# Patient Record
Sex: Female | Born: 1935 | Race: White | Hispanic: No | Smoking: Never smoker
Health system: Southern US, Community
[De-identification: ages and names within clinical notes are randomized; demographics above are authoritative.]

## PROBLEM LIST (undated history)

## (undated) DIAGNOSIS — I471 Supraventricular tachycardia, unspecified: Secondary | ICD-10-CM

## (undated) DIAGNOSIS — R32 Unspecified urinary incontinence: Secondary | ICD-10-CM

## (undated) DIAGNOSIS — I499 Cardiac arrhythmia, unspecified: Secondary | ICD-10-CM

## (undated) DIAGNOSIS — K224 Dyskinesia of esophagus: Secondary | ICD-10-CM

## (undated) DIAGNOSIS — I341 Nonrheumatic mitral (valve) prolapse: Secondary | ICD-10-CM

## (undated) DIAGNOSIS — H269 Unspecified cataract: Secondary | ICD-10-CM

## (undated) DIAGNOSIS — T8859XA Other complications of anesthesia, initial encounter: Secondary | ICD-10-CM

## (undated) DIAGNOSIS — K222 Esophageal obstruction: Secondary | ICD-10-CM

## (undated) DIAGNOSIS — T4145XA Adverse effect of unspecified anesthetic, initial encounter: Secondary | ICD-10-CM

## (undated) DIAGNOSIS — I493 Ventricular premature depolarization: Secondary | ICD-10-CM

## (undated) DIAGNOSIS — K56609 Unspecified intestinal obstruction, unspecified as to partial versus complete obstruction: Secondary | ICD-10-CM

## (undated) DIAGNOSIS — M810 Age-related osteoporosis without current pathological fracture: Secondary | ICD-10-CM

## (undated) DIAGNOSIS — D351 Benign neoplasm of parathyroid gland: Secondary | ICD-10-CM

## (undated) DIAGNOSIS — I4891 Unspecified atrial fibrillation: Secondary | ICD-10-CM

## (undated) DIAGNOSIS — K229 Disease of esophagus, unspecified: Secondary | ICD-10-CM

## (undated) DIAGNOSIS — D049 Carcinoma in situ of skin, unspecified: Secondary | ICD-10-CM

## (undated) DIAGNOSIS — M1990 Unspecified osteoarthritis, unspecified site: Secondary | ICD-10-CM

## (undated) DIAGNOSIS — E785 Hyperlipidemia, unspecified: Secondary | ICD-10-CM

## (undated) DIAGNOSIS — J189 Pneumonia, unspecified organism: Secondary | ICD-10-CM

## (undated) HISTORY — DX: Benign neoplasm of parathyroid gland: D35.1

## (undated) HISTORY — DX: Unspecified osteoarthritis, unspecified site: M19.90

## (undated) HISTORY — DX: Nonrheumatic mitral (valve) prolapse: I34.1

## (undated) HISTORY — DX: Unspecified atrial fibrillation: I48.91

## (undated) HISTORY — PX: OTHER SURGICAL HISTORY: SHX169

## (undated) HISTORY — DX: Unspecified intestinal obstruction, unspecified as to partial versus complete obstruction: K56.609

## (undated) HISTORY — DX: Hyperlipidemia, unspecified: E78.5

## (undated) HISTORY — DX: Unspecified urinary incontinence: R32

## (undated) HISTORY — DX: Carcinoma in situ of skin, unspecified: D04.9

## (undated) HISTORY — DX: Ventricular premature depolarization: I49.3

## (undated) HISTORY — DX: Supraventricular tachycardia, unspecified: I47.10

## (undated) HISTORY — DX: Unspecified cataract: H26.9

## (undated) HISTORY — DX: Esophageal obstruction: K22.2

## (undated) HISTORY — PX: CATARACT EXTRACTION: SUR2

## (undated) HISTORY — DX: Dyskinesia of esophagus: K22.4

## (undated) HISTORY — DX: Age-related osteoporosis without current pathological fracture: M81.0

## (undated) HISTORY — DX: Supraventricular tachycardia: I47.1

## (undated) HISTORY — DX: Disease of esophagus, unspecified: K22.9

## (undated) HISTORY — PX: EYE SURGERY: SHX253

---
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
## 1977-11-06 HISTORY — PX: BREAST BIOPSY: SHX20

## 1987-11-07 HISTORY — PX: ABDOMINAL HYSTERECTOMY: SHX81

## 1998-04-30 ENCOUNTER — Other Ambulatory Visit: Admission: RE | Admit: 1998-04-30 | Discharge: 1998-04-30 | Payer: Self-pay | Admitting: Obstetrics and Gynecology

## 1998-05-15 ENCOUNTER — Emergency Department (HOSPITAL_COMMUNITY): Admission: EM | Admit: 1998-05-15 | Discharge: 1998-05-15 | Payer: Self-pay | Admitting: Emergency Medicine

## 1998-11-06 DIAGNOSIS — K56609 Unspecified intestinal obstruction, unspecified as to partial versus complete obstruction: Secondary | ICD-10-CM

## 1998-11-06 HISTORY — PX: LAPAROSCOPIC SMALL BOWEL RESECTION: SUR793

## 1998-11-06 HISTORY — DX: Unspecified intestinal obstruction, unspecified as to partial versus complete obstruction: K56.609

## 1999-01-12 ENCOUNTER — Encounter: Admission: RE | Admit: 1999-01-12 | Discharge: 1999-02-24 | Payer: Self-pay | Admitting: Orthopedic Surgery

## 1999-02-05 HISTORY — PX: SHOULDER SURGERY: SHX246

## 1999-02-16 ENCOUNTER — Ambulatory Visit (HOSPITAL_BASED_OUTPATIENT_CLINIC_OR_DEPARTMENT_OTHER): Admission: RE | Admit: 1999-02-16 | Discharge: 1999-02-16 | Payer: Self-pay | Admitting: Orthopedic Surgery

## 1999-02-24 ENCOUNTER — Encounter: Admission: RE | Admit: 1999-02-24 | Discharge: 1999-04-29 | Payer: Self-pay | Admitting: Orthopedic Surgery

## 1999-06-10 ENCOUNTER — Emergency Department (HOSPITAL_COMMUNITY): Admission: EM | Admit: 1999-06-10 | Discharge: 1999-06-10 | Payer: Self-pay | Admitting: Emergency Medicine

## 1999-06-10 ENCOUNTER — Encounter: Payer: Self-pay | Admitting: Orthopedic Surgery

## 1999-08-07 HISTORY — PX: SKIN LESION EXCISION: SHX2412

## 1999-11-19 ENCOUNTER — Encounter: Payer: Self-pay | Admitting: Emergency Medicine

## 1999-11-20 ENCOUNTER — Inpatient Hospital Stay (HOSPITAL_COMMUNITY): Admission: EM | Admit: 1999-11-20 | Discharge: 1999-11-23 | Payer: Self-pay | Admitting: Emergency Medicine

## 2000-01-17 ENCOUNTER — Encounter: Payer: Self-pay | Admitting: Cardiology

## 2000-01-17 ENCOUNTER — Encounter: Admission: RE | Admit: 2000-01-17 | Discharge: 2000-01-17 | Payer: Self-pay | Admitting: Cardiology

## 2000-02-06 ENCOUNTER — Other Ambulatory Visit: Admission: RE | Admit: 2000-02-06 | Discharge: 2000-02-06 | Payer: Self-pay | Admitting: Obstetrics and Gynecology

## 2000-07-19 ENCOUNTER — Encounter: Admission: RE | Admit: 2000-07-19 | Discharge: 2000-07-19 | Payer: Self-pay | Admitting: Cardiology

## 2000-07-19 ENCOUNTER — Encounter: Payer: Self-pay | Admitting: Cardiology

## 2001-07-30 ENCOUNTER — Encounter: Admission: RE | Admit: 2001-07-30 | Discharge: 2001-07-30 | Payer: Self-pay | Admitting: Cardiology

## 2001-07-30 ENCOUNTER — Encounter: Payer: Self-pay | Admitting: Cardiology

## 2001-08-01 ENCOUNTER — Encounter: Payer: Self-pay | Admitting: Cardiology

## 2001-08-01 ENCOUNTER — Encounter: Admission: RE | Admit: 2001-08-01 | Discharge: 2001-08-01 | Payer: Self-pay | Admitting: Cardiology

## 2002-01-27 ENCOUNTER — Encounter: Payer: Self-pay | Admitting: Cardiology

## 2002-01-27 ENCOUNTER — Encounter: Admission: RE | Admit: 2002-01-27 | Discharge: 2002-01-27 | Payer: Self-pay | Admitting: Cardiology

## 2004-11-06 LAB — HM COLONOSCOPY

## 2005-04-13 ENCOUNTER — Encounter: Admission: RE | Admit: 2005-04-13 | Discharge: 2005-04-13 | Payer: Self-pay | Admitting: Gastroenterology

## 2005-06-21 ENCOUNTER — Encounter: Admission: RE | Admit: 2005-06-21 | Discharge: 2005-06-21 | Payer: Self-pay | Admitting: Gastroenterology

## 2005-10-26 ENCOUNTER — Encounter: Admission: RE | Admit: 2005-10-26 | Discharge: 2005-10-26 | Payer: Self-pay | Admitting: Cardiology

## 2007-06-03 ENCOUNTER — Encounter: Admission: RE | Admit: 2007-06-03 | Discharge: 2007-06-03 | Payer: Self-pay | Admitting: Cardiology

## 2007-07-01 ENCOUNTER — Encounter: Admission: RE | Admit: 2007-07-01 | Discharge: 2007-07-01 | Payer: Self-pay | Admitting: Cardiology

## 2007-08-08 ENCOUNTER — Ambulatory Visit (HOSPITAL_COMMUNITY): Admission: RE | Admit: 2007-08-08 | Discharge: 2007-08-09 | Payer: Self-pay | Admitting: *Deleted

## 2007-08-08 ENCOUNTER — Encounter (INDEPENDENT_AMBULATORY_CARE_PROVIDER_SITE_OTHER): Payer: Self-pay | Admitting: *Deleted

## 2008-07-21 ENCOUNTER — Encounter: Admission: RE | Admit: 2008-07-21 | Discharge: 2008-07-21 | Payer: Self-pay | Admitting: Cardiology

## 2009-07-27 ENCOUNTER — Encounter: Admission: RE | Admit: 2009-07-27 | Discharge: 2009-07-27 | Payer: Self-pay | Admitting: Cardiology

## 2010-05-20 ENCOUNTER — Encounter: Admission: RE | Admit: 2010-05-20 | Discharge: 2010-05-20 | Payer: Self-pay | Admitting: Cardiology

## 2010-08-18 ENCOUNTER — Encounter: Admission: RE | Admit: 2010-08-18 | Discharge: 2010-08-18 | Payer: Self-pay | Admitting: Cardiology

## 2010-11-06 LAB — HM MAMMOGRAPHY

## 2011-03-21 NOTE — Op Note (Signed)
Michelle Vazquez, Michelle Vazquez                 ACCOUNT NO.:  [id number]   MEDICAL RECORD NO.:  [id number]          PATIENT TYPE:  AMB   LOCATION:  SDS                          FACILITY:  [facility]   PHYSICIAN:  Alfonse Ras, MD   DATE OF BIRTH:  09-08-1929   DATE OF PROCEDURE:  08/08/2007  DATE OF DISCHARGE:                               OPERATIVE REPORT   PREOPERATIVE DIAGNOSIS:  Left inferior parathyroid adenoma.   POSTOPERATIVE DIAGNOSIS:  Left inferior parathyroid adenoma.   PROCEDURE:  Left neck exploration, excision of thyroid nodule, and left  parathyroidectomy.   ANESTHESIA:  General.   SURGEON:  Alfonse Ras, M.D.   ASSISTANT:  Velora Heckler, M.D.   DESCRIPTION:  The patient was taken to the operating room, placed in  supine position.  After nuclear medicine had been injected, the left  neck was prepped and draped in a normal sterile fashion and the neck was  hyperextended.  Using a left lateral low collar incision extending from  the midline laterally, I dissected down to the platysma muscle.  Flaps  were created both superiorly and inferiorly.  Strap muscles were then  opened at the midline.  The thyroid lobe was mobilized.  Using the  Neoprobe, the only area of high activity that we found was in the lower  inferior aspect of the left pole of the thyroid.  It appeared that there  was a small nodule there and it was excised and sent for frozen section,  which showed that to be thyroid tissue.  On further evaluation in the  cervical esophageal groove, very small parathyroid was identified and  was excised using blunt dissection.  Frozen section of this was  consistent with parathyroid adenoma.  Surgicel was placed in the area of  resection.  Recurrent laryngeal nerve was identified and spared.  The  strap muscles were closed with interrupted 3-0 Vicryl sutures.  The  platysma muscle was also closed with interrupted 3-0 Vicryl sutures.  Skin incisions were closed with staples.   Sterile dressing was applied.  The patient tolerated the procedure well and went to PACU in good  condition.      Alfonse Ras, MD  Electronically Signed     KRE/MEDQ  D:  08/08/2007  T:  08/08/2007  Job:  [id number]   cc:   Cassell Clement, M.D.

## 2011-03-24 NOTE — Op Note (Signed)
[name]  Patient:    Michelle Vazquez                         MRN: [id number] Proc. Date: 11/20/99 Adm. Date:  [id number] Attending:  Stephenie Acres                           Operative Report  PREOPERATIVE DIAGNOSIS:  Complete small-bowel obstruction.  POSTOPERATIVE DIAGNOSIS:  Complete small-bowel obstruction, single-band adhesion.  OPERATION:  Laparotomy with lysis of adhesions.  SURGEON:  Catalina Lunger, M.D.  ANESTHESIA:  General.  DESCRIPTION OF PROCEDURE:  The patient was taken to the operating room and placed in a supine position.  After adequate anesthesia was induced using endotracheal  tube, the abdomen was prepped and draped in normal sterile fashion.  Using a small vertical midline incision, I dissected down to the fascia.  The fascia was opened vertically.  On entering the peritoneum, a moderate amount of ascites was withdrawn.  A very dilated small bowel was identified with a transition point in the right lower quadrant.  There was a single-band adhesion to the retroperitoneum. This was lysed and the transition point brought up through the wound.  This was the single transition point.  The remainder of the small bowel was run.  No evidence of adhesive disease was found.  The fascia was closed with a running #1 Novofil, skin was closed with staples.  The patient tolerated the procedure well and went to ACU in good condition. DD:  11/20/99 TD:  11/20/99 Job: [id number]

## 2011-04-21 ENCOUNTER — Other Ambulatory Visit: Payer: Self-pay | Admitting: *Deleted

## 2011-06-13 ENCOUNTER — Encounter: Payer: Self-pay | Admitting: *Deleted

## 2011-06-21 ENCOUNTER — Encounter: Payer: Self-pay | Admitting: Cardiology

## 2011-06-21 ENCOUNTER — Ambulatory Visit (INDEPENDENT_AMBULATORY_CARE_PROVIDER_SITE_OTHER): Payer: Medicare Other | Admitting: *Deleted

## 2011-06-21 ENCOUNTER — Ambulatory Visit (INDEPENDENT_AMBULATORY_CARE_PROVIDER_SITE_OTHER): Payer: Medicare Other | Admitting: Cardiology

## 2011-06-21 VITALS — BP 130/80 | HR 85 | Wt 105.0 lb

## 2011-06-21 DIAGNOSIS — R1310 Dysphagia, unspecified: Secondary | ICD-10-CM | POA: Insufficient documentation

## 2011-06-21 DIAGNOSIS — Z79899 Other long term (current) drug therapy: Secondary | ICD-10-CM

## 2011-06-21 DIAGNOSIS — D351 Benign neoplasm of parathyroid gland: Secondary | ICD-10-CM

## 2011-06-21 DIAGNOSIS — I4949 Other premature depolarization: Secondary | ICD-10-CM

## 2011-06-21 DIAGNOSIS — I059 Rheumatic mitral valve disease, unspecified: Secondary | ICD-10-CM

## 2011-06-21 DIAGNOSIS — E785 Hyperlipidemia, unspecified: Secondary | ICD-10-CM

## 2011-06-21 DIAGNOSIS — I493 Ventricular premature depolarization: Secondary | ICD-10-CM | POA: Insufficient documentation

## 2011-06-21 DIAGNOSIS — Z86018 Personal history of other benign neoplasm: Secondary | ICD-10-CM | POA: Insufficient documentation

## 2011-06-21 DIAGNOSIS — I341 Nonrheumatic mitral (valve) prolapse: Secondary | ICD-10-CM

## 2011-06-21 DIAGNOSIS — R5383 Other fatigue: Secondary | ICD-10-CM

## 2011-06-21 LAB — CBC WITH DIFFERENTIAL/PLATELET
Eosinophils Absolute: 0.3 10*3/uL (ref 0.0–0.7)
Eosinophils Relative: 5.1 % — ABNORMAL HIGH (ref 0.0–5.0)
HCT: 42.3 % (ref 36.0–46.0)
Lymphocytes Relative: 46 % (ref 12.0–46.0)
MCHC: 33.3 g/dL (ref 30.0–36.0)
MCV: 94.2 fl (ref 78.0–100.0)
Monocytes Relative: 8.7 % (ref 3.0–12.0)
Neutrophils Relative %: 39.2 % — ABNORMAL LOW (ref 43.0–77.0)
Platelets: 262 10*3/uL (ref 150.0–400.0)
RDW: 13.5 % (ref 11.5–14.6)

## 2011-06-21 LAB — LIPID PANEL
HDL: 76.4 mg/dL (ref >39.00)
Total CHOL/HDL Ratio: 3
Triglycerides: 64 mg/dL (ref 0.0–149.0)

## 2011-06-21 LAB — BASIC METABOLIC PANEL
Calcium: 9.2 mg/dL (ref 8.4–10.5)
GFR: 67.54 mL/min (ref >60.00)
Potassium: 4.8 mEq/L (ref 3.5–5.1)

## 2011-06-21 LAB — HEPATIC FUNCTION PANEL
ALT: 14 U/L (ref 0–35)
AST: 21 U/L (ref 0–37)
Alkaline Phosphatase: 49 U/L (ref 39–117)
Bilirubin, Direct: 0.1 mg/dL (ref 0.0–0.3)
Total Bilirubin: 0.9 mg/dL (ref 0.3–1.2)
Total Protein: 7.2 g/dL (ref 6.0–8.3)

## 2011-06-21 NOTE — Patient Instructions (Signed)
We will schedule Ba swallow and Chest xray

## 2011-06-21 NOTE — Assessment & Plan Note (Signed)
The patient has a long history of unifocal PVCs.  Today she is demonstrating PVCs in quadrigeminy.  This is similar to one year ago.  She is aware of the premature beats.  She has not been having a sustained tachycardia.  She's had no dizziness or syncope.  She has not been expressing any chest pain.

## 2011-06-21 NOTE — Assessment & Plan Note (Signed)
The patient has a history of difficulty swallowing.  She has had these symptoms off and on dating back to 2003.  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  There was no evidence of any stricture at that time.

## 2011-06-21 NOTE — Assessment & Plan Note (Signed)
The patient has a history of mitral valve prolapse.  She has not been expressing any symptoms of congestive heart failure.  She does have mild mitral regurgitation.

## 2011-06-21 NOTE — Progress Notes (Signed)
Dominic Pea Date of Birth:  [birth year] [facility] [address]   [address] [place], [place]  [address] [phone]           Fax   [phone]  History of Present Illness: This pleasant 77 year old Caucasian female is seen for a one year followup office visit.  She has a past history of mitral valve prolapse.  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  In 2008 she underwent parathyroidectomy for parathyroid adenoma.  Subsequent serum calcium levels have been normal she has not been expressing any chest pain or angina.  She does not have any history of ischemic heart disease and she had a normal nuclear stress test on 06/19/07 which showed no evidence of ischemia and her ejection fraction was 46%.  Her last echocardiogram was 03/17/09 showed an ejection fraction of 55-60% and showed normal left ventricular systolic function with diastolic dysfunction, mild aortic sclerosis, mitral valve prolapse with mild mitral regurgitation, and normal pulmonary artery pressure.  The patient has a past history of difficulty swallowing.  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  2006 she saw Dr. Randa Evens who repeated the time swallowing and also did a colonoscopy.  The colonoscopy was incomplete because of strictures and diverticulitis.  Recently the patient is started to have problems with difficulty swallowing again.  She requests another barium swallow at [facility].  Current Outpatient Prescriptions  Medication Sig Dispense Refill  . calcium gluconate 500 MG tablet Take 500 mg by mouth daily.        . metoprolol succinate (TOPROL-XL) 25 MG 24 hr tablet Take 25 mg by mouth daily.        . multivitamin (THERAGRAN) per tablet Take 1 tablet by mouth daily.          Allergies  Allergen Reactions  . Inderal (Propranolol Hcl)   . Lopressor (Metoprolol Tartrate)   . Penicillins     There is no problem list on file for this patient.   History  Smoking  status  . Never Smoker   Smokeless tobacco  . Never Used    History  Alcohol Use No    Family History  Problem Relation Age of Onset  . Arthritis Father   . Heart disease Father   . Hypertension Father   . Stroke Father   . Heart attack Father   . Arthritis Mother   . Stroke Mother   . Stroke Brother   . Diabetes Brother   . Cancer Brother   . Hypertension Brother   . Cancer Sister   . Hypertension Sister   . Hypertension Sister     Review of Systems: Constitutional: no fever chills diaphoresis or fatigue or change in weight.  Head and neck: no hearing loss, no epistaxis, no photophobia or visual disturbance. Respiratory: No cough, shortness of breath or wheezing. Cardiovascular: No chest pain peripheral edema, palpitations. Gastrointestinal: No abdominal distention, no abdominal pain, no change in bowel habits hematochezia or melena. Genitourinary: No dysuria, no frequency, no urgency, no nocturia. Musculoskeletal:No arthralgias, no back pain, no gait disturbance or myalgias. Neurological: No dizziness, no headaches, no numbness, no seizures, no syncope, no weakness, no tremors. Hematologic: No lymphadenopathy, no easy bruising. Psychiatric: No confusion, no hallucinations, no sleep disturbance.    Physical Exam: Filed Vitals:   06/21/11 0931  BP: 130/80  Pulse: 85  The general appearance reveals a thin woman in no acute distress.Pupils equal and reactive.  Extraocular Movements are full.  There is no scleral icterus.  The mouth and pharynx are normal.  The neck is supple.  The carotids reveal no bruits.  The jugular venous pressure is normal.  The thyroid is not enlarged.  There is no lymphadenopathy.  The chest is clear to percussion and auscultation. There are no rales or rhonchi. Expansion of the chest is symmetrical.    The heart reveals a soft apical systolic murmur.  The rhythm is irregular because of PVCs.  The breasts reveal no masses. The abdomen is  soft and nontender. Bowel sounds are normal. The liver and spleen are not enlarged. There Are no abdominal masses. There are no bruits.Rectal exam reveals brown stool benzidine negative.The pedal pulses are good.  There is no phlebitis or edema.  There is no cyanosis or clubbing.  Strength is normal and symmetrical in all extremities.  There is no lateralizing weakness.  There are no sensory deficits.  The skin is warm and dry.  There is no rash.    EKG today shows normal sinus rhythm with frequent PVCs in quadrigeminy.  Otherwise the tracing is normal with poor R-wave progression V1 through V3.  No change from prior tracings.     Assessment / Plan:  Her PVCs do not require any specific therapy.  We're checking lab work today to rule out hypokalemia et [employer]  We will arrange for her to have a chest x-ray and a barium swallow it [facility] imaging.  Continue same medications.  Recheck in one year for office visit and fasting lab work including lipid panel hepatic function panel basal metabolic panel and CBC.We're also asked her to find herself a primary care provider But we assured her that we would continue to look after her heart for her

## 2011-06-21 NOTE — Assessment & Plan Note (Signed)
Patient has a history of a parathyroid adenoma which was removed in 2008.  We're checking serum calcium levels on her today.  She has not had any symptoms referable to the parathyroid surgery.

## 2011-06-22 ENCOUNTER — Encounter: Payer: Self-pay | Admitting: Cardiology

## 2011-06-22 ENCOUNTER — Telehealth: Payer: Self-pay | Admitting: *Deleted

## 2011-06-22 NOTE — Telephone Encounter (Signed)
Recheck fasting lipid panel in 3 months

## 2011-06-22 NOTE — Telephone Encounter (Signed)
Patient would like to work on diet re-her sweets before starting any medicines. When to re-check lab?

## 2011-06-22 NOTE — Telephone Encounter (Signed)
Message copied by Eugenia Pancoast on Thu Jun 22, 2011  4:55 PM ------      Message from: Cassell Clement      Created: Wed Jun 21, 2011  8:07 PM       CBC normal.  LDL cholesterol 124 too high.Liver and kidneys are nl.      Stay on prudent diet.  Add generic Lipitor 20 mg daily to bring down dholesterol..Recheck LP and HFP 2 Mo

## 2011-06-23 ENCOUNTER — Telehealth: Payer: Self-pay | Admitting: *Deleted

## 2011-06-23 NOTE — Telephone Encounter (Signed)
Advised spouse to have patient call to resch. Lab work in three months

## 2011-06-26 ENCOUNTER — Ambulatory Visit
Admission: RE | Admit: 2011-06-26 | Discharge: 2011-06-26 | Disposition: A | Discharge status: Home / Self Care | Payer: PRIVATE HEALTH INSURANCE | Source: Ambulatory Visit | Attending: Cardiology | Admitting: Cardiology

## 2011-06-26 ENCOUNTER — Ambulatory Visit
Admission: RE | Admit: 2011-06-26 | Discharge: 2011-06-26 | Disposition: A | Discharge status: Home / Self Care | Payer: Medicare Other | Source: Ambulatory Visit | Attending: Cardiology | Admitting: Cardiology

## 2011-06-26 DIAGNOSIS — R1310 Dysphagia, unspecified: Secondary | ICD-10-CM

## 2011-06-26 DIAGNOSIS — I493 Ventricular premature depolarization: Secondary | ICD-10-CM

## 2011-06-27 ENCOUNTER — Telehealth: Payer: Self-pay | Admitting: *Deleted

## 2011-06-27 DIAGNOSIS — R002 Palpitations: Secondary | ICD-10-CM

## 2011-06-27 MED ORDER — METOPROLOL SUCCINATE ER 25 MG PO TB24: 25.0000 mg | ORAL_TABLET | Freq: Every day | ORAL | Status: DC

## 2011-06-27 NOTE — Telephone Encounter (Signed)
Message copied by Burnell Blanks on Tue Jun 27, 2011  1:55 PM ------      Message from: Cassell Clement      Created: Tue Jun 27, 2011 10:12 AM       Please report.  The chest x-ray is normal.The heart size is upper limits of normal.  The lungs are clear.

## 2011-06-27 NOTE — Progress Notes (Signed)
Advised of results

## 2011-06-27 NOTE — Progress Notes (Signed)
Advised of results and will refer

## 2011-06-29 ENCOUNTER — Telehealth: Payer: Self-pay | Admitting: Cardiology

## 2011-06-29 NOTE — Telephone Encounter (Signed)
Wrote up Rx for shingles vaccine

## 2011-06-29 NOTE — Telephone Encounter (Signed)
Advised of xray and barium swallow.  Order will be faxed to outpatient for therapy to be scheduled

## 2011-06-29 NOTE — Telephone Encounter (Signed)
Pt states she needs a script for a "shingles shot" for [facility].  Please call pt back for further details.  Chart in box

## 2011-07-18 ENCOUNTER — Ambulatory Visit: Payer: Medicare Other | Attending: Cardiology | Admitting: *Deleted

## 2011-07-18 DIAGNOSIS — IMO0001 Reserved for inherently not codable concepts without codable children: Secondary | ICD-10-CM | POA: Insufficient documentation

## 2011-07-18 DIAGNOSIS — R1310 Dysphagia, unspecified: Secondary | ICD-10-CM | POA: Insufficient documentation

## 2011-08-17 LAB — BASIC METABOLIC PANEL
BUN: 12
Calcium: 10.5
Chloride: 105
Creatinine, Ser: 0.88
GFR calc Af Amer: >60
GFR calc non Af Amer: >60
Glucose, Bld: 105 — ABNORMAL HIGH
Potassium: 4.4

## 2011-08-17 LAB — CBC
Hemoglobin: 13
MCHC: 33.8
MCV: 93.1
RBC: 4.14
RDW: 13.1

## 2011-08-17 LAB — CALCIUM: Calcium: 8.5

## 2011-08-30 ENCOUNTER — Other Ambulatory Visit: Payer: Self-pay | Admitting: Cardiology

## 2011-08-30 DIAGNOSIS — Z1231 Encounter for screening mammogram for malignant neoplasm of breast: Secondary | ICD-10-CM

## 2011-09-19 ENCOUNTER — Ambulatory Visit
Admission: RE | Admit: 2011-09-19 | Discharge: 2011-09-19 | Disposition: A | Discharge status: Home / Self Care | Payer: Medicare Other | Source: Ambulatory Visit | Attending: Cardiology | Admitting: Cardiology

## 2011-09-19 DIAGNOSIS — Z1231 Encounter for screening mammogram for malignant neoplasm of breast: Secondary | ICD-10-CM

## 2012-02-14 ENCOUNTER — Emergency Department (HOSPITAL_COMMUNITY)
Admission: EM | Admit: 2012-02-14 | Discharge: 2012-02-14 | Disposition: A | Discharge status: Home / Self Care | Payer: Medicare Other | Attending: Emergency Medicine | Admitting: Emergency Medicine

## 2012-02-14 ENCOUNTER — Encounter (HOSPITAL_COMMUNITY): Payer: Self-pay | Admitting: Emergency Medicine

## 2012-02-14 DIAGNOSIS — Y92009 Unspecified place in unspecified non-institutional (private) residence as the place of occurrence of the external cause: Secondary | ICD-10-CM | POA: Insufficient documentation

## 2012-02-14 DIAGNOSIS — W540XXA Bitten by dog, initial encounter: Secondary | ICD-10-CM

## 2012-02-14 DIAGNOSIS — S51809A Unspecified open wound of unspecified forearm, initial encounter: Secondary | ICD-10-CM | POA: Diagnosis not present

## 2012-02-14 DIAGNOSIS — S61409A Unspecified open wound of unspecified hand, initial encounter: Secondary | ICD-10-CM | POA: Diagnosis not present

## 2012-02-14 MED ORDER — CIPROFLOXACIN HCL 500 MG PO TABS
500.0000 mg | ORAL_TABLET | Freq: Once | ORAL | Status: AC
  Administered 2012-02-14: 500 mg via ORAL
  Filled 2012-02-14: qty 1

## 2012-02-14 MED ORDER — BACITRACIN ZINC 500 UNIT/GM EX OINT
1.0000 "application " | TOPICAL_OINTMENT | Freq: Once | CUTANEOUS | Status: AC
  Administered 2012-02-14: 4 via TOPICAL
  Filled 2012-02-14: qty 0.9

## 2012-02-14 MED ORDER — CIPROFLOXACIN HCL 500 MG PO TABS: 500.0000 mg | ORAL_TABLET | Freq: Two times a day (BID) | ORAL | Status: AC

## 2012-02-14 MED ORDER — CLINDAMYCIN HCL 300 MG PO CAPS: 300.0000 mg | ORAL_CAPSULE | Freq: Four times a day (QID) | ORAL | Status: AC

## 2012-02-14 MED ORDER — CLINDAMYCIN HCL 300 MG PO CAPS
300.0000 mg | ORAL_CAPSULE | Freq: Once | ORAL | Status: AC
  Administered 2012-02-14: 300 mg via ORAL
  Filled 2012-02-14: qty 1

## 2012-02-14 MED ORDER — BACITRACIN ZINC 500 UNIT/GM EX OINT
TOPICAL_OINTMENT | CUTANEOUS | Status: AC
  Administered 2012-02-14: 4 via TOPICAL
  Filled 2012-02-14: qty 0.9

## 2012-02-14 NOTE — ED Notes (Signed)
Pt states her beagle was in the trash and she was trying to get it away and it bit her on the right posterior forearm  Pt states dog is hers and is up to date on all its shots  Pt has an area about the size of a half dollar where skin and some tissue are gone  Dressing applied in triage  Bleeding controlled

## 2012-02-14 NOTE — Discharge Instructions (Signed)
Animal Bite  An animal bite can result in a scratch on the skin, deep open cut, puncture of the skin, crush injury, or tearing away of the skin or a body part. Dogs are responsible for most animal bites. Children are bitten more often than adults. An animal bite can range from very mild to more serious. A small bite from your house pet is no cause for alarm. However, some animal bites can become infected or injure a bone or other tissue. You must seek medical care if:  · The skin is broken and bleeding does not slow down or stop after 15 minutes.  · The puncture is deep and difficult to clean (such as a cat bite).  · Pain, warmth, redness, or pus develops around the wound.  · The bite is from a stray animal or rodent. There may be a risk of rabies infection.  · The bite is from a snake, raccoon, skunk, fox, coyote, or bat. There may be a risk of rabies infection.  · The person bitten has a chronic illness such as diabetes, liver disease, or cancer, or the person takes medicine that lowers the immune system.  · There is concern about the location and severity of the bite.  It is important to clean and protect an animal bite wound right away to prevent infection. Follow these steps:  · Clean the wound with plenty of water and soap.  · Apply an antibiotic cream.  · Apply gentle pressure over the wound with a clean towel or gauze to slow or stop bleeding.  · Elevate the affected area above the heart to help stop any bleeding.  · Seek medical care. Getting medical care within 8 hours of the animal bite leads to the best possible outcome.  DIAGNOSIS   Your caregiver will most likely:  · Take a detailed history of the animal and the bite injury.  · Perform a wound exam.  · Take your medical history.  Blood tests or X-rays may be performed. Sometimes, infected bite wounds are cultured and sent to a lab to identify the infectious bacteria.   TREATMENT   Medical treatment will depend on the location and type of animal bite as  well as the patient's medical history. Treatment may include:  · Wound care, such as cleaning and flushing the wound with saline solution, bandaging, and elevating the affected area.  · Antibiotics.  · Tetanus immunization.  · Rabies immunization.  · Leaving the wound open to heal. This is often done with animal bites, due to the high risk of infection. However, in certain cases, wound closure with stitches, wound adhesive, skin adhesive strips, or staples may be used.   Infected bites that are left untreated may require intravenous (IV) antibiotics and surgical treatment in the hospital.  HOME CARE INSTRUCTIONS  · Follow your caregiver's instructions for wound care.  · Take all medicines as directed.  · If your caregiver prescribes antibiotics, take them as directed. Finish them even if you start to feel better.  · Follow up with your caregiver for further exams or immunizations as directed.  You may need a tetanus shot if:  · You cannot remember when you had your last tetanus shot.  · You have never had a tetanus shot.  · The injury broke your skin.  If you get a tetanus shot, your arm may swell, get red, and feel warm to the touch. This is common and not a problem. If you need a tetanus   shot and you choose not to have one, there is a rare chance of getting tetanus. Sickness from tetanus can be serious.  SEEK MEDICAL CARE IF:  · You notice warmth, redness, soreness, swelling, pus discharge, or a bad smell coming from the wound.  · You have a red line on the skin coming from the wound.  · You have a fever, chills, or a general ill feeling.  · You have nausea or vomiting.  · You have continued or worsening pain.  · You have trouble moving the injured part.  · You have other questions or concerns.  MAKE SURE YOU:  · Understand these instructions.  · Will watch your condition.  · Will get help right away if you are not doing well or get worse.  Document Released: 07/11/2011 Document Revised: 10/12/2011 Document  Reviewed: 07/11/2011  [employer]® Patient Information ©2012 [employer].

## 2012-02-14 NOTE — ED Provider Notes (Signed)
History     CSN: [id number]  Arrival date & time 02/14/12  1478   First MD Initiated Contact with Patient 02/14/12 2139      Chief Complaint  Patient presents with  . Animal Bite    (Consider location/radiation/quality/duration/timing/severity/associated sxs/prior treatment) HPI Patient presents immediately after suffering a dog bite.  She notes that she was trying to take a piece of food from her Beagle and was bitten.  Since that time she's had been persistently about her mid dorsal forearm.  Pain is sharp.  No attempts at relief thus far.  No other injuries. Past Medical History  Diagnosis Date  . Mitral valve prolapse   . Parathyroid adenoma     post parathyroid surgery in October of 2008  . Heart palpitations   . Premature ventricular contractions (PVCs) (VPCs)   . Paroxysmal supraventricular tachycardia   . History of hysterectomy 03/1988    Past Surgical History  Procedure Date  . Shoulder surgery 02/1999    right  . Skin lesion excision 08/1999    left shoulder  . Laparoscopic small bowel resection   . Breast biopsy 1979  . Abdominal hysterectomy   . Parathyroid surgery   . Cataract extraction     Family History  Problem Relation Age of Onset  . Arthritis Father   . Heart disease Father   . Hypertension Father   . Stroke Father   . Heart attack Father   . Arthritis Mother   . Stroke Mother   . Stroke Brother   . Diabetes Brother   . Cancer Brother   . Hypertension Brother   . Cancer Sister   . Hypertension Sister   . Hypertension Sister     History  Substance Use Topics  . Smoking status: Never Smoker   . Smokeless tobacco: Never Used  . Alcohol Use: No    OB History    Grav Para Term Preterm Abortions TAB SAB Ect Mult Living                  Review of Systems  All other systems reviewed and are negative.    Allergies  Inderal; Lopressor; and Penicillins  Home Medications   Current Outpatient Rx  Name Route Sig Dispense Refill    . CALCIUM GLUCONATE 500 MG PO TABS Oral Take 500 mg by mouth daily.      Marland Kitchen METOPROLOL SUCCINATE ER 25 MG PO TB24 Oral Take 1 tablet (25 mg total) by mouth daily. 90 tablet 3  . MULTIVITAMINS PO TABS Oral Take 1 tablet by mouth daily.      Marland Kitchen CIPROFLOXACIN HCL 500 MG PO TABS Oral Take 1 tablet (500 mg total) by mouth 2 (two) times daily. 14 tablet 0  . CLINDAMYCIN HCL 300 MG PO CAPS Oral Take 1 capsule (300 mg total) by mouth 4 (four) times daily. 28 capsule 0    BP 154/68  Pulse 44  Temp(Src) 97.1 F (36.2 C) (Oral)  Resp 18  SpO2 99%  Physical Exam  Nursing note and vitals reviewed. Constitutional: She is oriented to person, place, and time. She appears well-developed and well-nourished.  HENT:  Head: Normocephalic and atraumatic.  Eyes: Conjunctivae and EOM are normal.  Cardiovascular: Normal rate, regular rhythm and intact distal pulses.   Pulmonary/Chest: Effort normal. No stridor.  Musculoskeletal:       Right elbow: Normal.      Right wrist: Normal.       Arms: Neurological: She is  alert and oriented to person, place, and time.  Skin: Skin is warm and dry.  Psychiatric: She has a normal mood and affect.    ED Course  Procedures (including critical care time)  Labs Reviewed - No data to display No results found.   1. Dog bite     11:10 PM The wound was copiously irrigated, and reevaluated.  On repeat evaluation through the full range of motion of the wrist there is no deep tendon visibility, nor any pain noted in the distal extremity.  MDM  This generally well 78-year-old female presents with wound from a dog bite.  On exam she is in no distress, with unremarkable vital signs aside from mild tachycardia.  The wound is full dermal depth, rough, but does not seem to involve any of the tendons or deeper structures.  The wound was copiously irrigated.  I discussed is a return precautions, and the need for wound check in 2 days with the patient.  She was discharged in  stable condition.     Gerhard Munch, MD 02/14/12 2311

## 2012-02-16 ENCOUNTER — Emergency Department (HOSPITAL_COMMUNITY)
Admission: EM | Admit: 2012-02-16 | Discharge: 2012-02-16 | Disposition: A | Discharge status: Home / Self Care | Payer: Medicare Other | Attending: Emergency Medicine | Admitting: Emergency Medicine

## 2012-02-16 ENCOUNTER — Encounter (HOSPITAL_COMMUNITY): Payer: Self-pay | Admitting: Emergency Medicine

## 2012-02-16 DIAGNOSIS — I4949 Other premature depolarization: Secondary | ICD-10-CM | POA: Insufficient documentation

## 2012-02-16 DIAGNOSIS — I059 Rheumatic mitral valve disease, unspecified: Secondary | ICD-10-CM | POA: Insufficient documentation

## 2012-02-16 DIAGNOSIS — Z4801 Encounter for change or removal of surgical wound dressing: Secondary | ICD-10-CM | POA: Diagnosis not present

## 2012-02-16 DIAGNOSIS — W540XXA Bitten by dog, initial encounter: Secondary | ICD-10-CM

## 2012-02-16 DIAGNOSIS — S51809A Unspecified open wound of unspecified forearm, initial encounter: Secondary | ICD-10-CM | POA: Diagnosis not present

## 2012-02-16 DIAGNOSIS — IMO0001 Reserved for inherently not codable concepts without codable children: Secondary | ICD-10-CM

## 2012-02-16 MED ORDER — BACITRACIN 500 UNIT/GM EX OINT
1.0000 "application " | TOPICAL_OINTMENT | Freq: Two times a day (BID) | CUTANEOUS | Status: DC
  Administered 2012-02-16: 1 via TOPICAL
  Filled 2012-02-16 (×2): qty 0.9

## 2012-02-16 NOTE — ED Provider Notes (Signed)
Medical screening examination/treatment/procedure(s) were conducted as a shared visit with non-physician practitioner(s) and myself.  I personally evaluated the patient during the encounter  The wound appears to be healing well. No signs of infection  Lyanne Co, MD 02/16/12 [phone]

## 2012-02-16 NOTE — ED Notes (Signed)
Follow up with r/wrist injury, due to dog bite

## 2012-02-16 NOTE — ED Provider Notes (Signed)
History     CSN: [id number]  Arrival date & time 02/16/12  1008   First MD Initiated Contact with Patient 02/16/12 1035      Chief Complaint  Patient presents with  . Wound Check    dog bite r/wrist    (Consider location/radiation/quality/duration/timing/severity/associated sxs/prior treatment) Patient is a 78 y.o. female presenting with wound check. The history is provided by the patient.  Wound Check  Treated in ED: 2 days ago. Previous treatment in the ED includes wound cleansing or irrigation and oral antibiotics. Treatments since wound repair include oral antibiotics. Fever duration: no fever. Wound drainage status: small amt bloody drainage on dressing when changed. There is no redness present. There is no swelling present. The pain has improved. She has no difficulty moving the affected extremity or digit.  Bite from her own dog to right forearm.   Past Medical History  Diagnosis Date  . Mitral valve prolapse   . Parathyroid adenoma     post parathyroid surgery in October of 2008  . Heart palpitations   . Premature ventricular contractions (PVCs) (VPCs)   . Paroxysmal supraventricular tachycardia   . History of hysterectomy 03/1988    Past Surgical History  Procedure Date  . Shoulder surgery 02/1999    right  . Skin lesion excision 08/1999    left shoulder  . Laparoscopic small bowel resection   . Breast biopsy 1979  . Abdominal hysterectomy   . Parathyroid surgery   . Cataract extraction     Family History  Problem Relation Age of Onset  . Arthritis Father   . Heart disease Father   . Hypertension Father   . Stroke Father   . Heart attack Father   . Arthritis Mother   . Stroke Mother   . Stroke Brother   . Diabetes Brother   . Cancer Brother   . Hypertension Brother   . Cancer Sister   . Hypertension Sister   . Hypertension Sister     History  Substance Use Topics  . Smoking status: Never Smoker   . Smokeless tobacco: Never Used  . Alcohol  Use: No     Review of Systems  Constitutional: Negative for fever and chills.  Skin: Positive for wound. Negative for color change.  Neurological: Negative for weakness and numbness.    Allergies  Inderal; Lopressor; and Penicillins  Home Medications   Current Outpatient Rx  Name Route Sig Dispense Refill  . CALCIUM GLUCONATE 500 MG PO TABS Oral Take 500 mg by mouth daily.      Marland Kitchen CIPROFLOXACIN HCL 500 MG PO TABS Oral Take 1 tablet (500 mg total) by mouth 2 (two) times daily. 14 tablet 0  . CLINDAMYCIN HCL 300 MG PO CAPS Oral Take 1 capsule (300 mg total) by mouth 4 (four) times daily. 28 capsule 0  . METOPROLOL SUCCINATE ER 25 MG PO TB24 Oral Take 1 tablet (25 mg total) by mouth daily. 90 tablet 3  . MULTIVITAMINS PO TABS Oral Take 1 tablet by mouth daily.        BP 132/53  Pulse 81  Temp 98 F (36.7 C)  Resp 18  SpO2 98%  Physical Exam  Nursing note and vitals reviewed. Constitutional: She is oriented to person, place, and time. She appears well-developed and well-nourished. No distress.  HENT:  Head: Normocephalic and atraumatic.  Eyes: Right eye exhibits no discharge. Left eye exhibits no discharge.  Cardiovascular: Normal rate, regular rhythm and intact distal pulses.  Pulmonary/Chest: Effort normal. No respiratory distress.  Musculoskeletal: Normal range of motion. She exhibits no edema and no tenderness.       All digits with brisk capillary refill  Neurological: She is alert and oriented to person, place, and time.       Sensation intact to light touch and symmetric in BUE  Skin: Skin is warm and dry.       ED Course  Procedures (including critical care time)  Labs Reviewed - No data to display No results found.   Dx 1: Wound recheck   MDM  Well-healing 9-day-old wound. Advised continued po abx, daily wound care, recheck if needed. Discussed wound healing expectations with pt, who voices understanding.        Shaaron Adler,  PA-C 02/16/12 1104

## 2012-02-16 NOTE — Discharge Instructions (Signed)
Animal Bite An animal bite can result in a scratch on the skin, deep open cut, puncture of the skin, crush injury, or tearing away of the skin or a body part. Dogs are responsible for most animal bites. Children are bitten more often than adults. An animal bite can range from very mild to more serious. A small bite from your house pet is no cause for alarm. However, some animal bites can become infected or injure a bone or other tissue. You must seek medical care if:  The skin is broken and bleeding does not slow down or stop after 15 minutes.   The puncture is deep and difficult to clean (such as a cat bite).   Pain, warmth, redness, or pus develops around the wound.   The bite is from a stray animal or rodent. There may be a risk of rabies infection.   The bite is from a snake, raccoon, skunk, fox, coyote, or bat. There may be a risk of rabies infection.   The person bitten has a chronic illness such as diabetes, liver disease, or cancer, or the person takes medicine that lowers the immune system.   There is concern about the location and severity of the bite.  It is important to clean and protect an animal bite wound right away to prevent infection. Follow these steps:  Clean the wound with plenty of water and soap.   Apply an antibiotic cream.   Apply gentle pressure over the wound with a clean towel or gauze to slow or stop bleeding.   Elevate the affected area above the heart to help stop any bleeding.   Seek medical care. Getting medical care within 8 hours of the animal bite leads to the best possible outcome.  DIAGNOSIS  Your caregiver will most likely:  Take a detailed history of the animal and the bite injury.   Perform a wound exam.   Take your medical history.  Blood tests or X-rays may be performed. Sometimes, infected bite wounds are cultured and sent to a lab to identify the infectious bacteria.  TREATMENT  Medical treatment will depend on the location and type of  animal bite as well as the patient's medical history. Treatment may include:  Wound care, such as cleaning and flushing the wound with saline solution, bandaging, and elevating the affected area.   Antibiotics.   Tetanus immunization.   Rabies immunization.   Leaving the wound open to heal. This is often done with animal bites, due to the high risk of infection. However, in certain cases, wound closure with stitches, wound adhesive, skin adhesive strips, or staples may be used.  Infected bites that are left untreated may require intravenous (IV) antibiotics and surgical treatment in the hospital. HOME CARE INSTRUCTIONS  Follow your caregiver's instructions for wound care.   Take all medicines as directed.   If your caregiver prescribes antibiotics, take them as directed. Finish them even if you start to feel better.   Follow up with your caregiver for further exams or immunizations as directed.  You may need a tetanus shot if:  You cannot remember when you had your last tetanus shot.   You have never had a tetanus shot.   The injury broke your skin.  If you get a tetanus shot, your arm may swell, get red, and feel warm to the touch. This is common and not a problem. If you need a tetanus shot and you choose not to have one, there is a   rare chance of getting tetanus. Sickness from tetanus can be serious. SEEK MEDICAL CARE IF:  You notice warmth, redness, soreness, swelling, pus discharge, or a bad smell coming from the wound.   You have a red line on the skin coming from the wound.   You have a fever, chills, or a general ill feeling.   You have nausea or vomiting.   You have continued or worsening pain.   You have trouble moving the injured part.   You have other questions or concerns.  MAKE SURE YOU:  Understand these instructions.   Will watch your condition.   Will get help right away if you are not doing well or get worse.  Document Released: 07/11/2011  Document Revised: 10/12/2011 Document Reviewed: 07/11/2011 [facility] Patient Information 2012 [place], [place].        Dressing Change Dressings are placed over wounds to keep them clean, dry, and protected from further injury. This provides an environment that favors wound healing. Good wound care includes resting and elevating the injured part until the pain and swelling are better. Change your wound dressing as recommended by your caregiver. When removing an old dressing, lift it slowly away from the wound. If the dressing sticks to the wound, dampen it with half-strength peroxide or tap water. Clean the wound gently with a moist cloth, remove any loose material, and apply antibiotic ointment if recommended by your caregiver. Usually it is okay for a wound to get wet. Wash it with mildly soapy water. Watch for signs of infection when changing a dressing. SEEK MEDICAL CARE IF:  You develop increased pain, redness, or swelling.   You have pus-like drainage from the wound.   You develop a fever greater than 100.4 F (38 C).  Document Released: 11/30/2004 Document Revised: 10/12/2011 Document Reviewed: 09/04/2011 [facility] Patient Information 2012 [place], [place].

## 2012-06-21 ENCOUNTER — Encounter: Payer: Self-pay | Admitting: Internal Medicine

## 2012-06-21 ENCOUNTER — Ambulatory Visit (INDEPENDENT_AMBULATORY_CARE_PROVIDER_SITE_OTHER): Payer: Medicare Other | Admitting: Internal Medicine

## 2012-06-21 ENCOUNTER — Other Ambulatory Visit (INDEPENDENT_AMBULATORY_CARE_PROVIDER_SITE_OTHER): Payer: Medicare Other

## 2012-06-21 VITALS — BP 122/70 | HR 43 | Temp 98.0°F | Ht 63.0 in | Wt 106.0 lb

## 2012-06-21 DIAGNOSIS — Z0000 Encounter for general adult medical examination without abnormal findings: Secondary | ICD-10-CM | POA: Diagnosis not present

## 2012-06-21 DIAGNOSIS — Z23 Encounter for immunization: Secondary | ICD-10-CM | POA: Diagnosis not present

## 2012-06-21 DIAGNOSIS — R5383 Other fatigue: Secondary | ICD-10-CM

## 2012-06-21 DIAGNOSIS — Z136 Encounter for screening for cardiovascular disorders: Secondary | ICD-10-CM | POA: Diagnosis not present

## 2012-06-21 DIAGNOSIS — R1310 Dysphagia, unspecified: Secondary | ICD-10-CM

## 2012-06-21 DIAGNOSIS — Z13220 Encounter for screening for lipoid disorders: Secondary | ICD-10-CM | POA: Diagnosis not present

## 2012-06-21 LAB — BASIC METABOLIC PANEL
BUN: 16 mg/dL (ref 6–23)
Calcium: 9.2 mg/dL (ref 8.4–10.5)
GFR: 80.12 mL/min (ref >60.00)
Glucose, Bld: 92 mg/dL (ref 70–99)
Potassium: 4.5 mEq/L (ref 3.5–5.1)

## 2012-06-21 LAB — LIPID PANEL
Cholesterol: 212 mg/dL — ABNORMAL HIGH (ref 0–200)
HDL: 68.1 mg/dL (ref >39.00)
VLDL: 18.4 mg/dL (ref 0.0–40.0)

## 2012-06-21 LAB — HEPATIC FUNCTION PANEL
ALT: 14 U/L (ref 0–35)
AST: 22 U/L (ref 0–37)
Bilirubin, Direct: 0.1 mg/dL (ref 0.0–0.3)
Total Bilirubin: 0.8 mg/dL (ref 0.3–1.2)
Total Protein: 7.5 g/dL (ref 6.0–8.3)

## 2012-06-21 LAB — CBC WITH DIFFERENTIAL/PLATELET
Basophils Absolute: 0 10*3/uL (ref 0.0–0.1)
Eosinophils Relative: 1.9 % (ref 0.0–5.0)
HCT: 42.9 % (ref 36.0–46.0)
Lymphocytes Relative: 36.9 % (ref 12.0–46.0)
Monocytes Relative: 8.4 % (ref 3.0–12.0)
Neutrophils Relative %: 52.2 % (ref 43.0–77.0)
Platelets: 265 10*3/uL (ref 150.0–400.0)
RDW: 13.3 % (ref 11.5–14.6)
WBC: 7.3 10*3/uL (ref 4.5–10.5)

## 2012-06-21 NOTE — Progress Notes (Signed)
Subjective:    Patient ID: Michelle Vazquez, female    DOB: 06-13-1929, 78 y.o.   MRN: [id number]  HPI  New pt to me, here to establish care with PCP - prev care through Dr Patty Sermons Also here for medicare wellness  Diet: heart healthy  Physical activity: active Depression/mood screen: negative Hearing: intact to whispered voice Visual acuity: grossly normal, performs annual eye exam  ADLs: capable Fall risk: none Home safety: good Cognitive evaluation: intact to orientation, naming, recall and repetition EOL planning: adv directives, full code/ I agree  I have personally reviewed and have noted 1. The patient's medical and social history 2. Their use of alcohol, tobacco or illicit drugs 3. Their current medications and supplements 4. The patient's functional ability including ADL's, fall risks, home safety risks and hearing or visual impairment. 5. Diet and physical activities 6. Evidence for depression or mood disorders  Also noted concern re: progressive dysphagia - Chronic symptoms - initially eval in 2006 with barium swallow Reports dysphagia for all solids, large tabs, caps and  Now soft food No regurgitation or severe pain Requires water to complete swallowing at all meals  Past Medical History  Diagnosis Date  . Mitral valve prolapse   . Parathyroid adenoma     post parathyroid surgery in October of 2008  . Premature ventricular contractions (PVCs) (VPCs)   . Paroxysmal supraventricular tachycardia   . History of hysterectomy 03/1988  . Arthritis   . Urinary incontinence    Family History  Problem Relation Age of Onset  . Arthritis Father   . Heart disease Father   . Hypertension Father   . Stroke Father   . Heart attack Father   . Arthritis Mother   . Stroke Mother   . Stroke Brother   . Diabetes Brother   . Cancer Brother   . Hypertension Brother   . Cancer Sister     Colon Cancer  . Hyperlipidemia Sister   . Hypertension Sister   . Hypertension  Sister    History  Substance Use Topics  . Smoking status: Never Smoker   . Smokeless tobacco: Never Used  . Alcohol Use: No   Review of Systems Constitutional: Negative for fever or unexpected weight change. mild fatigue x 2 months, intermittent symptoms  Respiratory: Negative for cough and shortness of breath.   Cardiovascular: Negative for chest pain or palpitations.  Gastrointestinal: Negative for abdominal pain, no bowel changes.  Musculoskeletal: Negative for gait problem or joint swelling.  Skin: Negative for rash.  Neurological: Negative for dizziness or headache.  No other specific complaints in a complete review of systems (except as listed in HPI above).     Objective:   Physical Exam BP 122/70  Pulse 43  Temp 98 F (36.7 C) (Oral)  Ht 5\' 3"  (1.6 m)  Wt 106 lb (48.081 kg)  BMI 18.78 kg/m2  SpO2 96% Wt Readings from Last 3 Encounters:  06/21/12 106 lb (48.081 kg)  06/21/11 105 lb (47.628 kg)  05/20/10 107 lb (48.535 kg)   Constitutional: She is thin, fit and appears well-developed and well-nourished. No distress.  HENT: Head: Normocephalic and atraumatic. Ears: B TMs ok, no erythema or effusion; Nose: Nose normal.  Mouth/Throat: Oropharynx is clear and moist. No oropharyngeal exudate.  Eyes: wears glasses. Conjunctivae and EOM are normal. Pupils are equal, round, and reactive to light. No scleral icterus.  Neck: Normal range of motion. Neck supple. No JVD present. No thyromegaly present.  Cardiovascular: Normal  rate, regular rhythm and normal heart sounds.  No murmur heard. No BLE edema. Pulmonary/Chest: Effort normal and breath sounds normal. No respiratory distress. She has no wheezes.  Abdominal: Soft. Bowel sounds are normal. She exhibits no distension. There is no tenderness. no masses Musculoskeletal: Normal range of motion, no joint effusions. No gross deformities Neurological: She is alert and oriented to person, place, and time. No cranial nerve deficit.  Coordination normal.  Skin: Skin is warm and dry. No rash noted. No erythema.  Psychiatric: She has a normal mood and affect. Her behavior is normal. Judgment and thought content normal.   Lab Results  Component Value Date   WBC 6.2 06/21/2011   HGB 14.1 06/21/2011   HCT 42.3 06/21/2011   PLT 262.0 06/21/2011   GLUCOSE 98 06/21/2011   CHOL 217* 06/21/2011   TRIG 64.0 06/21/2011   HDL 76.40 06/21/2011   LDLDIRECT 124.0 06/21/2011   ALT 14 06/21/2011   AST 21 06/21/2011   NA 144 06/21/2011   K 4.8 06/21/2011   CL 106 06/21/2011   CREATININE 0.9 06/21/2011   BUN 18 06/21/2011   CO2 32 06/21/2011        Assessment & Plan:   AWV/v70.0 - Today patient counseled on age appropriate routine health concerns for screening and prevention, each reviewed and up to date or declined. Immunizations reviewed and up to date or declined. Labs ordered and reviewed. Risk factors for depression reviewed and negative. Hearing function and visual acuity are intact. ADLs screened and addressed as needed. Functional ability and level of safety reviewed and appropriate. Education, counseling and referrals performed based on assessed risks today. Patient provided with a copy of personalized plan for preventive services.  Mild fatigue - exam benign - check screening labs   Also see problem list. Medications and labs reviewed today.

## 2012-06-21 NOTE — Assessment & Plan Note (Signed)
Chronic and progressive symptoms  No significant weight loss or regurgitation but significant problem with solids and soft foods Also unable to swallow caps or larger caps - prefers chewable or disolved Reviewed barium swallow 06/2011 with retained pill - ?sinus or diverticular pocket Prior eval with Dr Randa Evens remote but denies EGD - would like different GI opinion - new refer done

## 2012-06-21 NOTE — Patient Instructions (Signed)
It was good to see you today. We have reviewed your prior records including labs and tests today Test(s) ordered today. Return when you are fasting for labs. Your results will be called to you after review (48-72hours after test completion). If any changes need to be made, you will be notified at that time. Health Maintenance reviewed - all recommended immunizations and age-appropriate screenings are up-to-date. Medications reviewed, no changes at this time. we'll make referral to [facility] gastroenterology for evaluation of your swallowing. Our office will contact you regarding appointment(s) once made.

## 2012-06-24 ENCOUNTER — Encounter: Payer: Self-pay | Admitting: Internal Medicine

## 2012-06-24 LAB — LDL CHOLESTEROL, DIRECT: Direct LDL: 127.6 mg/dL

## 2012-06-25 ENCOUNTER — Ambulatory Visit
Admission: RE | Admit: 2012-06-25 | Discharge: 2012-06-25 | Disposition: A | Discharge status: Home / Self Care | Payer: Medicare Other | Source: Ambulatory Visit | Attending: Cardiology | Admitting: Cardiology

## 2012-06-25 ENCOUNTER — Encounter: Payer: Self-pay | Admitting: Cardiology

## 2012-06-25 ENCOUNTER — Ambulatory Visit: Payer: Medicare Other | Admitting: Cardiology

## 2012-06-25 ENCOUNTER — Ambulatory Visit (INDEPENDENT_AMBULATORY_CARE_PROVIDER_SITE_OTHER): Payer: Medicare Other | Admitting: Cardiology

## 2012-06-25 VITALS — BP 126/64 | HR 86 | Resp 18 | Ht 62.0 in | Wt 108.0 lb

## 2012-06-25 DIAGNOSIS — I4949 Other premature depolarization: Secondary | ICD-10-CM | POA: Diagnosis not present

## 2012-06-25 DIAGNOSIS — J438 Other emphysema: Secondary | ICD-10-CM | POA: Diagnosis not present

## 2012-06-25 DIAGNOSIS — I059 Rheumatic mitral valve disease, unspecified: Secondary | ICD-10-CM | POA: Diagnosis not present

## 2012-06-25 DIAGNOSIS — R002 Palpitations: Secondary | ICD-10-CM | POA: Diagnosis not present

## 2012-06-25 DIAGNOSIS — I517 Cardiomegaly: Secondary | ICD-10-CM | POA: Diagnosis not present

## 2012-06-25 DIAGNOSIS — I341 Nonrheumatic mitral (valve) prolapse: Secondary | ICD-10-CM

## 2012-06-25 DIAGNOSIS — R1310 Dysphagia, unspecified: Secondary | ICD-10-CM | POA: Diagnosis not present

## 2012-06-25 DIAGNOSIS — I493 Ventricular premature depolarization: Secondary | ICD-10-CM

## 2012-06-25 MED ORDER — METOPROLOL SUCCINATE ER 25 MG PO TB24: 25.0000 mg | ORAL_TABLET | Freq: Every day | ORAL | Status: DC

## 2012-06-25 NOTE — Assessment & Plan Note (Signed)
The patient has not been experiencing any symptoms of congestive heart failure.  Her chest x-ray in the past has shown heart size at the upper limit of normal and we are getting another chest x-ray today

## 2012-06-25 NOTE — Progress Notes (Signed)
Michelle Vazquez Date of Birth:  [birth year] [facility] [address] [place], [place]  [address] [phone]         Fax   [phone]  History of Present Illness: This pleasant 78 year old Caucasian female is seen for a one year followup office visit. She has a past history of mitral valve prolapse. 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 In 2008 she underwent parathyroidectomy for parathyroid adenoma. Subsequent serum calcium levels have been normal she has not been expressing any chest pain or angina. She does not have any history of ischemic heart disease and she had a normal nuclear stress test on 06/19/07 which showed no evidence of ischemia and her ejection fraction was 46%. Her last echocardiogram was 03/17/09 showed an ejection fraction of 55-60% and showed normal left ventricular systolic function with diastolic dysfunction, mild aortic sclerosis, mitral valve prolapse with mild mitral regurgitation, and normal pulmonary artery pressure. The patient has a past history of difficulty swallowing. 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 2006 she saw Dr. Randa Evens who repeated the time swallowing and also did a colonoscopy. The colonoscopy was incomplete because of strictures and diverticulitis. Recently the patient is started to have problems with difficulty swallowing again.  She recently saw her primary care physician Dr. Miguel Dibble per who has made arrangements for her to see a gastroenterologist about her swallowing.   Current Outpatient Prescriptions  Medication Sig Dispense Refill  . calcium gluconate 500 MG tablet Take 500 mg by mouth daily.        . metoprolol succinate (TOPROL-XL) 25 MG 24 hr tablet Take 1 tablet (25 mg total) by mouth daily.  30 tablet  11  . multivitamin (THERAGRAN) per tablet Take 1 tablet by mouth daily.        Marland Kitchen DISCONTD: metoprolol succinate (TOPROL-XL) 25 MG 24 hr tablet Take 1 tablet (25 mg total) by mouth daily.  90  tablet  3    Allergies  Allergen Reactions  . Inderal (Propranolol Hcl)   . Lopressor (Metoprolol Tartrate)   . Penicillins     Patient Active Problem List  Diagnosis  . Unifocal PVCs  . Mitral valve prolapse  . Swallowing dysfunction  . Parathyroid adenoma    History  Smoking status  . Never Smoker   Smokeless tobacco  . Never Used  Comment: married x 2 - retired Engineer, maintenance ([employer])    History  Alcohol Use No    Family History  Problem Relation Age of Onset  . Arthritis Father   . Heart disease Father   . Hypertension Father   . Stroke Father   . Heart attack Father   . Arthritis Mother   . Stroke Mother   . Stroke Brother   . Diabetes Brother   . Cancer Brother   . Hypertension Brother   . Cancer Sister     Colon Cancer  . Hyperlipidemia Sister   . Hypertension Sister   . Hypertension Sister     Review of Systems: Constitutional: no fever chills diaphoresis or fatigue or change in weight.  Head and neck: no hearing loss, no epistaxis, no photophobia or visual disturbance. Respiratory: No cough, shortness of breath or wheezing. Cardiovascular: No chest pain peripheral edema, palpitations. Gastrointestinal: No abdominal distention, no abdominal pain, no change in bowel habits hematochezia or melena. Genitourinary: No dysuria, no frequency, no urgency, no nocturia. Musculoskeletal:No arthralgias, no back pain, no gait disturbance or myalgias. Neurological: No dizziness, no  headaches, no numbness, no seizures, no syncope, no weakness, no tremors. Hematologic: No lymphadenopathy, no easy bruising. Psychiatric: No confusion, no hallucinations, no sleep disturbance.    Physical Exam: Filed Vitals:   06/25/12 1336  BP: 126/64  Pulse: 86  Resp: 18   the general appearance reveals an alert elderly woman in no distress.The head and neck exam reveals pupils equal and reactive.  Extraocular movements are full.  There is no scleral icterus.  The mouth and  pharynx are normal.  The neck is supple.  The carotids reveal no bruits.  The jugular venous pressure is normal.  The  thyroid is not enlarged.  There is no lymphadenopathy.  The chest is clear to percussion and auscultation.  There are no rales or rhonchi.  Expansion of the chest is symmetrical.  The precordium is quiet.  The first heart sound is normal.  The second heart sound is physiologically split.  There is no  gallop rub or click.  There is a faint systolic murmur at the apex.  There is no abnormal lift or heave.  The abdomen is soft and nontender.  The bowel sounds are normal.  The liver and spleen are not enlarged.  There are no abdominal masses.  There are no abdominal bruits.  Extremities reveal good pedal pulses.  There is no phlebitis or edema.  There is no cyanosis or clubbing.  Strength is normal and symmetrical in all extremities.  There is no lateralizing weakness.  There are no sensory deficits.  The skin is warm and dry.  There is no rash.  EKG today shows sinus rhythm with frequent and consecutive PVCs and her QTC. interval his 484 ms   Assessment / Plan: Continue low-dose beta blocker Toprol XL 25 mg daily.  Recheck in one year for followup office visit and EKG.

## 2012-06-25 NOTE — Assessment & Plan Note (Signed)
Although she is having a difficulty swallowing her weight is actually up 3 pounds since last visit.  She will be seeing gastroenterology for consultation soon.

## 2012-06-25 NOTE — Assessment & Plan Note (Signed)
The patient has had a long history of frequent and consecutive PVCs.  She has not been experiencing any sustained tachycardia dizziness or syncope.  Her exercise tolerance is good.  She walks her dog for exercise and also does yardwork.

## 2012-06-25 NOTE — Patient Instructions (Addendum)
  Will have you go for a chest xray and call you with the results  Your physician wants you to follow-up in: 1 year You will receive a reminder letter in the mail two months in advance. If you don't receive a letter, please call our office to schedule the follow-up appointment.

## 2012-06-27 ENCOUNTER — Telehealth: Payer: Self-pay | Admitting: Cardiology

## 2012-06-27 NOTE — Telephone Encounter (Signed)
Advised patient of xray.  Did advised if she has any problems (fatigue or low heart rate) with new dose to let us know.

## 2012-06-27 NOTE — Telephone Encounter (Signed)
Fu call pt returning your call °

## 2012-06-27 NOTE — Telephone Encounter (Signed)
Message copied by Burnell Blanks on Thu Jun 27, 2012  2:50 PM ------      Message from: Cassell Clement      Created: Tue Jun 25, 2012  9:14 PM       Please report. The heart is mildly enlarged. I want her to increase her Toprol to one and a half tablets each am.

## 2012-07-22 ENCOUNTER — Encounter: Payer: Self-pay | Admitting: Internal Medicine

## 2012-07-23 ENCOUNTER — Ambulatory Visit: Payer: Medicare Other | Admitting: Internal Medicine

## 2012-07-25 ENCOUNTER — Encounter: Payer: Self-pay | Admitting: Internal Medicine

## 2012-07-26 ENCOUNTER — Encounter: Payer: Self-pay | Admitting: Internal Medicine

## 2012-07-26 ENCOUNTER — Ambulatory Visit (INDEPENDENT_AMBULATORY_CARE_PROVIDER_SITE_OTHER): Payer: Medicare Other | Admitting: Internal Medicine

## 2012-07-26 VITALS — BP 94/60 | HR 56 | Ht 61.5 in | Wt 109.0 lb

## 2012-07-26 DIAGNOSIS — R1310 Dysphagia, unspecified: Secondary | ICD-10-CM

## 2012-07-26 NOTE — Progress Notes (Signed)
Patient ID: LAASIA ARCOS, female   DOB: 1929-07-03, 78 y.o.   MRN: [id number]  SUBJECTIVE: HPI Mrs. Hilgeman is an 78 year old female with a past medical history of parathyroid adenoma status post left parathyroidectomy, mitral valve prolapse, PVCs, osteoarthritis, atrial for ablation, hyperlipidemia who seen in consultation at the request of Dr. Felicity Coyer for evaluation of dysphagia.  The patient reports several years of difficulty swallowing, but this has seemed to get worse and worse. She states that she does not eat much meat, but solid foods and pills are primarily what gives her trouble swallowing. She has not had trouble with liquids.  She had a barium swallow roughly one year ago and reports she was told "her swallowing was out of sync", and since then she has tried to take smaller bites, chew well, and take longer to eat.  Despite this, she still had trouble swallowing. She denies heartburn. No nausea and vomiting. Good appetite. Stable weight. Normal bowel habits without blood or melena. No fevers or chills.  No prior endoscopy  Review of Systems  As per history of present illness, otherwise negative   Past Medical History  Diagnosis Date  . Mitral valve prolapse   . Parathyroid adenoma     post parathyroid surgery in October of 2008  . Premature ventricular contractions (PVCs) (VPCs)   . Paroxysmal supraventricular tachycardia   . Arthritis   . Urinary incontinence   . Atrial fibrillation   . HLD (hyperlipidemia)   . Bowel obstruction 2000    Current Outpatient Prescriptions  Medication Sig Dispense Refill  . calcium gluconate 500 MG tablet Take 500 mg by mouth daily.        . metoprolol succinate (TOPROL-XL) 25 MG 24 hr tablet Take 37.5 mg by mouth daily.      . multivitamin (THERAGRAN) per tablet Take 1 tablet by mouth daily.        Marland Kitchen DISCONTD: metoprolol succinate (TOPROL-XL) 25 MG 24 hr tablet Take 1 tablet (25 mg total) by mouth daily.  30 tablet  11    Allergies    Allergen Reactions  . Inderal (Propranolol Hcl)   . Lopressor (Metoprolol Tartrate)   . Penicillins     Family History  Problem Relation Age of Onset  . Arthritis Father   . Heart disease Father   . Hypertension Father   . Stroke Father   . Heart attack Father   . Arthritis Mother   . Stroke Mother   . Stroke Brother   . Diabetes Brother   . Hypertension Brother   . Colon cancer Sister   . Hyperlipidemia Sister   . Hypertension Sister   . Hypertension Sister     History  Substance Use Topics  . Smoking status: Never Smoker   . Smokeless tobacco: Never Used   Comment: married x 2 - retired Engineer, maintenance ([employer])  . Alcohol Use: No    OBJECTIVE: BP 94/60  Pulse 56  Ht 5' 1.5" (1.562 m)  Wt 109 lb (49.442 kg)  BMI 20.26 kg/m2 Constitutional: Well-developed and well-nourished. No distress. HEENT: Normocephalic and atraumatic. Oropharynx is clear and moist. No oropharyngeal exudate. Conjunctivae are normal. Pupils are equal round and reactive to light. No scleral icterus. Neck: Neck supple. Trachea midline. Cardiovascular: Irregularly irregular and intact distal pulses.  Pulmonary/chest: Effort normal and breath sounds normal. No wheezing, rales or rhonchi. Abdominal: Soft, thin, nontender, nondistended. Bowel sounds active throughout. There are no masses palpable. No hepatosplenomegaly. Extremities: no clubbing, cyanosis, or edema,  scattered small ecchymoses bilateral upper Lymphadenopathy: No cervical adenopathy noted. Neurological: Alert and oriented to person place and time. Skin: Skin is warm and dry. No rashes noted. Psychiatric: Normal mood and affect. Behavior is normal.  Labs and Imaging -- CBC    Component Value Date/Time   WBC 7.3 06/21/2012 1101   RBC 4.57 06/21/2012 1101   HGB 13.9 06/21/2012 1101   HCT 42.9 06/21/2012 1101   PLT 265.0 06/21/2012 1101   MCV 93.9 06/21/2012 1101   MCHC 32.4 06/21/2012 1101   RDW 13.3 06/21/2012 1101   LYMPHSABS 2.7 06/21/2012  1101   MONOABS 0.6 06/21/2012 1101   EOSABS 0.1 06/21/2012 1101   BASOSABS 0.0 06/21/2012 1101   ESOPHOGRAM/BARIUM SWALLOW -- Aug 2012   Technique:  Combined double contrast and single contrast examination performed using effervescent crystals, thick barium liquid, and thin barium liquid.   Fluoroscopy time:  4.4 minutes.   Comparison:  Barium swallow of 04/13/2005   Findings:  A double contrast study shows the mucosa of the esophagus to be unremarkable.  A single contrast study shows no aspiration.  The swallowing mechanism is unremarkable.  Slight prominence of the cricopharyngeus muscle is noted. As noted on prior study the primary esophageal peristaltic wave is diminished, with the esophagus being somewhat a peristaltic.  No hiatal hernia is seen.  No reflux could be demonstrated.  A barium pill was given at the end of the study which lodged within the left piriform sinus and did not pass after repeated swallows.  The patient was given additional liquid and the tablet did dissolve.   IMPRESSION:   1.  Poor primary peristaltic wave as noted on the study of 2006 with mild tertiary contractions. 2.  Slightly prominent cricopharyngeus muscle. 3.  Barium pill lodged in the left piriform sinus.    ASSESSMENT AND PLAN: 78 year old female with a past medical history of parathyroid adenoma status post left parathyroidectomy, mitral valve prolapse, PVCs, osteoarthritis, atrial for ablation, hyperlipidemia who seen in consultation at the request of Dr. Felicity Coyer for evaluation of dysphagia  1. Dysphagia -- her barium swallow indicated esophageal dysmotility, and this likely is contributing to her symptoms. Her symptoms have been somewhat progressive, and she desires direct visualization with upper endoscopy. We discussed how this test would identify any mass lesions, inflammation, or stricture/ring. Dilation would only be expected to benefit a structural issue such as a stricture or ring.  She is aware that dilatation does not help and dysmotility, and are very few therapeutic options to treat dysmotility.  She reports she can usually get by with dietary and lifestyle modification, but would rest easier with direct visualization.  She did meet with speech and swallow pathology about a year ago, but did not find this overly helpful. We discussed EGD today and she is agreeable to proceed. Further recommendations can be made thereafter.

## 2012-07-29 ENCOUNTER — Encounter: Payer: Self-pay | Admitting: Internal Medicine

## 2012-07-29 ENCOUNTER — Ambulatory Visit (AMBULATORY_SURGERY_CENTER): Payer: Medicare Other | Admitting: Internal Medicine

## 2012-07-29 VITALS — BP 115/78 | HR 87 | Temp 98.2°F | Resp 18 | Ht 61.0 in | Wt 109.0 lb

## 2012-07-29 DIAGNOSIS — R1310 Dysphagia, unspecified: Secondary | ICD-10-CM | POA: Diagnosis not present

## 2012-07-29 DIAGNOSIS — I499 Cardiac arrhythmia, unspecified: Secondary | ICD-10-CM | POA: Diagnosis not present

## 2012-07-29 MED ORDER — SODIUM CHLORIDE 0.9 % IV SOLN: 500.0000 mL | INTRAVENOUS | Status: DC

## 2012-07-29 NOTE — Op Note (Signed)
[facility] [address] [place], [address]   ENDOSCOPY PROCEDURE REPORT  PATIENT: Michelle, Vazquez  MR#: [id number] BIRTHDATE: [birth year] , 82  yrs. old GENDER: Female ENDOSCOPIST: Beverley Fiedler, MD REFERRED BY:  Rene Paci PROCEDURE DATE:  07/29/2012 PROCEDURE:  EGD, diagnostic ASA CLASS:     Class III INDICATIONS:  dysphagia. MEDICATIONS: MAC sedation, administered by CRNA and Propofol (Diprivan) 160 mg IV TOPICAL ANESTHETIC: Cetacaine Spray  DESCRIPTION OF PROCEDURE: After the risks benefits and alternatives of the procedure were thoroughly explained, informed consent was obtained.  The LB GIF-H180 [id number] endoscope was introduced through the mouth and advanced to the second portion of the duodenum. Without limitations.  The instrument was slowly withdrawn as the mucosa was fully examined.    The upper, middle and distal third of the esophagus were carefully inspected and no abnormalities were noted.  The z-line was well seen at the GEJ.  The endoscope was pushed into the fundus which was normal including a retroflexed view.  The antrum, gastric body, first and second part of the duodenum were unremarkable. Retroflexed views revealed no abnormalities.     The scope was then withdrawn from the patient and the procedure completed.  COMPLICATIONS: There were no complications.  ENDOSCOPIC IMPRESSION: Normal EGD  RECOMMENDATIONS: 1.  Continue current medications 2.  Soft diet, chew food well. 3.  Clinic follow-up as needed  eSigned:  Beverley Fiedler, MD 07/29/2012 4:16 PM   [id number] A Felicity Coyer, MD and The Patient

## 2012-07-29 NOTE — Patient Instructions (Addendum)
Normal Endoscopy  Continue current medications. Soft diet, chew food well. Follow up in office as needed.  YOU HAD AN ENDOSCOPIC PROCEDURE TODAY AT THE [facility]: Refer to the procedure report that was given to you for any specific questions about what was found during the examination.  If the procedure report does not answer your questions, please call your gastroenterologist to clarify.  If you requested that your care partner not be given the details of your procedure findings, then the procedure report has been included in a sealed envelope for you to review at your convenience later.  YOU SHOULD EXPECT: Some feelings of bloating in the abdomen. Passage of more gas than usual.  Walking can help get rid of the air that was put into your GI tract during the procedure and reduce the bloating. If you had a lower endoscopy (such as a colonoscopy or flexible sigmoidoscopy) you may notice spotting of blood in your stool or on the toilet paper. If you underwent a bowel prep for your procedure, then you may not have a normal bowel movement for a few days.  DIET: Your first meal following the procedure should be a light meal and then it is ok to progress to your normal diet.  A half-sandwich or bowl of soup is an example of a good first meal.  Heavy or fried foods are harder to digest and may make you feel nauseous or bloated.  Likewise meals heavy in dairy and vegetables can cause extra gas to form and this can also increase the bloating.  Drink plenty of fluids but you should avoid alcoholic beverages for 24 hours.  ACTIVITY: Your care partner should take you home directly after the procedure.  You should plan to take it easy, moving slowly for the rest of the day.  You can resume normal activity the day after the procedure however you should NOT DRIVE or use heavy machinery for 24 hours (because of the sedation medicines used during the test).    SYMPTOMS TO REPORT IMMEDIATELY: A  gastroenterologist can be reached at any hour.  During normal business hours, 8:30 AM to 5:00 PM Monday through Friday, call [phone].  After hours and on weekends, please call the GI answering service at [phone] who will take a message and have the physician on call contact you.   Following upper endoscopy (EGD)  Vomiting of blood or coffee ground material  New chest pain or pain under the shoulder blades  Painful or persistently difficult swallowing  New shortness of breath  Fever of 100F or higher  Black, tarry-looking stools  FOLLOW UP: If any biopsies were taken you will be contacted by phone or by letter within the next 1-3 weeks.  Call your gastroenterologist if you have not heard about the biopsies in 3 weeks.  Our staff will call the home number listed on your records the next business day following your procedure to check on you and address any questions or concerns that you may have at that time regarding the information given to you following your procedure. This is a courtesy call and so if there is no answer at the home number and we have not heard from you through the emergency physician on call, we will assume that you have returned to your regular daily activities without incident.  SIGNATURES/CONFIDENTIALITY: You and/or your care partner have signed paperwork which will be entered into your electronic medical record.  These signatures attest to the fact that  that the information above on your After Visit Summary has been reviewed and is understood.  Full responsibility of the confidentiality of this discharge information lies with you and/or your care-partner.

## 2012-07-30 ENCOUNTER — Telehealth: Payer: Self-pay

## 2012-07-30 NOTE — Telephone Encounter (Signed)
  Follow up Call-  Call back number 07/29/2012  Post procedure Call Back phone  # [phone] hm  Permission to leave phone message Yes     Patient questions:  Do you have a fever, pain , or abdominal swelling? no Pain Score  0 *  Have you tolerated food without any problems? yes  Have you been able to return to your normal activities? yes  Do you have any questions about your discharge instructions: Diet   no Medications  no Follow up visit  no  Do you have questions or concerns about your Care? no  Actions: * If pain score is 4 or above: No action needed, pain <4.  Per the pt, "I have a scatchy throat".  I advised her sometimes that happen after the EGD.  It should resolve within 1 or 2 days.  If it does not to call us back.  Pt said she would. maw

## 2012-08-01 DIAGNOSIS — H26499 Other secondary cataract, unspecified eye: Secondary | ICD-10-CM | POA: Diagnosis not present

## 2012-08-01 DIAGNOSIS — H02059 Trichiasis without entropian unspecified eye, unspecified eyelid: Secondary | ICD-10-CM | POA: Diagnosis not present

## 2012-08-15 ENCOUNTER — Other Ambulatory Visit: Payer: Self-pay | Admitting: Internal Medicine

## 2012-08-15 DIAGNOSIS — Z1231 Encounter for screening mammogram for malignant neoplasm of breast: Secondary | ICD-10-CM

## 2012-09-05 DIAGNOSIS — Z23 Encounter for immunization: Secondary | ICD-10-CM | POA: Diagnosis not present

## 2012-09-18 ENCOUNTER — Telehealth: Payer: Self-pay | Admitting: Cardiology

## 2012-09-18 NOTE — Telephone Encounter (Signed)
Try going back to prior dose of 25 mg daily.

## 2012-09-18 NOTE — Telephone Encounter (Signed)
Spoke with pt who states she needs prescription for increased dose of Toprol (37.5 mg daily) sent to [facility] on [address].  This was increased in August.  Pt then reports she notices heart rate in 40's at times.  States heart rate is up and down--ranging from 40's to 80's.  She thinks it has been running lower since Toprol increased.  She states she feels tired when heart rate low.  She takes Toprol at night and heart rate is usually higher when she gets up and goes down during day.  She does not check it everyday.  I told pt she should not take Toprol tonight if heart rate is in the 40's and I would forward message to Dr. Patty Sermons for his recommendations for further dosing.  She does not want refill sent in until medications reviewed by Dr. Patty Sermons.

## 2012-09-18 NOTE — Telephone Encounter (Signed)
Plz return call to pt [phone] to patient regarding metoprolol med change

## 2012-09-19 ENCOUNTER — Other Ambulatory Visit: Payer: Self-pay | Admitting: Cardiology

## 2012-09-19 ENCOUNTER — Ambulatory Visit
Admission: RE | Admit: 2012-09-19 | Discharge: 2012-09-19 | Disposition: A | Discharge status: Home / Self Care | Payer: Medicare Other | Source: Ambulatory Visit | Attending: Internal Medicine | Admitting: Internal Medicine

## 2012-09-19 DIAGNOSIS — Z1231 Encounter for screening mammogram for malignant neoplasm of breast: Secondary | ICD-10-CM

## 2012-09-19 MED ORDER — METOPROLOL SUCCINATE ER 25 MG PO TB24: 37.5000 mg | ORAL_TABLET | Freq: Every day | ORAL | Status: DC

## 2012-09-19 NOTE — Telephone Encounter (Signed)
Left message to call back  

## 2012-10-10 NOTE — Telephone Encounter (Signed)
Patient never returned call  

## 2013-06-24 ENCOUNTER — Other Ambulatory Visit (INDEPENDENT_AMBULATORY_CARE_PROVIDER_SITE_OTHER): Payer: Medicare Other

## 2013-06-24 ENCOUNTER — Ambulatory Visit (INDEPENDENT_AMBULATORY_CARE_PROVIDER_SITE_OTHER): Payer: Medicare Other | Admitting: Internal Medicine

## 2013-06-24 ENCOUNTER — Encounter: Payer: Self-pay | Admitting: Internal Medicine

## 2013-06-24 VITALS — BP 140/72 | HR 43 | Temp 98.2°F | Ht 61.5 in | Wt 104.8 lb

## 2013-06-24 DIAGNOSIS — I4891 Unspecified atrial fibrillation: Secondary | ICD-10-CM

## 2013-06-24 DIAGNOSIS — R5383 Other fatigue: Secondary | ICD-10-CM

## 2013-06-24 DIAGNOSIS — R5381 Other malaise: Secondary | ICD-10-CM | POA: Diagnosis not present

## 2013-06-24 DIAGNOSIS — Z0000 Encounter for general adult medical examination without abnormal findings: Secondary | ICD-10-CM

## 2013-06-24 DIAGNOSIS — E785 Hyperlipidemia, unspecified: Secondary | ICD-10-CM | POA: Diagnosis not present

## 2013-06-24 DIAGNOSIS — R6889 Other general symptoms and signs: Secondary | ICD-10-CM

## 2013-06-24 DIAGNOSIS — Z136 Encounter for screening for cardiovascular disorders: Secondary | ICD-10-CM

## 2013-06-24 DIAGNOSIS — M1990 Unspecified osteoarthritis, unspecified site: Secondary | ICD-10-CM | POA: Insufficient documentation

## 2013-06-24 DIAGNOSIS — R0989 Other specified symptoms and signs involving the circulatory and respiratory systems: Secondary | ICD-10-CM

## 2013-06-24 DIAGNOSIS — E7800 Pure hypercholesterolemia, unspecified: Secondary | ICD-10-CM | POA: Insufficient documentation

## 2013-06-24 LAB — CBC WITH DIFFERENTIAL/PLATELET
Basophils Absolute: 0 10*3/uL (ref 0.0–0.1)
HCT: 41.1 % (ref 36.0–46.0)
Lymphs Abs: 2.4 10*3/uL (ref 0.7–4.0)
MCV: 92.3 fl (ref 78.0–100.0)
Monocytes Absolute: 0.5 10*3/uL (ref 0.1–1.0)
Platelets: 256 10*3/uL (ref 150.0–400.0)
RDW: 13.6 % (ref 11.5–14.6)

## 2013-06-24 LAB — BASIC METABOLIC PANEL
BUN: 18 mg/dL (ref 6–23)
Chloride: 105 mEq/L (ref 96–112)
GFR: 67.2 mL/min (ref >60.00)
Glucose, Bld: 93 mg/dL (ref 70–99)
Potassium: 4.6 mEq/L (ref 3.5–5.1)

## 2013-06-24 LAB — HEPATIC FUNCTION PANEL
ALT: 15 U/L (ref 0–35)
Alkaline Phosphatase: 45 U/L (ref 39–117)
Bilirubin, Direct: 0.1 mg/dL (ref 0.0–0.3)
Total Bilirubin: 0.7 mg/dL (ref 0.3–1.2)

## 2013-06-24 LAB — LIPID PANEL: HDL: 66.5 mg/dL (ref >39.00)

## 2013-06-24 LAB — LDL CHOLESTEROL, DIRECT: Direct LDL: 130.1 mg/dL

## 2013-06-24 MED ORDER — DIGOXIN 125 MCG PO TABS: 125.0000 ug | ORAL_TABLET | Freq: Every day | ORAL | Status: DC

## 2013-06-24 NOTE — Progress Notes (Signed)
Subjective:    Patient ID: Michelle Vazquez, female    DOB: May 31, 1929, 79 y.o.   MRN: [id number]  HPI  here for annual medicare wellness  Diet: heart healthy  Physical activity: active Depression/mood screen: negative Hearing: intact to whispered voice Visual acuity: grossly normal, performs annual eye exam  ADLs: capable Fall risk: none Home safety: good Cognitive evaluation: intact to orientation, naming, recall and repetition EOL planning: adv directives, full code/ I agree  I have personally reviewed and have noted 1. The patient's medical and social history 2. Their use of alcohol, tobacco or illicit drugs 3. Their current medications and supplements 4. The patient's functional ability including ADL's, fall risks, home safety risks and hearing or visual impairment. 5. Diet and physical activities 6. Evidence for depression or mood disorders  Also noted concern re: progressive fatigue HR in 40s and very tired  Also continued "choke" with dry or starchy foods - no regurgitation or pain  Past Medical History  Diagnosis Date  . Mitral valve prolapse   . Parathyroid adenoma     post parathyroid surgery in October of 2008  . Premature ventricular contractions (PVCs) (VPCs)   . Paroxysmal supraventricular tachycardia   . Arthritis   . Urinary incontinence   . Atrial fibrillation   . HLD (hyperlipidemia)   . Bowel obstruction 2000  . Squamous cell carcinoma in situ of skin     face   Family History  Problem Relation Age of Onset  . Arthritis Father   . Heart disease Father   . Hypertension Father   . Stroke Father   . Heart attack Father   . Arthritis Mother   . Stroke Mother   . Stroke Brother   . Diabetes Brother   . Hypertension Brother   . Colon cancer Sister   . Hyperlipidemia Sister   . Hypertension Sister   . Hypertension Sister   . Esophageal cancer Neg Hx   . Rectal cancer Neg Hx   . Stomach cancer Neg Hx    History  Substance Use Topics  .  Smoking status: Never Smoker   . 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 Alcohol Use: No   Review of Systems  Constitutional: Negative for fever or unexpected weight change. chronic fatigue  Respiratory: Negative for cough and shortness of breath.   Cardiovascular: Negative for chest pain or palpitations.  Gastrointestinal: Negative for abdominal pain, no bowel changes.  Musculoskeletal: Negative for gait problem or joint swelling.  Skin: Negative for rash.  Neurological: Negative for dizziness or headache.  No other specific complaints in a complete review of systems (except as listed in HPI above).     Objective:   Physical Exam  BP 140/72  Pulse 43  Temp(Src) 98.2 F (36.8 C) (Oral)  Ht 5' 1.5" (1.562 m)  Wt 104 lb 12.8 oz (47.537 kg)  BMI 19.48 kg/m2  SpO2 99% Wt Readings from Last 3 Encounters:  06/24/13 104 lb 12.8 oz (47.537 kg)  07/29/12 109 lb (49.442 kg)  07/26/12 109 lb (49.442 kg)   Constitutional: She is thin, fit and appears well-developed and well-nourished. No distress.  HENT: Head: Normocephalic and atraumatic. Ears: B TMs ok, no erythema or effusion; Nose: Nose normal. Mouth/Throat: Oropharynx is clear and moist. No oropharyngeal exudate.  Eyes: wears glasses. Conjunctivae and EOM are normal. Pupils are equal, round, and reactive to light. No scleral icterus.  Neck: Fullness  left side as compared to right (chronic since adenoma surgery per pt). Normal range of motion. Neck supple. No JVD present. No thyromegaly present.  Cardiovascular: bradycardic, irregular rhythm and normal heart sounds.  2/6 syst murmur heard. No BLE edema. Pulmonary/Chest: Effort normal and breath sounds normal. No respiratory distress. She has no wheezes.  Abdominal: Soft. Bowel sounds are normal. She exhibits no distension. There is no tenderness. no masses Musculoskeletal: senile arthritic changes. Normal range of motion, no joint  effusions. No gross deformities Neurological: She is alert and oriented to person, place, and time. No cranial nerve deficit. Coordination normal.  Skin: Skin is warm and dry. No rash noted. No erythema.  Psychiatric: She has a normal mood and affect. Her behavior is normal. Judgment and thought content normal.   Lab Results  Component Value Date   WBC 7.3 06/21/2012   HGB 13.9 06/21/2012   HCT 42.9 06/21/2012   PLT 265.0 06/21/2012   GLUCOSE 92 06/21/2012   CHOL 212* 06/21/2012   TRIG 92.0 06/21/2012   HDL 68.10 06/21/2012   LDLDIRECT 127.6 06/21/2012   ALT 14 06/21/2012   AST 22 06/21/2012   NA 140 06/21/2012   K 4.5 06/21/2012   CL 102 06/21/2012   CREATININE 0.7 06/21/2012   BUN 16 06/21/2012   CO2 28 06/21/2012   TSH 1.10 06/21/2012   ECG: bigemeny @ 84 bpm     Assessment & Plan:   AWV/v70.0 - Today patient counseled on age appropriate routine health concerns for screening and prevention, each reviewed and up to date or declined. Immunizations reviewed and up to date or declined. Labs ordered and reviewed. Risk factors for depression reviewed and negative. Hearing function and visual acuity are intact. ADLs screened and addressed as needed. Functional ability and level of safety reviewed and appropriate. Education, counseling and referrals performed based on assessed risks today. Patient provided with a copy of personalized plan for preventive services.  Mild fatigue - exam benign - check screening labs   Also see problem list. Medications and labs reviewed today.  "choking" -Sensation of dysphagia - normal EGD on GI evaluation 07/2012 - continued feeling of choking unless oral intake supplemented with warm water - will refer to ENT for "throat" evaluation per suggestion of GI to pt

## 2013-06-24 NOTE — Patient Instructions (Signed)
It was good to see you today. We have reviewed your prior records including labs and tests today Test(s) ordered today. Your results will be called to you and mailed to you after review (48-72hours after test completion). If any changes need to be made, you will be notified at that time. Health Maintenance reviewed - all recommended immunizations and age-appropriate screenings are up-to-date. Medications reviewed - stop metoprolol and start low dose digoxin for your irregular heartbeat - no other changes at this time. Your prescription(s) have been submitted to your pharmacy. Please take as directed and contact our office if you believe you are having problem(s) with the medication(s). we'll make referral to ENT specialist for evaluation of your choking sensation and "swallowing" problem. Our office will contact you regarding appointment(s) once made. Please schedule followup in 1 year for annual wellness exams, call sooner if problems.    Health Maintenance, Females A healthy lifestyle and preventative care can promote health and wellness.  Maintain regular health, dental, and eye exams.  Eat a healthy diet. Foods like vegetables, fruits, whole grains, low-fat dairy products, and lean protein foods contain the nutrients you need without too many calories. Decrease your intake of foods high in solid fats, added sugars, and salt. Get information about a proper diet from your caregiver, if necessary.  Regular physical exercise is one of the most important things you can do for your health. Most adults should get at least 150 minutes of moderate-intensity exercise (any activity that increases your heart rate and causes you to sweat) each week. In addition, most adults need muscle-strengthening exercises on 2 or more days a week.   Maintain a healthy weight. The body mass index (BMI) is a screening tool to identify possible weight problems. It provides an estimate of body fat based on height and  weight. Your caregiver can help determine your BMI, and can help you achieve or maintain a healthy weight. For adults 20 years and older:  A BMI below 18.5 is considered underweight.  A BMI of 18.5 to 24.9 is normal.  A BMI of 25 to 29.9 is considered overweight.  A BMI of 30 and above is considered obese.  Maintain normal blood lipids and cholesterol by exercising and minimizing your intake of saturated fat. Eat a balanced diet with plenty of fruits and vegetables. Blood tests for lipids and cholesterol should begin at age 20 and be repeated every 5 years. If your lipid or cholesterol levels are high, you are over 50, or you are a high risk for heart disease, you may need your cholesterol levels checked more frequently.Ongoing high lipid and cholesterol levels should be treated with medicines if diet and exercise are not effective.  If you smoke, find out from your caregiver how to quit. If you do not use tobacco, do not start.  If you are pregnant, do not drink alcohol. If you are breastfeeding, be very cautious about drinking alcohol. If you are not pregnant and choose to drink alcohol, do not exceed 1 drink per day. One drink is considered to be 12 ounces (355 mL) of beer, 5 ounces (148 mL) of wine, or 1.5 ounces (44 mL) of liquor.  Avoid use of street drugs. Do not share needles with anyone. Ask for help if you need support or instructions about stopping the use of drugs.  High blood pressure causes heart disease and increases the risk of stroke. Blood pressure should be checked at least every 1 to 2 years. Ongoing high  blood pressure should be treated with medicines, if weight loss and exercise are not effective.  If you are 26 to 77 years old, ask your caregiver if you should take aspirin to prevent strokes.  Diabetes screening involves taking a blood sample to check your fasting blood sugar level. This should be done once every 3 years, after age 19, if you are within normal weight and  without risk factors for diabetes. Testing should be considered at a younger age or be carried out more frequently if you are overweight and have at least 1 risk factor for diabetes.  Breast cancer screening is essential preventative care for women. You should practice "breast self-awareness." This means understanding the normal appearance and feel of your breasts and may include breast self-examination. Any changes detected, no matter how small, should be reported to a caregiver. Women in their 18s and 30s should have a clinical breast exam (CBE) by a caregiver as part of a regular health exam every 1 to 3 years. After age 19, women should have a CBE every year. Starting at age 72, women should consider having a mammogram (breast X-ray) every year. Women who have a family history of breast cancer should talk to their caregiver about genetic screening. Women at a high risk of breast cancer should talk to their caregiver about having an MRI and a mammogram every year.  The Pap test is a screening test for cervical cancer. Women should have a Pap test starting at age 79. Between ages 48 and 31, Pap tests should be repeated every 2 years. Beginning at age 71, you should have a Pap test every 3 years as long as the past 3 Pap tests have been normal. If you had a hysterectomy for a problem that was not cancer or a condition that could lead to cancer, then you no longer need Pap tests. If you are between ages 56 and 7, and you have had normal Pap tests going back 10 years, you no longer need Pap tests. If you have had past treatment for cervical cancer or a condition that could lead to cancer, you need Pap tests and screening for cancer for at least 20 years after your treatment. If Pap tests have been discontinued, risk factors (such as a new sexual partner) need to be reassessed to determine if screening should be resumed. Some women have medical problems that increase the chance of getting cervical cancer. In  these cases, your caregiver may recommend more frequent screening and Pap tests.  The human papillomavirus (HPV) test is an additional test that may be used for cervical cancer screening. The HPV test looks for the virus that can cause the cell changes on the cervix. The cells collected during the Pap test can be tested for HPV. The HPV test could be used to screen women aged 42 years and older, and should be used in women of any age who have unclear Pap test results. After the age of 49, women should have HPV testing at the same frequency as a Pap test.  Colorectal cancer can be detected and often prevented. Most routine colorectal cancer screening begins at the age of 54 and continues through age 21. However, your caregiver may recommend screening at an earlier age if you have risk factors for colon cancer. On a yearly basis, your caregiver may provide home test kits to check for hidden blood in the stool. Use of a small camera at the end of a tube, to directly examine  the colon (sigmoidoscopy or colonoscopy), can detect the earliest forms of colorectal cancer. Talk to your caregiver about this at age 59, when routine screening begins. Direct examination of the colon should be repeated every 5 to 10 years through age 9, unless early forms of pre-cancerous polyps or small growths are found.  Hepatitis C blood testing is recommended for all people born from [birth year] through 1965 and any individual with known risks for hepatitis C.  Practice safe sex. Use condoms and avoid high-risk sexual practices to reduce the spread of sexually transmitted infections (STIs). Sexually active women aged 47 and younger should be checked for Chlamydia, which is a common sexually transmitted infection. Older women with new or multiple partners should also be tested for Chlamydia. Testing for other STIs is recommended if you are sexually active and at increased risk.  Osteoporosis is a disease in which the bones lose minerals  and strength with aging. This can result in serious bone fractures. The risk of osteoporosis can be identified using a bone density scan. Women ages 50 and over and women at risk for fractures or osteoporosis should discuss screening with their caregivers. Ask your caregiver whether you should be taking a calcium supplement or vitamin D to reduce the rate of osteoporosis.  Menopause can be associated with physical symptoms and risks. Hormone replacement therapy is available to decrease symptoms and risks. You should talk to your caregiver about whether hormone replacement therapy is right for you.  Use sunscreen with a sun protection factor (SPF) of 30 or greater. Apply sunscreen liberally and repeatedly throughout the day. You should seek shade when your shadow is shorter than you. Protect yourself by wearing long sleeves, pants, a wide-brimmed hat, and sunglasses year round, whenever you are outdoors.  Notify your caregiver of new moles or changes in moles, especially if there is a change in shape or color. Also notify your caregiver if a mole is larger than the size of a pencil eraser.  Stay current with your immunizations. Document Released: 05/08/2011 Document Revised: 01/15/2012 Document Reviewed: 05/08/2011 [facility] Patient Information 2014 [place], [place].

## 2013-06-24 NOTE — Assessment & Plan Note (Signed)
symptomatic bradycardia with bigemeny Stop beta-blocker and start low dose dig follow up with cards in next 30d to review med mgmt options to reduce symptomatic bradycardia (sched 9/15)

## 2013-06-24 NOTE — Assessment & Plan Note (Signed)
Never on statin Check lipids annually 

## 2013-06-25 DIAGNOSIS — R1314 Dysphagia, pharyngoesophageal phase: Secondary | ICD-10-CM | POA: Diagnosis not present

## 2013-07-11 ENCOUNTER — Encounter: Payer: Self-pay | Admitting: Cardiology

## 2013-07-11 ENCOUNTER — Encounter (INDEPENDENT_AMBULATORY_CARE_PROVIDER_SITE_OTHER): Payer: Medicare Other

## 2013-07-11 ENCOUNTER — Encounter: Payer: Self-pay | Admitting: *Deleted

## 2013-07-11 ENCOUNTER — Ambulatory Visit (INDEPENDENT_AMBULATORY_CARE_PROVIDER_SITE_OTHER): Payer: Medicare Other | Admitting: Cardiology

## 2013-07-11 VITALS — BP 128/88 | HR 58 | Ht 61.5 in | Wt 103.0 lb

## 2013-07-11 DIAGNOSIS — R002 Palpitations: Secondary | ICD-10-CM

## 2013-07-11 DIAGNOSIS — I059 Rheumatic mitral valve disease, unspecified: Secondary | ICD-10-CM | POA: Diagnosis not present

## 2013-07-11 DIAGNOSIS — R5381 Other malaise: Secondary | ICD-10-CM

## 2013-07-11 DIAGNOSIS — I341 Nonrheumatic mitral (valve) prolapse: Secondary | ICD-10-CM

## 2013-07-11 DIAGNOSIS — R1310 Dysphagia, unspecified: Secondary | ICD-10-CM

## 2013-07-11 DIAGNOSIS — I4949 Other premature depolarization: Secondary | ICD-10-CM

## 2013-07-11 DIAGNOSIS — I493 Ventricular premature depolarization: Secondary | ICD-10-CM

## 2013-07-11 NOTE — Assessment & Plan Note (Signed)
The patient has a long history of palpitations.  She is bothered by tachycardia and bradycardia.  She has not had syncope.  She does complain of feeling tired weak and fatigue.  She has shortness of breath at times.  Since we last saw her she is off her beta blocker and is on digoxin 0.125 mg daily for symptomatic control of palpitations.  She thinks that she may feel slightly better on the digoxin and she did on a beta blocker.  She is wondering how bad her palpitations are.  We will have her wear a 48 hour Holter monitor to assess them better.  I talked to her today about the fact that sometimes pacemakers are helpful in managing tachybradycardia syndromes.

## 2013-07-11 NOTE — Patient Instructions (Signed)
Your physician recommends that you continue on your current medications as directed. Please refer to the Current Medication list given to you today.  Your physician wants you to follow-up in: 1 YEAR OV/EKG You will receive a reminder letter in the mail two months in advance. If you don't receive a letter, please call our office to schedule the follow-up appointment.   NEED TO HAVE YOU GO SOON FOR A CHEST XRAY TO THE [facility] FROM [facility]   Your physician has recommended that you wear an event monitor. Event monitors are medical devices that record the heart's electrical activity. Doctors most often [place] these monitors to diagnose arrhythmias. Arrhythmias are problems with the speed or rhythm of the heartbeat. The monitor is a small, portable device. You can wear one while you do your normal daily activities. This is usually used to diagnose what is causing palpitations/syncope (passing out). 48 HOUR EVENT

## 2013-07-11 NOTE — Assessment & Plan Note (Signed)
The patient has known mitral valve prolapse.  Her chest x-ray last year showed borderline cardiomegaly.  The lungs were clear.  We will obtain a subsequent chest x-ray today.  She is concerned as to whether her heart has increased further in size since last visit.

## 2013-07-11 NOTE — Assessment & Plan Note (Signed)
The patient is still having swallowing function problems.  She saw her ENT physician Dr. Pollyann Kennedy who told her that her upper esophagus needs to be stretched again.  Dr.  Rhea Belton is her gastroenterologist.

## 2013-07-11 NOTE — Progress Notes (Signed)
Dominic Pea Date of Birth:  1929-06-26 [facility] [address] [place], [place]  [address] [phone]         Fax   [phone]  History of Present Illness: This pleasant 79 year old Caucasian female is seen for a one year followup office visit. She has a past history of mitral valve prolapse. 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 In 2008 she underwent parathyroidectomy for parathyroid adenoma. Subsequent serum calcium levels have been normal she has not been expressing any chest pain or angina. She does not have any history of ischemic heart disease and she had a normal nuclear stress test on 06/19/07 which showed no evidence of ischemia and her ejection fraction was 46%. Her last echocardiogram was 03/17/09 showed an ejection fraction of 55-60% and showed normal left ventricular systolic function with diastolic dysfunction, mild aortic sclerosis, mitral valve prolapse with mild mitral regurgitation, and normal pulmonary artery pressure. The patient has a past history of difficulty swallowing. 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 2006 she saw Dr. Randa Evens who repeated the time swallowing and also did a colonoscopy. The colonoscopy was incomplete because of strictures and diverticulitis. Recently the patient is started to have problems with difficulty swallowing again.   Current Outpatient Prescriptions  Medication Sig Dispense Refill  . calcium gluconate 500 MG tablet Take 500 mg by mouth daily.        . digoxin (LANOXIN) 0.125 MG tablet Take 1 tablet (125 mcg total) by mouth daily.  30 tablet  3  . multivitamin (THERAGRAN) per tablet Take 1 tablet by mouth daily.         No current facility-administered medications for this visit.    Allergies  Allergen Reactions  . Inderal [Propranolol Hcl]     Pt does not remember reaction  . Lopressor [Metoprolol Tartrate]     Per the pt' "I felt like I was going to pass out"  . Penicillins  Diarrhea    Patient Active Problem List   Diagnosis Date Noted  . Arthritis   . Atrial fibrillation   . HLD (hyperlipidemia)   . 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 Mitral valve prolapse 06/21/2011  . Swallowing dysfunction 06/21/2011  . Parathyroid adenoma 06/21/2011    History  Smoking status  . Never Smoker   Smokeless tobacco  . 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 Arthritis Father   . Heart disease Father   . Hypertension Father   . Stroke Father   . Heart attack Father   . Arthritis Mother   . Stroke Mother   . Stroke Brother   . Diabetes Brother   . Hypertension Brother   . Colon cancer Sister   . Hyperlipidemia Sister   . Hypertension Sister   . Hypertension Sister   . Esophageal cancer Neg Hx   . Rectal cancer Neg Hx   . Stomach cancer Neg Hx     Review of Systems: Constitutional: no fever chills diaphoresis or fatigue or change in weight.  Head and neck: no hearing loss, no epistaxis, no photophobia or visual disturbance. Respiratory: No cough, shortness of breath or wheezing. Cardiovascular: No chest pain peripheral edema, palpitations. Gastrointestinal: No abdominal distention, no abdominal pain, no change in bowel habits hematochezia or melena. Genitourinary: No dysuria, no frequency, no urgency, no  nocturia. Musculoskeletal:No arthralgias, no back pain, no gait disturbance or myalgias. Neurological: No dizziness, no headaches, no numbness, no seizures, no syncope, no weakness, no tremors. Hematologic: No lymphadenopathy, no easy bruising. Psychiatric: No confusion, no hallucinations, no sleep disturbance.    Physical Exam: Filed Vitals:   07/11/13 1019  BP: 128/88  Pulse: 58   the general appearance reveals a thin elderly woman in no distress.  Her weight is down 5 pounds since last year.The head and neck exam reveals pupils equal and  reactive.  Extraocular movements are full.  There is no scleral icterus.  The mouth and pharynx are normal.  The neck is supple.  The carotids reveal no bruits.  The jugular venous pressure is normal.  The  thyroid is not enlarged.  There is no lymphadenopathy.  The chest is clear to percussion and auscultation.  There are no rales or rhonchi.  Expansion of the chest is symmetrical.  The precordium is quiet.  The first heart sound is normal.  The second heart sound is physiologically split.  There is no  gallop rub or click.  At the apex there remains a faint apical systolic murmur.  There is no abnormal lift or heave.  The abdomen is soft and nontender.  The bowel sounds are normal.  The liver and spleen are not enlarged.  There are no abdominal masses.  There are no abdominal bruits.  Extremities reveal good pedal pulses.  There is no phlebitis or edema.  There is no cyanosis or clubbing.  Strength is normal and symmetrical in all extremities.  There is no lateralizing weakness.  There are no sensory deficits.  The skin is warm and dry.  There is no rash.     Assessment / Plan: The patient is to continue her current medication.  We are checking a chest x-ray looking for heart size change.  We will also have her return for a 48 hour Holter monitor to characterize her arrhythmia better. Recheck in one year for office visit and EKG

## 2013-07-11 NOTE — Progress Notes (Signed)
Patient ID: Michelle Vazquez, female   DOB: 1928/12/29, 79 y.o.   MRN: [id number] E-Cardio 48 Hour Holter Monitor applied to patient.

## 2013-07-14 ENCOUNTER — Telehealth: Payer: Self-pay | Admitting: *Deleted

## 2013-07-14 ENCOUNTER — Ambulatory Visit (INDEPENDENT_AMBULATORY_CARE_PROVIDER_SITE_OTHER)
Admission: RE | Admit: 2013-07-14 | Discharge: 2013-07-14 | Disposition: A | Discharge status: Home / Self Care | Payer: Medicare Other | Source: Ambulatory Visit | Attending: Cardiology | Admitting: Cardiology

## 2013-07-14 DIAGNOSIS — R5381 Other malaise: Secondary | ICD-10-CM

## 2013-07-14 DIAGNOSIS — R002 Palpitations: Secondary | ICD-10-CM | POA: Diagnosis not present

## 2013-07-14 NOTE — Telephone Encounter (Signed)
Message copied by Burnell Blanks on Mon Jul 14, 2013  4:30 PM ------      Message from: Cassell Clement      Created: Mon Jul 14, 2013  2:02 PM       Chest x-ray is normal.  Heart size normal.  Please report ------

## 2013-07-14 NOTE — Telephone Encounter (Signed)
Advised patient

## 2013-07-16 ENCOUNTER — Telehealth: Payer: Self-pay | Admitting: *Deleted

## 2013-07-16 DIAGNOSIS — I341 Nonrheumatic mitral (valve) prolapse: Secondary | ICD-10-CM

## 2013-07-16 DIAGNOSIS — I472 Ventricular tachycardia: Secondary | ICD-10-CM

## 2013-07-16 NOTE — Telephone Encounter (Signed)
Dr. Patty Sermons reviewed 48 hour monitor and patient has frequent monomorphic vt, he advised patient. Scheduled echo for tomorrow, patient aware

## 2013-07-17 ENCOUNTER — Ambulatory Visit (HOSPITAL_COMMUNITY): Payer: Medicare Other | Attending: Cardiology

## 2013-07-17 DIAGNOSIS — I4891 Unspecified atrial fibrillation: Secondary | ICD-10-CM | POA: Insufficient documentation

## 2013-07-17 DIAGNOSIS — I472 Ventricular tachycardia, unspecified: Secondary | ICD-10-CM | POA: Insufficient documentation

## 2013-07-17 DIAGNOSIS — I059 Rheumatic mitral valve disease, unspecified: Secondary | ICD-10-CM | POA: Diagnosis not present

## 2013-07-17 DIAGNOSIS — R002 Palpitations: Secondary | ICD-10-CM | POA: Diagnosis not present

## 2013-07-17 DIAGNOSIS — R5381 Other malaise: Secondary | ICD-10-CM | POA: Insufficient documentation

## 2013-07-17 DIAGNOSIS — I4729 Other ventricular tachycardia: Secondary | ICD-10-CM | POA: Insufficient documentation

## 2013-07-17 DIAGNOSIS — I341 Nonrheumatic mitral (valve) prolapse: Secondary | ICD-10-CM

## 2013-07-17 NOTE — Progress Notes (Signed)
Echocardiogram performed.  

## 2013-07-18 ENCOUNTER — Telehealth: Payer: Self-pay | Admitting: *Deleted

## 2013-07-18 MED ORDER — BISOPROLOL FUMARATE 5 MG PO TABS: ORAL_TABLET | ORAL | Status: DC

## 2013-07-18 MED ORDER — DIGOXIN 125 MCG PO TABS: 125.0000 ug | ORAL_TABLET | ORAL | Status: DC

## 2013-07-18 NOTE — Telephone Encounter (Signed)
Advised patient of results and medication changes  

## 2013-07-18 NOTE — Telephone Encounter (Signed)
Message copied by Burnell Blanks on Fri Jul 18, 2013  5:17 PM ------      Message from: Cassell Clement      Created: Fri Jul 18, 2013  9:04 AM       Please report.  The echocardiogram shows good left ventricular function and does show mild mitral regurgitation and mild mitral valve prolapse.  In order to control her frequent PVCs I want her to decrease her digoxin to just Monday Wednesday and Friday and I want her to start bisoprolol 5 mg tablet taking one half tablet daily. ------

## 2013-08-04 ENCOUNTER — Encounter: Payer: Self-pay | Admitting: Internal Medicine

## 2013-08-04 ENCOUNTER — Telehealth: Payer: Self-pay | Admitting: *Deleted

## 2013-08-04 ENCOUNTER — Ambulatory Visit (INDEPENDENT_AMBULATORY_CARE_PROVIDER_SITE_OTHER): Payer: Medicare Other | Admitting: Internal Medicine

## 2013-08-04 VITALS — BP 110/62 | HR 42 | Temp 98.0°F | Wt 105.8 lb

## 2013-08-04 DIAGNOSIS — I4949 Other premature depolarization: Secondary | ICD-10-CM

## 2013-08-04 DIAGNOSIS — I493 Ventricular premature depolarization: Secondary | ICD-10-CM

## 2013-08-04 DIAGNOSIS — Z23 Encounter for immunization: Secondary | ICD-10-CM | POA: Diagnosis not present

## 2013-08-04 DIAGNOSIS — R1310 Dysphagia, unspecified: Secondary | ICD-10-CM

## 2013-08-04 NOTE — Assessment & Plan Note (Addendum)
Chronic and progressive symptoms  No significant weight loss or regurgitation but significant problem with solids and soft foods Also unable to swallow caps or larger caps - prefers chewable or disolved Reviewed barium swallow 06/2011 with retained pill - ?sinus or diverticular pocket EGD by Dr Rhea Belton in September 2013 unremarkable.  By report, follow up with ENT Annalee Genta) who recommended GI re-eval due to "stricture at entrance to esophagus).  After long discussion with patient all potential options for further evaluation and treatment, will refer back to Dr Rhea Belton for evaluation of upper esophagus as cause for swallowing dysfunction.

## 2013-08-04 NOTE — Patient Instructions (Signed)
It was good to see you today. We have reviewed your prior records including labs and tests today Medications reviewed and updated, no changes recommended at this time. we'll make referral to cardiology specialist for evaluation of your PVCs. Our office will contact you regarding appointment(s) once made. I will talk with Dr Rhea Belton about what needs to be done for your "swallowing" problem as per Dr Annalee Genta - my office will call regarding followup appointment with Dr. Rhea Belton as needed Please schedule followup in 3 months for review, call sooner if problems.

## 2013-08-04 NOTE — Progress Notes (Signed)
  Subjective:    Patient ID: Michelle Vazquez, female    DOB: 05-31-1929, 79 y.o.   MRN: [id number]  HPI  Patient here today for follow up.  Chronic medical issues also reviewed -  Hyperlipidemia- has never been on statin therapy.  Last lipid panel August of 2014.   Swallowing dysfunction - barium swallow in 2003 which demonstrated presbyesophagus.  Evaluated by Dr Randa Evens in 2006 (no EGD).  Recently seen by Dr Annalee Genta (ENT) who recommended follow up with GI ([facility]).  EGD in September of 2013 by Dr Rhea Belton demonstrated normal EGD.  Palpitations - symptomatic PVC's with symptomatic bigeminy (fatigue).  Bystolic discontinued in August of 2014 due to bradycardia.  Holter monitor worn September of 2014 demonstrated 14% PVC burden with runs of monomorphic VT.  Echocardiogram demonstrated normal EF.  Following with cardiology Patty Sermons).    Past Medical History  Diagnosis Date  . Mitral valve prolapse   . Parathyroid adenoma     post parathyroid surgery in October of 2008  . Premature ventricular contractions (PVCs) (VPCs)   . Paroxysmal supraventricular tachycardia   . Arthritis   . Urinary incontinence   . Atrial fibrillation   . HLD (hyperlipidemia)   . Bowel obstruction 2000  . Squamous cell carcinoma in situ of skin     face     Review of Systems  Constitutional: Positive for fatigue. Negative for fever, chills and activity change.  HENT: Positive for trouble swallowing. Negative for neck pain and neck stiffness.   Respiratory: Negative for cough, chest tightness and shortness of breath.   Cardiovascular: Positive for palpitations. Negative for chest pain.  Gastrointestinal: Negative for nausea, vomiting, diarrhea, constipation and abdominal distention.  Neurological: Negative for dizziness, syncope and light-headedness.       Objective:   Physical Exam  Vitals reviewed. Constitutional: She is oriented to person, place, and time. She appears well-developed and  well-nourished. No distress.  HENT:  Head: Normocephalic and atraumatic.  Neck: Normal range of motion. Neck supple. No thyromegaly present.  Cardiovascular: Normal heart sounds.  A regularly irregular rhythm present. Frequent extrasystoles are present.  Pulmonary/Chest: Effort normal and breath sounds normal. No respiratory distress. She has no wheezes.  Musculoskeletal: Normal range of motion.  Lymphadenopathy:    She has no cervical adenopathy.  Neurological: She is alert and oriented to person, place, and time.  Skin: Skin is warm and dry.  Psychiatric: She has a normal mood and affect. Her behavior is normal. Judgment and thought content normal.     Wt Readings from Last 3 Encounters:  08/04/13 105 lb 12.8 oz (47.991 kg)  07/11/13 103 lb (46.72 kg)  06/24/13 104 lb 12.8 oz (47.537 kg)   BP Readings from Last 3 Encounters:  08/04/13 110/62  07/11/13 128/88  06/24/13 140/72       Assessment & Plan:   See problem list. Medications and labs reviewed today.  Time spent with pt today 25 minutes, greater than 50% time spent counseling patient on PVC symptoms, bradycardia and problems with swallowing; also medication review. Also review of prior records

## 2013-08-04 NOTE — Telephone Encounter (Signed)
Report from Dr Thurmon Fair ofc was in [facility] . He feels the esophagus needs dilating at cricopharyngeus muscle. Dr Rhea Belton, please advise. Thanks.

## 2013-08-04 NOTE — Telephone Encounter (Signed)
lmom for med rec at Dr Thurmon Fair ofc to call me.

## 2013-08-04 NOTE — Assessment & Plan Note (Addendum)
symptomatic bradycardia with bigemeny Bystolic discontinued in August of 2014 for symptomatic bradycardia and Digoxin started.  No change in symptoms Summer 2014 cardiology ordered 48 hour holter which demonstrated 14% PVC's, echocardiogram with normal LV function.   Bystolic resumed by cardiology., but pt still with symptomatic PVC's and functional bradycardia with related fatigue. Will get EP evaluation on role of AAD therapy.

## 2013-08-04 NOTE — Telephone Encounter (Signed)
Message copied by Florene Glen on Mon Aug 04, 2013  2:22 PM ------      Message from: Beverley Fiedler      Created: Mon Aug 04, 2013 12:53 PM       Thanks for the heads up      I will try to contact Annalee Genta to understand what he is seeing      Thanks      Corrie Mckusick      Please get office notes from Dr. Annalee Genta and any procedures he has performed so I can review before her OV      Thanks      JMP            ----- Message -----         From: Newt Lukes, MD         Sent: 08/04/2013  12:40 PM           To: Beverley Fiedler, MD            Hi Vonna Kotyk -             You saw this nice lady 2013 for eval of same problems (sensation of choking with swallow) and EGD appeared ok... However, because of continued problems and symptoms, she was evaluated by ENT ( [facility]) told her he could "see the problem was in her upper esophagus" and she needed to return to your care.             Just giving you a heads up - Maybe discuss directly with Annalee Genta??? Anyway, I have asked that they schedule her an office visit with you to review prior to any procedure            Thank so much and hope you are doing well!      VAL       ------

## 2013-08-05 NOTE — Telephone Encounter (Signed)
Spoke with Michelle Vazquez and offered her an appt with Dr Rhea Belton. She is scheduled for 08/28/13.

## 2013-08-06 DIAGNOSIS — K222 Esophageal obstruction: Secondary | ICD-10-CM

## 2013-08-06 HISTORY — DX: Esophageal obstruction: K22.2

## 2013-08-13 ENCOUNTER — Encounter: Payer: Self-pay | Admitting: Internal Medicine

## 2013-08-13 ENCOUNTER — Ambulatory Visit (INDEPENDENT_AMBULATORY_CARE_PROVIDER_SITE_OTHER): Payer: Medicare Other | Admitting: Internal Medicine

## 2013-08-13 VITALS — BP 103/57 | HR 43 | Wt 105.0 lb

## 2013-08-13 DIAGNOSIS — I4949 Other premature depolarization: Secondary | ICD-10-CM

## 2013-08-13 DIAGNOSIS — I493 Ventricular premature depolarization: Secondary | ICD-10-CM

## 2013-08-13 MED ORDER — FLECAINIDE ACETATE 50 MG PO TABS: 50.0000 mg | ORAL_TABLET | Freq: Two times a day (BID) | ORAL | Status: DC

## 2013-08-13 NOTE — Assessment & Plan Note (Signed)
The patient has PVCs and nonsustained ventricular tachycardia in the setting of preserved left ventricular systolic function. A combination of beta blocker therapy along with digoxin have been ineffective in controlling her symptoms. That she has had 11,000-12,000 PVCs in a 24-hour period puts her in the intermediate risk category for developing PVC induced left ventricular dysfunction. I recommended discontinuing her digoxin and starting flecainide 50 mg twice a day. If necessary, we can increase her dose to 75 mg twice a day. I will have the patient return in approximately 2 weeks for an electrocardiogram. If her EKG looks good and she is improved from a symptomatic perspective, we will have her walk on the treadmill. Otherwise we will consider up titrating her medical therapy.

## 2013-08-13 NOTE — Patient Instructions (Addendum)
Your physician has recommended you make the following change in your medication:   1. Stop Digoxin  2. Start Flecainide 50 mg twice daily  Your physician recommends that you schedule a follow-up appointment in: 2 weeks for a Nurse Visit (EKG)

## 2013-08-13 NOTE — Progress Notes (Signed)
HPI Michelle Vazquez is referred today by Dr. Felicity Coyer for evaluation of very dense ventricular ectopy. The patient is an 79 year old woman whose health has otherwise been good. She has been bothered by palpitations and feelings of fatigue and weakness for the last several months. On reflection, her symptoms may have been present for over a year but have increased recently. Subsequent evaluation demonstrates normal left ventricular systolic function. She wore a 48 hour Holter monitor which demonstrated 11,000-12,000 PVCs in a 24-hour period. She also has nonsustained ventricular tachycardia. She has never had syncope. She does have mild dizziness. During periods where her heart is beating irregularly, she will feel fatigued. She also notes weakness. She was placed on digoxin in conjunction with her beta blocker without improvement. Review of her electrocardiogram suggests that the PVCs originating either from the right ventricular outflow tract, the coronary cusp, or the epicardium. She has a prominent notch in lead V1 and V2 with an underlying left bundle branch block QRS pattern. Allergies  Allergen Reactions  . Inderal [Propranolol Hcl]     Pt does not remember reaction  . Lopressor [Metoprolol Tartrate]     Per the pt' "I felt like I was going to pass out"  . Penicillins Diarrhea     Current Outpatient Prescriptions  Medication Sig Dispense Refill  . bisoprolol (ZEBETA) 5 MG tablet 1/2 tablet daily  15 tablet  5  . calcium gluconate 500 MG tablet Take 500 mg by mouth daily.        . multivitamin (THERAGRAN) per tablet Take 1 tablet by mouth daily.        . flecainide (TAMBOCOR) 50 MG tablet Take 1 tablet (50 mg total) by mouth 2 (two) times daily.  180 tablet  3   No current facility-administered medications for this visit.     Past Medical History  Diagnosis Date  . Mitral valve prolapse   . Parathyroid adenoma     post parathyroid surgery in October of 2008  . Premature  ventricular contractions (PVCs) (VPCs)   . Paroxysmal supraventricular tachycardia   . Arthritis   . Urinary incontinence   . Atrial fibrillation   . HLD (hyperlipidemia)   . Bowel obstruction 2000  . Squamous cell carcinoma in situ of skin     face    ROS:   All systems reviewed and negative except as noted in the HPI.   Past Surgical History  Procedure Laterality Date  . Shoulder surgery  02/1999    right  . Skin lesion excision  08/1999    left side of face  . Laparoscopic small bowel resection  2000  . Breast biopsy  1979    left  . Abdominal hysterectomy  1989  . Parathyroid surgery  2008?    left  . Cataract extraction      bilateral     Family History  Problem Relation Age of Onset  . Arthritis Father   . Heart disease Father   . Hypertension Father   . Stroke Father   . Heart attack Father   . Arthritis Mother   . Stroke Mother   . Stroke Brother   . Diabetes Brother   . Hypertension Brother   . Colon cancer Sister   . Hyperlipidemia Sister   . Hypertension Sister   . Hypertension Sister   . Esophageal cancer Neg Hx   . Rectal cancer Neg Hx   . Stomach cancer Neg Hx  History   Social History  . Marital Status: Married    Spouse Name: N/A    Number of Children: 4  . Years of Education: 12+   Occupational History  . Retired     Regulatory affairs officer    Social History Main Topics  . Smoking status: Never Smoker   . 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 Alcohol Use: No  . Drug Use: No  . Sexual Activity: Not on file   Other Topics Concern  . Not on file   Social History Narrative   Regular exercise-sometimes   Caffeine Use-no     BP 103/57  Pulse 43  Wt 105 lb (47.628 kg)  BMI 19.52 kg/m2  Physical Exam:  Well appearing elderly woman, NAD HEENT: Unremarkable Neck:  6 cm JVD, no thyromegally Back:  No CVA tenderness Lungs:  Clear with no wheezes, rales, or rhonchi. HEART:   IRegular rate rhythm, no murmurs, no rubs, no clicks Abd:  soft, positive bowel sounds, no organomegally, no rebound, no guarding Ext:  2 plus pulses, no edema, no cyanosis, no clubbing Skin:  No rashes no nodules Neuro:  CN II through XII intact, motor grossly intact  EKG - normal sinus rhythm with frequent PVCs.  Assess/Plan:

## 2013-08-27 ENCOUNTER — Encounter: Payer: Self-pay | Admitting: Internal Medicine

## 2013-08-27 ENCOUNTER — Ambulatory Visit (INDEPENDENT_AMBULATORY_CARE_PROVIDER_SITE_OTHER): Payer: Medicare Other | Admitting: *Deleted

## 2013-08-27 ENCOUNTER — Ambulatory Visit: Payer: Medicare Other | Admitting: Internal Medicine

## 2013-08-27 VITALS — BP 112/72 | HR 73 | Wt 105.8 lb

## 2013-08-27 DIAGNOSIS — I493 Ventricular premature depolarization: Secondary | ICD-10-CM

## 2013-08-27 DIAGNOSIS — I4949 Other premature depolarization: Secondary | ICD-10-CM

## 2013-08-27 NOTE — Patient Instructions (Addendum)
Patient states that she feels much better, she can feel the improvement.  Patient states that she takes Bisoprolol only 3 times per week, but it currently lists her as taking it daily. She tells me that Dr. Ladona Ridgel instructed her to take it 3 times per week. He also states in his last office visit that if EKG looks good and patient is improved then he wanted to do treadmill testing.   I will forward to Dr. Ladona Ridgel and Tresa Endo (his nurse) to review medication and treadmill.

## 2013-08-28 ENCOUNTER — Ambulatory Visit (INDEPENDENT_AMBULATORY_CARE_PROVIDER_SITE_OTHER): Payer: Medicare Other | Admitting: Internal Medicine

## 2013-08-28 ENCOUNTER — Encounter: Payer: Self-pay | Admitting: Internal Medicine

## 2013-08-28 VITALS — BP 120/76 | HR 60 | Ht 61.5 in | Wt 104.0 lb

## 2013-08-28 DIAGNOSIS — R1314 Dysphagia, pharyngoesophageal phase: Secondary | ICD-10-CM | POA: Insufficient documentation

## 2013-08-28 DIAGNOSIS — R1310 Dysphagia, unspecified: Secondary | ICD-10-CM | POA: Diagnosis not present

## 2013-08-28 NOTE — Patient Instructions (Signed)
You have been scheduled for an endoscopy with Dialation with propofol. Please follow written instructions given to you at your visit today. If you use inhalers (even only as needed), please bring them with you on the day of your procedure. Your physician has requested that you go to www.[email or web address].com and enter the access code given to you at your visit today. This web site gives a general overview about your procedure. However, you should still follow specific instructions given to you by our office regarding your preparation for the procedure.                                                We are excited to introduce MyChart, a new best-in-class service that provides you online access to important information in your electronic medical record. We want to make it easier for you to view your health information - all in one secure location - when and where you need it. We expect MyChart will enhance the quality of care and service we provide.  When you register for MyChart, you can:    View your test results.    Request appointments and receive appointment reminders via email.    Request medication renewals.    View your medical history, allergies, medications and immunizations.    Communicate with your physician's office through a password-protected site.    Conveniently print information such as your medication lists.  To find out if MyChart is right for you, please talk to a member of our clinical staff today. We will gladly answer your questions about this free health and wellness tool.  If you are age 54 or older and want a member of your family to have access to your record, you must provide written consent by completing a proxy form available at our office. Please speak to our clinical staff about guidelines regarding accounts for patients younger than age 58.  As you activate your MyChart account and need any technical assistance, please call the MyChart technical support line at  [phone]-CHART [phone]) or email your question to [email or web address]. If you email your question(s), please include your name, a return phone number and the best time to reach you.  If you have non-urgent health-related questions, you can send a message to our office through MyChart at [email or web address]. If you have a medical emergency, call 911.  Thank you for using MyChart as your new health and wellness resource!   MyChart licensed from [employer],  4540-9811. Patents Pending.

## 2013-08-28 NOTE — Progress Notes (Signed)
Subjective:    Patient ID: Michelle Vazquez, female    DOB: Nov 25, 1928, 79 y.o.   MRN: [id number]  HPI Michelle Vazquez is an 79 year old female with a past medical history of parathyroid adenoma status post left parathyroidectomy, mitral valve prolapse, PVCs, osteoarthritis, atrial fibrillation, hyperlipidemia who seen in followup for dysphagia. She was initially seen about a year ago to evaluate dysphagia and came for upper endoscopy on 07/29/2012. This was normal. She reports continued issues with swallowing. Specifically, she reports trouble getting food into her esophagus. This is worse with soft foods such as mashed potatoes. She reports foods with "bulk" are easier to swallow for her. She reports symptoms are stable over the last year, though she has seen an ear nose and throat doctor who felt her issues were related to her upper esophageal sphincter. She denies abdominal pain. No nausea or vomiting. No heartburn. No odynophagia. No change in her bowel habits including no blood in her stool or melena.  She is interested in esophageal dilatation  Review of Systems As per history of present illness, otherwise negative  Current Medications, Allergies, Past Medical History, Past Surgical History, Family History and Social History were reviewed in [employer] record.     Objective:   Physical Exam BP 120/76  Pulse 60  Ht 5' 1.5" (1.562 m)  Wt 104 lb (47.174 kg)  BMI 19.33 kg/m2 Constitutional: Well-developed and well-nourished. No distress. HEENT: Normocephalic and atraumatic.  No scleral icterus. Neurological: Alert and oriented to person place and time. Psychiatric: Normal mood and affect. Behavior is normal.  ESOPHOGRAM/BARIUM SWALLOW - Aug 2012   Technique:  Combined double contrast and single contrast examination performed using effervescent crystals, thick barium liquid, and thin barium liquid.   Fluoroscopy time:  4.4 minutes.   Comparison:  Barium swallow  of 04/13/2005   Findings:  A double contrast study shows the mucosa of the esophagus to be unremarkable.  A single contrast study shows no aspiration.  The swallowing mechanism is unremarkable.  Slight prominence of the cricopharyngeus muscle is noted. As noted on prior study the primary esophageal peristaltic wave is diminished, with the esophagus being somewhat a peristaltic.  No hiatal hernia is seen.  No reflux could be demonstrated.  A barium pill was given at the end of the study which lodged within the left piriform sinus and did not pass after repeated swallows.  The patient was given additional liquid and the tablet did dissolve.   IMPRESSION:   1.  Poor primary peristaltic wave as noted on the study of 2006 with mild tertiary contractions. 2.  Slightly prominent cricopharyngeus muscle. 3.  Barium pill lodged in the left piriform sinus.     Assessment & Plan:  79 year old female with a past medical history of parathyroid adenoma status post left parathyroidectomy, mitral valve prolapse, PVCs, osteoarthritis, atrial fibrillation, hyperlipidemia who seen in followup for dysphagia.  1.  Dysphagia -- it is possible that her dysphagia symptoms relate to her upper esophageal sphincter. There swallow did not show evidence of Zenker's diverticulum. It did however show some element of esophageal dysmotility. We discussed esophageal dysmotility and how treatment for this issue is very limited. Esophageal dilation, particularly across the upper esophageal sphincter may benefit her swallowing dysfunction, and we discussed this test including the risks and benefits and she is agreeable to proceed. We also discussed esophageal manometry to evaluate upper esophageal sphincter pressures, with the knowledge that this is diagnostic only. After this discussion she prefers  to proceed with repeat EGD and likely dilation.  We discussed other therapies such as Botox injection to the upper esophageal  sphincter, but this is rarely done and would need to be done in a tertiary Medical Center if necessary. I do agree that a trial of empiric dilation is the first that before more advanced testing or interventions.

## 2013-09-01 ENCOUNTER — Ambulatory Visit (AMBULATORY_SURGERY_CENTER): Payer: Medicare Other | Admitting: Internal Medicine

## 2013-09-01 ENCOUNTER — Encounter: Payer: Self-pay | Admitting: Internal Medicine

## 2013-09-01 VITALS — BP 139/110 | HR 61 | Temp 97.2°F | Resp 16 | Ht 61.5 in | Wt 104.0 lb

## 2013-09-01 DIAGNOSIS — I10 Essential (primary) hypertension: Secondary | ICD-10-CM | POA: Diagnosis not present

## 2013-09-01 DIAGNOSIS — I4891 Unspecified atrial fibrillation: Secondary | ICD-10-CM | POA: Diagnosis not present

## 2013-09-01 DIAGNOSIS — I059 Rheumatic mitral valve disease, unspecified: Secondary | ICD-10-CM | POA: Diagnosis not present

## 2013-09-01 DIAGNOSIS — I4949 Other premature depolarization: Secondary | ICD-10-CM | POA: Diagnosis not present

## 2013-09-01 DIAGNOSIS — R1310 Dysphagia, unspecified: Secondary | ICD-10-CM

## 2013-09-01 MED ORDER — SODIUM CHLORIDE 0.9 % IV SOLN: 500.0000 mL | INTRAVENOUS | Status: DC

## 2013-09-01 NOTE — Op Note (Signed)
[facility] [address] [place], [address]   ENDOSCOPY PROCEDURE REPORT  PATIENT: Michelle Vazquez, Michelle Vazquez  MR#: [id number] BIRTHDATE: [birth year] , 83  yrs. old GENDER: Female ENDOSCOPIST: Beverley Fiedler, MD PROCEDURE DATE:  09/01/2013 PROCEDURE:  Savary dilation of esophagus ASA CLASS:     Class III INDICATIONS:  Dysphagia. MEDICATIONS: MAC sedation, administered by CRNA and propofol (Diprivan) 200mg  IV TOPICAL ANESTHETIC: Cetacaine Spray  DESCRIPTION OF PROCEDURE: After the risks benefits and alternatives of the procedure were thoroughly explained, informed consent was obtained.  The LB [id number] endoscope was introduced through the mouth and advanced to the second portion of the duodenum. Without limitations.  The instrument was slowly withdrawn as the mucosa was fully examined.    ESOPHAGUS: Moderate resistance to passage of the upper endoscope into the esophagus across the upper esophageal sphincter.  The mucosa of the esophagus appeared normal.   A nonobstructing Schatzki ring was found at the gastroesophageal junction, 38 cm from the incisors.   Dilation was performed with savory dilator over a guidewire using a 15 mm savory.  2 passes were made, the second with less resistance.  Post dilation views revealed erythema without obvious mucosal tear.  STOMACH: The mucosa of the stomach appeared normal.  DUODENUM: The duodenal mucosa showed no abnormalities in the bulb and second portion of the duodenum.  Retroflexed views revealed no abnormalities.     The scope was then withdrawn from the patient and the procedure completed.  COMPLICATIONS: There were no complications.  ENDOSCOPIC IMPRESSION: 1.   Moderate resistance at UES, dilation to 15 mm with Savory over guidewire. 2.   The mucosa of the esophagus appeared normal 3.   Nonobstructing Schatzki ring was found at the gastroesophageal junction 3.   The mucosa of the stomach appeared normal 4.    The duodenal mucosa showed no abnormalities in the bulb and second portion of the duodenum  RECOMMENDATIONS: 1.  Soft diet the remainder of today 2.  Office follow-up in 3-4 weeks.  eSigned:  Beverley Fiedler, MD 09/01/2013 2:00 PM   CC:The Patient and Newt Lukes, MD  PATIENT NAME:  Michelle Vazquez, Michelle Vazquez MR#: [id number]

## 2013-09-01 NOTE — Progress Notes (Signed)
Called to room to assist during endoscopic procedure.  Patient ID and intended procedure confirmed with present staff. Received instructions for my participation in the procedure from the performing physician. ewm 

## 2013-09-01 NOTE — Patient Instructions (Signed)
Discharge instructions given with verbal understanding. Handout on a dilatation diet. Resume previous medication. YOU HAD AN ENDOSCOPIC PROCEDURE TODAY AT THE  ENDOSCOPY CENTER: Refer to the procedure report that was given to you for any specific questions about what was found during the examination.  If the procedure report does not answer your questions, please call your gastroenterologist to clarify.  If you requested that your care partner not be given the details of your procedure findings, then the procedure report has been included in a sealed envelope for you to review at your convenience later.  YOU SHOULD EXPECT: Some feelings of bloating in the abdomen. Passage of more gas than usual.  Walking can help get rid of the air that was put into your GI tract during the procedure and reduce the bloating. If you had a lower endoscopy (such as a colonoscopy or flexible sigmoidoscopy) you may notice spotting of blood in your stool or on the toilet paper. If you underwent a bowel prep for your procedure, then you may not have a normal bowel movement for a few days.  DIET: Your first meal following the procedure should be a light meal and then it is ok to progress to your normal diet.  A half-sandwich or bowl of soup is an example of a good first meal.  Heavy or fried foods are harder to digest and may make you feel nauseous or bloated.  Likewise meals heavy in dairy and vegetables can cause extra gas to form and this can also increase the bloating.  Drink plenty of fluids but you should avoid alcoholic beverages for 24 hours.  ACTIVITY: Your care partner should take you home directly after the procedure.  You should plan to take it easy, moving slowly for the rest of the day.  You can resume normal activity the day after the procedure however you should NOT DRIVE or use heavy machinery for 24 hours (because of the sedation medicines used during the test).    SYMPTOMS TO REPORT IMMEDIATELY: A  gastroenterologist can be reached at any hour.  During normal business hours, 8:30 AM to 5:00 PM Monday through Friday, call [phone].  After hours and on weekends, please call the GI answering service at [phone] who will take a message and have the physician on call contact you.   Following upper endoscopy (EGD)  Vomiting of blood or coffee ground material  New chest pain or pain under the shoulder blades  Painful or persistently difficult swallowing  New shortness of breath  Fever of 100F or higher  Black, tarry-looking stools  FOLLOW UP: If any biopsies were taken you will be contacted by phone or by letter within the next 1-3 weeks.  Call your gastroenterologist if you have not heard about the biopsies in 3 weeks.  Our staff will call the home number listed on your records the next business day following your procedure to check on you and address any questions or concerns that you may have at that time regarding the information given to you following your procedure. This is a courtesy call and so if there is no answer at the home number and we have not heard from you through the emergency physician on call, we will assume that you have returned to your regular daily activities without incident.  SIGNATURES/CONFIDENTIALITY: You and/or your care partner have signed paperwork which will be entered into your electronic medical record.  These signatures attest to the fact that that the information above  on your After Visit Summary has been reviewed and is understood.  Full responsibility of the confidentiality of this discharge information lies with you and/or your care-partner.

## 2013-09-01 NOTE — Progress Notes (Signed)
Report to pacu rn, vss, bbs=clear 

## 2013-09-01 NOTE — Progress Notes (Signed)
Patient did not experience any of the following events: a burn prior to discharge; a fall within the facility; wrong site/side/patient/procedure/implant event; or a hospital transfer or hospital admission upon discharge from the facility. (G8907) Patient did not have preoperative order for IV antibiotic SSI prophylaxis. (G8918)  

## 2013-09-02 ENCOUNTER — Telehealth: Payer: Self-pay

## 2013-09-02 NOTE — Telephone Encounter (Signed)
  Follow up Call-  Call back number 09/01/2013 07/29/2012  Post procedure Call Back phone  # [phone] hm [phone] hm  Permission to leave phone message Yes Yes     Patient questions:  Do you have a fever, pain , or abdominal swelling? no Pain Score  0 *  Have you tolerated food without any problems? yes  Have you been able to return to your normal activities? yes  Do you have any questions about your discharge instructions: Diet   no Medications  no Follow up visit  no  Do you have questions or concerns about your Care? no  Actions: * If pain score is 4 or above: No action needed, pain <4.

## 2013-09-04 ENCOUNTER — Telehealth: Payer: Self-pay | Admitting: Internal Medicine

## 2013-09-04 DIAGNOSIS — I493 Ventricular premature depolarization: Secondary | ICD-10-CM

## 2013-09-04 NOTE — Telephone Encounter (Signed)
She feels better  We will go ahead and schedule a GXT  in the next few weeks per Dr Ladona Ridgel

## 2013-09-04 NOTE — Telephone Encounter (Signed)
New Problem     Pt is following up on suggested Stress test.    Pt was told she may need to schedule this and to call if she did not get a call about it.     Thanks!

## 2013-09-05 ENCOUNTER — Encounter: Payer: Self-pay | Admitting: *Deleted

## 2013-09-05 NOTE — Telephone Encounter (Signed)
Error

## 2013-09-23 ENCOUNTER — Encounter: Payer: Self-pay | Admitting: Internal Medicine

## 2013-09-24 ENCOUNTER — Encounter (HOSPITAL_COMMUNITY): Payer: Self-pay | Admitting: Emergency Medicine

## 2013-09-24 ENCOUNTER — Emergency Department (HOSPITAL_COMMUNITY)
Admission: EM | Admit: 2013-09-24 | Discharge: 2013-09-24 | Disposition: A | Discharge status: Home / Self Care | Payer: Medicare Other | Attending: Emergency Medicine | Admitting: Emergency Medicine

## 2013-09-24 ENCOUNTER — Emergency Department (HOSPITAL_COMMUNITY): Payer: Medicare Other

## 2013-09-24 DIAGNOSIS — X500XXA Overexertion from strenuous movement or load, initial encounter: Secondary | ICD-10-CM | POA: Insufficient documentation

## 2013-09-24 DIAGNOSIS — Z8739 Personal history of other diseases of the musculoskeletal system and connective tissue: Secondary | ICD-10-CM | POA: Diagnosis not present

## 2013-09-24 DIAGNOSIS — I4891 Unspecified atrial fibrillation: Secondary | ICD-10-CM | POA: Diagnosis not present

## 2013-09-24 DIAGNOSIS — R112 Nausea with vomiting, unspecified: Secondary | ICD-10-CM | POA: Diagnosis not present

## 2013-09-24 DIAGNOSIS — Z79899 Other long term (current) drug therapy: Secondary | ICD-10-CM | POA: Diagnosis not present

## 2013-09-24 DIAGNOSIS — S0990XA Unspecified injury of head, initial encounter: Secondary | ICD-10-CM | POA: Diagnosis not present

## 2013-09-24 DIAGNOSIS — S8990XA Unspecified injury of unspecified lower leg, initial encounter: Secondary | ICD-10-CM | POA: Insufficient documentation

## 2013-09-24 DIAGNOSIS — Z872 Personal history of diseases of the skin and subcutaneous tissue: Secondary | ICD-10-CM | POA: Diagnosis not present

## 2013-09-24 DIAGNOSIS — Z862 Personal history of diseases of the blood and blood-forming organs and certain disorders involving the immune mechanism: Secondary | ICD-10-CM | POA: Diagnosis not present

## 2013-09-24 DIAGNOSIS — Z880 Allergy status to penicillin: Secondary | ICD-10-CM | POA: Insufficient documentation

## 2013-09-24 DIAGNOSIS — R011 Cardiac murmur, unspecified: Secondary | ICD-10-CM | POA: Insufficient documentation

## 2013-09-24 DIAGNOSIS — S0191XA Laceration without foreign body of unspecified part of head, initial encounter: Secondary | ICD-10-CM

## 2013-09-24 DIAGNOSIS — M25579 Pain in unspecified ankle and joints of unspecified foot: Secondary | ICD-10-CM | POA: Diagnosis not present

## 2013-09-24 DIAGNOSIS — W1809XA Striking against other object with subsequent fall, initial encounter: Secondary | ICD-10-CM | POA: Insufficient documentation

## 2013-09-24 DIAGNOSIS — Z8719 Personal history of other diseases of the digestive system: Secondary | ICD-10-CM | POA: Diagnosis not present

## 2013-09-24 DIAGNOSIS — S0100XA Unspecified open wound of scalp, initial encounter: Secondary | ICD-10-CM | POA: Diagnosis not present

## 2013-09-24 DIAGNOSIS — Y9301 Activity, walking, marching and hiking: Secondary | ICD-10-CM | POA: Insufficient documentation

## 2013-09-24 DIAGNOSIS — Z8669 Personal history of other diseases of the nervous system and sense organs: Secondary | ICD-10-CM | POA: Insufficient documentation

## 2013-09-24 DIAGNOSIS — Z8639 Personal history of other endocrine, nutritional and metabolic disease: Secondary | ICD-10-CM | POA: Insufficient documentation

## 2013-09-24 DIAGNOSIS — Z0389 Encounter for observation for other suspected diseases and conditions ruled out: Secondary | ICD-10-CM | POA: Diagnosis not present

## 2013-09-24 DIAGNOSIS — S060X0A Concussion without loss of consciousness, initial encounter: Secondary | ICD-10-CM | POA: Diagnosis not present

## 2013-09-24 DIAGNOSIS — Y9229 Other specified public building as the place of occurrence of the external cause: Secondary | ICD-10-CM | POA: Insufficient documentation

## 2013-09-24 NOTE — ED Notes (Signed)
Pt states that EMS came out to the scene and she told them she would come herself.

## 2013-09-24 NOTE — ED Provider Notes (Signed)
I saw and evaluated the patient, reviewed the resident's note and I agree with the findings and he closure.  EKG Interpretation    Date/Time:  Wednesday September 24 2013 12:16:32 EST Ventricular Rate:  71 PR Interval:  242 QRS Duration: 99 QT Interval:  463 QTC Calculation: 503 R Axis:   78 Text Interpretation:  Sinus rhythm Prolonged PR interval Anterior infarct, old Prolonged QT interval since last tracing  qt interval and pr interval increased Confirmed by Isahi Godwin  MD-J, Landree Fernholz ([id number]) on 09/24/2013 12:23:42 PM            Patient states she tripped and fell hitting her head. Patient did not have a syncopal episode. An EKG was done in triage although not necessarily clinically indicated.    On exam the patient is in no distress. She does have small laceration on the scalp.  We will staple the wound.  Follow up with ortho regarding her ankle injury, place in a cam walker.  Celene Kras, MD 09/24/13 [phone]

## 2013-09-24 NOTE — ED Notes (Signed)
Pt was at church walking down the stairs talking fast and not holding on to the rail and slipped and fell hitting the left side of her head and twist her left ankle that has pain with weightbearing activity.

## 2013-09-24 NOTE — ED Provider Notes (Addendum)
CSN: [id number]     Arrival date & time 09/24/13  1149 History   First MD Initiated Contact with Patient 09/24/13 1303     Chief Complaint  Patient presents with  . Fall  . Head Laceration  . Ankle Pain    left   (Consider location/radiation/quality/duration/timing/severity/associated sxs/prior Treatment) Patient is a 79 y.o. female presenting with fall, scalp laceration, and ankle pain. The history is provided by the patient.  Fall This is a new problem. The current episode started today. The problem has been unchanged. Pertinent negatives include no abdominal pain, anorexia, arthralgias, change in bowel habit, chest pain, chills, congestion, coughing, diaphoresis, fatigue, fever, headaches, joint swelling, myalgias, nausea, neck pain, numbness, rash, sore throat, swollen glands, urinary symptoms, vertigo, visual change, vomiting or weakness. Nothing aggravates the symptoms. She has tried nothing for the symptoms. The treatment provided no relief.  Head Laceration Pertinent negatives include no abdominal pain, anorexia, arthralgias, change in bowel habit, chest pain, chills, congestion, coughing, diaphoresis, fatigue, fever, headaches, joint swelling, myalgias, nausea, neck pain, numbness, rash, sore throat, swollen glands, urinary symptoms, vertigo, visual change, vomiting or weakness.  Ankle Pain Associated symptoms: no fatigue, no fever and no neck pain     Past Medical History  Diagnosis Date  . Mitral valve prolapse   . Parathyroid adenoma     post parathyroid surgery in October of 2008  . Premature ventricular contractions (PVCs) (VPCs)   . Paroxysmal supraventricular tachycardia   . Arthritis   . Urinary incontinence   . Atrial fibrillation   . HLD (hyperlipidemia)   . Bowel obstruction 2000  . Squamous cell carcinoma in situ of skin     face  . Cancer     facial squamous cell skin ca  . Cataract     bilateral removed  . Heart murmur   . Osteoporosis   . Thyroid  disease     parathyroid adenoma  . Schatzki's ring 2014    Dr. Rhea Belton   Past Surgical History  Procedure Laterality Date  . Shoulder surgery  02/1999    right  . Skin lesion excision  08/1999    left side of face  . Laparoscopic small bowel resection  2000  . Breast biopsy  1979    left  . Abdominal hysterectomy  1989  . Parathyroid surgery  2008?    left  . Cataract extraction      bilateral   Family History  Problem Relation Age of Onset  . Arthritis Father   . Heart disease Father   . Hypertension Father   . Stroke Father   . Heart attack Father   . Arthritis Mother   . Stroke Mother   . Stroke Brother   . Diabetes Brother   . Hypertension Brother   . Colon cancer Sister   . Hyperlipidemia Sister   . Hypertension Sister   . Hypertension Sister   . Esophageal cancer Neg Hx   . Rectal cancer Neg Hx   . Stomach cancer Neg Hx    History  Substance Use Topics  . Smoking status: Never Smoker   . 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 Alcohol Use: No   OB History   Grav Para Term Preterm Abortions TAB SAB Ect Mult Living                 Review of Systems  Constitutional: Negative for fever, chills,  diaphoresis and fatigue.  HENT: Negative for congestion and sore throat.   Respiratory: Negative for cough.   Cardiovascular: Negative for chest pain.  Gastrointestinal: Negative for nausea, vomiting, abdominal pain, anorexia and change in bowel habit.  Musculoskeletal: Negative for arthralgias, joint swelling, myalgias and neck pain.  Skin: Negative for rash.  Neurological: Negative for vertigo, weakness, numbness and headaches.    Allergies  Inderal; Lopressor; and Penicillins  Home Medications   Current Outpatient Rx  Name  Route  Sig  Dispense  Refill  . bisoprolol (ZEBETA) 5 MG tablet   Oral   Take 2.5 mg by mouth 3 (three) times a week. Monday, Wednesday and Friday         . calcium gluconate 500 MG  tablet   Oral   Take 500 mg by mouth daily.           . flecainide (TAMBOCOR) 50 MG tablet   Oral   Take 1 tablet (50 mg total) by mouth 2 (two) times daily.   180 tablet   3   . Multiple Vitamin (MULTIVITAMIN WITH MINERALS) TABS tablet   Oral   Take 1 tablet by mouth daily.          BP 164/81  Pulse 77  Temp(Src) 97.9 F (36.6 C) (Oral)  Resp 19  SpO2 99% Physical Exam  Constitutional: She is oriented to person, place, and time. She appears well-developed and well-nourished. No distress.  HENT:  Head: Normocephalic.  Mouth/Throat: Oropharynx is clear and moist. No oropharyngeal exudate.  Laceration to left cranium  Eyes: Conjunctivae and EOM are normal. Pupils are equal, round, and reactive to light.  Cardiovascular: Normal rate, normal heart sounds and intact distal pulses.  Exam reveals no gallop and no friction rub.   No murmur heard. Pulmonary/Chest: Effort normal and breath sounds normal. No respiratory distress. She has no wheezes. She has no rales.  Abdominal: Soft. Bowel sounds are normal. She exhibits no distension. There is no tenderness.  Musculoskeletal: She exhibits tenderness. She exhibits no edema.  Tender in left ankle  Neurological: She is alert and oriented to person, place, and time. She has normal reflexes. No cranial nerve deficit.  Skin: She is not diaphoretic.  Psychiatric: She has a normal mood and affect. Her behavior is normal.    ED Course  LACERATION REPAIR Date/Time: 09/24/2013 3:37 PM Performed by: Pleas Koch Authorized by: Linwood Dibbles R Consent: Verbal consent obtained. Risks and benefits: risks, benefits and alternatives were discussed Consent given by: patient Patient understanding: patient states understanding of the procedure being performed Patient consent: the patient's understanding of the procedure matches consent given Procedure consent: procedure consent matches procedure scheduled Relevant documents: relevant  documents present and verified Test results: test results available and properly labeled Site marked: the operative site was marked Required items: required blood products, implants, devices, and special equipment available Patient identity confirmed: verbally with patient Time out: Immediately prior to procedure a "time out" was called to verify the correct patient, procedure, equipment, support staff and site/side marked as required. Body area: head/neck Location details: scalp Laceration length: 5 cm Foreign bodies: no foreign bodies Tendon involvement: none Nerve involvement: none Vascular damage: no Anesthesia: local infiltration Local anesthetic: bupivacaine 0.25% without epinephrine Anesthetic total: 5 ml Patient sedated: no Preparation: Patient was prepped and draped in the usual sterile fashion. Irrigation solution: saline Irrigation method: syringe Amount of cleaning: standard Debridement: minimal Degree of undermining: none Skin closure: staples (6 staples) Approximation: close  Approximation difficulty: simple Dressing: 4x4 sterile gauze and gauze roll Patient tolerance: Patient tolerated the procedure well with no immediate complications.   (including critical care time) Labs Review Labs Reviewed - No data to display Imaging Review Dg Ankle Complete Left  09/24/2013   CLINICAL DATA:  Fall.  Pain.  EXAM: LEFT ANKLE COMPLETE - 3+ VIEW  COMPARISON:  None.  FINDINGS: Mild soft tissue swelling is noted along the anterior ankle. Small bony density is noted along the dorsal aspect of the distal talus on lateral view. Tiny avulsion fracture arising from the distal talus could present in this fashion. This may represent an old injury. No other focal abnormalities are identified. Ankle is otherwise intact.  IMPRESSION: Small duct bony density noted along the dorsal aspect of the distal talus on lateral view. This could represent a tiny fracture. No other focal bony abnormalities  identified. Malleoli are intact.   Electronically Signed   By: Maisie Fus  Register   On: 09/24/2013 12:42    EKG Interpretation    Date/Time:  Wednesday September 24 2013 12:16:32 EST Ventricular Rate:  71 PR Interval:  242 QRS Duration: 99 QT Interval:  463 QTC Calculation: 503 R Axis:   78 Text Interpretation:  Sinus rhythm Prolonged PR interval Anterior infarct, old Prolonged QT interval since last tracing  qt interval and pr interval increased Confirmed by KNAPP  MD-J, JON ([id number]) on 09/24/2013 12:23:42 PM            MDM   1. Laceration of head, initial encounter     1. Laceration I applied 6 staples to the laceration after injection of local anesthetic. There was good approximation of the skin. The patient tolerated the procedure without problems. I recommende thepatient f/u with PCP in 1 week for staple removal.  2. Ankle pain Possible tiny avulsion fracture of ankle. Recommended patient to keep elevated, use OTC pain medications as needed and f/u with orthopedic specialist.     Pleas Koch, MD 09/24/13 1451  Pleas Koch, MD 09/24/13 1539

## 2013-09-25 ENCOUNTER — Emergency Department (HOSPITAL_COMMUNITY)
Admission: EM | Admit: 2013-09-25 | Discharge: 2013-09-25 | Disposition: A | Discharge status: Home / Self Care | Payer: Medicare Other | Attending: Emergency Medicine | Admitting: Emergency Medicine

## 2013-09-25 ENCOUNTER — Emergency Department (HOSPITAL_COMMUNITY): Payer: Medicare Other

## 2013-09-25 ENCOUNTER — Encounter (HOSPITAL_COMMUNITY): Payer: Self-pay | Admitting: Emergency Medicine

## 2013-09-25 DIAGNOSIS — R110 Nausea: Secondary | ICD-10-CM | POA: Diagnosis not present

## 2013-09-25 DIAGNOSIS — I059 Rheumatic mitral valve disease, unspecified: Secondary | ICD-10-CM | POA: Diagnosis not present

## 2013-09-25 DIAGNOSIS — Z8669 Personal history of other diseases of the nervous system and sense organs: Secondary | ICD-10-CM | POA: Diagnosis not present

## 2013-09-25 DIAGNOSIS — I4891 Unspecified atrial fibrillation: Secondary | ICD-10-CM | POA: Insufficient documentation

## 2013-09-25 DIAGNOSIS — Q391 Atresia of esophagus with tracheo-esophageal fistula: Secondary | ICD-10-CM | POA: Insufficient documentation

## 2013-09-25 DIAGNOSIS — M810 Age-related osteoporosis without current pathological fracture: Secondary | ICD-10-CM | POA: Insufficient documentation

## 2013-09-25 DIAGNOSIS — Z79899 Other long term (current) drug therapy: Secondary | ICD-10-CM | POA: Diagnosis not present

## 2013-09-25 DIAGNOSIS — Z880 Allergy status to penicillin: Secondary | ICD-10-CM | POA: Diagnosis not present

## 2013-09-25 DIAGNOSIS — Z85828 Personal history of other malignant neoplasm of skin: Secondary | ICD-10-CM | POA: Diagnosis not present

## 2013-09-25 DIAGNOSIS — R011 Cardiac murmur, unspecified: Secondary | ICD-10-CM | POA: Insufficient documentation

## 2013-09-25 DIAGNOSIS — Y929 Unspecified place or not applicable: Secondary | ICD-10-CM | POA: Insufficient documentation

## 2013-09-25 DIAGNOSIS — Z8739 Personal history of other diseases of the musculoskeletal system and connective tissue: Secondary | ICD-10-CM | POA: Insufficient documentation

## 2013-09-25 DIAGNOSIS — S060X9A Concussion with loss of consciousness of unspecified duration, initial encounter: Secondary | ICD-10-CM | POA: Diagnosis not present

## 2013-09-25 DIAGNOSIS — Z862 Personal history of diseases of the blood and blood-forming organs and certain disorders involving the immune mechanism: Secondary | ICD-10-CM | POA: Insufficient documentation

## 2013-09-25 DIAGNOSIS — S060X0A Concussion without loss of consciousness, initial encounter: Secondary | ICD-10-CM

## 2013-09-25 DIAGNOSIS — Z8719 Personal history of other diseases of the digestive system: Secondary | ICD-10-CM | POA: Diagnosis not present

## 2013-09-25 DIAGNOSIS — S0990XD Unspecified injury of head, subsequent encounter: Secondary | ICD-10-CM

## 2013-09-25 DIAGNOSIS — G479 Sleep disorder, unspecified: Secondary | ICD-10-CM | POA: Insufficient documentation

## 2013-09-25 DIAGNOSIS — Y939 Activity, unspecified: Secondary | ICD-10-CM | POA: Insufficient documentation

## 2013-09-25 DIAGNOSIS — W010XXA Fall on same level from slipping, tripping and stumbling without subsequent striking against object, initial encounter: Secondary | ICD-10-CM | POA: Insufficient documentation

## 2013-09-25 DIAGNOSIS — S0990XA Unspecified injury of head, initial encounter: Secondary | ICD-10-CM | POA: Diagnosis not present

## 2013-09-25 DIAGNOSIS — R112 Nausea with vomiting, unspecified: Secondary | ICD-10-CM | POA: Diagnosis not present

## 2013-09-25 DIAGNOSIS — Z8639 Personal history of other endocrine, nutritional and metabolic disease: Secondary | ICD-10-CM | POA: Insufficient documentation

## 2013-09-25 MED ORDER — ONDANSETRON 4 MG PO TBDP
4.0000 mg | ORAL_TABLET | Freq: Once | ORAL | Status: AC
  Administered 2013-09-25: 4 mg via ORAL
  Filled 2013-09-25: qty 1

## 2013-09-25 MED ORDER — ONDANSETRON 4 MG PO TBDP: ORAL_TABLET | ORAL | Status: DC

## 2013-09-25 NOTE — ED Notes (Signed)
Patient was seen yesterday for a head injury with LOC. Patient stated that she could not sleep last night and began having nausea. Patient denies any vision problems, weakness.

## 2013-09-26 ENCOUNTER — Encounter: Payer: Self-pay | Admitting: Internal Medicine

## 2013-09-26 ENCOUNTER — Ambulatory Visit (INDEPENDENT_AMBULATORY_CARE_PROVIDER_SITE_OTHER): Payer: Medicare Other | Admitting: Internal Medicine

## 2013-09-26 VITALS — BP 108/68 | HR 78 | Ht 61.5 in | Wt 106.0 lb

## 2013-09-26 DIAGNOSIS — R1310 Dysphagia, unspecified: Secondary | ICD-10-CM

## 2013-09-26 DIAGNOSIS — K224 Dyskinesia of esophagus: Secondary | ICD-10-CM | POA: Diagnosis not present

## 2013-09-26 DIAGNOSIS — R1314 Dysphagia, pharyngoesophageal phase: Secondary | ICD-10-CM | POA: Diagnosis not present

## 2013-09-26 NOTE — Patient Instructions (Signed)
Dr. Rhea Belton recommends you use peppermint Altoids before meals for Esophageal spasm. Drink Ensure or boost to supplement calories.                                                We are excited to introduce MyChart, a new best-in-class service that provides you online access to important information in your electronic medical record. We want to make it easier for you to view your health information - all in one secure location - when and where you need it. We expect MyChart will enhance the quality of care and service we provide.  When you register for MyChart, you can:    View your test results.    Request appointments and receive appointment reminders via email.    Request medication renewals.    View your medical history, allergies, medications and immunizations.    Communicate with your physician's office through a password-protected site.    Conveniently print information such as your medication lists.  To find out if MyChart is right for you, please talk to a member of our clinical staff today. We will gladly answer your questions about this free health and wellness tool.  If you are age 77 or older and want a member of your family to have access to your record, you must provide written consent by completing a proxy form available at our office. Please speak to our clinical staff about guidelines regarding accounts for patients younger than age 35.  As you activate your MyChart account and need any technical assistance, please call the MyChart technical support line at [phone]) or email your question to [email or web address]. If you email your question(s), please include your name, a return phone number and the best time to reach you.  If you have non-urgent health-related questions, you can send a message to our office through MyChart at [email or web address]. If you have a medical emergency, call 911.  Thank you for using MyChart as your new health and  wellness resource!   MyChart licensed from [employer],  [id number]. Patents Pending. '

## 2013-09-26 NOTE — Progress Notes (Signed)
Subjective:    Patient ID: Michelle Vazquez, female    DOB: [birth year], 79 y.o.   MRN: [id number]  HPI Michelle Vazquez is an 79 year old female with a past medical history of parathyroid adenoma status post left parathyroidectomy, mitral valve prolapse, PVCs, osteoarthritis, atrial fibrillation, hyperlipidemia, and esophageal dysphagia who is seen in follow-up. She came for upper endoscopy with dilation performed on 09/01/2013. Endoscopy showed moderate resistance to passage of the upper endoscope across the UES. The esophageal mucosa was normal. There was a nonobstructing Schatzki's ring at the GE junction, 38 cm from the incisors. Dilation was performed over a guidewire with savory dilator to 15 mm.  The stomach and examined duodenum were normal.  She reports with the dilation mild improvement in her swallowing but only transiently. This benefit seemed to wane quickly. Her swelling symptoms are no worse. She is taking small bites, chewing her food well. At this point soft foods seem to be worse. Occasionally even after she is able to get the food into her esophagus she feels like transited her stomach is slow. No abdominal pain. No nausea or vomiting. No heartburn.  She did fall on Wednesday of this week on the stairs and broke her left ankle and had a laceration to her skull. This required stapling. She went back for a head CT which was reportedly negative. She denies headache today. Denies dizziness. No change in vision  Review of Systems As per history of present illness, otherwise negative  Current Medications, Allergies, Past Medical History, Past Surgical History, Family History and Social History were reviewed in [employer] record.     Objective:   Physical Exam BP 108/68  Pulse 78  Ht 5' 1.5" (1.562 m)  Wt 106 lb (48.081 kg)  BMI 19.71 kg/m2 Constitutional: Elderly appearing female in no acute distress HEENT: Normocephalic and left mid scalp laceration with  approximately 8 Staples in place. It is clean dry and intact Cardiovascular: Normal rate, regular rhythm and intact distal pulses.  Pulmonary/chest: Effort normal and breath sounds normal. No wheezing, rales or rhonchi. Abdominal: Soft, nontender, nondistended. Bowel sounds active throughout.  Extremities: no clubbing, cyanosis, or edema. Boot on left lower extremity Neurological: Alert and oriented to person place and time. Psychiatric: Normal mood and affect. Behavior is normal.     Assessment & Plan:  79 year old female with a past medical history of parathyroid adenoma status post left parathyroidectomy, mitral valve prolapse, PVCs, osteoarthritis, atrial fibrillation, hyperlipidemia, and esophageal dysphagia who is seen in follow-up.  1.  Dysphagia/esophageal dysmotility -- unfortunately she did not benefit for very long from the esophageal dilation. I do think she likely has some hypertonicity to the upper esophageal sphincter, but also likely esophageal dysmotility. Per sagittal dysmotility is felt most likely to be presbyesophagus. We discussed repeat dilation with the goal of dilating to a slightly larger size, but at this point she is not interested in pursuing this. She feels like she is able to be enough to maintain her weight and she is satisfied with this. I would like her to try some peppermint oil to try to help relax her esophageal smooth muscle. We discussed possibly a calcium channel blocker, but I do not want to lower her blood pressure significantly, which could raise the risk of another fall.  She will try peppermint altoid before meals.  I have also advised that she try meal replacement such as boost or Ensure to help supplement her caloric intake. We left it that she  would call me if her dysphagia worsens or should she change her mind about repeat dilation. She is happy with this plan

## 2013-10-01 ENCOUNTER — Ambulatory Visit (INDEPENDENT_AMBULATORY_CARE_PROVIDER_SITE_OTHER): Payer: Medicare Other | Admitting: Internal Medicine

## 2013-10-01 ENCOUNTER — Encounter: Payer: Self-pay | Admitting: Internal Medicine

## 2013-10-01 ENCOUNTER — Ambulatory Visit (INDEPENDENT_AMBULATORY_CARE_PROVIDER_SITE_OTHER)
Admission: RE | Admit: 2013-10-01 | Discharge: 2013-10-01 | Disposition: A | Discharge status: Home / Self Care | Payer: Medicare Other | Source: Ambulatory Visit | Attending: Internal Medicine | Admitting: Internal Medicine

## 2013-10-01 VITALS — BP 110/60 | HR 57 | Temp 98.1°F | Ht 61.5 in | Wt 108.0 lb

## 2013-10-01 DIAGNOSIS — M25472 Effusion, left ankle: Secondary | ICD-10-CM

## 2013-10-01 DIAGNOSIS — M25473 Effusion, unspecified ankle: Secondary | ICD-10-CM | POA: Diagnosis not present

## 2013-10-01 DIAGNOSIS — S0101XS Laceration without foreign body of scalp, sequela: Secondary | ICD-10-CM

## 2013-10-01 DIAGNOSIS — M7989 Other specified soft tissue disorders: Secondary | ICD-10-CM

## 2013-10-01 DIAGNOSIS — M25579 Pain in unspecified ankle and joints of unspecified foot: Secondary | ICD-10-CM | POA: Diagnosis not present

## 2013-10-01 DIAGNOSIS — S8990XA Unspecified injury of unspecified lower leg, initial encounter: Secondary | ICD-10-CM | POA: Diagnosis not present

## 2013-10-01 DIAGNOSIS — S9030XA Contusion of unspecified foot, initial encounter: Secondary | ICD-10-CM | POA: Diagnosis not present

## 2013-10-01 DIAGNOSIS — IMO0002 Reserved for concepts with insufficient information to code with codable children: Secondary | ICD-10-CM

## 2013-10-01 NOTE — Patient Instructions (Addendum)
It was good to see you today.  We removed your staples from the scalp today - Wash gently in shower when washing hair and call if any increase in pain, any tenderness or drainage  Recheck xrays as ordered today. Your results will be released to MyChart (or called to you) after review, usually within 72hours after test completion. If any changes need to be made, you will be notified at that same time.  follow up as scheduled - call sooner if problems  Toe Fracture A toe fracture is a break in the bone of a toe. It may take 6 to 8 weeks for the toe injury to heal. HOME CARE  "Buddy taping" is taping the broken toe to the toe next to it. Leave the toes taped together for at least 1 week or as told by your doctor. Change the tape after bathing. Always use a small piece of gauze or cotton between the toes when taping them together.  Put ice on the injured area.  Put ice in a plastic bag.  Place a towel between your skin and the bag.  Leave the ice on for 15-20 minutes, 03-04 times a day.  After the first 2 days, put heat on the injured area. Use heat for the next 2 to 3 days. Put a heating pad on the foot or soak the foot in warm water as told by your doctor.  Keep the foot raised (elevated) above the level of your heart.  Wear sturdy, supportive shoes. The shoes should not pinch the toes or fit tightly against the toes.  Use a cast shoe (if prescribed) if the foot is very puffy (swollen).  Use crutches if you have pain or it hurts too much to walk.  Only take medicine as told by your doctor.  Follow up with your doctor as told. GET HELP RIGHT AWAY IF:   There is pain or puffiness that is not helped by medicine.  The pain does not get better after 1 week.  The toe is cold when the others are warm.  The toe loses feeling (numb) or turns white.  The toe becomes hot and red (inflamed). MAKE SURE YOU:   Understand these instructions.  Will watch this condition.  Will get  help right away if you are not doing well or get worse. Document Released: 04/10/2008 Document Revised: 01/15/2012 Document Reviewed: 03/18/2010 [facility] Patient Information 2014 [place], [place]. Laceration Care, Adult A laceration is a cut or lesion that goes through all layers of the skin and into the tissue just beneath the skin. TREATMENT  Some lacerations may not require closure. Some lacerations may not be able to be closed due to an increased risk of infection. It is important to see your caregiver as soon as possible after an injury to minimize the risk of infection and maximize the opportunity for successful closure. If closure is appropriate, pain medicines may be given, if needed. The wound will be cleaned to help prevent infection. Your caregiver will use stitches (sutures), staples, wound glue (adhesive), or skin adhesive strips to repair the laceration. These tools bring the skin edges together to allow for faster healing and a better cosmetic outcome. However, all wounds will heal with a scar. Once the wound has healed, scarring can be minimized by covering the wound with sunscreen during the day for 1 full year. HOME CARE INSTRUCTIONS  For sutures or staples:  Keep the wound clean and dry.  If you were given a bandage (  dressing), you should change it at least once a day. Also, change the dressing if it becomes wet or dirty, or as directed by your caregiver.  Wash the wound with soap and water 2 times a day. Rinse the wound off with water to remove all soap. Pat the wound dry with a clean towel.  After cleaning, apply a thin layer of the antibiotic ointment as recommended by your caregiver. This will help prevent infection and keep the dressing from sticking.  You may shower as usual after the first 24 hours. Do not soak the wound in water until the sutures are removed.  Only take over-the-counter or prescription medicines for pain, discomfort, or fever as directed by your  caregiver.  Get your sutures or staples removed as directed by your caregiver. For skin adhesive strips:  Keep the wound clean and dry.  Do not get the skin adhesive strips wet. You may bathe carefully, using caution to keep the wound dry.  If the wound gets wet, pat it dry with a clean towel.  Skin adhesive strips will fall off on their own. You may trim the strips as the wound heals. Do not remove skin adhesive strips that are still stuck to the wound. They will fall off in time. For wound adhesive:  You may briefly wet your wound in the shower or bath. Do not soak or scrub the wound. Do not swim. Avoid periods of heavy perspiration until the skin adhesive has fallen off on its own. After showering or bathing, gently pat the wound dry with a clean towel.  Do not apply liquid medicine, cream medicine, or ointment medicine to your wound while the skin adhesive is in place. This may loosen the film before your wound is healed.  If a dressing is placed over the wound, be careful not to apply tape directly over the skin adhesive. This may cause the adhesive to be pulled off before the wound is healed.  Avoid prolonged exposure to sunlight or tanning lamps while the skin adhesive is in place. Exposure to ultraviolet light in the first year will darken the scar.  The skin adhesive will usually remain in place for 5 to 10 days, then naturally fall off the skin. Do not pick at the adhesive film. You may need a tetanus shot if:  You cannot remember when you had your last tetanus shot.  You have never had a tetanus shot. If you get a tetanus shot, your arm may swell, get red, and feel warm to the touch. This is common and not a problem. If you need a tetanus shot and you choose not to have one, there is a rare chance of getting tetanus. Sickness from tetanus can be serious. SEEK MEDICAL CARE IF:   You have redness, swelling, or increasing pain in the wound.  You see a red line that goes away  from the wound.  You have yellowish-white fluid (pus) coming from the wound.  You have a fever.  You notice a bad smell coming from the wound or dressing.  Your wound breaks open before or after sutures have been removed.  You notice something coming out of the wound such as wood or glass.  Your wound is on your hand or foot and you cannot move a finger or toe. SEEK IMMEDIATE MEDICAL CARE IF:   Your pain is not controlled with prescribed medicine.  You have severe swelling around the wound causing pain and numbness or a change in color  in your arm, hand, leg, or foot.  Your wound splits open and starts bleeding.  You have worsening numbness, weakness, or loss of function of any joint around or beyond the wound.  You develop painful lumps near the wound or on the skin anywhere on your body. MAKE SURE YOU:   Understand these instructions.  Will watch your condition.  Will get help right away if you are not doing well or get worse. Document Released: 10/23/2005 Document Revised: 01/15/2012 Document Reviewed: 04/18/2011 [facility] Patient Information 2014 [place], [place].

## 2013-10-01 NOTE — Progress Notes (Signed)
Pre visit review using our clinic review tool, if applicable. No additional management support is needed unless otherwise documented below in the visit note. 

## 2013-10-01 NOTE — Progress Notes (Signed)
  Subjective:    Patient ID: Michelle Vazquez, female    DOB: November 10, 1928, 79 y.o.   MRN: [id number]  Suture / Staple Removal   Also reviewed chronic medical issues and interval medical events  Past Medical History  Diagnosis Date  . Mitral valve prolapse   . Parathyroid adenoma     post parathyroid surgery in October of 2008  . Premature ventricular contractions (PVCs) (VPCs)   . Paroxysmal supraventricular tachycardia   . Arthritis   . Urinary incontinence   . Atrial fibrillation   . HLD (hyperlipidemia)   . Bowel obstruction 2000  . Squamous cell carcinoma in situ of skin     face  . Cancer     facial squamous cell skin ca  . Cataract     bilateral removed  . Heart murmur   . Osteoporosis   . Thyroid disease     parathyroid adenoma  . Schatzki's ring 2014    Dr. Rhea Belton     Review of Systems  Constitutional: Positive for fatigue. Negative for fever, chills and activity change.  HENT: Positive for trouble swallowing.   Respiratory: Negative for cough, chest tightness and shortness of breath.   Cardiovascular: Negative for chest pain and palpitations.  Gastrointestinal: Negative for nausea, vomiting, diarrhea and constipation.  Musculoskeletal: Negative for neck pain and neck stiffness.  Neurological: Negative for dizziness, syncope and light-headedness.       Objective:   Physical Exam  Constitutional: She is oriented to person, place, and time. She appears well-developed and well-nourished. No distress.  Cardiovascular: Normal rate, regular rhythm and normal heart sounds.   No murmur heard. Pulmonary/Chest: Effort normal and breath sounds normal. No respiratory distress.  Musculoskeletal:  Left ankle with soft tissue swelling laterally but full range of motion. Tender to palpation anteriorly. Also tender to palpation with passive range of motion of second and third toes  Neurological: She is alert and oriented to person, place, and time. She has normal reflexes. No  cranial nerve deficit.  Skin:  1 inch laceration left temporal region of scalp, well approximated without evidence for infection or problem. 6 staples removed today without complication.  Left ankle with soft tissue swelling and bruising laterally, left second and third toes with soft tissue swelling and bruising including plantar surface beneath the metatarsal head     Wt Readings from Last 3 Encounters:  10/01/13 108 lb (48.988 kg)  09/26/13 106 lb (48.081 kg)  09/01/13 104 lb (47.174 kg)   BP Readings from Last 3 Encounters:  10/01/13 110/60  09/26/13 108/68  09/25/13 118/56       Assessment & Plan:   Accidental fall 11/19 - no syncope -  Scalp laceration -left temporal region. 6 Staples removed today without problems   L foot injury: 2nd and 3rd toe with bruising and L lateral ankle with soft tissue swelling and bruise - suspected fractures  Reviewed x-ray left ankle 11/19 during ER visit  Pain controlled with use of CAM walker as ongoing Recheck xray today to monitor fx healing - ortho/sport med follow up if needed

## 2013-10-03 NOTE — ED Provider Notes (Signed)
CSN: [id number]     Arrival date & time 09/25/13  1336 History   First MD Initiated Contact with Patient 09/25/13 1355     Chief Complaint  Patient presents with  . Nausea    Had a head injury yesterday   (Consider location/radiation/quality/duration/timing/severity/associated sxs/prior Treatment) HPI Comments: 79 yo female with MVP, a fib hx presents with with nausea and difficulty sleeping since head injury with brief lox yesterday.  Mechanical fall, tripped, no sxs prior to.  Pt has lac repair in ED yesterday.  No vomiting. No further loc or cp.  Nausea persistent. Nothing improves.   The history is provided by the patient.    Past Medical History  Diagnosis Date  . Mitral valve prolapse   . Parathyroid adenoma     post parathyroid surgery in October of 2008  . Premature ventricular contractions (PVCs) (VPCs)   . Paroxysmal supraventricular tachycardia   . Arthritis   . Urinary incontinence   . Atrial fibrillation   . HLD (hyperlipidemia)   . Bowel obstruction 2000  . Squamous cell carcinoma in situ of skin     face  . Cancer     facial squamous cell skin ca  . Cataract     bilateral removed  . Heart murmur   . Osteoporosis   . Thyroid disease     parathyroid adenoma  . Schatzki's ring 2014    Dr. Rhea Belton   Past Surgical History  Procedure Laterality Date  . Shoulder surgery  02/1999    right  . Skin lesion excision  08/1999    left side of face  . Laparoscopic small bowel resection  2000  . Breast biopsy  1979    left  . Abdominal hysterectomy  1989  . Parathyroid surgery  2008?    left  . Cataract extraction      bilateral   Family History  Problem Relation Age of Onset  . Arthritis Father   . Heart disease Father   . Hypertension Father   . Stroke Father   . Heart attack Father   . Arthritis Mother   . Stroke Mother   . Stroke Brother   . Diabetes Brother   . Hypertension Brother   . Colon cancer Sister   . Hyperlipidemia Sister   .  Hypertension Sister   . Hypertension Sister   . Esophageal cancer Neg Hx   . Rectal cancer Neg Hx   . Stomach cancer Neg Hx    History  Substance Use Topics  . Smoking status: Never Smoker   . 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 Alcohol Use: No   OB History   Grav Para Term Preterm Abortions TAB SAB Ect Mult Living                 Review of Systems  Constitutional: Negative for fever and chills.  HENT: Negative for congestion.   Eyes: Negative for visual disturbance.  Respiratory: Negative for shortness of breath.   Cardiovascular: Negative for chest pain.  Gastrointestinal: Positive for nausea. Negative for vomiting.  Musculoskeletal: Negative for neck pain and neck stiffness.  Skin: Negative for rash.  Neurological: Positive for headaches. Negative for light-headedness.    Allergies  Inderal; Lopressor; and Penicillins  Home Medications   Current Outpatient Rx  Name  Route  Sig  Dispense  Refill  . bisoprolol (ZEBETA) 5 MG tablet   Oral  Take 2.5 mg by mouth 3 (three) times a week. Monday, Wednesday and Friday         . calcium gluconate 500 MG tablet   Oral   Take 500 mg by mouth daily.           . flecainide (TAMBOCOR) 50 MG tablet   Oral   Take 1 tablet (50 mg total) by mouth 2 (two) times daily.   180 tablet   3   . Multiple Vitamin (MULTIVITAMIN WITH MINERALS) TABS tablet   Oral   Take 1 tablet by mouth daily.         . ondansetron (ZOFRAN ODT) 4 MG disintegrating tablet      4mg  ODT q4 hours prn nausea/vomit   8 tablet   0    BP 118/56  Pulse 70  Temp(Src) 98.2 F (36.8 C) (Oral)  Resp 16  SpO2 95% Physical Exam  Nursing note and vitals reviewed. Constitutional: She is oriented to person, place, and time. She appears well-developed and well-nourished.  HENT:  Head: Normocephalic and atraumatic.  Eyes: Conjunctivae are normal.  Neck: Normal range of motion. Neck supple. No  tracheal deviation present.  Cardiovascular: Normal rate and regular rhythm.   Pulmonary/Chest: Effort normal.  Abdominal: Soft. There is no tenderness. There is no guarding.  Musculoskeletal: She exhibits tenderness (mild to scalp lac previously repaired, no surrounding erythema/ drainage or warmth). She exhibits no edema.  Neurological: She is alert and oriented to person, place, and time. GCS eye subscore is 4. GCS verbal subscore is 5. GCS motor subscore is 6.  5+ strength in UE and LE with f/e at major joints. Sensation to palpation intact in UE and LE. CNs 2-12 grossly intact.  EOMFI.  PERRL.   Finger nose and coordination intact bilateral.   Visual fields intact to finger testing.   Skin: Skin is warm.  Psychiatric: She has a normal mood and affect.    ED Course  Procedures (including critical care time) Labs Review Labs Reviewed - No data to display Imaging Review Dg Ankle Complete Left  10/01/2013   CLINICAL DATA:  Pain status post fall 1 week ago.  EXAM: LEFT ANKLE COMPLETE - 3+ VIEW  COMPARISON:  September 24, 2013.  FINDINGS: Again demonstrated is the small bony density adjacent to the distal aspect of the dorsum of the talus. There may be a small amount of overlying soft tissue swelling but this is unchanged from the earlier study. The ankle joint mortise is mildly narrowed. Medially but this is not unexpected given the patient's age. There is no evidence of an acute malleolar fracture. The metadiaphyses of the distal tibia and fibula appear intact. The calcaneus and other tarsal bones appear normal and the metatarsal bases are intact where visualized.  IMPRESSION: 1. The tiny bony density adjacent to the dorsum of the distal aspect of the talus is unchanged and may reflect an acute or old avulsion. 2. Mild narrowing of the ankle joint mortise is consistent with osteoarthritis appropriate for age. 3. There is no evidence of an acute fracture otherwise.   Electronically Signed   By:  David  [place]   On: 10/01/2013 15:49   Dg Foot 2 Views Left  10/01/2013   CLINICAL DATA:  Left foot pain, toe swelling and bruising, fall 09/24/2013  EXAM: LEFT FOOT - 2 VIEW  COMPARISON:  None  FINDINGS: Diffuse osseous demineralization.  Joint spaces preserved.  Thin linear calcific density identified dorsal to the distal talus,  cannot exclude a small capsular avulsion fracture.  No additional fracture, dislocation or bone destruction.  IMPRESSION: Thin linear bone density adjacent to the dorsal margin of the distal talus, cannot exclude small capsular avulsion fracture; recommend correlation for pain/tenderness at this site.   Electronically Signed   By: Ulyses Southward M.D.   On: 10/01/2013 15:50  Dg Ankle Complete Left  10/01/2013   CLINICAL DATA:  Pain status post fall 1 week ago.  EXAM: LEFT ANKLE COMPLETE - 3+ VIEW  COMPARISON:  September 24, 2013.  FINDINGS: Again demonstrated is the small bony density adjacent to the distal aspect of the dorsum of the talus. There may be a small amount of overlying soft tissue swelling but this is unchanged from the earlier study. The ankle joint mortise is mildly narrowed. Medially but this is not unexpected given the patient's age. There is no evidence of an acute malleolar fracture. The metadiaphyses of the distal tibia and fibula appear intact. The calcaneus and other tarsal bones appear normal and the metatarsal bases are intact where visualized.  IMPRESSION: 1. The tiny bony density adjacent to the dorsum of the distal aspect of the talus is unchanged and may reflect an acute or old avulsion. 2. Mild narrowing of the ankle joint mortise is consistent with osteoarthritis appropriate for age. 3. There is no evidence of an acute fracture otherwise.   Electronically Signed   By: David  [place]   On: 10/01/2013 15:49   Dg Ankle Complete Left  09/24/2013   CLINICAL DATA:  Fall.  Pain.  EXAM: LEFT ANKLE COMPLETE - 3+ VIEW  COMPARISON:  None.  FINDINGS: Mild soft tissue  swelling is noted along the anterior ankle. Small bony density is noted along the dorsal aspect of the distal talus on lateral view. Tiny avulsion fracture arising from the distal talus could present in this fashion. This may represent an old injury. No other focal abnormalities are identified. Ankle is otherwise intact.  IMPRESSION: Small duct bony density noted along the dorsal aspect of the distal talus on lateral view. This could represent a tiny fracture. No other focal bony abnormalities identified. Malleoli are intact.   Electronically Signed   By: Maisie Fus  Register   On: 09/24/2013 12:42   Ct Head Wo Contrast  09/25/2013   CLINICAL DATA:  Fall.  Nausea.  EXAM: CT HEAD WITHOUT CONTRAST  TECHNIQUE: Contiguous axial images were obtained from the base of the skull through the vertex without intravenous contrast.  COMPARISON:  None.  FINDINGS: Scalp staples left posterior frontal -parietal region. No skull fracture or intracranial hemorrhage.  No hydrocephalus.  No CT evidence of large acute infarct.  No intracranial mass lesion noted on this unenhanced exam.  Paranasal sinuses which are visualized, mastoid air cells and middle ear cavities are clear.  IMPRESSION: No acute abnormality.  Please see above.   Electronically Signed   By: Bridgett Larsson M.D.   On: 09/25/2013 15:14   Dg Foot 2 Views Left  10/01/2013   CLINICAL DATA:  Left foot pain, toe swelling and bruising, fall 09/24/2013  EXAM: LEFT FOOT - 2 VIEW  COMPARISON:  None  FINDINGS: Diffuse osseous demineralization.  Joint spaces preserved.  Thin linear calcific density identified dorsal to the distal talus, cannot exclude a small capsular avulsion fracture.  No additional fracture, dislocation or bone destruction.  IMPRESSION: Thin linear bone density adjacent to the dorsal margin of the distal talus, cannot exclude small capsular avulsion fracture; recommend correlation for pain/tenderness  at this site.   Electronically Signed   By: Ulyses Southward  M.D.   On: 10/01/2013 15:50   EKG Interpretation   None       MDM   1. Head injury, subsequent encounter   2. Concussion, without loss of consciousness, initial encounter   3. Nausea    With age, brief loc and nausea CT done. CT no acute findings.  Clinically concussion.  Discussed outpt fup.  Normal neuro in ed. Results and differential diagnosis were discussed with the patient. Close follow up outpatient was discussed, patient comfortable with the plan.   Diagnosis: Head injury, concussion     Enid Skeens, MD 10/03/13 1512

## 2013-10-04 DIAGNOSIS — S41109A Unspecified open wound of unspecified upper arm, initial encounter: Secondary | ICD-10-CM | POA: Diagnosis not present

## 2013-10-06 DIAGNOSIS — W540XXA Bitten by dog, initial encounter: Secondary | ICD-10-CM | POA: Diagnosis not present

## 2013-10-06 DIAGNOSIS — T148XXA Other injury of unspecified body region, initial encounter: Secondary | ICD-10-CM | POA: Diagnosis not present

## 2013-10-16 ENCOUNTER — Ambulatory Visit (INDEPENDENT_AMBULATORY_CARE_PROVIDER_SITE_OTHER): Payer: Medicare Other | Admitting: Physician Assistant

## 2013-10-16 DIAGNOSIS — I4949 Other premature depolarization: Secondary | ICD-10-CM | POA: Diagnosis not present

## 2013-10-16 DIAGNOSIS — I493 Ventricular premature depolarization: Secondary | ICD-10-CM

## 2013-10-16 NOTE — Progress Notes (Signed)
Exercise Treadmill Test  Pre-Exercise Testing Evaluation Rhythm: normal sinus  Rate: 70 bpm     Test  Exercise Tolerance Test Ordering MD: Lewayne Bunting, MD  Interpreting MD: Tereso Newcomer, PA-C  Unique Test No: 1  Treadmill:  1  Indication for ETT: PVC  Contraindication to ETT: No   Stress Modality: exercise - treadmill  Cardiac Imaging Performed: non   Protocol: standard Bruce - maximal  Max BP:  175/61  Max MPHR (bpm):  139 85% MPR (bpm):  118  MPHR obtained (bpm):  130 % MPHR obtained:  93  Reached 85% MPHR (min:sec):  2:47 Total Exercise Time (min-sec):  6:00  Workload in METS:  7.0 Borg Scale: 16  Reason ETT Terminated:  patient's desire to stop    ST Segment Analysis At Rest: normal ST segments - no evidence of significant ST depression With Exercise: non-specific ST changes  Other Information Arrhythmia:  No Angina during ETT:  absent (0) Quality of ETT:  diagnostic  ETT Interpretation:  normal - no evidence of ischemia by ST analysis  Comments: Good exercise capacity. No chest pain. Normal BP response to exercise. No ST changes to suggest ischemia.  No exercise induced ventricular arrhythmias. Frequent PACs in recovery.  Recommendations: It is ok to continue Flecainide. F/u with Dr. Lewayne Bunting as planned.  Signed,  Tereso Newcomer, PA-C   10/16/2013 12:12 PM

## 2013-11-05 ENCOUNTER — Other Ambulatory Visit: Payer: Self-pay | Admitting: Cardiology

## 2013-11-11 ENCOUNTER — Telehealth: Payer: Self-pay | Admitting: Internal Medicine

## 2013-11-11 NOTE — Telephone Encounter (Addendum)
Spoke with the patient and let her know she will need to follow up with Dr Jannet Askew in May and she is to take the Bisoprolol as she has been  No metoprolol

## 2013-11-11 NOTE — Telephone Encounter (Signed)
New message  Pt called to discuss the medication metoprolol.  She requests a call back to discuss why she has to take it.. Please call

## 2013-11-13 ENCOUNTER — Ambulatory Visit: Payer: Medicare Other | Admitting: Internal Medicine

## 2014-02-27 ENCOUNTER — Telehealth: Payer: Self-pay | Admitting: Internal Medicine

## 2014-02-27 NOTE — Telephone Encounter (Signed)
New Message:  Pt is wanting to clarify when she needs to f/u w/ Lovena Le next. Dr, Lovena Le did not state in his Oct 2014 OV note... There is no recall... Pt scheduled an appt for 5/1... However she wants to know if she should keep it or wait til later this year. Pt wants to know what Dr. Lovena Le recommends.

## 2014-02-27 NOTE — Telephone Encounter (Signed)
Pt is aware that she is to F/U with Dr. Lovena Le on May 1 st as scheduled. Pt verbalized understanding.

## 2014-03-06 ENCOUNTER — Encounter: Payer: Self-pay | Admitting: Internal Medicine

## 2014-03-06 ENCOUNTER — Ambulatory Visit (INDEPENDENT_AMBULATORY_CARE_PROVIDER_SITE_OTHER): Payer: Medicare Other | Admitting: Internal Medicine

## 2014-03-06 VITALS — BP 137/73 | HR 72 | Ht 61.5 in | Wt 108.0 lb

## 2014-03-06 DIAGNOSIS — I4949 Other premature depolarization: Secondary | ICD-10-CM | POA: Diagnosis not present

## 2014-03-06 DIAGNOSIS — I493 Ventricular premature depolarization: Secondary | ICD-10-CM

## 2014-03-06 NOTE — Patient Instructions (Signed)
Your physician wants you to follow-up in:  12 months.  You will receive a reminder letter in the mail two months in advance. If you don't receive a letter, please call our office to schedule the follow-up appointment.   

## 2014-03-06 NOTE — Progress Notes (Signed)
HPI Michelle Vazquez returns today for followup of very dense ventricular ectopy. The patient is an 80 year old woman whose health has otherwise been good. She has been bothered by palpitations and feelings of fatigue and weakness for the last several months. She had normal left ventricular systolic function. She wore a 48 hour Holter monitor which demonstrated 11,000-12,000 PVCs in a 24-hour period. She was placed on Flecainide and her symptoms have improved markedly. She has had essentially no additional symptomatic PVC's. Allergies  Allergen Reactions  . Inderal [Propranolol Hcl]     Pt does not remember reaction  . Lopressor [Metoprolol Tartrate]     Per the pt' "I felt like I was going to pass out"  . Penicillins Diarrhea     Current Outpatient Prescriptions  Medication Sig Dispense Refill  . bisoprolol (ZEBETA) 5 MG tablet Take 2.5 mg by mouth 3 (three) times a week. Monday, Wednesday and Friday      . calcium gluconate 500 MG tablet Take 500 mg by mouth daily.        . flecainide (TAMBOCOR) 50 MG tablet Take 1 tablet (50 mg total) by mouth 2 (two) times daily.  180 tablet  3  . Multiple Vitamin (MULTIVITAMIN WITH MINERALS) TABS tablet Take 1 tablet by mouth daily.       No current facility-administered medications for this visit.     Past Medical History  Diagnosis Date  . Mitral valve prolapse   . Parathyroid adenoma     post parathyroid surgery in October of 2008  . Premature ventricular contractions (PVCs) (VPCs)   . Paroxysmal supraventricular tachycardia   . Arthritis   . Urinary incontinence   . Atrial fibrillation   . HLD (hyperlipidemia)   . Bowel obstruction 2000  . Squamous cell carcinoma in situ of skin     face  . Cancer     facial squamous cell skin ca  . Cataract     bilateral removed  . Heart murmur   . Osteoporosis   . Thyroid disease     parathyroid adenoma  . Schatzki's ring 2014    Dr. Hilarie Fredrickson    ROS:   All systems reviewed and negative  except as noted in the HPI.   Past Surgical History  Procedure Laterality Date  . Shoulder surgery  02/1999    right  . Skin lesion excision  08/1999    left side of face  . Laparoscopic small bowel resection  2000  . Breast biopsy  1979    left  . Abdominal hysterectomy  1989  . Parathyroid surgery  2008?    left  . Cataract extraction      bilateral     Family History  Problem Relation Age of Onset  . Arthritis Father   . Heart disease Father   . Hypertension Father   . Stroke Father   . Heart attack Father   . Arthritis Mother   . Stroke Mother   . Stroke Brother   . Diabetes Brother   . Hypertension Brother   . Colon cancer Sister   . Hyperlipidemia Sister   . Hypertension Sister   . Hypertension Sister   . Esophageal cancer Neg Hx   . Rectal cancer Neg Hx   . Stomach cancer Neg Hx      History   Social History  . Marital Status: Married    Spouse Name: N/A    Number of Children: 4  .  Years of Education: 12+   Occupational History  . Retired     Research officer, political party    Social History Main Topics  . Smoking status: Never Smoker   . 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 Alcohol Use: No  . Drug Use: No  . Sexual Activity: Not on file   Other Topics Concern  . Not on file   Social History Narrative   Regular exercise-sometimes   Caffeine Use-no     BP 137/73  Pulse 72  Ht 5' 1.5" (1.562 m)  Wt 108 lb (48.988 kg)  BMI 20.08 kg/m2  Physical Exam:  Well appearing elderly woman, NAD HEENT: Unremarkable Neck:  6 cm JVD, no thyromegally Back:  No CVA tenderness Lungs:  Clear with no wheezes, rales, or rhonchi. HEART:  IRegular rate rhythm, no murmurs, no rubs, no clicks Abd:  soft, positive bowel sounds, no organomegally, no rebound, no guarding Ext:  2 plus pulses, no edema, no cyanosis, no clubbing Skin:  No rashes no nodules Neuro:  CN II through XII intact, motor grossly intact  EKG -  normal sinus rhythm with no PVC's.  Assess/Plan:

## 2014-03-06 NOTE — Assessment & Plan Note (Signed)
Her symptoms have resolved on low dose flecainide and she feels better. She is back to gardening. I will see her back in 6-12 months.

## 2014-05-27 ENCOUNTER — Encounter: Payer: Self-pay | Admitting: Cardiology

## 2014-07-06 ENCOUNTER — Other Ambulatory Visit: Payer: Self-pay

## 2014-07-06 DIAGNOSIS — Z1231 Encounter for screening mammogram for malignant neoplasm of breast: Secondary | ICD-10-CM

## 2014-07-15 DIAGNOSIS — H43399 Other vitreous opacities, unspecified eye: Secondary | ICD-10-CM | POA: Diagnosis not present

## 2014-07-15 DIAGNOSIS — H26499 Other secondary cataract, unspecified eye: Secondary | ICD-10-CM | POA: Diagnosis not present

## 2014-07-15 DIAGNOSIS — H18519 Endothelial corneal dystrophy, unspecified eye: Secondary | ICD-10-CM | POA: Diagnosis not present

## 2014-07-15 DIAGNOSIS — H02059 Trichiasis without entropian unspecified eye, unspecified eyelid: Secondary | ICD-10-CM | POA: Diagnosis not present

## 2014-07-16 ENCOUNTER — Ambulatory Visit
Admission: RE | Admit: 2014-07-16 | Discharge: 2014-07-16 | Disposition: A | Discharge status: Home / Self Care | Payer: Medicare Other | Source: Ambulatory Visit

## 2014-07-16 DIAGNOSIS — Z1231 Encounter for screening mammogram for malignant neoplasm of breast: Secondary | ICD-10-CM | POA: Diagnosis not present

## 2014-08-04 ENCOUNTER — Encounter: Payer: Self-pay | Admitting: Internal Medicine

## 2014-08-04 ENCOUNTER — Ambulatory Visit (INDEPENDENT_AMBULATORY_CARE_PROVIDER_SITE_OTHER): Payer: Medicare Other | Admitting: Internal Medicine

## 2014-08-04 ENCOUNTER — Other Ambulatory Visit (INDEPENDENT_AMBULATORY_CARE_PROVIDER_SITE_OTHER): Payer: Medicare Other

## 2014-08-04 VITALS — BP 122/78 | HR 68 | Temp 98.0°F | Ht 61.5 in | Wt 107.8 lb

## 2014-08-04 DIAGNOSIS — Z0000 Encounter for general adult medical examination without abnormal findings: Secondary | ICD-10-CM

## 2014-08-04 DIAGNOSIS — I4949 Other premature depolarization: Secondary | ICD-10-CM

## 2014-08-04 DIAGNOSIS — E785 Hyperlipidemia, unspecified: Secondary | ICD-10-CM

## 2014-08-04 DIAGNOSIS — I493 Ventricular premature depolarization: Secondary | ICD-10-CM

## 2014-08-04 DIAGNOSIS — R1310 Dysphagia, unspecified: Secondary | ICD-10-CM

## 2014-08-04 DIAGNOSIS — Z23 Encounter for immunization: Secondary | ICD-10-CM

## 2014-08-04 LAB — BASIC METABOLIC PANEL
BUN: 18 mg/dL (ref 6–23)
CHLORIDE: 105 meq/L (ref 96–112)
CO2: 31 meq/L (ref 19–32)
Calcium: 9.4 mg/dL (ref 8.4–10.5)
Creatinine, Ser: 0.8 mg/dL (ref 0.4–1.2)
GFR: 68.86 mL/min (ref >60.00)
GLUCOSE: 96 mg/dL (ref 70–99)
POTASSIUM: 5.3 meq/L — AB (ref 3.5–5.1)
Sodium: 140 mEq/L (ref 135–145)

## 2014-08-04 LAB — HEPATIC FUNCTION PANEL
ALBUMIN: 4.1 g/dL (ref 3.5–5.2)
ALT: 11 U/L (ref 0–35)
AST: 22 U/L (ref 0–37)
Alkaline Phosphatase: 49 U/L (ref 39–117)
BILIRUBIN TOTAL: 0.8 mg/dL (ref 0.2–1.2)
Bilirubin, Direct: 0.1 mg/dL (ref 0.0–0.3)
Total Protein: 7.2 g/dL (ref 6.0–8.3)

## 2014-08-04 LAB — CBC WITH DIFFERENTIAL/PLATELET
BASOS ABS: 0 10*3/uL (ref 0.0–0.1)
Basophils Relative: 0.5 % (ref 0.0–3.0)
Eosinophils Absolute: 0.1 10*3/uL (ref 0.0–0.7)
Eosinophils Relative: 1.7 % (ref 0.0–5.0)
HEMATOCRIT: 40.3 % (ref 36.0–46.0)
Hemoglobin: 13.5 g/dL (ref 12.0–15.0)
LYMPHS ABS: 2.3 10*3/uL (ref 0.7–4.0)
Lymphocytes Relative: 41.5 % (ref 12.0–46.0)
MCHC: 33.5 g/dL (ref 30.0–36.0)
MCV: 93.4 fl (ref 78.0–100.0)
MONO ABS: 0.5 10*3/uL (ref 0.1–1.0)
Monocytes Relative: 8.6 % (ref 3.0–12.0)
Neutro Abs: 2.6 10*3/uL (ref 1.4–7.7)
Neutrophils Relative %: 47.7 % (ref 43.0–77.0)
Platelets: 279 10*3/uL (ref 150.0–400.0)
RBC: 4.32 Mil/uL (ref 3.87–5.11)
RDW: 13.4 % (ref 11.5–15.5)
WBC: 5.5 10*3/uL (ref 4.0–10.5)

## 2014-08-04 LAB — LIPID PANEL
CHOLESTEROL: 241 mg/dL — AB (ref 0–200)
HDL: 76.3 mg/dL (ref >39.00)
LDL CALC: 151 mg/dL — AB (ref 0–99)
NonHDL: 164.7
Total CHOL/HDL Ratio: 3
Triglycerides: 71 mg/dL (ref 0.0–149.0)
VLDL: 14.2 mg/dL (ref 0.0–40.0)

## 2014-08-04 LAB — TSH: TSH: 1.24 u[IU]/mL (ref 0.35–4.50)

## 2014-08-04 MED ORDER — BISOPROLOL FUMARATE 5 MG PO TABS: 2.5000 mg | ORAL_TABLET | ORAL | Status: DC

## 2014-08-04 NOTE — Assessment & Plan Note (Signed)
Chronic and progressive symptoms  No significant weight loss or regurgitation but significant problem with solids and soft foods Also unable to swallow caps or larger caps - prefers chewable or disolved Reviewed barium swallow 06/2011 with retained pill - ?sinus or diverticular pocket EGD by Dr Hilarie Fredrickson in September 2013 unremarkable.  By report, follow up with ENT Wilburn Cornelia) who recommended GI re-eval due to "stricture at entrance to esophagus).  s/p re-refer to Dr Hilarie Fredrickson Fall 2014 for evaluation of upper esophagus as cause for swallowing dysfunction - unimproved with dilatation and no other options at this time

## 2014-08-04 NOTE — Assessment & Plan Note (Signed)
Never on statin Check lipids annually

## 2014-08-04 NOTE — Progress Notes (Signed)
Pre visit review using our clinic review tool, if applicable. No additional management support is needed unless otherwise documented below in the visit note. 

## 2014-08-04 NOTE — Progress Notes (Signed)
Subjective:    Patient ID: Michelle Vazquez, female    DOB: April 16, 1929, 80 y.o.   MRN: [id number]  HPI   Here for medicare wellness  Diet: heart healthy  Physical activity: sedentary Depression/mood screen: negative Hearing: intact to whispered voice Visual acuity: grossly normal, performs annual eye exam  ADLs: capable Fall risk: none Home safety: good Cognitive evaluation: intact to orientation, naming, recall and repetition EOL planning: adv directives, full code/ I agree  I have personally reviewed and have noted 1. The patient's medical and social history 2. Their use of alcohol, tobacco or illicit drugs 3. Their current medications and supplements 4. The patient's functional ability including ADL's, fall risks, home safety risks and hearing or visual impairment. 5. Diet and physical activities 6. Evidence for depression or mood disorders  Also reviewed chronic medical issues and interval medical events  Past Medical History  Diagnosis Date  . Mitral valve prolapse   . Parathyroid adenoma     post parathyroid surgery in October of 2008  . Premature ventricular contractions (PVCs) (VPCs)   . Paroxysmal supraventricular tachycardia   . Arthritis   . Urinary incontinence   . Atrial fibrillation   . HLD (hyperlipidemia)   . Bowel obstruction 2000  . Squamous cell carcinoma in situ of skin     face  . Cataract     bilateral removed  . Heart murmur   . Osteoporosis   . Schatzki's ring 2014    Dr. Hilarie Fredrickson   Family History  Problem Relation Age of Onset  . Arthritis Father   . Heart disease Father   . Hypertension Father   . Stroke Father   . Heart attack Father   . Arthritis Mother   . Stroke Mother   . Stroke Brother   . Diabetes Brother   . Hypertension Brother   . Colon cancer Sister   . Hyperlipidemia Sister   . Hypertension Sister   . Hypertension Sister   . Esophageal cancer Neg Hx   . Rectal cancer Neg Hx   . Stomach cancer Neg Hx    History    Substance Use Topics  . Smoking status: Never Smoker   . Smokeless tobacco: Never Used  . Alcohol Use: No    Review of Systems  Constitutional: Negative for fatigue and unexpected weight change.  Respiratory: Negative for cough, shortness of breath and wheezing.   Cardiovascular: Negative for chest pain, palpitations and leg swelling.  Gastrointestinal: Negative for nausea, abdominal pain and diarrhea.  Genitourinary: Positive for frequency (stress incont) and decreased urine volume.  Neurological: Negative for dizziness, weakness, light-headedness and headaches.  Psychiatric/Behavioral: Negative for dysphoric mood. The patient is not nervous/anxious.   All other systems reviewed and are negative.      Objective:   Physical Exam  BP 122/78  Pulse 68  Temp(Src) 98 F (36.7 C) (Oral)  Ht 5' 1.5" (1.562 m)  Wt 107 lb 12 oz (48.875 kg)  BMI 20.03 kg/m2  SpO2 96% Wt Readings from Last 3 Encounters:  08/04/14 107 lb 12 oz (48.875 kg)  03/06/14 108 lb (48.988 kg)  10/01/13 108 lb (48.988 kg)   Constitutional: She is thin, appears well-developed and well-nourished. No distress.  Neck: Normal range of motion. Neck supple. No JVD present. No thyromegaly present.  Cardiovascular: Normal rate, regular rhythm and normal heart sounds.  No murmur heard. No BLE edema. Pulmonary/Chest: Effort normal and breath sounds normal. No respiratory distress. She has no wheezes.  Psychiatric: She has a normal mood and affect. Her behavior is normal. Judgment and thought content normal.   Lab Results  Component Value Date   WBC 5.9 06/24/2013   HGB 13.9 06/24/2013   HCT 41.1 06/24/2013   PLT 256.0 06/24/2013   GLUCOSE 93 06/24/2013   CHOL 215* 06/24/2013   TRIG 88.0 06/24/2013   HDL 66.50 06/24/2013   LDLDIRECT 130.1 06/24/2013   ALT 15 06/24/2013   AST 18 06/24/2013   NA 139 06/24/2013   K 4.6 06/24/2013   CL 105 06/24/2013   CREATININE 0.9 06/24/2013   BUN 18 06/24/2013   CO2 30 06/24/2013   TSH  1.14 06/24/2013    Mm Digital Screening Bilateral  07/16/2014   CLINICAL DATA:  Screening.  EXAM: DIGITAL SCREENING BILATERAL MAMMOGRAM WITH CAD  COMPARISON:  Previous exam(s).  ACR Breast Density Category d: The breast tissue is extremely dense, which lowers the sensitivity of mammography.  FINDINGS: There has been no significant change and there are no findings suspicious for malignancy. Images were processed with CAD.  IMPRESSION: No mammographic evidence of malignancy. A result letter of this screening mammogram will be mailed directly to the patient.  RECOMMENDATION: Screening mammogram in one year. (Code:[id number]-01Y)  BI-RADS CATEGORY  1: Negative.   Electronically Signed   By: Andres Shad   On: 07/16/2014 14:28       Assessment & Plan:   AWV/v70.0 - Today patient counseled on age appropriate routine health concerns for screening and prevention, each reviewed and up to date or declined. Immunizations reviewed and up to date or declined. Labs reviewed. Risk factors for depression reviewed and negative. Hearing function and visual acuity are intact. ADLs screened and addressed as needed. Functional ability and level of safety reviewed and appropriate. Education, counseling and referrals performed based on assessed risks today. Patient provided with a copy of personalized plan for preventive services.  Problem List Items Addressed This Visit   HLD (hyperlipidemia)     Never on statin Check lipids annually    Relevant Medications      bisoprolol (ZEBETA) tablet   Other Relevant Orders      Lipid panel   Swallowing dysfunction     Chronic and progressive symptoms  No significant weight loss or regurgitation but significant problem with solids and soft foods Also unable to swallow caps or larger caps - prefers chewable or disolved Reviewed barium swallow 06/2011 with retained pill - ?sinus or diverticular pocket EGD by Dr Hilarie Fredrickson in September 2013 unremarkable.  By report, follow up with ENT  Wilburn Cornelia) who recommended GI re-eval due to "stricture at entrance to esophagus).  s/p re-refer to Dr Hilarie Fredrickson Fall 2014 for evaluation of upper esophagus as cause for swallowing dysfunction - unimproved with dilatation and no other options at this time    Relevant Orders      Basic metabolic panel      CBC with Differential      Hepatic function panel      TSH   Unifocal PVCs     symptomatic bradycardia with bigemeny Bystolic discontinued in August of 2014 for symptomatic bradycardia and Digoxin started.  No change in symptoms Summer 2014 cardiology ordered 48 hour holter which demonstrated 14% PVC's, echocardiogram with normal LV function.   Bystolic resumed by cardiology and Dig DC'd, but pt still with symptomatic PVC's and functional bradycardia with related fatigue. s/p EP evaluation - changed dig to flecanide and doing well - on zetbeta only 1/2 MWF  The current medical regimen is effective;  continue present plan and medications.     Relevant Medications      bisoprolol (ZEBETA) tablet   Other Relevant Orders      Basic metabolic panel      CBC with Differential      Hepatic function panel      Lipid panel      TSH    Other Visit Diagnoses   Routine general medical examination at a health care facility    -  Primary    Relevant Orders       Basic metabolic panel       CBC with Differential       Hepatic function panel       Lipid panel       TSH    Need for prophylactic vaccination and inoculation against influenza        Relevant Orders       Flu vaccine HIGH DOSE PF (Fluzone Tri High dose) (Completed)

## 2014-08-04 NOTE — Assessment & Plan Note (Signed)
symptomatic bradycardia with bigemeny Bystolic discontinued in August of 2014 for symptomatic bradycardia and Digoxin started.  No change in symptoms Summer 2014 cardiology ordered 48 hour holter which demonstrated 14% PVC's, echocardiogram with normal LV function.   Bystolic resumed by cardiology and Dig DC'd, but pt still with symptomatic PVC's and functional bradycardia with related fatigue. s/p EP evaluation - changed dig to flecanide and doing well - on zetbeta only 1/2 MWF The current medical regimen is effective;  continue present plan and medications.

## 2014-08-04 NOTE — Patient Instructions (Signed)
It was good to see you today.  We have reviewed your prior records including labs and tests today  Your annual flu shot was given and/or updated today.  Health Maintenance reviewed - all recommended immunizations and age-appropriate screenings are up-to-date.  Test(s) ordered today. Your results will be released to [place] (or called to you) after review, usually within 72hours after test completion. If any changes need to be made, you will be notified at that same time.  Medications reviewed and updated, no changes recommended at this time.  Please schedule followup in 12 months for annual exam and labs, call sooner if problems.

## 2014-08-20 ENCOUNTER — Other Ambulatory Visit: Payer: Self-pay | Admitting: Internal Medicine

## 2014-10-27 ENCOUNTER — Other Ambulatory Visit: Payer: Self-pay | Admitting: Dermatology

## 2014-10-27 DIAGNOSIS — D1801 Hemangioma of skin and subcutaneous tissue: Secondary | ICD-10-CM | POA: Diagnosis not present

## 2014-10-27 DIAGNOSIS — D485 Neoplasm of uncertain behavior of skin: Secondary | ICD-10-CM | POA: Diagnosis not present

## 2014-10-27 DIAGNOSIS — L570 Actinic keratosis: Secondary | ICD-10-CM | POA: Diagnosis not present

## 2014-10-27 DIAGNOSIS — L821 Other seborrheic keratosis: Secondary | ICD-10-CM | POA: Diagnosis not present

## 2014-10-27 DIAGNOSIS — L820 Inflamed seborrheic keratosis: Secondary | ICD-10-CM | POA: Diagnosis not present

## 2014-10-27 DIAGNOSIS — D2272 Melanocytic nevi of left lower limb, including hip: Secondary | ICD-10-CM | POA: Diagnosis not present

## 2014-10-27 DIAGNOSIS — L814 Other melanin hyperpigmentation: Secondary | ICD-10-CM | POA: Diagnosis not present

## 2014-10-27 DIAGNOSIS — D225 Melanocytic nevi of trunk: Secondary | ICD-10-CM | POA: Diagnosis not present

## 2014-10-27 DIAGNOSIS — Z85828 Personal history of other malignant neoplasm of skin: Secondary | ICD-10-CM | POA: Diagnosis not present

## 2015-05-24 DIAGNOSIS — Z85828 Personal history of other malignant neoplasm of skin: Secondary | ICD-10-CM | POA: Diagnosis not present

## 2015-05-24 DIAGNOSIS — L239 Allergic contact dermatitis, unspecified cause: Secondary | ICD-10-CM | POA: Diagnosis not present

## 2015-06-11 ENCOUNTER — Other Ambulatory Visit: Payer: Self-pay

## 2015-06-11 DIAGNOSIS — Z1231 Encounter for screening mammogram for malignant neoplasm of breast: Secondary | ICD-10-CM

## 2015-07-16 DIAGNOSIS — H26491 Other secondary cataract, right eye: Secondary | ICD-10-CM | POA: Diagnosis not present

## 2015-07-22 ENCOUNTER — Ambulatory Visit
Admission: RE | Admit: 2015-07-22 | Discharge: 2015-07-22 | Disposition: A | Discharge status: Home / Self Care | Payer: Medicare Other | Source: Ambulatory Visit

## 2015-07-22 DIAGNOSIS — Z1231 Encounter for screening mammogram for malignant neoplasm of breast: Secondary | ICD-10-CM

## 2015-08-12 ENCOUNTER — Encounter: Payer: Medicare Other | Admitting: Internal Medicine

## 2015-08-25 ENCOUNTER — Other Ambulatory Visit (INDEPENDENT_AMBULATORY_CARE_PROVIDER_SITE_OTHER): Payer: Medicare Other

## 2015-08-25 ENCOUNTER — Encounter: Payer: Self-pay | Admitting: Internal Medicine

## 2015-08-25 ENCOUNTER — Ambulatory Visit (INDEPENDENT_AMBULATORY_CARE_PROVIDER_SITE_OTHER)
Admission: RE | Admit: 2015-08-25 | Discharge: 2015-08-25 | Disposition: A | Discharge status: Home / Self Care | Payer: Medicare Other | Source: Ambulatory Visit | Attending: Internal Medicine | Admitting: Internal Medicine

## 2015-08-25 ENCOUNTER — Ambulatory Visit (INDEPENDENT_AMBULATORY_CARE_PROVIDER_SITE_OTHER): Payer: Medicare Other | Admitting: Internal Medicine

## 2015-08-25 VITALS — BP 138/84 | HR 72 | Temp 97.6°F | Ht 61.75 in | Wt 107.5 lb

## 2015-08-25 DIAGNOSIS — Z0000 Encounter for general adult medical examination without abnormal findings: Secondary | ICD-10-CM

## 2015-08-25 DIAGNOSIS — R059 Cough, unspecified: Secondary | ICD-10-CM

## 2015-08-25 DIAGNOSIS — R1310 Dysphagia, unspecified: Secondary | ICD-10-CM

## 2015-08-25 DIAGNOSIS — E785 Hyperlipidemia, unspecified: Secondary | ICD-10-CM

## 2015-08-25 DIAGNOSIS — R05 Cough: Secondary | ICD-10-CM

## 2015-08-25 DIAGNOSIS — Z23 Encounter for immunization: Secondary | ICD-10-CM | POA: Diagnosis not present

## 2015-08-25 DIAGNOSIS — M810 Age-related osteoporosis without current pathological fracture: Secondary | ICD-10-CM

## 2015-08-25 DIAGNOSIS — Z299 Encounter for prophylactic measures, unspecified: Secondary | ICD-10-CM

## 2015-08-25 LAB — LIPID PANEL
CHOLESTEROL: 227 mg/dL — AB (ref 0–200)
HDL: 72.6 mg/dL (ref >39.00)
LDL CALC: 135 mg/dL — AB (ref 0–99)
NonHDL: 154.11
TRIGLYCERIDES: 97 mg/dL (ref 0.0–149.0)
Total CHOL/HDL Ratio: 3
VLDL: 19.4 mg/dL (ref 0.0–40.0)

## 2015-08-25 LAB — CBC WITH DIFFERENTIAL/PLATELET
Basophils Absolute: 0 10*3/uL (ref 0.0–0.1)
Basophils Relative: 0.6 % (ref 0.0–3.0)
EOS PCT: 1.4 % (ref 0.0–5.0)
Eosinophils Absolute: 0.1 10*3/uL (ref 0.0–0.7)
HCT: 39.2 % (ref 36.0–46.0)
Hemoglobin: 13.1 g/dL (ref 12.0–15.0)
LYMPHS ABS: 2.4 10*3/uL (ref 0.7–4.0)
Lymphocytes Relative: 38.1 % (ref 12.0–46.0)
MCHC: 33.4 g/dL (ref 30.0–36.0)
MCV: 94 fl (ref 78.0–100.0)
Monocytes Absolute: 0.5 10*3/uL (ref 0.1–1.0)
Monocytes Relative: 7.9 % (ref 3.0–12.0)
NEUTROS ABS: 3.3 10*3/uL (ref 1.4–7.7)
NEUTROS PCT: 52 % (ref 43.0–77.0)
PLATELETS: 281 10*3/uL (ref 150.0–400.0)
RBC: 4.17 Mil/uL (ref 3.87–5.11)
RDW: 13.5 % (ref 11.5–15.5)
WBC: 6.3 10*3/uL (ref 4.0–10.5)

## 2015-08-25 LAB — BASIC METABOLIC PANEL
BUN: 19 mg/dL (ref 6–23)
CO2: 29 mEq/L (ref 19–32)
Calcium: 9.3 mg/dL (ref 8.4–10.5)
Chloride: 103 mEq/L (ref 96–112)
Creatinine, Ser: 0.94 mg/dL (ref 0.40–1.20)
GFR: 60.32 mL/min (ref >60.00)
GLUCOSE: 97 mg/dL (ref 70–99)
POTASSIUM: 4.1 meq/L (ref 3.5–5.1)
Sodium: 140 mEq/L (ref 135–145)

## 2015-08-25 LAB — TSH: TSH: 1.2 u[IU]/mL (ref 0.35–4.50)

## 2015-08-25 LAB — HEPATIC FUNCTION PANEL
ALT: 9 U/L (ref 0–35)
AST: 18 U/L (ref 0–37)
Albumin: 3.9 g/dL (ref 3.5–5.2)
Alkaline Phosphatase: 42 U/L (ref 39–117)
BILIRUBIN TOTAL: 0.6 mg/dL (ref 0.2–1.2)
Bilirubin, Direct: 0.1 mg/dL (ref 0.0–0.3)
Total Protein: 7 g/dL (ref 6.0–8.3)

## 2015-08-25 NOTE — Patient Instructions (Addendum)
It was good to see you today.  We have reviewed your prior records including labs and tests today  Health Maintenance reviewed - annual flu shot and second pneumonia vaccine (Prevnat 13) updated today - all other recommended immunizations and age-appropriate screenings are up-to-date.  Test(s) ordered today - labs and chest xray. Your results will be released to [place] (or called to you) after review, usually within 72hours after test completion. If any changes need to be made, you will be notified at that same time.  Medications reviewed and updated, no changes recommended at this time. Refill on medication(s) as discussed today.  we'll make referral to gastroenterology for your swallow problems. Our office will contact you regarding appointment(s) once made.  Please schedule followup in 12 months for annual exam, call sooner if problems.   Health Maintenance, Female Adopting a healthy lifestyle and getting preventive care can go a long way to promote health and wellness. Talk with your health care provider about what schedule of regular examinations is right for you. This is a good chance for you to check in with your provider about disease prevention and staying healthy. In between checkups, there are plenty of things you can do on your own. Experts have done a lot of research about which lifestyle changes and preventive measures are most likely to keep you healthy. Ask your health care provider for more information. WEIGHT AND DIET  Eat a healthy diet  Be sure to include plenty of vegetables, fruits, low-fat dairy products, and lean protein.  Do not eat a lot of foods high in solid fats, added sugars, or salt.  Get regular exercise. This is one of the most important things you can do for your health.  Most adults should exercise for at least 150 minutes each week. The exercise should increase your heart rate and make you sweat (moderate-intensity exercise).  Most adults should also  do strengthening exercises at least twice a week. This is in addition to the moderate-intensity exercise.  Maintain a healthy weight  Body mass index (BMI) is a measurement that can be used to identify possible weight problems. It estimates body fat based on height and weight. Your health care provider can help determine your BMI and help you achieve or maintain a healthy weight.  For females 24 years of age and older:   A BMI below 18.5 is considered underweight.  A BMI of 18.5 to 24.9 is normal.  A BMI of 25 to 29.9 is considered overweight.  A BMI of 30 and above is considered obese.  Watch levels of cholesterol and blood lipids  You should start having your blood tested for lipids and cholesterol at 79 years of age, then have this test every 5 years.  You may need to have your cholesterol levels checked more often if:  Your lipid or cholesterol levels are high.  You are older than 79 years of age.  You are at high risk for heart disease.  CANCER SCREENING   Lung Cancer  Lung cancer screening is recommended for adults 46-3 years old who are at high risk for lung cancer because of a history of smoking.  A yearly low-dose CT scan of the lungs is recommended for people who:  Currently smoke.  Have quit within the past 15 years.  Have at least a 30-pack-year history of smoking. A pack year is smoking an average of one pack of cigarettes a day for 1 year.  Yearly screening should continue until it  has been 15 years since you quit.  Yearly screening should stop if you develop a health problem that would prevent you from having lung cancer treatment.  Breast Cancer  Practice breast self-awareness. This means understanding how your breasts normally appear and feel.  It also means doing regular breast self-exams. Let your health care provider know about any changes, no matter how small.  If you are in your 20s or 30s, you should have a clinical breast exam (CBE) by a  health care provider every 1-3 years as part of a regular health exam.  If you are 23 or older, have a CBE every year. Also consider having a breast X-ray (mammogram) every year.  If you have a family history of breast cancer, talk to your health care provider about genetic screening.  If you are at high risk for breast cancer, talk to your health care provider about having an MRI and a mammogram every year.  Breast cancer gene (BRCA) assessment is recommended for women who have family members with BRCA-related cancers. BRCA-related cancers include:  Breast.  Ovarian.  Tubal.  Peritoneal cancers.  Results of the assessment will determine the need for genetic counseling and BRCA1 and BRCA2 testing. Cervical Cancer Your health care provider may recommend that you be screened regularly for cancer of the pelvic organs (ovaries, uterus, and vagina). This screening involves a pelvic examination, including checking for microscopic changes to the surface of your cervix (Pap test). You may be encouraged to have this screening done every 3 years, beginning at age 10.  For women ages 24-65, health care providers may recommend pelvic exams and Pap testing every 3 years, or they may recommend the Pap and pelvic exam, combined with testing for human papilloma virus (HPV), every 5 years. Some types of HPV increase your risk of cervical cancer. Testing for HPV may also be done on women of any age with unclear Pap test results.  Other health care providers may not recommend any screening for nonpregnant women who are considered low risk for pelvic cancer and who do not have symptoms. Ask your health care provider if a screening pelvic exam is right for you.  If you have had past treatment for cervical cancer or a condition that could lead to cancer, you need Pap tests and screening for cancer for at least 20 years after your treatment. If Pap tests have been discontinued, your risk factors (such as having a  new sexual partner) need to be reassessed to determine if screening should resume. Some women have medical problems that increase the chance of getting cervical cancer. In these cases, your health care provider may recommend more frequent screening and Pap tests. Colorectal Cancer  This type of cancer can be detected and often prevented.  Routine colorectal cancer screening usually begins at 79 years of age and continues through 79 years of age.  Your health care provider may recommend screening at an earlier age if you have risk factors for colon cancer.  Your health care provider may also recommend using home test kits to check for hidden blood in the stool.  A small camera at the end of a tube can be used to examine your colon directly (sigmoidoscopy or colonoscopy). This is done to check for the earliest forms of colorectal cancer.  Routine screening usually begins at age 80.  Direct examination of the colon should be repeated every 5-10 years through 79 years of age. However, you may need to be screened more  often if early forms of precancerous polyps or small growths are found. Skin Cancer  Check your skin from head to toe regularly.  Tell your health care provider about any new moles or changes in moles, especially if there is a change in a mole's shape or color.  Also tell your health care provider if you have a mole that is larger than the size of a pencil eraser.  Always use sunscreen. Apply sunscreen liberally and repeatedly throughout the day.  Protect yourself by wearing long sleeves, pants, a wide-brimmed hat, and sunglasses whenever you are outside. HEART DISEASE, DIABETES, AND HIGH BLOOD PRESSURE   High blood pressure causes heart disease and increases the risk of stroke. High blood pressure is more likely to develop in:  People who have blood pressure in the high end of the normal range (130-139/85-89 mm Hg).  People who are overweight or obese.  People who are  African American.  If you are 63-45 years of age, have your blood pressure checked every 3-5 years. If you are 72 years of age or older, have your blood pressure checked every year. You should have your blood pressure measured twice--once when you are at a hospital or clinic, and once when you are not at a hospital or clinic. Record the average of the two measurements. To check your blood pressure when you are not at a hospital or clinic, you can use:  An automated blood pressure machine at a pharmacy.  A home blood pressure monitor.  If you are between 46 years and 98 years old, ask your health care provider if you should take aspirin to prevent strokes.  Have regular diabetes screenings. This involves taking a blood sample to check your fasting blood sugar level.  If you are at a normal weight and have a low risk for diabetes, have this test once every three years after 79 years of age.  If you are overweight and have a high risk for diabetes, consider being tested at a younger age or more often. PREVENTING INFECTION  Hepatitis B  If you have a higher risk for hepatitis B, you should be screened for this virus. You are considered at high risk for hepatitis B if:  You were born in a country where hepatitis B is common. Ask your health care provider which countries are considered high risk.  Your parents were born in a high-risk country, and you have not been immunized against hepatitis B (hepatitis B vaccine).  You have HIV or AIDS.  You use needles to inject street drugs.  You live with someone who has hepatitis B.  You have had sex with someone who has hepatitis B.  You get hemodialysis treatment.  You take certain medicines for conditions, including cancer, organ transplantation, and autoimmune conditions. Hepatitis C  Blood testing is recommended for:  Everyone born from [birth year] through 1963.  Anyone with known risk factors for hepatitis C. Sexually transmitted infections  (STIs)  You should be screened for sexually transmitted infections (STIs) including gonorrhea and chlamydia if:  You are sexually active and are younger than 79 years of age.  You are older than 79 years of age and your health care provider tells you that you are at risk for this type of infection.  Your sexual activity has changed since you were last screened and you are at an increased risk for chlamydia or gonorrhea. Ask your health care provider if you are at risk.  If you do not have  HIV, but are at risk, it may be recommended that you take a prescription medicine daily to prevent HIV infection. This is called pre-exposure prophylaxis (PrEP). You are considered at risk if:  You are sexually active and do not regularly use condoms or know the HIV status of your partner(s).  You take drugs by injection.  You are sexually active with a partner who has HIV. Talk with your health care provider about whether you are at high risk of being infected with HIV. If you choose to begin PrEP, you should first be tested for HIV. You should then be tested every 3 months for as long as you are taking PrEP.  PREGNANCY   If you are premenopausal and you may become pregnant, ask your health care provider about preconception counseling.  If you may become pregnant, take 400 to 800 micrograms (mcg) of folic acid every day.  If you want to prevent pregnancy, talk to your health care provider about birth control (contraception). OSTEOPOROSIS AND MENOPAUSE   Osteoporosis is a disease in which the bones lose minerals and strength with aging. This can result in serious bone fractures. Your risk for osteoporosis can be identified using a bone density scan.  If you are 10 years of age or older, or if you are at risk for osteoporosis and fractures, ask your health care provider if you should be screened.  Ask your health care provider whether you should take a calcium or vitamin D supplement to lower your risk  for osteoporosis.  Menopause may have certain physical symptoms and risks.  Hormone replacement therapy may reduce some of these symptoms and risks. Talk to your health care provider about whether hormone replacement therapy is right for you.  HOME CARE INSTRUCTIONS   Schedule regular health, dental, and eye exams.  Stay current with your immunizations.   Do not use any tobacco products including cigarettes, chewing tobacco, or electronic cigarettes.  If you are pregnant, do not drink alcohol.  If you are breastfeeding, limit how much and how often you drink alcohol.  Limit alcohol intake to no more than 1 drink per day for nonpregnant women. One drink equals 12 ounces of beer, 5 ounces of wine, or 1 ounces of hard liquor.  Do not use street drugs.  Do not share needles.  Ask your health care provider for help if you need support or information about quitting drugs.  Tell your health care provider if you often feel depressed.  Tell your health care provider if you have ever been abused or do not feel safe at home.   This information is not intended to replace advice given to you by your health care provider. Make sure you discuss any questions you have with your health care provider.   Document Released: 05/08/2011 Document Revised: 11/13/2014 Document Reviewed: 09/24/2013 Elsevier Interactive Patient Education [employer].

## 2015-08-25 NOTE — Assessment & Plan Note (Signed)
Chronic and progressive symptoms, now associated with cough (dry) at swallow effort x 2-78mo No significant weight loss or regurgitation but significant problem with solids and soft foods > liquids Also unable to swallow caps or larger caps - prefers chewable or disolved Reviewed barium swallow 06/2011 with retained pill - ?sinus or diverticular pocket EGD by Dr Hilarie Fredrickson x 2 in September 2013 (unremarkable) and then Oct 2014 with dilation of unobst esoph stricture but unimproved with dilatation and no other options at this time By report, prior follow up with ENT Wilburn Cornelia) who recommended GI re-eval in Fall 2014 (see above) Also check CXR (no prior hx smoker)

## 2015-08-25 NOTE — Progress Notes (Signed)
Pre visit review using our clinic review tool, if applicable. No additional management support is needed unless otherwise documented below in the visit note. 

## 2015-08-25 NOTE — Progress Notes (Signed)
Subjective:    Patient ID: Michelle Vazquez, female    DOB: 04/02/1929, 81 y.o.   MRN: [id number]  HPI   Here for medicare wellness  Diet: heart healthy  Physical activity: sedentary Depression/mood screen: negative Hearing: intact to whispered voice Visual acuity: grossly normal, performs annual eye exam  ADLs: capable Fall risk: none Home safety: good Cognitive evaluation: intact to orientation, naming, recall and repetition EOL planning: adv directives, full code/ I agree  I have personally reviewed and have noted 1. The patient's medical and social history 2. Their use of alcohol, tobacco or illicit drugs 3. Their current medications and supplements 4. The patient's functional ability including ADL's, fall risks, home safety risks and hearing or visual impairment. 5. Diet and physical activities 6. Evidence for depression or mood disorders Also reviewed chronic medical conditions, interval events and current concerns   Past Medical History  Diagnosis Date  . Mitral valve prolapse   . Parathyroid adenoma     post parathyroid surgery in October of 2008  . Premature ventricular contractions (PVCs) (VPCs)   . Paroxysmal supraventricular tachycardia ([place])   . Arthritis   . Urinary incontinence   . Atrial fibrillation ([place])   . HLD (hyperlipidemia)   . Bowel obstruction ([place]) 2000  . Squamous cell carcinoma in situ of skin     face  . Cataract     bilateral removed  . Osteoporosis     declines rx and declines DEXA f/u  . Schatzki's ring 08/2013    Dr. Hilarie Fredrickson, s/p dilation 08/2013, ?benefit   Family History  Problem Relation Age of Onset  . Arthritis Father   . Heart disease Father   . Hypertension Father   . Stroke Father   . Heart attack Father   . Arthritis Mother   . Stroke Mother   . Stroke Brother   . Diabetes Brother   . Hypertension Brother   . Colon cancer Sister   . Hyperlipidemia Sister   . Hypertension Sister   . Hypertension Sister   .  Esophageal cancer Neg Hx   . Rectal cancer Neg Hx   . Stomach cancer Neg Hx    Social History  Substance Use Topics  . Smoking status: Never Smoker   . Smokeless tobacco: Never Used  . Alcohol Use: No    Review of Systems  Constitutional: Positive for fatigue (exertional). Negative for unexpected weight change.  Respiratory: Positive for cough (dry with swallow, see dysphagia). Negative for shortness of breath and wheezing.   Cardiovascular: Negative for chest pain, palpitations and leg swelling.  Gastrointestinal: Negative for nausea, abdominal pain and diarrhea.       Chronic dysphagia, ?worsening since last GI eval and esoph dilation in 08/2013  Musculoskeletal: Positive for arthralgias (chronic, esp hands). Negative for joint swelling.  Neurological: Negative for dizziness, weakness, light-headedness and headaches.  Psychiatric/Behavioral: Negative for dysphoric mood. The patient is not nervous/anxious.   All other systems reviewed and are negative.   Patient Care Team: Rowe Clack, MD as PCP - General (Internal Medicine) Darlin Coco, MD (Cardiology) Jerene Bears, MD (Gastroenterology) Evans Lance, MD (Cardiology)     Objective:    Physical Exam  Constitutional: She appears well-developed and well-nourished. No distress.  Cardiovascular: Normal rate, regular rhythm and normal heart sounds.   No murmur heard. Pulmonary/Chest: Effort normal and breath sounds normal. No respiratory distress.  Musculoskeletal: She exhibits no edema.    BP 138/84 mmHg  Pulse  72  Temp(Src) 97.6 F (36.4 C)  Ht 5' 1.75" (1.568 m)  Wt 107 lb 8 oz (48.762 kg)  BMI 19.83 kg/m2  SpO2 98% Wt Readings from Last 3 Encounters:  08/25/15 107 lb 8 oz (48.762 kg)  08/04/14 107 lb 12 oz (48.875 kg)  03/06/14 108 lb (48.988 kg)     Lab Results  Component Value Date   WBC 5.5 08/04/2014   HGB 13.5 08/04/2014   HCT 40.3 08/04/2014   PLT 279.0 08/04/2014   GLUCOSE 96  08/04/2014   CHOL 241* 08/04/2014   TRIG 71.0 08/04/2014   HDL 76.30 08/04/2014   LDLDIRECT 130.1 06/24/2013   LDLCALC 151* 08/04/2014   ALT 11 08/04/2014   AST 22 08/04/2014   NA 140 08/04/2014   K 5.3* 08/04/2014   CL 105 08/04/2014   CREATININE 0.8 08/04/2014   BUN 18 08/04/2014   CO2 31 08/04/2014   TSH 1.24 08/04/2014    Mm Digital Screening Bilateral  07/22/2015  CLINICAL DATA:  Screening. EXAM: DIGITAL SCREENING BILATERAL MAMMOGRAM WITH CAD COMPARISON:  Previous exam(s). ACR Breast Density Category c: The breast tissue is heterogeneously dense, which may obscure small masses. FINDINGS: There are no findings suspicious for malignancy. Images were processed with CAD. IMPRESSION: No mammographic evidence of malignancy. A result letter of this screening mammogram will be mailed directly to the patient. RECOMMENDATION: Screening mammogram in one year. (Code:SM-B-01Y) BI-RADS CATEGORY  1: Negative. Electronically Signed   By: Franki Cabot M.D.   On: 07/22/2015 13:13       Assessment & Plan:   AWV/z00.00 - Today patient counseled on age appropriate routine health concerns for screening and prevention, each reviewed and up to date or declined. Immunizations reviewed and up to date or declined. Labs/ECG reviewed. Risk factors for depression reviewed and negative. Hearing function and visual acuity are intact. ADLs screened and addressed as needed. Functional ability and level of safety reviewed and appropriate. Education, counseling and referrals performed based on assessed risks today. Patient provided with a copy of personalized plan for preventive services.  Problem List Items Addressed This Visit    HLD (hyperlipidemia)    Never on statin Check lipids annually      Osteoporosis   Relevant Orders   Basic metabolic panel   TSH   Swallowing dysfunction    Chronic and progressive symptoms, now associated with cough (dry) at swallow effort x 2-41mo No significant weight loss or  regurgitation but significant problem with solids and soft foods > liquids Also unable to swallow caps or larger caps - prefers chewable or disolved Reviewed barium swallow 06/2011 with retained pill - ?sinus or diverticular pocket EGD by Dr Hilarie Fredrickson x 2 in September 2013 (unremarkable) and then Oct 2014 with dilation of unobst esoph stricture but unimproved with dilatation and no other options at this time By report, prior follow up with ENT Wilburn Cornelia) who recommended GI re-eval in Fall 2014 (see above) Also check CXR (no prior hx smoker)      Relevant Orders   Basic metabolic panel   CBC with Differential/Platelet   Hepatic function panel   TSH   DG Chest 2 View (Completed)   Ambulatory referral to Gastroenterology    Other Visit Diagnoses    Routine general medical examination at a health care facility    -  Primary    Cough        Relevant Orders    Basic metabolic panel    CBC with Differential/Platelet  Hepatic function panel    TSH    DG Chest 2 View (Completed)    Ambulatory referral to Gastroenterology    Hyperlipidemia        Relevant Orders    Lipid panel    Need for prophylactic vaccination and inoculation against influenza        Relevant Orders    Flu Vaccine QUAD 36+ mos IM (Completed)    Need for prophylactic measure        Need for prophylactic vaccination against Streptococcus pneumoniae (pneumococcus)        Relevant Orders    Pneumococcal conjugate vaccine 13-valent (Completed)        Gwendolyn Grant, MD

## 2015-08-25 NOTE — Assessment & Plan Note (Signed)
Never on statin Check lipids annually

## 2015-08-26 ENCOUNTER — Other Ambulatory Visit: Payer: Self-pay | Admitting: Internal Medicine

## 2015-10-01 IMAGING — CR DG FOOT 2V*L*
2 series · 2 of 2 positions shown · non-contrast
Comparison: None

CLINICAL DATA: Left foot pain, toe swelling and bruising, fall
09/24/2013

EXAM:
LEFT FOOT - 2 VIEW

[view not recorded (1 of 2)]
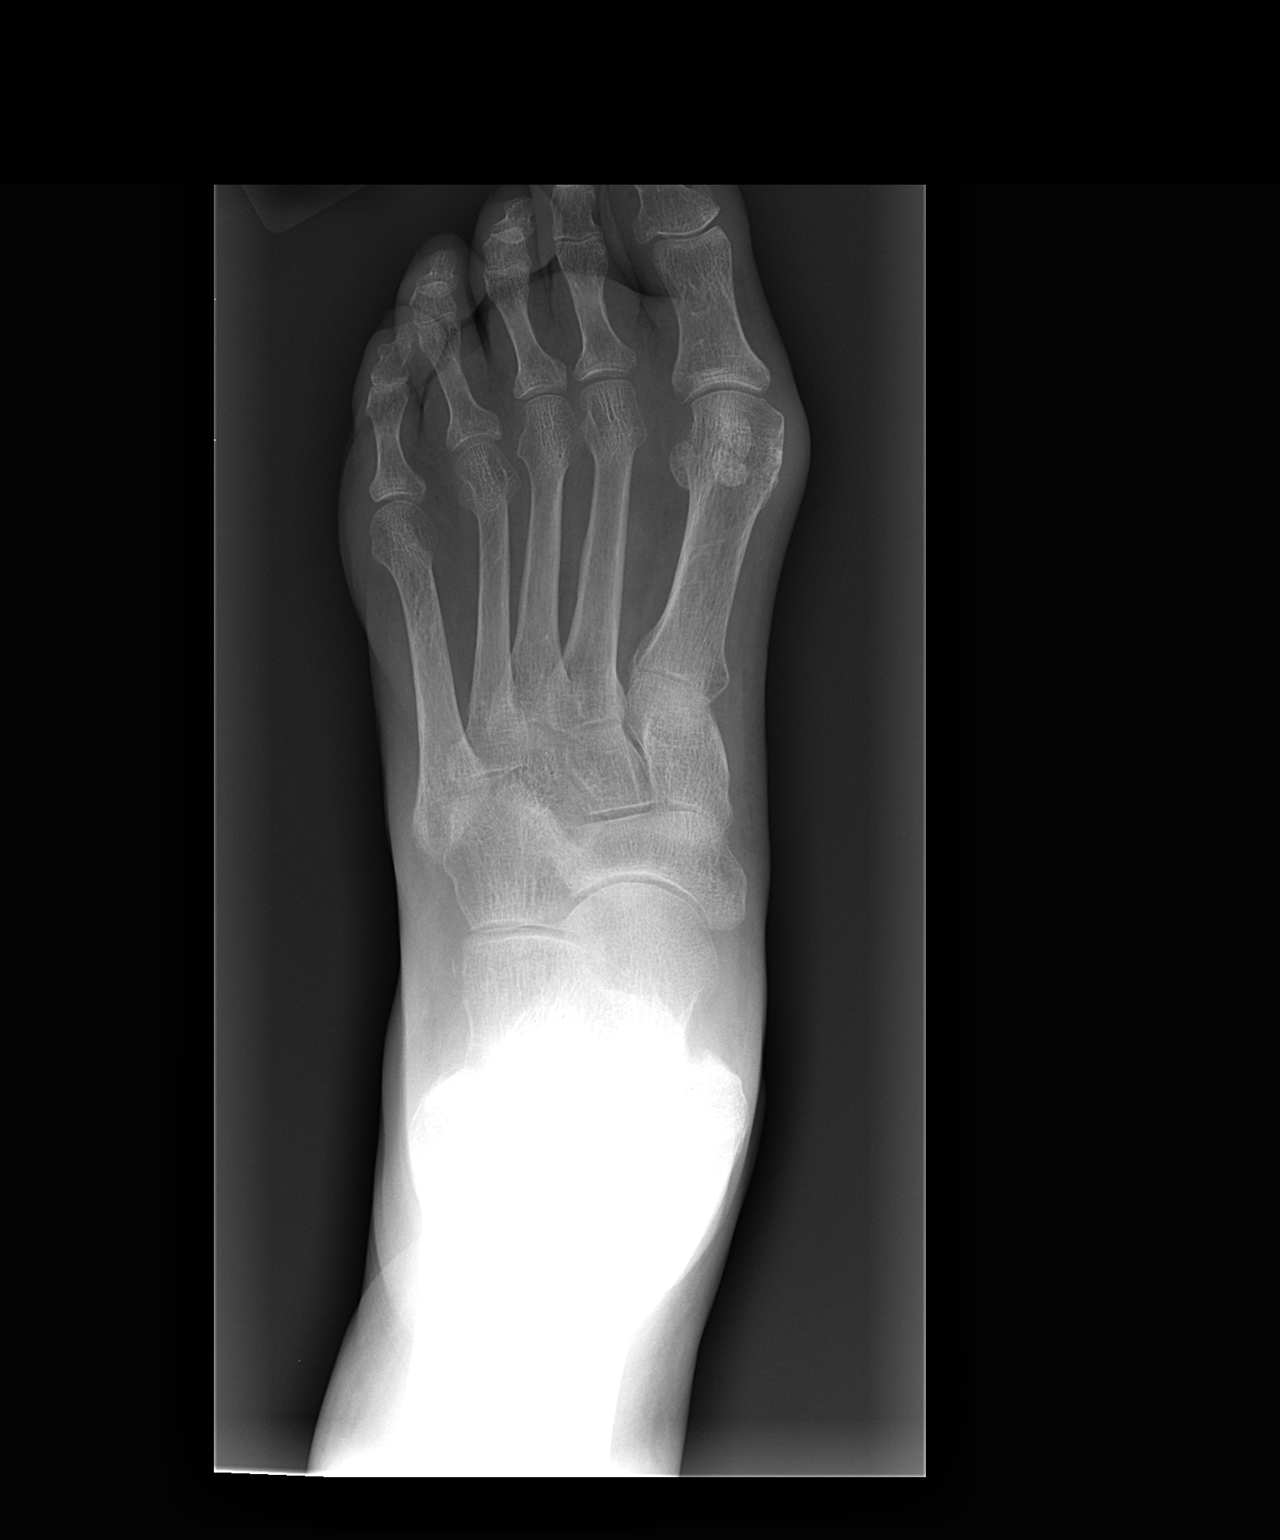

[view not recorded (2 of 2)]
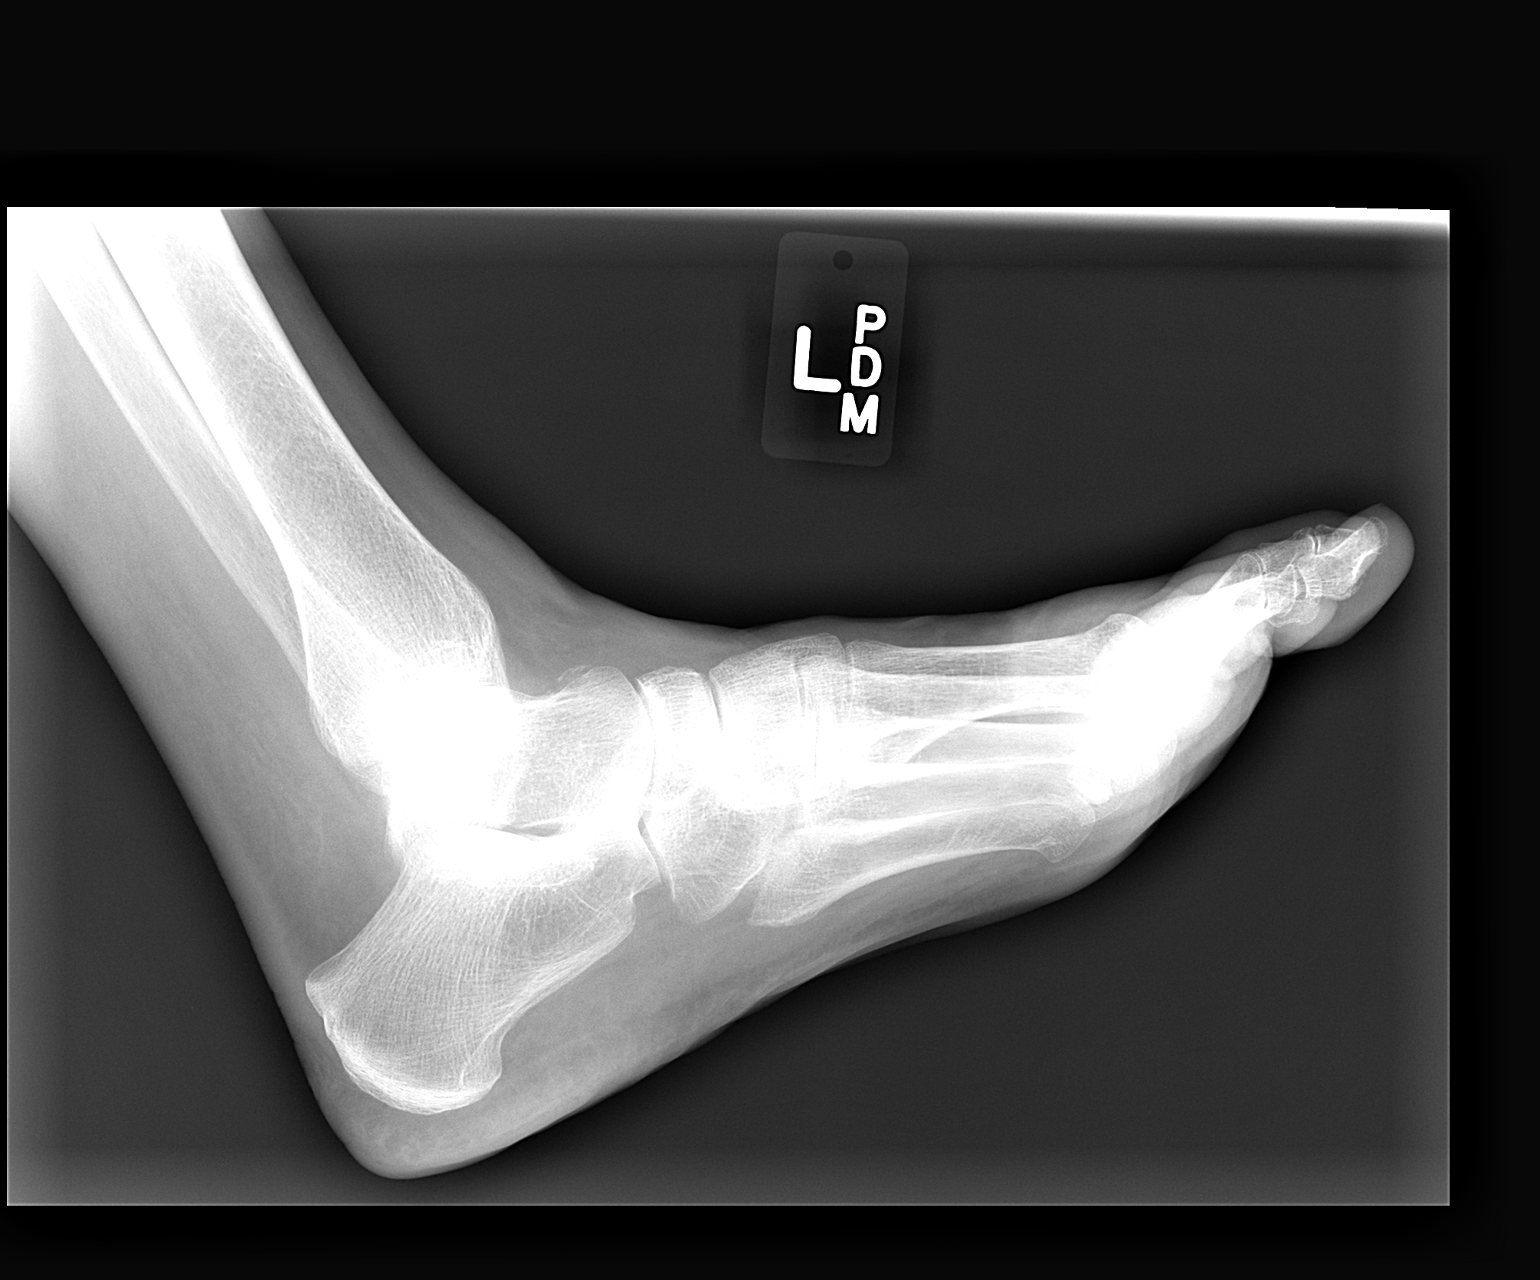

[2 of 2 positions shown; findings below may reference images not displayed]

FINDINGS: Diffuse osseous demineralization.

Joint spaces preserved.

Thin linear calcific density identified dorsal to the distal talus,
cannot exclude a small capsular avulsion fracture.

No additional fracture, dislocation or bone destruction.
IMPRESSION: Thin linear bone density adjacent to the dorsal margin of the distal
talus, cannot exclude small capsular avulsion fracture; recommend
correlation for pain/tenderness at this site.

## 2015-10-05 ENCOUNTER — Ambulatory Visit (INDEPENDENT_AMBULATORY_CARE_PROVIDER_SITE_OTHER)
Admission: RE | Admit: 2015-10-05 | Discharge: 2015-10-05 | Disposition: A | Discharge status: Home / Self Care | Payer: Medicare Other | Source: Ambulatory Visit | Attending: Family | Admitting: Family

## 2015-10-05 ENCOUNTER — Ambulatory Visit (INDEPENDENT_AMBULATORY_CARE_PROVIDER_SITE_OTHER): Payer: Medicare Other | Admitting: Family

## 2015-10-05 ENCOUNTER — Encounter: Payer: Self-pay | Admitting: Family

## 2015-10-05 VITALS — BP 136/80 | HR 91 | Temp 98.2°F | Resp 18 | Ht 61.75 in | Wt 104.0 lb

## 2015-10-05 DIAGNOSIS — R197 Diarrhea, unspecified: Secondary | ICD-10-CM | POA: Diagnosis not present

## 2015-10-05 DIAGNOSIS — K529 Noninfective gastroenteritis and colitis, unspecified: Secondary | ICD-10-CM | POA: Insufficient documentation

## 2015-10-05 MED ORDER — ONDANSETRON HCL 4 MG/2ML IJ SOLN
2.0000 mg | Freq: Once | INTRAMUSCULAR | Status: AC
Start: 2015-10-05 — End: 2015-10-05
  Administered 2015-10-05: 2 mg via INTRAMUSCULAR

## 2015-10-05 NOTE — Patient Instructions (Signed)
Thank you for choosing [facility].  Summary/Instructions:   If your symptoms worsen or fail to improve, please contact our office for further instruction, or in case of emergency go directly to the emergency room at the closest medical facility.    Viral Gastroenteritis Viral gastroenteritis is also known as stomach flu. This condition affects the stomach and intestinal tract. It can cause sudden diarrhea and vomiting. The illness typically lasts 3 to 8 days. Most people develop an immune response that eventually gets rid of the virus. While this natural response develops, the virus can make you quite ill. CAUSES  Many different viruses can cause gastroenteritis, such as rotavirus or noroviruses. You can catch one of these viruses by consuming contaminated food or water. You may also catch a virus by sharing utensils or other personal items with an infected person or by touching a contaminated surface. SYMPTOMS  The most common symptoms are diarrhea and vomiting. These problems can cause a severe loss of body fluids (dehydration) and a body salt (electrolyte) imbalance. Other symptoms may include:  Fever.  Headache.  Fatigue.  Abdominal pain. DIAGNOSIS  Your caregiver can usually diagnose viral gastroenteritis based on your symptoms and a physical exam. A stool sample may also be taken to test for the presence of viruses or other infections. TREATMENT  This illness typically goes away on its own. Treatments are aimed at rehydration. The most serious cases of viral gastroenteritis involve vomiting so severely that you are not able to keep fluids down. In these cases, fluids must be given through an intravenous line (IV). HOME CARE INSTRUCTIONS   Drink enough fluids to keep your urine clear or pale yellow. Drink small amounts of fluids frequently and increase the amounts as tolerated.  Ask your caregiver for specific rehydration instructions.  Avoid:  Foods high in  sugar.  Alcohol.  Carbonated drinks.  Tobacco.  Juice.  Caffeine drinks.  Extremely hot or cold fluids.  Fatty, greasy foods.  Too much intake of anything at one time.  Dairy products until 24 to 48 hours after diarrhea stops.  You may consume probiotics. Probiotics are active cultures of beneficial bacteria. They may lessen the amount and number of diarrheal stools in adults. Probiotics can be found in yogurt with active cultures and in supplements.  Wash your hands well to avoid spreading the virus.  Only take over-the-counter or prescription medicines for pain, discomfort, or fever as directed by your caregiver. Do not give aspirin to children. Antidiarrheal medicines are not recommended.  Ask your caregiver if you should continue to take your regular prescribed and over-the-counter medicines.  Keep all follow-up appointments as directed by your caregiver. SEEK IMMEDIATE MEDICAL CARE IF:   You are unable to keep fluids down.  You do not urinate at least once every 6 to 8 hours.  You develop shortness of breath.  You notice blood in your stool or vomit. This may look like coffee grounds.  You have abdominal pain that increases or is concentrated in one small area (localized).  You have persistent vomiting or diarrhea.  You have a fever.  The patient is a child younger than 3 months, and he or she has a fever.  The patient is a child older than 3 months, and he or she has a fever and persistent symptoms.  The patient is a child older than 3 months, and he or she has a fever and symptoms suddenly get worse.  The patient is a baby, and he  or she has no tears when crying. MAKE SURE YOU:   Understand these instructions.  Will watch your condition.  Will get help right away if you are not doing well or get worse.   This information is not intended to replace advice given to you by your health care provider. Make sure you discuss any questions you have with your  health care provider.   Document Released: 10/23/2005 Document Revised: 01/15/2012 Document Reviewed: 08/09/2011 Elsevier Interactive Patient Education [employer].

## 2015-10-05 NOTE — Progress Notes (Signed)
Pre visit review using our clinic review tool, if applicable. No additional management support is needed unless otherwise documented below in the visit note. 

## 2015-10-05 NOTE — Assessment & Plan Note (Signed)
Symptoms and exam consistent with a potential viral gastroenteritis, however cannot rule out bowel obstruction given history. Obtain x-ray to rule out obstruction. In office injection of ondansetron provided. Recommend clear liquids and progress diet as tolerated. Follow up if symptoms worsen or fail to improve.

## 2015-10-05 NOTE — Progress Notes (Signed)
Subjective:    Patient ID: Michelle Vazquez, female    DOB: 1929/02/02, 81 y.o.   MRN: [id number]  Chief Complaint  Patient presents with  . Diarrhea    has had diarrhea, vomiting, since this morning can not keep any food or liquids down    HPI:  Michelle Vazquez is a 81 y.o. female who  has a past medical history of Mitral valve prolapse; Parathyroid adenoma; Premature ventricular contractions (PVCs) (VPCs); Paroxysmal supraventricular tachycardia ([place]); Arthritis; Urinary incontinence; Atrial fibrillation ([facility]); HLD (hyperlipidemia); Bowel obstruction ([place]) (2000); Squamous cell carcinoma in situ of skin; Cataract; Osteoporosis; and Schatzki's ring (08/2013). and presents today for an acute office visit.   Associated symptoms of diarrhea and vomiting have been going on since about 5 am this morning. She tired some grape juice and crackers which she was not able to hold it down. Describes a nauseated feeling. She has not had a bowel movement in several hours. Denies fevers or chills. Denies any changes in food or anything that may have triggered it.   Allergies  Allergen Reactions  . Inderal [Propranolol Hcl]     Pt does not remember reaction  . Lopressor [Metoprolol Tartrate]     Per the pt' "I felt like I was going to pass out"  . Penicillins Diarrhea     Current Outpatient Prescriptions on File Prior to Visit  Medication Sig Dispense Refill  . bisoprolol (ZEBETA) 5 MG tablet Take 0.5 tablets (2.5 mg total) by mouth 3 (three) times a week. Monday, Wednesday and Friday 30 tablet 5  . calcium gluconate 500 MG tablet Take 500 mg by mouth daily.      . flecainide (TAMBOCOR) 50 MG tablet TAKE ONE TABLET BY MOUTH TWICE DAILY. 180 tablet 3  . Multiple Vitamin (MULTIVITAMIN WITH MINERALS) TABS tablet Take 1 tablet by mouth daily.     No current facility-administered medications on file prior to visit.    Review of Systems  Constitutional: Negative for fever and chills.    Gastrointestinal: Positive for nausea, vomiting and diarrhea. Negative for abdominal pain, constipation, blood in stool, abdominal distention and rectal pain.      Objective:    BP 136/80 mmHg  Pulse 91  Temp(Src) 98.2 F (36.8 C) (Oral)  Resp 18  Ht 5' 1.75" (1.568 m)  Wt 104 lb (47.174 kg)  BMI 19.19 kg/m2  SpO2 97% Nursing note and vital signs reviewed.  Physical Exam  Constitutional: She is oriented to person, place, and time. She appears well-developed and well-nourished. No distress.  Cardiovascular: Normal rate, regular rhythm, normal heart sounds and intact distal pulses.   Pulmonary/Chest: Effort normal and breath sounds normal.  Abdominal: Soft. She exhibits no distension and no mass. There is no tenderness. There is no rebound and no guarding.  Neurological: She is alert and oriented to person, place, and time.  Skin: Skin is warm and dry.  Psychiatric: She has a normal mood and affect. Her behavior is normal. Judgment and thought content normal.       Assessment & Plan:   Problem List Items Addressed This Visit      Digestive   Gastroenteritis - Primary    Symptoms and exam consistent with a potential viral gastroenteritis, however cannot rule out bowel obstruction given history. Obtain x-ray to rule out obstruction. In office injection of ondansetron provided. Recommend clear liquids and progress diet as tolerated. Follow up if symptoms worsen or fail to improve.  Relevant Medications   ondansetron (ZOFRAN) injection 2 mg (Completed) (Start on 10/05/2015  5:15 PM)   Other Relevant Orders   DG Abd 2 Views

## 2015-10-06 ENCOUNTER — Encounter: Payer: Self-pay | Admitting: Family

## 2015-10-07 ENCOUNTER — Ambulatory Visit (INDEPENDENT_AMBULATORY_CARE_PROVIDER_SITE_OTHER): Payer: Medicare Other | Admitting: Internal Medicine

## 2015-10-07 ENCOUNTER — Encounter: Payer: Self-pay | Admitting: Internal Medicine

## 2015-10-07 VITALS — BP 120/78 | HR 75 | Ht 62.0 in | Wt 105.0 lb

## 2015-10-07 DIAGNOSIS — I493 Ventricular premature depolarization: Secondary | ICD-10-CM | POA: Diagnosis not present

## 2015-10-07 MED ORDER — BISOPROLOL FUMARATE 5 MG PO TABS: ORAL_TABLET | ORAL | Status: DC

## 2015-10-07 NOTE — Assessment & Plan Note (Signed)
Her PVC's appear to have increased. It is unclear as to what the cause is. Perhaps related to her GI illness. I have asked her to increase her bystolic to 2.5 mg daily and continue flecainide 50 bid. Will ask her to recheck her 24 hour holter in 2 weeks. If she is having more ectopy then I will ask her to increase her flecainide.

## 2015-10-07 NOTE — Patient Instructions (Signed)
Medication Instructions: 1) Increase bisoprolol to 5 mg 1/2 tablet DAILY  Labwork: - none  Procedures/Testing: - Your physician has recommended that you wear a 24 hour holter monitor- in 2 weeks (around 10/21/15 or so). Holter monitors are medical devices that record the heart's electrical activity. Doctors most often use these monitors to diagnose arrhythmias. Arrhythmias are problems with the speed or rhythm of the heartbeat. The monitor is a small, portable device. You can wear one while you do your normal daily activities. This is usually used to diagnose what is causing palpitations/syncope (passing out).   Follow-Up: - Your physician wants you to follow-up in: 1 year with Dr. Lovena Le. You will receive a reminder letter in the mail two months in advance. If you don't receive a letter, please call our office to schedule the follow-up appointment.  Any Additional Special Instructions Will Be Listed Below (If Applicable).

## 2015-10-07 NOTE — Progress Notes (Signed)
HPI Michelle Vazquez returns today for followup of very dense ventricular ectopy. The patient is an 81 year old woman whose health has otherwise been good. She has been bothered by palpitations and feelings of fatigue and weakness for the last several months. She had normal left ventricular systolic function. She wore a 48 hour Holter monitor which demonstrated 11,000-12,000 PVCs in a 24-hour period. She was placed on Flecainide and her symptoms have improved. I have not seen her in a year and she now c/o of a GI bug and has had diarrhea and vomiting. She does not feel her palpitations as much as she once did. Allergies  Allergen Reactions  . Inderal [Propranolol Hcl]     Pt does not remember reaction  . Lopressor [Metoprolol Tartrate]     Per the pt' "I felt like I was going to pass out"  . Penicillins Diarrhea     Current Outpatient Prescriptions  Medication Sig Dispense Refill  . bisoprolol (ZEBETA) 5 MG tablet Take 0.5 tablets (2.5 mg total) by mouth 3 (three) times a week. Monday, Wednesday and Friday 30 tablet 5  . calcium gluconate 500 MG tablet Take 500 mg by mouth daily.      . flecainide (TAMBOCOR) 50 MG tablet TAKE ONE TABLET BY MOUTH TWICE DAILY. 180 tablet 3  . Multiple Vitamin (MULTIVITAMIN WITH MINERALS) TABS tablet Take 1 tablet by mouth daily.     No current facility-administered medications for this visit.     Past Medical History  Diagnosis Date  . Mitral valve prolapse   . Parathyroid adenoma     post parathyroid surgery in October of 2008  . Premature ventricular contractions (PVCs) (VPCs)   . Paroxysmal supraventricular tachycardia ([place])   . Arthritis   . Urinary incontinence   . Atrial fibrillation ([place])   . HLD (hyperlipidemia)   . Bowel obstruction ([place]) 2000  . Squamous cell carcinoma in situ of skin     face  . Cataract     bilateral removed  . Osteoporosis     declines rx and declines DEXA f/u  . Schatzki's ring 08/2013    Dr. Hilarie Fredrickson, s/p  dilation 08/2013, ?benefit    ROS:   All systems reviewed and negative except as noted in the HPI.   Past Surgical History  Procedure Laterality Date  . Shoulder surgery  02/1999    right  . Skin lesion excision  08/1999    left side of face  . Laparoscopic small bowel resection  2000  . Breast biopsy  1979    left  . Abdominal hysterectomy  1989  . Parathyroid surgery  2008?    left  . Cataract extraction      bilateral     Family History  Problem Relation Age of Onset  . Arthritis Father   . Heart disease Father   . Hypertension Father   . Stroke Father   . Heart attack Father   . Arthritis Mother   . Stroke Mother   . Stroke Brother   . Diabetes Brother   . Hypertension Brother   . Colon cancer Sister   . Hyperlipidemia Sister   . Hypertension Sister   . Hypertension Sister   . Esophageal cancer Neg Hx   . Rectal cancer Neg Hx   . Stomach cancer Neg Hx      Social History   Social History  . Marital Status: Married    Spouse Name: N/A  . Number  of Children: 4  . Years of Education: 12+   Occupational History  . Retired     Research officer, political party    Social History Main Topics  . Smoking status: Never Smoker   . Smokeless tobacco: Never Used  . Alcohol Use: No  . Drug Use: No  . Sexual Activity: Not on file   Other Topics Concern  . 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 HEART:  IRegular rate rhythm, no murmurs, no rubs, no clicks Abd:  soft, positive bowel sounds, no organomegally, no rebound, no guarding Ext:  2 plus pulses, no edema, no  cyanosis, no clubbing Skin:  No rashes no nodules Neuro:  CN II through XII intact, motor grossly intact  EKG - normal sinus rhythm with frequent PVC's.  Assess/Plan:

## 2015-10-18 ENCOUNTER — Encounter: Payer: Self-pay | Admitting: Internal Medicine

## 2015-10-18 ENCOUNTER — Other Ambulatory Visit: Payer: Self-pay | Admitting: *Deleted

## 2015-10-18 ENCOUNTER — Ambulatory Visit (INDEPENDENT_AMBULATORY_CARE_PROVIDER_SITE_OTHER): Payer: Medicare Other | Admitting: Internal Medicine

## 2015-10-18 VITALS — BP 122/80 | HR 76 | Ht 61.75 in | Wt 105.8 lb

## 2015-10-18 DIAGNOSIS — K224 Dyskinesia of esophagus: Secondary | ICD-10-CM

## 2015-10-18 DIAGNOSIS — R1310 Dysphagia, unspecified: Secondary | ICD-10-CM

## 2015-10-18 NOTE — Patient Instructions (Signed)
You have been scheduled for an esophageal manometry at [facility] on Wednesday, 11/17/15 at 1:00 pm. Please arrive 30 minutes prior to your procedure for registration. You will need to go to outpatient registration (1st floor of the hospital) first. Make certain to bring your insurance cards as well as a complete list of medications.  Please remember the following:  1) Nothing to eat after 12:00 midnight on the night before your test. You may have clear liquid until 7 am the day of your test.  2) Hold all diabetic medications/insulin the morning of the test. You may eat and take your medications after the test.  It will take at least 2 weeks to receive the results of this test from your physician. ------------------------------------------ ABOUT ESOPHAGEAL MANOMETRY Esophageal manometry (muh-NOM-uh-tree) is a test that gauges how well your esophagus works. Your esophagus is the long, muscular tube that connects your throat to your stomach. Esophageal manometry measures the rhythmic muscle contractions (peristalsis) that occur in your esophagus when you swallow. Esophageal manometry also measures the coordination and force exerted by the muscles of your esophagus.  During esophageal manometry, a thin, flexible tube (catheter) that contains sensors is passed through your nose, down your esophagus and into your stomach. Esophageal manometry can be helpful in diagnosing some mostly uncommon disorders that affect your esophagus.  Why it's done Esophageal manometry is used to evaluate the movement (motility) of food through the esophagus and into the stomach. The test measures how well the circular bands of muscle (sphincters) at the top and bottom of your esophagus open and close, as well as the pressure, strength and pattern of the wave of esophageal muscle contractions that moves food along.  What you can expect Esophageal manometry is an outpatient procedure done without sedation. Most people  tolerate it well. You may be asked to change into a hospital gown before the test starts.  During esophageal manometry  While you are sitting up, a member of your health care team sprays your throat with a numbing medication or puts numbing gel in your nose or both.  A catheter is guided through your nose into your esophagus. The catheter may be sheathed in a water-filled sleeve. It doesn't interfere with your breathing. However, your eyes may water, and you may gag. You may have a slight nosebleed from irritation.  After the catheter is in place, you may be asked to lie on your back on an exam table, or you may be asked to remain seated.  You then swallow small sips of water. As you do, a computer connected to the catheter records the pressure, strength and pattern of your esophageal muscle contractions.  During the test, you'll be asked to breathe slowly and smoothly, remain as still as possible, and swallow only when you're asked to do so.  A member of your health care team may move the catheter down into your stomach while the catheter continues its measurements.  The catheter then is slowly withdrawn. The test usually lasts 20 to 30 minutes.  After esophageal manometry  When your esophageal manometry is complete, you may return to your normal activities  This test typically takes 30-45 minutes to complete. ________________________________________________________________________________  Dennis Bast may take Zyrtec 10 mg daily as needed.

## 2015-10-18 NOTE — Progress Notes (Signed)
 Subjective:    Patient ID: Michelle Vazquez, female    DOB: 07/17/1929, 81 y.o.   MRN: [id number]  HPI Michelle Vazquez is an 81 yo female with PMH of esophageal dysmotility, Schatzki's ring, parathyroid adenoma status post parathyroidectomy, mitral valve prolapse, PVCs, H0 fibrillation, hyperlipidemia who is seen for follow-up. She was last seen in the office in November 2014. She is referred back by primary care to discuss issues with swallowing and "choking". She reports now she is doing great. She reports in the spring and fall she has issues with cough and clear sinus drainage. With this when she lies down she wakes up coughing and feeling like she is choking. She feels this may be allergy related but primary care reportedly disagreed. Swallowing continues to be an issue particularly for thick consistencies such as pudding or mashed potatoes. She reports foods like salads go down okay. Foods or bulky can give her trouble particularly getting the food into her esophagus. She feels that her swallowing is "out of sync". She denies nausea or vomiting. No odynophagia. No abdominal pain. No weight loss. In fact her weight is 1 pound different from 2 years ago. She had a "stomach flu" 2 weeks ago with nausea and diarrhea but all of this has resolved. She reports bowel movements are regular without blood or melena. She has recently been seen by Dr. Taylor and plans to have a Holter monitor placed soon she's had recent issues with PVCs. She does take flecainide and bisoprolol.  She had an upper endoscopy on 09/01/2013 showed moderate resistance to passage of the upper endoscope across the UES. The mucosa of esophagus was normal. There was a nonobstructing Schatzki's ring at the GE junction. Dilation was performed over a guidewire was every dilator to 15 mm.  At that time there was mild improvement in swallowing but only transient.   Review of Systems As per history of present illness, otherwise  negative  Current Medications, Allergies, Past Medical History, Past Surgical History, Family History and Social History were reviewed in [facility] electronic medical record.     Objective:   Physical Exam BP 122/80 mmHg  Pulse 76  Ht 5' 1.75" (1.568 m)  Wt 105 lb 12.8 oz (47.991 kg)  BMI 19.52 kg/m2 Constitutional: Well-developed and well-nourished. No distress. HEENT: Normocephalic and atraumatic. Conjunctivae are normal.  No scleral icterus. Neck: Neck supple. Trachea midline. Cardiovascular: Normal rate, regular rhythm and intact distal pulses.  Pulmonary/chest: Effort normal and breath sounds normal. No wheezing, rales or rhonchi. Abdominal: Soft, nontender, nondistended. Bowel sounds active throughout.  Extremities: no clubbing, cyanosis, or edema Neurological: Alert and oriented to person place and time. Psychiatric: Normal mood and affect. Behavior is normal.  CBC    Component Value Date/Time   WBC 6.3 08/25/2015 0905   RBC 4.17 08/25/2015 0905   HGB 13.1 08/25/2015 0905   HCT 39.2 08/25/2015 0905   PLT 281.0 08/25/2015 0905   MCV 94.0 08/25/2015 0905   MCHC 33.4 08/25/2015 0905   RDW 13.5 08/25/2015 0905   LYMPHSABS 2.4 08/25/2015 0905   MONOABS 0.5 08/25/2015 0905   EOSABS 0.1 08/25/2015 0905   BASOSABS 0.0 08/25/2015 0905    CMP     Component Value Date/Time   NA 140 08/25/2015 0905   K 4.1 08/25/2015 0905   CL 103 08/25/2015 0905   CO2 29 08/25/2015 0905   GLUCOSE 97 08/25/2015 0905   BUN 19 08/25/2015 0905   CREATININE 0.94 08/25/2015 0905     CALCIUM 9.3 08/25/2015 0905   PROT 7.0 08/25/2015 0905   ALBUMIN 3.9 08/25/2015 0905   AST 18 08/25/2015 0905   ALT 9 08/25/2015 0905   ALKPHOS 42 08/25/2015 0905   BILITOT 0.6 08/25/2015 0905   GFRNONAA >60 08/06/2007 1533   GFRAA  08/06/2007 1533    >60        The eGFR has been calculated using the MDRD equation. This calculation has not been validated in all clinical    Clinical Data:  Difficulty swallowing   ESOPHOGRAM/BARIUM SWALLOW   Technique:  Combined double contrast and single contrast examination performed using effervescent crystals, thick barium liquid, and thin barium liquid.   Fluoroscopy time:  4.4 minutes.   Comparison:  Barium swallow of 04/13/2005   Findings:  A double contrast study shows the mucosa of the esophagus to be unremarkable.  A single contrast study shows no aspiration.  The swallowing mechanism is unremarkable.  Slight prominence of the cricopharyngeus muscle is noted. As noted on prior study the primary esophageal peristaltic wave is diminished, with the esophagus being somewhat a peristaltic.  No hiatal hernia is seen.  No reflux could be demonstrated.  A barium pill was given at the end of the study which lodged within the left piriform sinus and did not pass after repeated swallows.  The patient was given additional liquid and the tablet did dissolve.   IMPRESSION:   1.  Poor primary peristaltic wave as noted on the study of 2006 with mild tertiary contractions. 2.  Slightly prominent cricopharyngeus muscle. 3.  Barium pill lodged in the left piriform sinus.      Assessment & Plan:  81 yo female with PMH of esophageal dysmotility, Schatzki's ring, parathyroid adenoma status post parathyroidectomy, mitral valve prolapse, PVCs, H0 fibrillation, hyperlipidemia who is seen for follow-up.  1. Esophageal dysphagia -- given her prior barium swallow and description of symptoms cricopharyngeal dysfunction may best explain her issues with swallowing. Dilation previously only helped minimally and for a short period. I have recommended esophageal manometry to better understand her esophageal motility and evaluate upper and lower esophageal sphincter pressures. Rule out cricopharyngeal achalasia. Zyrtec 10 mg daily was recommended for those periods when she is having postnasal drip. If she does have hypertensive UES, postnasal drip particular  at night could lead to pooling of secretions and the feeling of choking which would induce coughing. We discussed the risks and benefits as well as alternatives to manometry and she is agreeable to proceed. Follow-up based on these results   25 minutes spent with the patient today. Greater than 50% was spent in counseling and coordination of care with the patient  

## 2015-10-20 DIAGNOSIS — L570 Actinic keratosis: Secondary | ICD-10-CM | POA: Diagnosis not present

## 2015-10-20 DIAGNOSIS — Z85828 Personal history of other malignant neoplasm of skin: Secondary | ICD-10-CM | POA: Diagnosis not present

## 2015-10-20 DIAGNOSIS — D225 Melanocytic nevi of trunk: Secondary | ICD-10-CM | POA: Diagnosis not present

## 2015-10-20 DIAGNOSIS — L821 Other seborrheic keratosis: Secondary | ICD-10-CM | POA: Diagnosis not present

## 2015-10-20 DIAGNOSIS — L820 Inflamed seborrheic keratosis: Secondary | ICD-10-CM | POA: Diagnosis not present

## 2015-10-21 ENCOUNTER — Encounter: Payer: Self-pay | Admitting: *Deleted

## 2015-10-21 ENCOUNTER — Ambulatory Visit (INDEPENDENT_AMBULATORY_CARE_PROVIDER_SITE_OTHER): Payer: Medicare Other

## 2015-10-21 DIAGNOSIS — I493 Ventricular premature depolarization: Secondary | ICD-10-CM | POA: Diagnosis not present

## 2015-10-21 NOTE — Progress Notes (Signed)
Patient ID: Michelle Vazquez, female   DOB: 1929/06/09, 81 y.o.   MRN: [id number] Patient did not show up for 10/21/2015 10:00 am appointment to have a 24 hour holter monitor applied.

## 2015-10-21 NOTE — Progress Notes (Signed)
Patient ID: Michelle Vazquez, female   DOB: October 11, 1929, 81 y.o.   MRN: [id number] Patient showed up for 10:00 AM holter monitor appointment at 11:00.   Holter monitor applied.

## 2015-10-22 ENCOUNTER — Ambulatory Visit: Payer: Medicare Other | Admitting: Internal Medicine

## 2015-11-03 ENCOUNTER — Telehealth: Payer: Self-pay | Admitting: Internal Medicine

## 2015-11-03 NOTE — Telephone Encounter (Signed)
°  New Prob   Pt is calling following up on monitor results. Please call.

## 2015-11-04 NOTE — Telephone Encounter (Signed)
Spoke with patient and let her know that her monitor showed she had 2,100 PVC's in a 24 hour period.  I let her know I would discuss with Dr Lovena Le next Thurs upon his return and let her know if he recommends any changes in medications

## 2015-11-11 NOTE — Telephone Encounter (Signed)
Discussed with Dr Lovena Le and and he will not make any changes in her medications at present.  i have tried to call patient today but her number has been busy all day

## 2015-11-12 NOTE — Telephone Encounter (Signed)
Spoke with patient and she is aware no change for now

## 2015-11-17 ENCOUNTER — Ambulatory Visit (HOSPITAL_COMMUNITY)
Admission: RE | Admit: 2015-11-17 | Discharge: 2015-11-17 | Disposition: A | Discharge status: Home / Self Care | Payer: Medicare Other | Source: Ambulatory Visit | Attending: Internal Medicine | Admitting: Internal Medicine

## 2015-11-17 ENCOUNTER — Encounter (HOSPITAL_COMMUNITY)
Admission: RE | Disposition: A | Discharge status: Home / Self Care | Payer: Self-pay | Source: Ambulatory Visit | Attending: Internal Medicine

## 2015-11-17 DIAGNOSIS — R1310 Dysphagia, unspecified: Secondary | ICD-10-CM | POA: Diagnosis not present

## 2015-11-17 HISTORY — PX: ESOPHAGEAL MANOMETRY: SHX5429

## 2015-11-17 SURGERY — MANOMETRY, ESOPHAGUS

## 2015-11-17 MED ORDER — LIDOCAINE VISCOUS 2 % MT SOLN
OROMUCOSAL | Status: AC
Start: 2015-11-17 — End: 2015-11-17
  Filled 2015-11-17: qty 15

## 2015-11-17 SURGICAL SUPPLY — 2 items
FACESHIELD LNG OPTICON STERILE (SAFETY) IMPLANT
GLOVE BIO SURGEON STRL SZ8 (GLOVE) ×4 IMPLANT

## 2015-11-18 ENCOUNTER — Encounter (HOSPITAL_COMMUNITY): Payer: Self-pay | Admitting: Internal Medicine

## 2015-11-25 ENCOUNTER — Telehealth: Payer: Self-pay | Admitting: *Deleted

## 2015-11-25 NOTE — Telephone Encounter (Signed)
-----   Message from Jerene Bears, MD sent at 11/25/2015 11:20 AM EST ----- Regarding: Mano results Please communicate results to the patient:  Manometry results -- study performed for dysphagia Patient has hypercontractile esophagus -- basically esophageal spasm which causes trouble with food transfer to the stomach Distal esophageal sphincter muscle also fails to relax normally All of these worsened dysphagia This has been chronic for her Medications may help Please schedule an office visit next available

## 2015-11-25 NOTE — Telephone Encounter (Signed)
I have spoken to patient and have advised of results as per Dr Hilarie Fredrickson. Patient verbalizes understanding and will come to see Dr Hilarie Fredrickson in follow up on 12/09/15 @ 4:00 pm.

## 2015-11-29 ENCOUNTER — Encounter: Payer: Self-pay | Admitting: *Deleted

## 2015-12-09 ENCOUNTER — Encounter: Payer: Self-pay | Admitting: Internal Medicine

## 2015-12-09 ENCOUNTER — Ambulatory Visit (INDEPENDENT_AMBULATORY_CARE_PROVIDER_SITE_OTHER): Payer: Medicare Other | Admitting: Internal Medicine

## 2015-12-09 VITALS — BP 114/58 | HR 68 | Ht 61.22 in | Wt 107.4 lb

## 2015-12-09 DIAGNOSIS — K229 Disease of esophagus, unspecified: Secondary | ICD-10-CM

## 2015-12-09 DIAGNOSIS — K224 Dyskinesia of esophagus: Secondary | ICD-10-CM

## 2015-12-09 NOTE — Patient Instructions (Signed)
We will contact Dr Darnell Level. Lovena Le about your medication changes.   You can use peppermint Altoids over the counter to help with the esophageal spasms. Use 2 by mouth as needed.  We have given you a copy of your manometry for your records.

## 2015-12-09 NOTE — Progress Notes (Signed)
   Subjective:    Patient ID: Michelle Vazquez, female    DOB: Nov 09, 1928, 82 y.o.   MRN: [id number]  HPI Michelle Vazquez is an 82 year old female with history of esophageal dysmotility, mitral valve prolapse, PVCs, history of parathyroid adenoma status post parathyroidectomy, hyperlipidemia who is here for follow-up. I saw her last in the office in December 2016 to again discuss her esophageal dysphagia, dysmotility and choking sensation. Her issues with swallowing seem to be worse at night but have been stable over time. She did not benefit from previous attempted esophageal dilatation. She's had no weight loss. She continues to state the symptoms are "livable" but she is interested in medication if possible to improve and he swallowing.  The decision was made to pursue esophageal manometry which was performed on 11/17/2015. 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 Residual LES pressures were also elevated slightly at 26.1 mmHg but there was no evidence of achalasia.  She continues on flecainide and bisoprolol for her history of PVCs under the direction of Dr. Lovena Le. Since increasing bisoprolol to daily from 3 days a week she has noticed some dizziness with change in position particularly if rising from lying down quickly. No falls.  Review of Systems As per history of present illness, otherwise negative  Current Medications, Allergies, Past Medical History, Past Surgical History, Family History and Social History were reviewed in [employer] record.     Objective:   Physical Exam BP 114/58 mmHg  Pulse 68  Ht 5' 1.22" (1.555 m)  Wt 107 lb 6 oz (48.705 kg)  BMI 20.14 kg/m2 Constitutional: Well-developed and well-nourished. No distress. HEENT: Normocephalic and atraumatic. No scleral icterus. Neck: Neck supple. Trachea midline. Cardiovascular: Normal rate, regular rhythm and intact distal pulses.    Pulmonary/chest: Effort normal and breath sounds normal.  Abdominal: Soft, nontender, nondistended. Bowel sounds active throughout.  Extremities: no clubbing, cyanosis, or edema Neurological: Alert and oriented to person place and time. Skin: Skin is warm and dry.  Psychiatric: Normal mood and affect. Behavior is normal.     Assessment & Plan:  82 year old female with history of esophageal dysmotility, mitral valve prolapse, PVCs, history of parathyroid adenoma status post parathyroidectomy, hyperlipidemia who is here for follow-up.  1. Hypercontractile esophagus/jackhammer esophagus -- high-resolution esophageal manometry was very informative as to her esophageal dysmotility disorder. We have discussed jackhammer esophagus today and how this results in issues with esophageal dysmotility. Fortunately she does not have much atypical chest pain with her esophageal hypercontractility. Given this issue it is not surprising that she did not respond to esophageal dilatation. --Patient's with hypercontractility of the esophagus can benefit greatly from calcium channel blockers. I recommended a trial of diltiazem 30-60 mg 3 times daily. Prior to starting this I will communicate with Dr. Lovena Le her cardiologist. It may be best to stop bisoprolol in favor of diltiazem but would like his opinion first. --I also recommended peppermint altoids which contain pure peppermint oil. Peppermint oil has been proven to call smooth muscle relaxation in the esophagus and can help spastic dysmotility. --She was given a copy of her esophageal manometry report today to discuss with her 3 daughters who are involved in her health care  25 minutes spent with the patient today. Greater than 50% was spent in counseling and coordination of care with the patient Follow-up in approximately 3 months.

## 2015-12-13 ENCOUNTER — Telehealth: Payer: Self-pay | Admitting: *Deleted

## 2015-12-13 MED ORDER — DILTIAZEM HCL 30 MG PO TABS: 30.0000 mg | ORAL_TABLET | Freq: Three times a day (TID) | ORAL | Status: DC

## 2015-12-13 NOTE — Telephone Encounter (Signed)
I have spoken to Ms. Alcock and have given Dr Vena Rua and Dr Tanna Furry recommendations. She verbalizes understanding to discontinue bisoprolol and instead begin diltiazem 30 mg three times daily. She also verbalizes understanding to contact us in 2-4 weeks to update [place] on her condition. Rx sent to pharmacy for diltiazem.

## 2015-12-13 NOTE — Telephone Encounter (Signed)
-----   Message from Jerene Bears, MD sent at 12/13/2015 12:04 PM EST ----- Please notify Mrs. Montesinos of my contact and discussion with her cardiologist, Dr. Lovena Le. She will stop bisoprolol and start diltiazem 30 mg three times per day. Reassure her that Dr. Lovena Le was involved and approves of the switch and the dosing. Copy my communication with Dr. Lovena Le to the start for documentation of our discussion. This dose may need to be up titrated depending on response. Ask her to call with update after 2-4 weeks. JMP  ----- Message -----    From: Evans Lance, MD    Sent: 12/13/2015   8:43 AM      To: Jerene Bears, MD  Ulice Dash, that substitution would be just fine. I concur with your dosing. Might consider starting on low side and then uptitrating as BP and HR tolerate. GT ----- Message -----    From: Jerene Bears, MD    Sent: 12/09/2015   5:12 PM      To: Evans Lance, MD  Leroy Kennedy see Mrs. Seedorf for esophageal dysmotility. Recent manometry revealed esophageal hypercontractility which I think is causing her dysphagia issues. I would like to try her on diltiazem but would like to involve you in this decision. She is on flecainide and bisoprolol currently. Would you be okay if we substitute diltiazem  for bisoprolol? I was thinking 30-60 mg 3 times daily. Thanks so much for your input   [facility]

## 2015-12-20 ENCOUNTER — Telehealth: Payer: Self-pay | Admitting: Internal Medicine

## 2015-12-20 NOTE — Telephone Encounter (Signed)
This could be an element of rebound tachycardia and hypertension given the discontinuation of beta blocker I'm going to involve Dr. Lovena Le for his opinion Michelle Vazquez, we started diltiazem for esophageal spasm bisoprolol. Should I have her taper bisoprolol or increased diltiazem? Thanks for your help

## 2015-12-20 NOTE — Telephone Encounter (Signed)
Pt states the diltiazem has been causing her BP and heart rate to go up. States when she came in the other day her BP was 114/58 p-68. This morning her BP was 152/89 and P-114, this afternoon BP 152/85 and P-94. Pt is concerned and wants to know if she can just go back to the med she was on prior to the diltiazem. Please advise.

## 2015-12-28 NOTE — Telephone Encounter (Signed)
I have spoken to patient to give both Dr Hilarie Fredrickson and Dr Tanna Furry recommendation to increase diltiazem to 60 mg three times daily as she could be having some rebound symptoms from stopping the bisoprolol. Although I reiterated multiple times that both Dr Lovena Le and Dr Hilarie Fredrickson were agreeable with plan and explained what rebound symptoms were, patient was adamant that she did not think she should take the diltiazem because the lower dose of diltiazem caused her blood pressure and heart rate to increase. She states that after talking to her pharmacist, she decieded to go back on her bisoprolol. Therefore, she has stopped the diltiazem and has restarted her bisoprolol. Dr Hilarie Fredrickson, it may be beneficial for you to contact patient if you feel strongly that she needs this medication as I do not feel my conversation with her was favorable.

## 2015-12-28 NOTE — Telephone Encounter (Signed)
Left message with female for patient to call back. 

## 2015-12-28 NOTE — Telephone Encounter (Signed)
Michelle Vazquez Dr. Tanna Furry recommendations. He recommended increasing diltiazem dose and he would expect BP to improve after 2-3 days of this therapy Would rec Diltiazem 60 mg TID She how she is feeling

## 2015-12-30 NOTE — Telephone Encounter (Signed)
I have attempted to contact patient but I get no dial tone at all when calling (after multiple tries). I will attempt to call her back at a later time.

## 2015-12-30 NOTE — Telephone Encounter (Signed)
Ok, she had previously said she was fine "living with" her symptoms of hypertensive esophageal contractions Please schedule OV for [place] to discuss further and be sure she remains okay with overall plans

## 2015-12-31 NOTE — Telephone Encounter (Signed)
I have spoken to patient and she has scheduled an appointment with Dr Hilarie Fredrickson for 01/24/16 @ 4:00 pm.

## 2016-01-06 DIAGNOSIS — L814 Other melanin hyperpigmentation: Secondary | ICD-10-CM | POA: Diagnosis not present

## 2016-01-06 DIAGNOSIS — D225 Melanocytic nevi of trunk: Secondary | ICD-10-CM | POA: Diagnosis not present

## 2016-01-06 DIAGNOSIS — D2271 Melanocytic nevi of right lower limb, including hip: Secondary | ICD-10-CM | POA: Diagnosis not present

## 2016-01-06 DIAGNOSIS — D2272 Melanocytic nevi of left lower limb, including hip: Secondary | ICD-10-CM | POA: Diagnosis not present

## 2016-01-06 DIAGNOSIS — Z85828 Personal history of other malignant neoplasm of skin: Secondary | ICD-10-CM | POA: Diagnosis not present

## 2016-01-06 DIAGNOSIS — L821 Other seborrheic keratosis: Secondary | ICD-10-CM | POA: Diagnosis not present

## 2016-01-06 DIAGNOSIS — D1801 Hemangioma of skin and subcutaneous tissue: Secondary | ICD-10-CM | POA: Diagnosis not present

## 2016-01-06 DIAGNOSIS — D2262 Melanocytic nevi of left upper limb, including shoulder: Secondary | ICD-10-CM | POA: Diagnosis not present

## 2016-01-24 ENCOUNTER — Ambulatory Visit (INDEPENDENT_AMBULATORY_CARE_PROVIDER_SITE_OTHER): Payer: Medicare Other | Admitting: Internal Medicine

## 2016-01-24 ENCOUNTER — Encounter: Payer: Self-pay | Admitting: Internal Medicine

## 2016-01-24 VITALS — BP 120/70 | HR 66 | Ht 61.22 in | Wt 108.0 lb

## 2016-01-24 DIAGNOSIS — K224 Dyskinesia of esophagus: Secondary | ICD-10-CM

## 2016-01-24 DIAGNOSIS — K229 Disease of esophagus, unspecified: Secondary | ICD-10-CM

## 2016-01-24 NOTE — Patient Instructions (Signed)
Follow up with Dr Hilarie Fredrickson as needed.  Please let us know if your esophageal spasm symptoms worsen.

## 2016-01-24 NOTE — Progress Notes (Signed)
   Subjective:    Patient ID: Michelle Vazquez, female    DOB: 02-28-1929, 82 y.o.   MRN: [id number]  HPI Michelle Vazquez is an 82 year old female with history of esophageal dysmotility/jackhammer esophagus, mitral valve prolapse, PVCs, history of parathyroid adenoma status post parathyroidectomy and hyperlipidemia is here for follow-up. She was last seen in the office on 12/09/2015. After her last visit I placed her on diltiazem after discussing this with her cardiologist Dr. Lovena Le. We were going to try diltiazem to try to help her esophageal dysphagia felt secondary to esophageal dysmotility and hypercontractile esophagus. She stopped her bisoprolol with plans to begin diltiazem 30 mg 4 times daily. Several days after stopping bisoprolol she developed increased blood pressure and heart rate. She reports that she felt "giddy" and just "didn't feel right". She became concerned and also noticed that the blood vessels in her lower extremity seemed enlarged. She talked with pharmacy and made the decision to stop the diltiazem and resume bisoprolol at 2.5 mg daily. About 24-36 hours after changing back to bisoprolol she felt normal with normal heart rate and blood pressure.  She continues to have issues with swallowing. This continues to be "livable" and her weight has been stable. She denies chest pain. She reports having to eat very slowly and at times reports food simply just doesn't want to go down. She brought in a copy of her esophageal manometry which we reviewed together.  Review of Systems As per history of present illness, otherwise negative  Current Medications, Allergies, Past Medical History, Past Surgical History, Family History and Social History were reviewed in [employer] record.      Objective:   Physical Exam BP 120/70 mmHg  Pulse 66  Ht 5' 1.22" (1.555 m)  Wt 108 lb (48.988 kg)  BMI 20.26 kg/m2 Constitutional: Well-developed and well-nourished. No  distress. HEENT: Normocephalic and atraumatic.  Conjunctivae are normal.  No scleral icterus. Psychiatric: Normal mood and affect. Behavior is normal.      Assessment & Plan:  82 year old female with history of esophageal dysmotility/jackhammer esophagus, mitral valve prolapse, PVCs, history of parathyroid adenoma status post parathyroidectomy and hyperlipidemia is here for follow-up.  1. Jackhammer esophagus/esophageal hypercontractility with dysphagia symptom -- unfortunately she did not tolerate switching bisoprolol to diltiazem. The symptoms she described is likely related to beta blocker withdrawal and rebound hypertension/tachycardia. My plan had been increased diltiazem to 60 mg after further discussing with Dr. Lovena Le but the patient preferred to go back to bisoprolol. She is not interested in trying diltiazem again because her symptoms are stable. I did discuss possible low-dose isosorbide but she would prefer to "leave things alone for now". She is willing to try peppermint oil in the form of Altoids. She is instructed to use 2 of these chewable or dissolvable tablets before meals. We also briefly discussed the addition of ranitidine twice a day to see if reflux is worsening esophageal spasm symptoms but she prefers to not do this at present. She is not having heartburn. She prefers to follow-up as needed and let me know if symptoms worsen.  15 minutes spent with the patient today. Greater than 50% was spent in counseling and coordination of care with the patient

## 2016-04-05 ENCOUNTER — Telehealth: Payer: Self-pay | Admitting: Internal Medicine

## 2016-04-05 ENCOUNTER — Encounter: Payer: Self-pay | Admitting: Internal Medicine

## 2016-04-05 ENCOUNTER — Ambulatory Visit (INDEPENDENT_AMBULATORY_CARE_PROVIDER_SITE_OTHER): Payer: Medicare Other | Admitting: Internal Medicine

## 2016-04-05 VITALS — BP 122/70 | HR 74 | Temp 98.4°F | Resp 20 | Wt 106.0 lb

## 2016-04-05 DIAGNOSIS — L089 Local infection of the skin and subcutaneous tissue, unspecified: Secondary | ICD-10-CM | POA: Diagnosis not present

## 2016-04-05 DIAGNOSIS — T148 Other injury of unspecified body region: Secondary | ICD-10-CM

## 2016-04-05 DIAGNOSIS — W57XXXA Bitten or stung by nonvenomous insect and other nonvenomous arthropods, initial encounter: Secondary | ICD-10-CM | POA: Diagnosis not present

## 2016-04-05 MED ORDER — DOXYCYCLINE HYCLATE 100 MG PO TABS: 100.0000 mg | ORAL_TABLET | Freq: Two times a day (BID) | ORAL | Status: DC

## 2016-04-05 NOTE — Assessment & Plan Note (Signed)
Mild to mod, for antibx course,  to f/u any worsening symptoms or concerns 

## 2016-04-05 NOTE — Progress Notes (Signed)
Subjective:    Patient ID: Michelle Vazquez, female    DOB: 1929/02/15, 82 y.o.   MRN: [id number]  HPI  Here with 10 days onset red/tender/swelling area to right mid lateral upper leg, without fever, other rash, red streaks, fluctuance or drainage.  + tick found at the onset first day, but site getting worse, not better.  No hx of tick borne dz or tx in the past.  Pt denies chest pain, increased sob or doe, wheezing, orthopnea, PND, increased LE swelling, palpitations, dizziness or syncope.   Past Medical History  Diagnosis Date  . Mitral valve prolapse   . Parathyroid adenoma     post parathyroid surgery in October of 2008  . Premature ventricular contractions (PVCs) (VPCs)   . Paroxysmal supraventricular tachycardia ([place])   . Arthritis   . Urinary incontinence   . Atrial fibrillation ([place])   . HLD (hyperlipidemia)   . Bowel obstruction ([place]) 2000  . Squamous cell carcinoma in situ of skin     face  . Cataract     bilateral removed  . Osteoporosis     declines rx and declines DEXA f/u  . Schatzki's ring 08/2013    Dr. Hilarie Fredrickson, s/p dilation 08/2013, ?benefit  . Hypercontractile esophagus    Past Surgical History  Procedure Laterality Date  . Shoulder surgery  02/1999    right  . Skin lesion excision  08/1999    left side of face  . Laparoscopic small bowel resection  2000  . Breast biopsy  1979    left  . Abdominal hysterectomy  1989  . Parathyroid surgery  2008?    left  . Cataract extraction      bilateral  . Esophageal manometry N/A 11/17/2015    Procedure: ESOPHAGEAL MANOMETRY (EM);  Surgeon: Jerene Bears, MD;  Location: [facility];  Service: Gastroenterology;  Laterality: N/A;    reports that she has never smoked. She has never used smokeless tobacco. She reports that she does not drink alcohol or use illicit drugs. family history includes Arthritis in her father and mother; Colon cancer in her sister; Diabetes in her brother; Heart attack in her father; Heart disease  in her father; Hyperlipidemia in her sister; Hypertension in her brother, father, sister, and sister; Stroke in her brother, father, and mother. There is no history of Esophageal cancer, Rectal cancer, or Stomach cancer. Allergies  Allergen Reactions  . Diltiazem Hcl Other (See Comments)    Rapid heartbeat and elevated blood pressure  . Inderal [Propranolol Hcl]     Pt does not remember reaction  . Lopressor [Metoprolol Tartrate]     Per the pt' "I felt like I was going to pass out"  . Penicillins Diarrhea   Current Outpatient Prescriptions on File Prior to Visit  Medication Sig Dispense Refill  . bisoprolol (ZEBETA) 5 MG tablet Take 1/2 tablet (2.5 mg) by mouth once daily 45 tablet 3  . calcium gluconate 500 MG tablet Take 500 mg by mouth daily.      . flecainide (TAMBOCOR) 50 MG tablet TAKE ONE TABLET BY MOUTH TWICE DAILY. 180 tablet 3  . Multiple Vitamin (MULTIVITAMIN WITH MINERALS) TABS tablet Take 1 tablet by mouth daily.     No current facility-administered medications on file prior to visit.   Review of Systems All otherwise neg per pt     Objective:   Physical Exam BP 122/70 mmHg  Pulse 74  Temp(Src) 98.4 F (36.9 C) (Oral)  Resp  20  Wt 106 lb (48.081 kg)  SpO2 96% VS noted, not ill appaering Constitutional: Pt appears in no apparent distress HENT: Head: NCAT.  Right Ear: External ear normal.  Left Ear: External ear normal.  Eyes: . Pupils are equal, round, and reactive to light. Conjunctivae and EOM are normal Neck: Normal range of motion. Neck supple.  Cardiovascular: Normal rate and regular rhythm.   Pulmonary/Chest: Effort normal and breath sounds without rales or wheezing.  Neurological: Pt is alert. Not confused , motor grossly intact Skin: Skin is warm. + 2 cm area right mid lateral thigh red/tender/swelling, with no LE edema Psychiatric: Pt behavior is normal. No agitation.     Assessment & Plan:

## 2016-04-05 NOTE — Telephone Encounter (Signed)
Patient Name: Michelle Vazquez DOB: [birth year] Initial Comment caller states she has a tick bite that's red Nurse Assessment Nurse: Vallery Sa, RN, Cathy Date/Time (Eastern Time): 04/05/2016 9:15:30 AM Confirm and document reason for call. If symptomatic, describe symptoms. You must click the next button to save text entered. ---Romie Minus states she removed a tick from her thigh last week that became red about a week ago. No fever. Alert and responsive. Has the patient traveled out of the country within the last 30 days? ---No Does the patient have any new or worsening symptoms? ---Yes Will a triage be completed? ---Yes Related visit to physician within the last 2 weeks? ---No Does the PT have any chronic conditions? (i.e. diabetes, asthma, etc.) ---Yes List chronic conditions. ---Irregular heart beats, Swallowing problems Is this a behavioral health or substance abuse call? ---No Guidelines Guideline Title Affirmed Question Affirmed Notes Tick Bite [1] Red or very tender (to touch) area AND [2] started over 24 hours after the bite Final Disposition User See Physician within [place], RN, [place] Comments Scheduled for 6:15pm appointment today with Dr. Cathlean Cower. Referrals REFERRED TO PCP OFFICE Disagree/Comply: Comply

## 2016-04-05 NOTE — Patient Instructions (Signed)
Please take all new medication as prescribed - the antibiotic  Please continue all other medications as before, and refills have been done if requested.  Please have the pharmacy call with any other refills you may need..  Please keep your appointments with your specialists as you may have planned  Please return in 6 months, or sooner if needed

## 2016-04-05 NOTE — Progress Notes (Signed)
Pre visit review using our clinic review tool, if applicable. No additional management support is needed unless otherwise documented below in the visit note. 

## 2016-05-10 DIAGNOSIS — S52502A Unspecified fracture of the lower end of left radius, initial encounter for closed fracture: Secondary | ICD-10-CM | POA: Diagnosis not present

## 2016-05-17 DIAGNOSIS — S52502D Unspecified fracture of the lower end of left radius, subsequent encounter for closed fracture with routine healing: Secondary | ICD-10-CM | POA: Diagnosis not present

## 2016-05-24 DIAGNOSIS — S52502D Unspecified fracture of the lower end of left radius, subsequent encounter for closed fracture with routine healing: Secondary | ICD-10-CM | POA: Diagnosis not present

## 2016-06-21 DIAGNOSIS — S52502D Unspecified fracture of the lower end of left radius, subsequent encounter for closed fracture with routine healing: Secondary | ICD-10-CM | POA: Diagnosis not present

## 2016-07-19 DIAGNOSIS — S52502D Unspecified fracture of the lower end of left radius, subsequent encounter for closed fracture with routine healing: Secondary | ICD-10-CM | POA: Diagnosis not present

## 2016-08-01 DIAGNOSIS — H35372 Puckering of macula, left eye: Secondary | ICD-10-CM | POA: Diagnosis not present

## 2016-08-01 DIAGNOSIS — H26491 Other secondary cataract, right eye: Secondary | ICD-10-CM | POA: Diagnosis not present

## 2016-08-01 DIAGNOSIS — H1851 Endothelial corneal dystrophy: Secondary | ICD-10-CM | POA: Diagnosis not present

## 2016-08-01 DIAGNOSIS — D3131 Benign neoplasm of right choroid: Secondary | ICD-10-CM | POA: Diagnosis not present

## 2016-08-27 NOTE — Progress Notes (Signed)
Subjective:    Patient ID: Michelle Vazquez, female    DOB: 1929-07-27, 82 y.o.   MRN: [id number]  HPI Here for medicare wellness exam and follow up visit.   I have personally reviewed and have noted 1.The patient's medical and social history 2.Their use of alcohol, tobacco or illicit drugs 3.Their current medications and supplements 4.The patient's functional ability including ADL's, fall risks, home safety risks and hearing or visual impairment. 5.Diet and physical activities 6.Evidence for depression or mood disorders 7.Care team reviewed and updated (available in snapshot)  Lives with husband who is 53. Are there smokers in your home (other than you)? No  Risk Factors Exercise:  Does yard work and house work Dietary issues discussed: eats a lot of sweets, eats food amount of fruits and veges, eats little meat.  Does not eat yogurt, eats some eggs and cheese.   Cardiac risk factors: advanced age, hypertension, hyperlipidemia  Depression Screen  Have you felt down, depressed or hopeless? No  Have you felt little interest or pleasure in doing things?  No  Activities of Daily Living In your present state of health, do you have any difficulty performing the following activities?:  Driving? No Managing money?  No Feeding yourself? No Getting from bed to chair? No Climbing a flight of stairs? No Preparing food and eating?: No Bathing or showering? No Getting dressed: No Getting to/using the toilet? No Moving around from place to place: No In the past year have you fallen or had a near fall?: yes, July 2017 -broke wrist   Are you sexually active?  No  Do you have more than one partner?  N/A  Hearing Difficulties: No Do you often ask people to speak up or repeat themselves? No Do you experience ringing or noises in your ears? No Do you have difficulty understanding soft or whispered voices? No Vision:          Any change in vision:  no             Up to date with eye exam:  yes Memory:  Do you feel that you have a problem with memory? No  Do you often misplace items? No  Do you feel safe at home?  Yes  Cognitive Testing  Alert, Orientated? Yes  Normal Appearance? Yes  Recall of three objects?  Yes  Can perform simple calculations? Yes  Displays appropriate judgment? Yes  Can read the correct time from a watch face? Yes   Advanced Directives have been discussed with the patient? Yes   Medications and allergies reviewed with patient and updated if appropriate.  Patient Active Problem List   Diagnosis Date Noted  . Family history of diabetes mellitus (DM) 08/28/2016  . Tick bite, infected 04/05/2016  . Osteoporosis   . Dysphagia 08/28/2013  . Arthritis   . HLD (hyperlipidemia)   . 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 Mitral valve prolapse 06/21/2011  . Parathyroid adenoma 06/21/2011    Current Outpatient Prescriptions on File Prior to Visit  Medication Sig Dispense Refill  . bisoprolol (ZEBETA) 5 MG tablet Take 1/2 tablet (2.5 mg) by mouth once daily 45 tablet 3  . calcium gluconate 500 MG tablet Take 500 mg by mouth daily.      . flecainide (TAMBOCOR) 50 MG tablet TAKE ONE TABLET BY MOUTH TWICE DAILY. 180 tablet 3  . Multiple Vitamin (MULTIVITAMIN WITH MINERALS) TABS tablet Take 1 tablet by mouth daily.     No current facility-administered  medications on file prior to visit.     Past Medical History:  Diagnosis Date  . Arthritis   . Atrial fibrillation ([place])   . Bowel obstruction 2000  . Cataract    bilateral removed  . HLD (hyperlipidemia)   . Hypercontractile esophagus   . Mitral valve prolapse   . Osteoporosis    declines rx and declines DEXA f/u  . Parathyroid adenoma    post parathyroid surgery in October of 2008  . Paroxysmal supraventricular tachycardia ([place])   . Premature ventricular contractions (PVCs) (VPCs)   . Schatzki's ring 08/2013   Dr. Hilarie Fredrickson, s/p  dilation 08/2013, ?benefit  . Squamous cell carcinoma in situ of skin    face  . Urinary incontinence     Past Surgical History:  Procedure Laterality Date  . ABDOMINAL HYSTERECTOMY  1989  . BREAST BIOPSY  1979   left  . CATARACT EXTRACTION     bilateral  . ESOPHAGEAL MANOMETRY N/A 11/17/2015   Procedure: ESOPHAGEAL MANOMETRY (EM);  Surgeon: Jerene Bears, MD;  Location: [facility];  Service: Gastroenterology;  Laterality: N/A;  . LAPAROSCOPIC SMALL BOWEL RESECTION  2000  . parathyroid surgery  2008?   left  . SHOULDER SURGERY  02/1999   right  . SKIN LESION EXCISION  08/1999   left side of face    Social History   Social History  . Marital status: Married    Spouse name: N/A  . Number of children: 4  . Years of education: 12+   Occupational History  . Retired     Research officer, political party    Social History Main Topics  . Smoking status: Never Smoker  . Smokeless tobacco: Never Used  . Alcohol use No  . Drug use: No  . Sexual activity: Not Asked   Other Topics Concern  . None   Social History Narrative   Regular exercise-sometimes   Caffeine Use-no   married x 2, caregiver of 40yo husband (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) - retired Adult nurse    Family History  Problem Relation Age of Onset  . Arthritis Father   . Heart disease Father   . Hypertension Father   . Stroke Father   . Heart attack Father   . Arthritis Mother   . Stroke Mother   . Stroke Brother   . Diabetes Brother   . Hypertension Brother   . Colon cancer Sister     in her 107's  . Hyperlipidemia Sister   . Hypertension Sister   . Hypertension Sister   . Diabetes Sister   . Esophageal cancer Neg Hx   . Rectal cancer Neg Hx   . Stomach cancer Neg Hx     Review of Systems  Constitutional: Negative for appetite change, chills, fever and unexpected weight change.  HENT: Positive for rhinorrhea and trouble swallowing (daily). Negative for hearing loss and tinnitus.   Eyes: Negative for visual  disturbance.  Respiratory: Positive for cough (from allergies). Negative for shortness of breath and wheezing.   Cardiovascular: Negative for chest pain, palpitations and leg swelling.  Gastrointestinal: Positive for constipation (mild, chronic). Negative for abdominal pain, blood in stool, diarrhea and nausea.       No gerd  Endocrine: Positive for cold intolerance.  Genitourinary: Negative for dysuria and hematuria.       Some incontinence - stress and urgency, nocturia x 2  Musculoskeletal: Positive for arthralgias (hands). Negative for back pain.  Neurological: Negative for dizziness, light-headedness and headaches.  Psychiatric/Behavioral: Negative for dysphoric mood. The patient is not nervous/anxious.        Objective:   Vitals:   08/28/16 1029  BP: 136/80  Pulse: 66  Resp: 16  Temp: 97.8 F (36.6 C)   Filed Weights   08/28/16 1029  Weight: 105 lb (47.6 kg)   Body mass index is 19.84 kg/m.   Physical Exam Constitutional: She appears well-developed and well-nourished. No distress.  HENT:  Head: Normocephalic and atraumatic.  Right Ear: External ear normal. Normal ear canal and TM Left Ear: External ear normal.  Normal ear canal and TM Mouth/Throat: Oropharynx is clear and moist.  Eyes: Conjunctivae and EOM are normal.  Neck: Neck supple. No tracheal deviation present. No thyromegaly present.  No carotid bruit  Cardiovascular: Normal rate, regular rhythm and normal heart sounds.   No murmur heard.  No edema. Pulmonary/Chest: Effort normal and breath sounds normal. No respiratory distress. She has no wheezes. She has no rales.  Abdominal: Soft. She exhibits no distension. There is no tenderness.  Lymphadenopathy: She has no cervical adenopathy.  Skin: Skin is warm and dry. She is not diaphoretic.  Psychiatric: She has a normal mood and affect. Her behavior is normal.       Assessment & Plan:   Wellness Exam: Immunizations  - flu vaccine today, other vaccines  up to date Colonoscopy - no longer needed at her age 34  - no longer needed at her age Dexa - Deferred-she does not wish to have any further testing Eye exam  Up to date  Hearing loss  none Memory concerns/difficulties  - none Independent of ADLs -- yes, fully  Stressed the importance of regular exercise   Patient received copy of preventative screening tests/immunizations recommended for the next 5-10 years.   See Problem List for Assessment and Plan of chronic medical problems.

## 2016-08-28 ENCOUNTER — Other Ambulatory Visit (INDEPENDENT_AMBULATORY_CARE_PROVIDER_SITE_OTHER): Payer: Medicare Other

## 2016-08-28 ENCOUNTER — Encounter: Payer: Self-pay | Admitting: Internal Medicine

## 2016-08-28 ENCOUNTER — Ambulatory Visit (INDEPENDENT_AMBULATORY_CARE_PROVIDER_SITE_OTHER): Payer: Medicare Other | Admitting: Internal Medicine

## 2016-08-28 VITALS — BP 136/80 | HR 66 | Temp 97.8°F | Resp 16 | Ht 61.0 in | Wt 105.0 lb

## 2016-08-28 DIAGNOSIS — D351 Benign neoplasm of parathyroid gland: Secondary | ICD-10-CM

## 2016-08-28 DIAGNOSIS — Z833 Family history of diabetes mellitus: Secondary | ICD-10-CM

## 2016-08-28 DIAGNOSIS — M810 Age-related osteoporosis without current pathological fracture: Secondary | ICD-10-CM

## 2016-08-28 DIAGNOSIS — E785 Hyperlipidemia, unspecified: Secondary | ICD-10-CM

## 2016-08-28 DIAGNOSIS — Z0000 Encounter for general adult medical examination without abnormal findings: Secondary | ICD-10-CM

## 2016-08-28 DIAGNOSIS — Z23 Encounter for immunization: Secondary | ICD-10-CM | POA: Diagnosis not present

## 2016-08-28 DIAGNOSIS — R7301 Impaired fasting glucose: Secondary | ICD-10-CM | POA: Insufficient documentation

## 2016-08-28 DIAGNOSIS — I493 Ventricular premature depolarization: Secondary | ICD-10-CM

## 2016-08-28 DIAGNOSIS — R1319 Other dysphagia: Secondary | ICD-10-CM | POA: Diagnosis not present

## 2016-08-28 DIAGNOSIS — R739 Hyperglycemia, unspecified: Secondary | ICD-10-CM | POA: Insufficient documentation

## 2016-08-28 LAB — LIPID PANEL
CHOLESTEROL: 221 mg/dL — AB (ref 0–200)
HDL: 77.2 mg/dL (ref >39.00)
LDL Cholesterol: 127 mg/dL — ABNORMAL HIGH (ref 0–99)
NONHDL: 143.58
Total CHOL/HDL Ratio: 3
Triglycerides: 85 mg/dL (ref 0.0–149.0)
VLDL: 17 mg/dL (ref 0.0–40.0)

## 2016-08-28 LAB — CBC WITH DIFFERENTIAL/PLATELET
BASOS PCT: 0.6 % (ref 0.0–3.0)
Basophils Absolute: 0 10*3/uL (ref 0.0–0.1)
EOS ABS: 0.1 10*3/uL (ref 0.0–0.7)
EOS PCT: 0.8 % (ref 0.0–5.0)
HEMATOCRIT: 40.1 % (ref 36.0–46.0)
HEMOGLOBIN: 13.6 g/dL (ref 12.0–15.0)
LYMPHS PCT: 36.6 % (ref 12.0–46.0)
Lymphs Abs: 2.7 10*3/uL (ref 0.7–4.0)
MCHC: 34.1 g/dL (ref 30.0–36.0)
MCV: 91.6 fl (ref 78.0–100.0)
Monocytes Absolute: 0.6 10*3/uL (ref 0.1–1.0)
Monocytes Relative: 7.7 % (ref 3.0–12.0)
Neutro Abs: 4 10*3/uL (ref 1.4–7.7)
Neutrophils Relative %: 54.3 % (ref 43.0–77.0)
Platelets: 300 10*3/uL (ref 150.0–400.0)
RBC: 4.37 Mil/uL (ref 3.87–5.11)
RDW: 13.6 % (ref 11.5–15.5)
WBC: 7.4 10*3/uL (ref 4.0–10.5)

## 2016-08-28 LAB — COMPREHENSIVE METABOLIC PANEL
ALBUMIN: 4.3 g/dL (ref 3.5–5.2)
ALK PHOS: 55 U/L (ref 39–117)
ALT: 10 U/L (ref 0–35)
AST: 19 U/L (ref 0–37)
BILIRUBIN TOTAL: 0.6 mg/dL (ref 0.2–1.2)
BUN: 17 mg/dL (ref 6–23)
CALCIUM: 9.8 mg/dL (ref 8.4–10.5)
CHLORIDE: 102 meq/L (ref 96–112)
CO2: 31 mEq/L (ref 19–32)
CREATININE: 0.96 mg/dL (ref 0.40–1.20)
GFR: 58.73 mL/min — ABNORMAL LOW (ref >60.00)
Glucose, Bld: 91 mg/dL (ref 70–99)
Potassium: 4.5 mEq/L (ref 3.5–5.1)
SODIUM: 140 meq/L (ref 135–145)
TOTAL PROTEIN: 7.7 g/dL (ref 6.0–8.3)

## 2016-08-28 LAB — HEMOGLOBIN A1C: HEMOGLOBIN A1C: 5.6 % (ref 4.6–6.5)

## 2016-08-28 LAB — TSH: TSH: 1.33 u[IU]/mL (ref 0.35–4.50)

## 2016-08-28 NOTE — Assessment & Plan Note (Signed)
Declines further DEXA scans or medication treatment Continue calcium and vitamin D Continue regular activity/exercise

## 2016-08-28 NOTE — Assessment & Plan Note (Signed)
Sugars high normal-slightly above normal Check A1c given family history of diabetes

## 2016-08-28 NOTE — Assessment & Plan Note (Addendum)
Has never been on a statin Check cmp, lipids, tsh

## 2016-08-28 NOTE — Assessment & Plan Note (Signed)
Having frequent dysphagia Has seen Dr. Hilarie Fredrickson -no additional treatment possible Encouraged her to try ensure or protein drinks If symptoms worsen we'll refer back to GI, but she knows there may not be anything else that can be done

## 2016-08-28 NOTE — Assessment & Plan Note (Signed)
Cmp

## 2016-08-28 NOTE — Assessment & Plan Note (Signed)
Will check a1c, along with glucose

## 2016-08-28 NOTE — Progress Notes (Signed)
Pre visit review using our clinic review tool, if applicable. No additional management support is needed unless otherwise documented below in the visit note. 

## 2016-08-28 NOTE — Patient Instructions (Addendum)
Ms. Debs , Thank you for taking time to come for your Medicare Wellness Visit. I appreciate your ongoing commitment to your health goals. Please review the following plan we discussed and let me know if I can assist you in the future.   These are the goals we discussed: Goals    None      This is a list of the screening recommended for you and due dates:  Health Maintenance  Topic Date Due  . DEXA scan (bone density measurement)  01/16/2002  . Tetanus Vaccine  11/07/2019  . Flu Shot  Completed  . Shingles Vaccine  Completed  . Pneumonia vaccines  Completed     Test(s) ordered today. Your results will be released to [place] (or called to you) after review, usually within 72hours after test completion. If any changes need to be made, you will be notified at that same time.  All other Health Maintenance issues reviewed.   All recommended immunizations and age-appropriate screenings are up-to-date or discussed.  Flu immunization administered today.   Medications reviewed and updated.  No changes recommended at this time.   Please followup in one year for a wellness visit   Fall Prevention in the [place] can cause injuries and can affect people from all age groups. There are many simple things that you can do to make your home safe and to help prevent falls. WHAT CAN I DO ON THE OUTSIDE OF MY HOME?  Regularly repair the edges of walkways and driveways and fix any cracks.  Remove high doorway thresholds.  Trim any shrubbery on the main path into your home.  Use bright outdoor lighting.  Clear walkways of debris and clutter, including tools and rocks.  Regularly check that handrails are securely fastened and in good repair. Both sides of any steps should have handrails.  Install guardrails along the edges of any raised decks or porches.  Have leaves, snow, and ice cleared regularly.  Use sand or salt on walkways during winter months.  In the garage, clean up any  spills right away, including grease or oil spills. WHAT CAN I DO IN THE BATHROOM?  Use night lights.  Install grab bars by the toilet and in the tub and shower. Do not use towel bars as grab bars.  Use non-skid mats or decals on the floor of the tub or shower.  If you need to sit down while you are in the shower, use a plastic, non-slip stool.[place]  Keep the floor dry. Immediately clean up any water that spills on the floor.  Remove soap buildup in the tub or shower on a regular basis.  Attach bath mats securely with double-sided non-slip rug tape.  Remove throw rugs and other tripping hazards from the floor. WHAT CAN I DO IN THE BEDROOM?  Use night lights.  Make sure that a bedside light is easy to reach.  Do not use oversized bedding that drapes onto the floor.  Have a firm chair that has side arms to use for getting dressed.  Remove throw rugs and other tripping hazards from the floor. WHAT CAN I DO IN THE KITCHEN?   Clean up any spills right away.  Avoid walking on wet floors.  Place frequently used items in easy-to-reach places.  If you need to reach for something above you, use a sturdy step stool that has a grab bar.  Keep electrical cables out of the way.  Do not use floor polish or wax that  makes floors slippery. If you have to use wax, make sure that it is non-skid floor wax.  Remove throw rugs and other tripping hazards from the floor. WHAT CAN I DO IN THE STAIRWAYS?  Do not leave any items on the stairs.  Make sure that there are handrails on both sides of the stairs. Fix handrails that are broken or loose. Make sure that handrails are as long as the stairways.  Check any carpeting to make sure that it is firmly attached to the stairs. Fix any carpet that is loose or worn.  Avoid having throw rugs at the top or bottom of stairways, or secure the rugs with carpet tape to prevent them from moving.  Make sure that you have a light switch at the top of the  stairs and the bottom of the stairs. If you do not have them, have them installed. WHAT ARE SOME OTHER FALL PREVENTION TIPS?  Wear closed-toe shoes that fit well and support your feet. Wear shoes that have rubber soles or low heels.  When you use a stepladder, make sure that it is completely opened and that the sides are firmly locked. Have someone hold the ladder while you are using it. Do not climb a closed stepladder.  Add color or contrast paint or tape to grab bars and handrails in your home. Place contrasting color strips on the first and last steps.  Use mobility aids as needed, such as canes, walkers, scooters, and crutches.  Turn on lights if it is dark. Replace any light bulbs that burn out.  Set up furniture so that there are clear paths. Keep the furniture in the same spot.  Fix any uneven floor surfaces.  Choose a carpet design that does not hide the edge of steps of a stairway.  Be aware of any and all pets.  Review your medicines with your healthcare provider. Some medicines can cause dizziness or changes in blood pressure, which increase your risk of falling. Talk with your health care provider about other ways that you can decrease your risk of falls. This may include working with a physical therapist or trainer to improve your strength, balance, and endurance.   This information is not intended to replace advice given to you by your health care provider. Make sure you discuss any questions you have with your health care provider.   Document Released: 10/13/2002 Document Revised: 03/09/2015 Document Reviewed: 11/27/2014 Elsevier Interactive Patient Education [employer].

## 2016-08-29 DIAGNOSIS — H1851 Endothelial corneal dystrophy: Secondary | ICD-10-CM | POA: Diagnosis not present

## 2016-08-29 DIAGNOSIS — D3131 Benign neoplasm of right choroid: Secondary | ICD-10-CM | POA: Diagnosis not present

## 2016-08-30 ENCOUNTER — Encounter: Payer: Self-pay | Admitting: Internal Medicine

## 2016-08-31 ENCOUNTER — Other Ambulatory Visit: Payer: Self-pay | Admitting: Internal Medicine

## 2016-09-01 ENCOUNTER — Other Ambulatory Visit: Payer: Self-pay | Admitting: Internal Medicine

## 2016-09-01 NOTE — Telephone Encounter (Signed)
flecainide (TAMBOCOR) 50 MG tablet  Medication  Date: 08/31/2016 Department: [facility] [place] Office Ordering/Authorizing: Evans Lance, MD  Order Providers   Prescribing Provider Encounter Provider  Evans Lance, MD Evans Lance, MD  Medication Detail    Disp Refills Start End   flecainide (TAMBOCOR) 50 MG tablet 180 tablet 0 08/31/2016    Sig: TAKE ONE TABLET BY MOUTH TWICE DAILY   E-Prescribing Status: Receipt confirmed by pharmacy (08/31/2016 3:27 PM EDT)   Pharmacy   [facility] [place], [place] - [id number]

## 2016-10-11 ENCOUNTER — Ambulatory Visit (INDEPENDENT_AMBULATORY_CARE_PROVIDER_SITE_OTHER): Payer: Medicare Other | Admitting: Internal Medicine

## 2016-10-11 ENCOUNTER — Encounter: Payer: Self-pay | Admitting: Internal Medicine

## 2016-10-11 VITALS — BP 120/69 | HR 70 | Ht 61.0 in | Wt 106.0 lb

## 2016-10-11 DIAGNOSIS — I493 Ventricular premature depolarization: Secondary | ICD-10-CM

## 2016-10-11 DIAGNOSIS — I341 Nonrheumatic mitral (valve) prolapse: Secondary | ICD-10-CM | POA: Diagnosis not present

## 2016-10-11 MED ORDER — BISOPROLOL FUMARATE 5 MG PO TABS: ORAL_TABLET | ORAL | 3 refills | Status: DC

## 2016-10-11 MED ORDER — FLECAINIDE ACETATE 50 MG PO TABS: 50.0000 mg | ORAL_TABLET | Freq: Two times a day (BID) | ORAL | 3 refills | Status: DC

## 2016-10-11 NOTE — Progress Notes (Signed)
HPI Mrs. Zemba returns today for followup of very dense ventricular ectopy. The patient is an 82 year old woman whose health has otherwise been good. She has been bothered by palpitations due to PVC's which we treated with flecainide. She has done well in the past year. She remains active walking regularly. She has no palpitations on low dose flecainide.  Allergies  Allergen Reactions  . Diltiazem Hcl Other (See Comments)    Rapid heartbeat and elevated blood pressure  . Inderal [Propranolol Hcl]     Pt does not remember reaction  . Lopressor [Metoprolol Tartrate]     Per the pt' "I felt like I was going to pass out"  . Penicillins Diarrhea     Current Outpatient Prescriptions  Medication Sig Dispense Refill  . bisoprolol (ZEBETA) 5 MG tablet Take 1/2 tablet (2.5 mg) by mouth once daily 45 tablet 3  . calcium gluconate 500 MG tablet Take 500 mg by mouth daily.      . flecainide (TAMBOCOR) 50 MG tablet TAKE ONE TABLET BY MOUTH TWICE DAILY 180 tablet 0  . Multiple Vitamin (MULTIVITAMIN WITH MINERALS) TABS tablet Take 1 tablet by mouth daily.     No current facility-administered medications for this visit.      Past Medical History:  Diagnosis Date  . Arthritis   . Atrial fibrillation ([place])   . Bowel obstruction 2000  . Cataract    bilateral removed  . HLD (hyperlipidemia)   . Hypercontractile esophagus   . Mitral valve prolapse   . Osteoporosis    declines rx and declines DEXA f/u  . Parathyroid adenoma    post parathyroid surgery in October of 2008  . Paroxysmal supraventricular tachycardia ([place])   . Premature ventricular contractions (PVCs) (VPCs)   . Schatzki's ring 08/2013   Dr. Hilarie Fredrickson, s/p dilation 08/2013, ?benefit  . Squamous cell carcinoma in situ of skin    face  . Urinary incontinence     ROS:   All systems reviewed and negative except as noted in the HPI.   Past Surgical History:  Procedure Laterality Date  . ABDOMINAL HYSTERECTOMY  1989  .  BREAST BIOPSY  1979   left  . CATARACT EXTRACTION     bilateral  . ESOPHAGEAL MANOMETRY N/A 11/17/2015   Procedure: ESOPHAGEAL MANOMETRY (EM);  Surgeon: Jerene Bears, MD;  Location: [facility];  Service: Gastroenterology;  Laterality: N/A;  . LAPAROSCOPIC SMALL BOWEL RESECTION  2000  . parathyroid surgery  2008?   left  . SHOULDER SURGERY  02/1999   right  . SKIN LESION EXCISION  08/1999   left side of face     Family History  Problem Relation Age of Onset  . Arthritis Father   . Heart disease Father   . Hypertension Father   . Stroke Father   . Heart attack Father   . Arthritis Mother   . Stroke Mother   . Stroke Brother   . Diabetes Brother   . Hypertension Brother   . Colon cancer Sister     in her 64's  . Hyperlipidemia Sister   . Hypertension Sister   . Hypertension Sister   . Diabetes Sister   . Esophageal cancer Neg Hx   . Rectal cancer Neg Hx   . Stomach cancer Neg Hx      Social History   Social History  . Marital status: Married    Spouse name: N/A  . Number of children: 4  .  Years of education: 12+   Occupational History  . Retired     Research officer, political party    Social History Main Topics  . Smoking status: Never Smoker  . Smokeless tobacco: Never Used  . Alcohol use No  . Drug use: No  . Sexual activity: Not on file   Other Topics Concern  . Not on file   Social History Narrative   Regular exercise-sometimes   Caffeine Use-no   married x 2, caregiver of 33yo husband (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) - retired Adult nurse     BP 120/69   Pulse 70   Ht 5\' 1"  (1.549 m)   Wt 106 lb (48.1 kg)   BMI 20.03 kg/m   Physical Exam:  Well appearing elderly woman, NAD HEENT: Unremarkable Neck:  6 cm JVD, no thyromegally Back:  No CVA tenderness Lungs:  Clear with no wheezes, rales, or rhonchi. HEART:  IRegular rate rhythm, no murmurs, no rubs, no clicks Abd:  soft, positive bowel sounds, no organomegally, no rebound, no guarding Ext:  2 plus pulses, no  edema, no cyanosis, no clubbing Skin:  No rashes no nodules Neuro:  CN II through XII intact, motor grossly intact  EKG - normal sinus rhythm. QRS - 92.  Assess/Plan: 1. Palpitations due to PVC's - these remain resolved on low dose flecainide. She will continue her current meds. 2. Dyspnea - she notes that walking up a hill during the summer she has sob but this has gotten better. I have asked her to let me know if her symptoms worsen.   Mikle Bosworth.D.

## 2016-10-11 NOTE — Patient Instructions (Signed)

## 2017-01-25 DIAGNOSIS — D2261 Melanocytic nevi of right upper limb, including shoulder: Secondary | ICD-10-CM | POA: Diagnosis not present

## 2017-01-25 DIAGNOSIS — D692 Other nonthrombocytopenic purpura: Secondary | ICD-10-CM | POA: Diagnosis not present

## 2017-01-25 DIAGNOSIS — L821 Other seborrheic keratosis: Secondary | ICD-10-CM | POA: Diagnosis not present

## 2017-01-25 DIAGNOSIS — D2262 Melanocytic nevi of left upper limb, including shoulder: Secondary | ICD-10-CM | POA: Diagnosis not present

## 2017-01-25 DIAGNOSIS — L814 Other melanin hyperpigmentation: Secondary | ICD-10-CM | POA: Diagnosis not present

## 2017-01-25 DIAGNOSIS — L570 Actinic keratosis: Secondary | ICD-10-CM | POA: Diagnosis not present

## 2017-01-25 DIAGNOSIS — D1801 Hemangioma of skin and subcutaneous tissue: Secondary | ICD-10-CM | POA: Diagnosis not present

## 2017-01-25 DIAGNOSIS — D2272 Melanocytic nevi of left lower limb, including hip: Secondary | ICD-10-CM | POA: Diagnosis not present

## 2017-01-25 DIAGNOSIS — Z85828 Personal history of other malignant neoplasm of skin: Secondary | ICD-10-CM | POA: Diagnosis not present

## 2017-01-25 DIAGNOSIS — D225 Melanocytic nevi of trunk: Secondary | ICD-10-CM | POA: Diagnosis not present

## 2017-01-25 DIAGNOSIS — D2271 Melanocytic nevi of right lower limb, including hip: Secondary | ICD-10-CM | POA: Diagnosis not present

## 2017-06-18 ENCOUNTER — Telehealth: Payer: Self-pay | Admitting: Internal Medicine

## 2017-06-18 NOTE — Telephone Encounter (Signed)
Called pt to schedule AWV. Pt stated that she does not see the point in scheduling AWV and declines at this time. SF

## 2017-09-04 ENCOUNTER — Other Ambulatory Visit (INDEPENDENT_AMBULATORY_CARE_PROVIDER_SITE_OTHER): Payer: Medicare Other

## 2017-09-04 ENCOUNTER — Ambulatory Visit (INDEPENDENT_AMBULATORY_CARE_PROVIDER_SITE_OTHER): Payer: Medicare Other | Admitting: Internal Medicine

## 2017-09-04 ENCOUNTER — Ambulatory Visit (INDEPENDENT_AMBULATORY_CARE_PROVIDER_SITE_OTHER)
Admission: RE | Admit: 2017-09-04 | Discharge: 2017-09-04 | Disposition: A | Discharge status: Home / Self Care | Payer: Medicare Other | Source: Ambulatory Visit | Attending: Internal Medicine | Admitting: Internal Medicine

## 2017-09-04 ENCOUNTER — Encounter: Payer: Self-pay | Admitting: Internal Medicine

## 2017-09-04 VITALS — BP 142/90 | HR 76 | Temp 98.0°F | Resp 16 | Wt 107.0 lb

## 2017-09-04 DIAGNOSIS — Z0000 Encounter for general adult medical examination without abnormal findings: Secondary | ICD-10-CM

## 2017-09-04 DIAGNOSIS — E785 Hyperlipidemia, unspecified: Secondary | ICD-10-CM

## 2017-09-04 DIAGNOSIS — Z23 Encounter for immunization: Secondary | ICD-10-CM

## 2017-09-04 DIAGNOSIS — M810 Age-related osteoporosis without current pathological fracture: Secondary | ICD-10-CM

## 2017-09-04 DIAGNOSIS — R059 Cough, unspecified: Secondary | ICD-10-CM

## 2017-09-04 DIAGNOSIS — I493 Ventricular premature depolarization: Secondary | ICD-10-CM | POA: Diagnosis not present

## 2017-09-04 DIAGNOSIS — R1319 Other dysphagia: Secondary | ICD-10-CM

## 2017-09-04 DIAGNOSIS — R05 Cough: Secondary | ICD-10-CM

## 2017-09-04 LAB — CBC WITH DIFFERENTIAL/PLATELET
Basophils Absolute: 0 10*3/uL (ref 0.0–0.1)
Basophils Relative: 0.7 % (ref 0.0–3.0)
EOS PCT: 1.5 % (ref 0.0–5.0)
Eosinophils Absolute: 0.1 10*3/uL (ref 0.0–0.7)
HEMATOCRIT: 40.9 % (ref 36.0–46.0)
HEMOGLOBIN: 13.6 g/dL (ref 12.0–15.0)
LYMPHS ABS: 2.2 10*3/uL (ref 0.7–4.0)
LYMPHS PCT: 37.4 % (ref 12.0–46.0)
MCHC: 33.2 g/dL (ref 30.0–36.0)
MCV: 94.6 fl (ref 78.0–100.0)
MONOS PCT: 8.1 % (ref 3.0–12.0)
Monocytes Absolute: 0.5 10*3/uL (ref 0.1–1.0)
NEUTROS PCT: 52.3 % (ref 43.0–77.0)
Neutro Abs: 3.1 10*3/uL (ref 1.4–7.7)
Platelets: 301 10*3/uL (ref 150.0–400.0)
RBC: 4.32 Mil/uL (ref 3.87–5.11)
RDW: 13.4 % (ref 11.5–15.5)
WBC: 5.9 10*3/uL (ref 4.0–10.5)

## 2017-09-04 LAB — COMPREHENSIVE METABOLIC PANEL
ALBUMIN: 4.2 g/dL (ref 3.5–5.2)
ALK PHOS: 51 U/L (ref 39–117)
ALT: 9 U/L (ref 0–35)
AST: 19 U/L (ref 0–37)
BILIRUBIN TOTAL: 0.5 mg/dL (ref 0.2–1.2)
BUN: 19 mg/dL (ref 6–23)
CALCIUM: 9.5 mg/dL (ref 8.4–10.5)
CO2: 29 mEq/L (ref 19–32)
Chloride: 101 mEq/L (ref 96–112)
Creatinine, Ser: 0.91 mg/dL (ref 0.40–1.20)
GFR: 62.32 mL/min (ref >60.00)
Glucose, Bld: 103 mg/dL — ABNORMAL HIGH (ref 70–99)
POTASSIUM: 4.2 meq/L (ref 3.5–5.1)
Sodium: 140 mEq/L (ref 135–145)
TOTAL PROTEIN: 7.2 g/dL (ref 6.0–8.3)

## 2017-09-04 LAB — LIPID PANEL
CHOLESTEROL: 210 mg/dL — AB (ref 0–200)
HDL: 70.2 mg/dL (ref >39.00)
LDL Cholesterol: 121 mg/dL — ABNORMAL HIGH (ref 0–99)
NonHDL: 139.62
TRIGLYCERIDES: 92 mg/dL (ref 0.0–149.0)
Total CHOL/HDL Ratio: 3
VLDL: 18.4 mg/dL (ref 0.0–40.0)

## 2017-09-04 LAB — TSH: TSH: 1.49 u[IU]/mL (ref 0.35–4.50)

## 2017-09-04 NOTE — Assessment & Plan Note (Signed)
Following with Dr Lovena Le Controlled with current medications-bisoprolol and flecainide

## 2017-09-04 NOTE — Assessment & Plan Note (Signed)
Not on medication Check labs - cmp, lipid, tsh

## 2017-09-04 NOTE — Progress Notes (Signed)
Subjective:    Patient ID: Michelle Vazquez, female    DOB: 1928-11-12, 83 y.o.   MRN: [id number]  HPI Here for medicare wellness exam.   I have personally reviewed and have noted 1.The patient's medical and social history 2.Their use of alcohol, tobacco or illicit drugs 3.Their current medications and supplements 4.The patient's functional ability including ADL's, fall risks, home                 safety risk and hearing or visual impairment. 5.Diet and physical activities 6.Evidence for depression or mood disorders 7.Care team reviewed  -   Cardio  - Dr Lovena Le, GI - Dr Hilarie Fredrickson  Dysphagia due to esophageal dysmotility and hypercontractile esophagus: She thinks her swallowing is a little worse and she coughs more with it.  She denies reflux.  She needs to take very small bites - she can only swallow 1/2 t of liquid at a time.  There are certain foods she needs to avoid.  Her weight has remained stable.  She is concerned about the cough and thinks it is related to her swallowing, but is not completely sure.  She denies any shortness of breath or wheezing.   Are there smokers in your home (other than you)? No  Risk Factors Exercise:  House work, yard work Dietary issues discussed: limited in what she can eat due to esophageal disorder- tries to eat fruits/veges  Vitamin and supplement use:  Taking calcium   Opiod use:   none Side effects from medication:   n/a Does medications benefits outweigh risks/side effects:    n/a  Cardiac risk factors: advanced age, hypertension, hyperlipidemia  Depression Screen  Have you felt down, depressed or hopeless? No  Have you felt little interest or pleasure in doing things?  No  Activities of Daily Living In your present state of health, do you have any difficulty performing the following activities?:  Driving? No Managing money?  No Feeding yourself? No Getting from bed to  chair? No Climbing a flight of stairs? No Preparing food and eating?: No Bathing or showering? No Getting dressed: No Getting to/using the toilet? No Moving around from place to place: No In the past year have you fallen or had a near fall?: No   Are you sexually active?  No    Hearing Difficulties: No Do you often ask people to speak up or repeat themselves? No Do you experience ringing or noises in your ears? No Do you have difficulty understanding soft or whispered voices? No Vision:              Any change in vision:  no             Up to date with eye exam:    Up to date   Memory:  Do you feel that you have a problem with memory? No  Do you often misplace items? No  Do you feel safe at home?  Yes  Cognitive Testing  Alert, Orientated? Yes  Normal Appearance? Yes  Recall of three objects?  Yes  Can perform simple calculations? Yes  Displays appropriate judgment? Yes  Can read the correct time from a watch face? Yes   Advanced Directives have been discussed with the patient? Yes   Medications and allergies reviewed with patient and updated if appropriate.  Patient Active Problem List   Diagnosis Date Noted  . Family history of diabetes mellitus (DM) 08/28/2016  . Osteoporosis   . Dysphagia  08/28/2013  . Arthritis   . HLD (hyperlipidemia)   . 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 Mitral valve prolapse 06/21/2011  . Parathyroid adenoma 06/21/2011    Current Outpatient Prescriptions on File Prior to Visit  Medication Sig Dispense Refill  . bisoprolol (ZEBETA) 5 MG tablet Take 1/2 tablet (2.5 mg) by mouth once daily 45 tablet 3  . calcium gluconate 500 MG tablet Take 500 mg by mouth daily.      . flecainide (TAMBOCOR) 50 MG tablet Take 1 tablet (50 mg total) by mouth 2 (two) times daily. 180 tablet 3  . Multiple Vitamin (MULTIVITAMIN WITH MINERALS) TABS tablet Take 1 tablet by mouth daily.     No current facility-administered medications on file prior to visit.      Past Medical History:  Diagnosis Date  . Arthritis   . Atrial fibrillation ([place])   . Bowel obstruction ([place]) 2000  . Cataract    bilateral removed  . HLD (hyperlipidemia)   . Hypercontractile esophagus   . Mitral valve prolapse   . Osteoporosis    declines rx and declines DEXA f/u  . Parathyroid adenoma    post parathyroid surgery in October of 2008  . Paroxysmal supraventricular tachycardia ([place])   . Premature ventricular contractions (PVCs) (VPCs)   . Schatzki's ring 08/2013   Dr. Hilarie Fredrickson, s/p dilation 08/2013, ?benefit  . Squamous cell carcinoma in situ of skin    face  . Urinary incontinence     Past Surgical History:  Procedure Laterality Date  . ABDOMINAL HYSTERECTOMY  1989  . BREAST BIOPSY  1979   left  . CATARACT EXTRACTION     bilateral  . ESOPHAGEAL MANOMETRY N/A 11/17/2015   Procedure: ESOPHAGEAL MANOMETRY (EM);  Surgeon: Jerene Bears, MD;  Location: [facility];  Service: Gastroenterology;  Laterality: N/A;  . LAPAROSCOPIC SMALL BOWEL RESECTION  2000  . parathyroid surgery  2008?   left  . SHOULDER SURGERY  02/1999   right  . SKIN LESION EXCISION  08/1999   left side of face    Social History   Social History  . Marital status: Married    Spouse name: N/A  . Number of children: 4  . Years of education: 12+   Occupational History  . Retired     Research officer, political party    Social History Main Topics  . Smoking status: Never Smoker  . Smokeless tobacco: Never Used  . Alcohol use No  . Drug use: No  . Sexual activity: Not Asked   Other Topics Concern  . None   Social History Narrative   Regular exercise-sometimes   Caffeine Use-no   married x 2, caregiver of 38yo husband (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) - retired Adult nurse    Family History  Problem Relation Age of Onset  . Arthritis Father   . Heart disease Father   . Hypertension Father   . Stroke Father   . Heart attack Father   . Arthritis Mother   . Stroke Mother   . Stroke Brother   .  Diabetes Brother   . Hypertension Brother   . Colon cancer Sister        in her 59's  . Hyperlipidemia Sister   . Hypertension Sister   . Hypertension Sister   . Diabetes Sister   . Esophageal cancer Neg Hx   . Rectal cancer Neg Hx   . Stomach cancer Neg Hx     Review of Systems  Constitutional: Negative for chills  and fever.  HENT: Positive for trouble swallowing. Negative for hearing loss, postnasal drip and tinnitus.   Eyes: Negative for visual disturbance.  Respiratory: Positive for cough (coughs up saliva) and shortness of breath (with walking up hills only). Negative for wheezing.   Cardiovascular: Negative for chest pain, palpitations and leg swelling.  Gastrointestinal: Negative for abdominal pain and nausea.       No gerd  Neurological: Negative for light-headedness and headaches.  Hematological: Bruises/bleeds easily.       Objective:   Vitals:   09/04/17 0852  BP: (!) 142/90  Pulse: 76  Resp: 16  Temp: 98 F (36.7 C)  SpO2: 98%   Filed Weights   09/04/17 0852  Weight: 107 lb (48.5 kg)   Body mass index is 20.22 kg/m.  Wt Readings from Last 3 Encounters:  09/04/17 107 lb (48.5 kg)  10/11/16 106 lb (48.1 kg)  08/28/16 105 lb (47.6 kg)     Physical Exam Constitutional: Appears well-developed and well-nourished. No distress.  HENT:  Head: Normocephalic and atraumatic.  Neck: Neck supple. No tracheal deviation present. No thyromegaly present.  No cervical lymphadenopathy Cardiovascular: Normal rate, regular rhythm and normal heart sounds.   No murmur heard. No carotid bruit .  No edema Pulmonary/Chest: Effort normal and breath sounds normal. No respiratory distress. No has no wheezes. No rales.  Skin: Skin is warm and dry. Not diaphoretic.  Psychiatric: Normal mood and affect. Behavior is normal.         Assessment & Plan:   Wellness Exam: Immunizations  shingrix discussed, flu vaccine today Colonoscopy - no longer needed Mammogram - no  longer needed Dexa - deferred - does not want Eye exam   Up to date  Hearing loss   none Memory concerns/difficulties   No  Independent of ADLs  Fully independent Stressed the importance of regular exercise   Patient received copy of preventative screening tests/immunizations recommended for the next 5-10 years.   See Problem List for Assessment and Plan of chronic medical problems.    Follow-up annually

## 2017-09-04 NOTE — Assessment & Plan Note (Signed)
Defers DEXA consideration of treatment Continue increased activity Continue calcium Fall prevention discussed

## 2017-09-04 NOTE — Assessment & Plan Note (Addendum)
Last saw Dr Hilarie Fredrickson 01/2016 She thinks her swallowing has worsened Recommended retrying the altoid pepermints  - take just before eating - she did try this in the past, but not just before eating If no improvement will see Dr Hilarie Fredrickson - ? Trial of imdur per his last note

## 2017-09-04 NOTE — Patient Instructions (Addendum)
  Ms. Hegwood , Thank you for taking time to come for your Medicare Wellness Visit. I appreciate your ongoing commitment to your health goals. Please review the following plan we discussed and let me know if I can assist you in the future.   These are the goals we discussed: Goals    None      This is a list of the screening recommended for you and due dates:  Health Maintenance  Topic Date Due  . DEXA scan (bone density measurement)  11/06/2028*  . Tetanus Vaccine  11/07/2019  . Flu Shot  Completed  . Pneumonia vaccines  Completed  *Topic was postponed. The date shown is not the original due date.     Test(s) ordered today. Your results will be released to [facility] (or called to you) after review, usually within 72hours after test completion. If any changes need to be made, you will be notified at that same time.  All other Health Maintenance issues reviewed.   All recommended immunizations and age-appropriate screenings are up-to-date or discussed.  Flu immunization administered today.   Medications reviewed and updated.  No changes recommended at this time.   Please followup in 1 year

## 2017-09-04 NOTE — Assessment & Plan Note (Addendum)
We will check A1c Recommended low sugar/carbohydrate diet

## 2017-09-04 NOTE — Assessment & Plan Note (Signed)
Likely related to esophageal dysmotility Will check cxr

## 2017-09-11 DIAGNOSIS — D3131 Benign neoplasm of right choroid: Secondary | ICD-10-CM | POA: Diagnosis not present

## 2017-09-11 DIAGNOSIS — H26491 Other secondary cataract, right eye: Secondary | ICD-10-CM | POA: Diagnosis not present

## 2017-09-11 DIAGNOSIS — H35372 Puckering of macula, left eye: Secondary | ICD-10-CM | POA: Diagnosis not present

## 2017-09-11 DIAGNOSIS — H1851 Endothelial corneal dystrophy: Secondary | ICD-10-CM | POA: Diagnosis not present

## 2017-10-26 ENCOUNTER — Encounter: Payer: Self-pay | Admitting: Internal Medicine

## 2017-10-26 ENCOUNTER — Ambulatory Visit (INDEPENDENT_AMBULATORY_CARE_PROVIDER_SITE_OTHER): Payer: Medicare Other | Admitting: Internal Medicine

## 2017-10-26 VITALS — BP 122/64 | HR 67 | Ht 61.0 in | Wt 103.0 lb

## 2017-10-26 DIAGNOSIS — I493 Ventricular premature depolarization: Secondary | ICD-10-CM | POA: Diagnosis not present

## 2017-10-26 MED ORDER — BISOPROLOL FUMARATE 5 MG PO TABS: ORAL_TABLET | ORAL | 3 refills | Status: DC

## 2017-10-26 MED ORDER — FLECAINIDE ACETATE 50 MG PO TABS: 50.0000 mg | ORAL_TABLET | Freq: Two times a day (BID) | ORAL | 3 refills | Status: DC

## 2017-10-26 NOTE — Patient Instructions (Signed)

## 2017-10-26 NOTE — Progress Notes (Signed)
HPI Michelle Vazquez returns today for followup of her PVC's and HTN. She is a pleasant 83 yo woman with a h/o palpitations who has been placed on flecainide. Her PVC's have resolved. She has been out of her beta blocker. She has been tired as she is caring for her 78 year old husband. Allergies  Allergen Reactions  . Diltiazem Hcl Other (See Comments)    Rapid heartbeat and elevated blood pressure  . Inderal [Propranolol Hcl]     Pt does not remember reaction  . Lopressor [Metoprolol Tartrate]     Per the pt' "I felt like I was going to pass out"  . Penicillins Diarrhea     Current Outpatient Medications  Medication Sig Dispense Refill  . bisoprolol (ZEBETA) 5 MG tablet Take 1/2 tablet (2.5 mg) by mouth once daily 45 tablet 3  . calcium gluconate 500 MG tablet Take 500 mg by mouth daily.      . flecainide (TAMBOCOR) 50 MG tablet Take 1 tablet (50 mg total) by mouth 2 (two) times daily. 180 tablet 3  . Multiple Vitamin (MULTIVITAMIN WITH MINERALS) TABS tablet Take 1 tablet by mouth daily.     No current facility-administered medications for this visit.      Past Medical History:  Diagnosis Date  . Arthritis   . Atrial fibrillation ([place])   . Bowel obstruction ([place]) 2000  . Cataract    bilateral removed  . HLD (hyperlipidemia)   . Hypercontractile esophagus   . Mitral valve prolapse   . Osteoporosis    declines rx and declines DEXA f/u  . Parathyroid adenoma    post parathyroid surgery in October of 2008  . Paroxysmal supraventricular tachycardia ([place])   . Premature ventricular contractions (PVCs) (VPCs)   . Schatzki's ring 08/2013   Dr. Hilarie Fredrickson, s/p dilation 08/2013, ?benefit  . Squamous cell carcinoma in situ of skin    face  . Urinary incontinence     ROS:   All systems reviewed and negative except as noted in the HPI.   Past Surgical History:  Procedure Laterality Date  . ABDOMINAL HYSTERECTOMY  1989  . BREAST BIOPSY  1979   left  . CATARACT EXTRACTION      bilateral  . ESOPHAGEAL MANOMETRY N/A 11/17/2015   Procedure: ESOPHAGEAL MANOMETRY (EM);  Surgeon: Jerene Bears, MD;  [facility];  Service: Gastroenterology;  Laterality: N/A;  . LAPAROSCOPIC SMALL BOWEL RESECTION  2000  . parathyroid surgery  2008?   left  . SHOULDER SURGERY  02/1999   right  . SKIN LESION EXCISION  08/1999   left side of face     Family History  Problem Relation Age of Onset  . Arthritis Father   . Heart disease Father   . Hypertension Father   . Stroke Father   . Heart attack Father   . Arthritis Mother   . Stroke Mother   . Stroke Brother   . Diabetes Brother   . Hypertension Brother   . Colon cancer Sister        in her 37's  . Hyperlipidemia Sister   . Hypertension Sister   . Hypertension Sister   . Diabetes Sister   . Esophageal cancer Neg Hx   . Rectal cancer Neg Hx   . Stomach cancer Neg Hx      Social History   Socioeconomic History  . Marital status: Married    Spouse name: Not on file  .  Number of children: 4  . Years of education: 12+  . Highest education level: Not on file  Social Needs  . Financial resource strain: Not on file  . Food insecurity - worry: Not on file  . Food insecurity - inability: Not on file  . Transportation needs - medical: Not on file  . Transportation needs - non-medical: Not on file  Occupational History  . Occupation: Retired    Comment: Research officer, political party   Tobacco Use  . Smoking status: Never Smoker  . Smokeless tobacco: Never Used  Substance and Sexual Activity  . Alcohol use: No  . Drug use: No  . Sexual activity: Not on file  Other Topics Concern  . Not on file  Social History Narrative   Regular exercise-sometimes   Caffeine Use-no   married x 2, caregiver of 30yo husband (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) - retired Adult nurse     BP 122/64   Pulse 67   Ht 5\' 1"  (1.549 m)   Wt 103 lb (46.7 kg)   SpO2 98%   BMI 19.46 kg/m   Physical Exam:  Well appearing 83 yo woman,  NAD HEENT: Unremarkable Neck:  6 cm JVD, no thyromegally Lymphatics:  No adenopathy Back:  No CVA tenderness Lungs:  Clear, with no wheezes HEART:  Regular rate rhythm, no murmurs, no rubs, no clicks Abd:  soft, positive bowel sounds, no organomegally, no rebound, no guarding Ext:  2 plus pulses, no edema, no cyanosis, no clubbing Skin:  No rashes no nodules Neuro:  CN II through XII intact, motor grossly intact  EKG - nsr with first degree AV block  Assess/Plan: 1. Palpitation - her symptoms are well controlled. No change in meds.  2. PVC's - this is the cause of her palpitations. They are well controlled on the flecainide. 3. HTN - she was out of her Zebeta but plans to restart today.  Michelle Vazquez.D.

## 2018-01-23 DIAGNOSIS — L821 Other seborrheic keratosis: Secondary | ICD-10-CM | POA: Diagnosis not present

## 2018-01-23 DIAGNOSIS — Z85828 Personal history of other malignant neoplasm of skin: Secondary | ICD-10-CM | POA: Diagnosis not present

## 2018-01-23 DIAGNOSIS — L570 Actinic keratosis: Secondary | ICD-10-CM | POA: Diagnosis not present

## 2018-02-19 ENCOUNTER — Ambulatory Visit (INDEPENDENT_AMBULATORY_CARE_PROVIDER_SITE_OTHER): Payer: Medicare Other | Admitting: Internal Medicine

## 2018-02-19 ENCOUNTER — Encounter: Payer: Self-pay | Admitting: Internal Medicine

## 2018-02-19 VITALS — BP 114/64 | HR 74 | Ht 61.0 in | Wt 108.2 lb

## 2018-02-19 DIAGNOSIS — R1319 Other dysphagia: Secondary | ICD-10-CM

## 2018-02-19 DIAGNOSIS — K229 Disease of esophagus, unspecified: Secondary | ICD-10-CM | POA: Diagnosis not present

## 2018-02-19 DIAGNOSIS — R1310 Dysphagia, unspecified: Secondary | ICD-10-CM

## 2018-02-19 DIAGNOSIS — K224 Dyskinesia of esophagus: Secondary | ICD-10-CM

## 2018-02-19 NOTE — Progress Notes (Signed)
   Subjective:    Patient ID: Michelle Vazquez, female    DOB: 29-Jul-1929, 84 y.o.   MRN: [id number]  HPI Michelle Vazquez is an 84 year old female with a history of esophageal motility/jackhammer esophagus, mitral valve prolapse, PVCs, history of parathyroidectomy for parathyroid adenoma and hyperlipidemia who is here for follow-up.  She was last seen in March 2017.  She reports that she has continued to have issues with dysphagia which she feels has been somewhat progressive.  She states this is worse with solid food but can also be present with liquid.  She has to eat very very slowly but is able to complete her meals.  She has not lost weight.  She is not having heartburn or reflux symptoms.  She is not having chest pain.  She has no nausea, vomiting or abdominal pain.  No change in bowel habit.  She tried the peppermint out to avoid but was swallowing these because she does not really like the taste of peppermint.  With administering in this manner she did not find any improvement in her swallowing dysfunction.  In the past she had rapid heartbeat and elevated blood pressure with diltiazem and also feels that she had an adverse reaction to propranolol though this is less clear.  She had prior EGD with Savary dilation with no improvement in her swallowing dysfunction.  At the time of her last visit we discussed adding a long-acting nitrate but she declined.  Review of Systems As per HPI, otherwise negative  Current Medications, Allergies, Past Medical History, Past Surgical History, Family History and Social History were reviewed in [employer] record.     Objective:   Physical Exam BP 114/64   Pulse 74   Ht 5\' 1"  (1.549 m)   Wt 108 lb 3.2 oz (49.1 kg)   BMI 20.44 kg/m  Constitutional: Well-developed and well-nourished. No distress. HEENT: Normocephalic and atraumatic.   No scleral icterus. Neck: Neck supple. Trachea midline. Extremities: no clubbing, cyanosis, or  edema Neurological: Alert and oriented to person place and time. Skin: Skin is warm and dry. Psychiatric: Normal mood and affect. Behavior is normal.     Assessment & Plan:  84 year old female with a history of esophageal motility/jackhammer esophagus, mitral valve prolapse, PVCs, history of parathyroidectomy for parathyroid adenoma and hyperlipidemia who is here for follow-up  1. 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  Prior dilation was unhelpful.  She does not have reflux symptoms and therefore PPI will likely not benefit the symptoms.  I recommend that she try the peppermint altoids but use the sublingually, 2 altoids sublingual before each meal.  If this is unhelpful I will try her on nifedipine 10 mg 30 minutes before each meal.  She is asked to notify me in 2-4 weeks as to her response to these 2 therapies.  I will copy Dr. Lovena Le to let him know that I am possibly starting nifedipine.  25 minutes spent with the patient today. Greater than 50% was spent in counseling and coordination of care with the patient

## 2018-02-19 NOTE — Patient Instructions (Addendum)
Please use 2 peppermint altoids under your tongue before each meal. If this is NOT helping, then you may take nifedapen 10 mg, 30 minutes before each meal.   Please call us with an update on how you are doing in about 3 weeks.  If you are age 82 or older, your body mass index should be between 23-30. Your Body mass index is 20.44 kg/m. If this is out of the aforementioned range listed, please consider follow up with your Primary Care Provider.  If you are age 49 or younger, your body mass index should be between 19-25. Your Body mass index is 20.44 kg/m. If this is out of the aformentioned range listed, please consider follow up with your Primary Care Provider.

## 2018-02-20 ENCOUNTER — Telehealth: Payer: Self-pay | Admitting: *Deleted

## 2018-02-20 MED ORDER — NIFEDIPINE 10 MG PO CAPS: ORAL_CAPSULE | ORAL | 2 refills | Status: DC

## 2018-02-20 NOTE — Telephone Encounter (Signed)
-----   Message from Jerene Bears, MD sent at 02/20/2018  9:57 AM EDT ----- I think try nifedipine, but I also cc: her cardiologist Dr. Lovena Le to let him know what I was trying for her jackhammer esophagus and dysphagia. JMP  ----- Message ----- From: Larina Bras, CMA Sent: 02/20/2018   9:31 AM To: Jerene Bears, MD  Dr Hilarie Fredrickson- Meant to ask you yesterday and forgot... When I tried to order Nifedipen for patient, I got contraindication because she is allergic to diltiazem (heart palpiations and elevated blood pressure). It also appears her insurance does not prefer this med either. Any other options for her if the peppermint altoids dont work or do you still want me to send the nifedipen?

## 2018-02-28 NOTE — Telephone Encounter (Signed)
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   "Nifedipine is denied because the information was not sufficient to support approval for medical necessity. The following required information was not provided and/or clarified: 1. If your diagnosis includes Hypertension, stable angina, variant angina AND 2. The specific medical reasons you are unable to use two of the following formulary alternatives:  A. Amlodipine B. Nifedipine ER" _______________________________________ Information below is what was sent to insurance: Please provide the diagnosis for the medication being requested:  Jackhammer esophagus What is the start date?  02/20/18 What medication(s) has the patient tried and failed?  Inderal, diltiazem, lopressor Are there any supporting labs or test results?  yes _______________________________________  Dr Hilarie Fredrickson, please advise.... Do I need to appeal or do you have other options?

## 2018-02-28 NOTE — Telephone Encounter (Signed)
Patient has decided that she would like to give the altoids a bit longer to work. States that she has not been using consistently because she forgets. She will try to do this on a more consistent basis. She is a bit afraid to start nifedipine since diltiazem previously gave her some cardiac issues. I advised that should she decide at a later time to give nifedipine a try, to call us back and we will attempt to get the medication for her. She seems to also be concerned about whether Dr Lovena Le has okayed the medication. I advised that he was sent a copy of our note indicating that we would be trying nifedipine, however, if she decides she would like to start the medication at a later time, we will get personal clearance for her if it will make her feel more confident in taking the medication. She verbalizes understanding.

## 2018-02-28 NOTE — Telephone Encounter (Signed)
The generic is available 10 mg #90 from [facility] by [facility] for 29 dollars per month Would she be willing to pay this once to see if the medication is helpful and tolerated.  If so, then we can switch to the ER version which would be covered?  Please check with her

## 2018-03-14 DIAGNOSIS — D1801 Hemangioma of skin and subcutaneous tissue: Secondary | ICD-10-CM | POA: Diagnosis not present

## 2018-03-14 DIAGNOSIS — L570 Actinic keratosis: Secondary | ICD-10-CM | POA: Diagnosis not present

## 2018-03-14 DIAGNOSIS — L814 Other melanin hyperpigmentation: Secondary | ICD-10-CM | POA: Diagnosis not present

## 2018-03-14 DIAGNOSIS — D692 Other nonthrombocytopenic purpura: Secondary | ICD-10-CM | POA: Diagnosis not present

## 2018-03-14 DIAGNOSIS — D2272 Melanocytic nevi of left lower limb, including hip: Secondary | ICD-10-CM | POA: Diagnosis not present

## 2018-03-14 DIAGNOSIS — L821 Other seborrheic keratosis: Secondary | ICD-10-CM | POA: Diagnosis not present

## 2018-03-14 DIAGNOSIS — Z85828 Personal history of other malignant neoplasm of skin: Secondary | ICD-10-CM | POA: Diagnosis not present

## 2018-09-04 ENCOUNTER — Ambulatory Visit: Payer: Medicare Other | Admitting: Internal Medicine

## 2018-09-05 NOTE — Progress Notes (Signed)
Subjective:    Patient ID: Michelle Vazquez, female    DOB: 1929-08-29, 84 y.o.   MRN: [id number]  HPI Here for medicare wellness exam and follow up of her chronic medical problems.   I have personally reviewed and have noted 1.The patient's medical and social history 2.Their use of alcohol, tobacco or illicit drugs 3.Their current medications and supplements 4.The patient's functional ability including ADL's, fall risks, home                 safety risk and hearing or visual impairment. 5.Diet and physical activities 6.Evidence for depression or mood disorders 7.Care team reviewed  -   Eye, Cardio- Dr Lovena Le, GI - Dr Hilarie Fredrickson  esophageal dysmotility:   She follows with Dr Raquel James.  She drinks boost. She tries to avoid the foods that are difficult to eat. She coughs with eating and has saliva coming up a lot.  She takes small bites.    PVC's: she follows with Dr Lovena Le.  She is taking flecainide and bisoprolol daily.    Are there smokers in your home (other than you)? No  Risk Factors Exercise:  House and yard work Dietary issues discussed:  Eats too many sweets, drinks boost but does not like it, tries to eats enough fruits and veges, does not eat enough protein  Vitamin and supplement use: MVI, calcium  Opiod use:  no   Cardiac risk factors: advanced age  Depression Screen  Have you felt down, depressed or hopeless? No  Have you felt little interest or pleasure in doing things?  No  Activities of Daily Living In your present state of health, do you have any difficulty performing the following activities?:  Driving? No Managing money?  No Feeding yourself? No Getting from bed to chair? No Climbing a flight of stairs? No Preparing food and eating?: No Bathing or showering? No Getting dressed: No Getting to/using the toilet? No Moving around from place to place: No In the past year have you fallen or had a  near fall?: No   Are you sexually active?  No  Do you have more than one partner?  N/A  Hearing Difficulties: No Do you often ask people to speak up or repeat themselves? No Do you experience ringing or noises in your ears? No Do you have difficulty understanding soft or whispered voices? No Vision:              Any change in vision:   no             Up to date with eye exam:   Myesemory:  Do you feel that you have a problem with memory? No  Do you often misplace items? No  Do you feel safe at home?  Yes  Cognitive Testing  Alert, Orientated? Yes  Normal Appearance? Yes  Recall of three objects?  Yes  Can perform simple calculations? Yes  Displays appropriate judgment? Yes  Can read the correct time from a watch face? Yes   Advanced Directives have been discussed with the patient? Yes     Medications and allergies reviewed with patient and updated if appropriate.  Patient Active Problem List   Diagnosis Date Noted  . Cough 09/04/2017  . Family history of diabetes mellitus (DM) 08/28/2016  . Osteoporosis   . Dysphagia 08/28/2013  . Arthritis   . HLD (hyperlipidemia)   . 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 Mitral valve prolapse 06/21/2011  . Parathyroid adenoma 06/21/2011  Current Outpatient Medications on File Prior to Visit  Medication Sig Dispense Refill  . bisoprolol (ZEBETA) 5 MG tablet Take 1/2 tablet (2.5 mg) by mouth once daily 45 tablet 3  . calcium gluconate 500 MG tablet Take 500 mg by mouth daily.      . flecainide (TAMBOCOR) 50 MG tablet Take 1 tablet (50 mg total) by mouth 2 (two) times daily. 180 tablet 3  . Multiple Vitamin (MULTIVITAMIN WITH MINERALS) TABS tablet Take 1 tablet by mouth daily.     No current facility-administered medications on file prior to visit.     Past Medical History:  Diagnosis Date  . Arthritis   . Atrial fibrillation ([place])   . Bowel obstruction ([place]) 2000  . Cataract    bilateral removed  . HLD (hyperlipidemia)   .  Hypercontractile esophagus   . Mitral valve prolapse   . Osteoporosis    declines rx and declines DEXA f/u  . Parathyroid adenoma    post parathyroid surgery in October of 2008  . Paroxysmal supraventricular tachycardia ([place])   . Premature ventricular contractions (PVCs) (VPCs)   . Schatzki's ring 08/2013   Dr. Hilarie Fredrickson, s/p dilation 08/2013, ?benefit  . Squamous cell carcinoma in situ of skin    face  . Urinary incontinence     Past Surgical History:  Procedure Laterality Date  . ABDOMINAL HYSTERECTOMY  1989  . BREAST BIOPSY  1979   left  . CATARACT EXTRACTION     bilateral  . ESOPHAGEAL MANOMETRY N/A 11/17/2015   Procedure: ESOPHAGEAL MANOMETRY (EM);  Surgeon: Jerene Bears, MD;  Location: [facility];  Service: Gastroenterology;  Laterality: N/A;  . LAPAROSCOPIC SMALL BOWEL RESECTION  2000  . parathyroid surgery  2008?   left  . SHOULDER SURGERY  02/1999   right  . SKIN LESION EXCISION  08/1999   left side of face    Social History   Socioeconomic History  . Marital status: Married    Spouse name: Not on file  . Number of children: 4  . Years of education: 12+  . Highest education level: Not on file  Occupational History  . Occupation: Retired    Comment: Research officer, political party   Social Needs  . Financial resource strain: Not on file  . Food insecurity:    Worry: Not on file    Inability: Not on file  . Transportation needs:    Medical: Not on file    Non-medical: Not on file  Tobacco Use  . Smoking status: Never Smoker  . Smokeless tobacco: Never Used  Substance and Sexual Activity  . Alcohol use: No  . Drug use: No  . Sexual activity: Not Currently  Lifestyle  . Physical activity:    Days per week: Not on file    Minutes per session: Not on file  . Stress: Not on file  Relationships  . Social connections:    Talks on phone: Not on file    Gets together: Not on file    Attends religious service: Not on file    Active member of club or organization: Not on  file    Attends meetings of clubs or organizations: Not on file    Relationship status: Not on file  Other Topics Concern  . 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 Arthritis Father   . Heart disease Father   . Hypertension Father   . Stroke Father   . Heart attack Father   . Arthritis Mother   . Stroke Mother   . Stroke Brother   . Diabetes Brother   . Hypertension Brother   . Colon cancer Sister        in her 57's  . Hyperlipidemia Sister   . Hypertension Sister   . Hypertension Sister   . Diabetes Sister   . Esophageal cancer Neg Hx   . Rectal cancer Neg Hx   . Stomach cancer Neg Hx     Review of Systems  Constitutional: Negative for chills, fatigue, fever and unexpected weight change.  HENT: Positive for trouble swallowing. Negative for hearing loss.   Eyes: Negative for visual disturbance.  Respiratory: Positive for cough (from esophageal d/o). Negative for shortness of breath and wheezing.   Cardiovascular: Negative for chest pain (except for spasms in esophagus), palpitations and leg swelling.  Gastrointestinal: Negative for abdominal pain, blood in stool, constipation, diarrhea and nausea.       No reflux unless she bends over just after eating  Genitourinary: Negative for dysuria and hematuria.       Incontinence  Neurological: Negative for headaches.  Psychiatric/Behavioral: Negative for dysphoric mood. The patient is not nervous/anxious.        Objective:   Vitals:   09/06/18 0928  BP: 118/82  Pulse: 89  Resp: 16  Temp: 97.8 F (36.6 C)  SpO2: 98%   BP Readings from Last 3 Encounters:  09/06/18 118/82  02/19/18 114/64  10/26/17 122/64   Wt Readings from Last 3 Encounters:  09/06/18 106 lb 6.4 oz (48.3 kg)  02/19/18 108 lb 3.2 oz (49.1 kg)  10/26/17 103 lb  (46.7 kg)   Body mass index is 20.1 kg/m.   Physical Exam    Constitutional: Appears well-developed and well-nourished. No distress.  HENT:  Head: Normocephalic and atraumatic.  Neck: Neck supple. No tracheal deviation present. No thyromegaly present.  No cervical lymphadenopathy Cardiovascular: Normal rate, regular rhythm and normal heart sounds.   No murmur heard. No carotid bruit .  No edema Pulmonary/Chest: Effort normal and breath sounds normal. No respiratory distress. No has no wheezes. No rales.  Skin: Skin is warm and dry. Not diaphoretic.  Psychiatric: Normal mood and affect. Behavior is normal.      Assessment & Plan:   Wellness Exam: Immunizations   Flu shot today, discussed shingrix, others up to date Colonoscopy   No longer needed Mammogram  No longer needed Dexa   - deferred Gyn     No longer needed Eye exam   Up to date  Hearing loss  none Memory concerns/difficulties   none Independent of ADLs  Fully independent Stressed the importance of regular exercise   Patient received copy of preventative screening tests/immunizations recommended for the next 5-10 years.     See Problem List for Assessment and Plan of chronic medical problems.    FU in one year

## 2018-09-06 ENCOUNTER — Encounter: Payer: Self-pay | Admitting: Internal Medicine

## 2018-09-06 ENCOUNTER — Ambulatory Visit (INDEPENDENT_AMBULATORY_CARE_PROVIDER_SITE_OTHER): Payer: Medicare Other | Admitting: Internal Medicine

## 2018-09-06 ENCOUNTER — Ambulatory Visit (INDEPENDENT_AMBULATORY_CARE_PROVIDER_SITE_OTHER)
Admission: RE | Admit: 2018-09-06 | Discharge: 2018-09-06 | Disposition: A | Discharge status: Home / Self Care | Payer: Medicare Other | Source: Ambulatory Visit | Attending: Internal Medicine | Admitting: Internal Medicine

## 2018-09-06 ENCOUNTER — Other Ambulatory Visit (INDEPENDENT_AMBULATORY_CARE_PROVIDER_SITE_OTHER): Payer: Medicare Other

## 2018-09-06 VITALS — BP 118/82 | HR 89 | Temp 97.8°F | Resp 16 | Ht 61.0 in | Wt 106.4 lb

## 2018-09-06 DIAGNOSIS — I493 Ventricular premature depolarization: Secondary | ICD-10-CM | POA: Diagnosis not present

## 2018-09-06 DIAGNOSIS — E785 Hyperlipidemia, unspecified: Secondary | ICD-10-CM

## 2018-09-06 DIAGNOSIS — R059 Cough, unspecified: Secondary | ICD-10-CM

## 2018-09-06 DIAGNOSIS — R1319 Other dysphagia: Secondary | ICD-10-CM

## 2018-09-06 DIAGNOSIS — R05 Cough: Secondary | ICD-10-CM

## 2018-09-06 DIAGNOSIS — Z23 Encounter for immunization: Secondary | ICD-10-CM | POA: Diagnosis not present

## 2018-09-06 DIAGNOSIS — M810 Age-related osteoporosis without current pathological fracture: Secondary | ICD-10-CM

## 2018-09-06 DIAGNOSIS — Z0000 Encounter for general adult medical examination without abnormal findings: Secondary | ICD-10-CM

## 2018-09-06 LAB — CBC WITH DIFFERENTIAL/PLATELET
BASOS ABS: 0 10*3/uL (ref 0.0–0.1)
BASOS PCT: 0.5 % (ref 0.0–3.0)
Eosinophils Absolute: 0.1 10*3/uL (ref 0.0–0.7)
Eosinophils Relative: 1.5 % (ref 0.0–5.0)
HEMATOCRIT: 40.9 % (ref 36.0–46.0)
Hemoglobin: 13.8 g/dL (ref 12.0–15.0)
LYMPHS ABS: 2.4 10*3/uL (ref 0.7–4.0)
LYMPHS PCT: 39.7 % (ref 12.0–46.0)
MCHC: 33.8 g/dL (ref 30.0–36.0)
MCV: 93.9 fl (ref 78.0–100.0)
MONOS PCT: 9.7 % (ref 3.0–12.0)
Monocytes Absolute: 0.6 10*3/uL (ref 0.1–1.0)
NEUTROS ABS: 3 10*3/uL (ref 1.4–7.7)
NEUTROS PCT: 48.6 % (ref 43.0–77.0)
PLATELETS: 278 10*3/uL (ref 150.0–400.0)
RBC: 4.36 Mil/uL (ref 3.87–5.11)
RDW: 13.8 % (ref 11.5–15.5)
WBC: 6.2 10*3/uL (ref 4.0–10.5)

## 2018-09-06 LAB — COMPREHENSIVE METABOLIC PANEL
ALBUMIN: 4.1 g/dL (ref 3.5–5.2)
ALT: 12 U/L (ref 0–35)
AST: 19 U/L (ref 0–37)
Alkaline Phosphatase: 48 U/L (ref 39–117)
BUN: 23 mg/dL (ref 6–23)
CHLORIDE: 103 meq/L (ref 96–112)
CO2: 31 mEq/L (ref 19–32)
Calcium: 9.6 mg/dL (ref 8.4–10.5)
Creatinine, Ser: 1.06 mg/dL (ref 0.40–1.20)
GFR: 52.13 mL/min — ABNORMAL LOW (ref >60.00)
Glucose, Bld: 98 mg/dL (ref 70–99)
POTASSIUM: 4.5 meq/L (ref 3.5–5.1)
SODIUM: 142 meq/L (ref 135–145)
Total Bilirubin: 0.6 mg/dL (ref 0.2–1.2)
Total Protein: 7.1 g/dL (ref 6.0–8.3)

## 2018-09-06 LAB — LIPID PANEL
CHOL/HDL RATIO: 3
CHOLESTEROL: 202 mg/dL — AB (ref 0–200)
HDL: 72.2 mg/dL (ref >39.00)
LDL CALC: 114 mg/dL — AB (ref 0–99)
NonHDL: 130.06
Triglycerides: 82 mg/dL (ref 0.0–149.0)
VLDL: 16.4 mg/dL (ref 0.0–40.0)

## 2018-09-06 LAB — TSH: TSH: 0.91 u[IU]/mL (ref 0.35–4.50)

## 2018-09-06 NOTE — Patient Instructions (Addendum)
  Michelle Vazquez , Thank you for taking time to come for your Medicare Wellness Visit. I appreciate your ongoing commitment to your health goals. Please review the following plan we discussed and let me know if I can assist you in the future.   These are the goals we discussed: Goals   None     This is a list of the screening recommended for you and due dates:  Health Maintenance  Topic Date Due  . Flu Shot  Given today  . DEXA scan (bone density measurement)  11/06/2028*  . Tetanus Vaccine  11/07/2019  . Pneumonia vaccines  Completed  *Topic was postponed. The date shown is not the original due date.      Tests ordered today. Your results will be released to [place] (or called to you) after review, usually within 72hours after test completion. If any changes need to be made, you will be notified at that same time.  All other Health Maintenance issues reviewed.   All recommended immunizations and age-appropriate screenings are up-to-date or discussed.  Flu immunization administered today.    Medications reviewed and updated.  Changes include :   none   Please followup in one year

## 2018-09-06 NOTE — Assessment & Plan Note (Signed)
Significant symptoms Weight stable - limited in what she can eat and can only eat a small amount at a time Drinking boost Wonders about a second opinion - she will think about it

## 2018-09-06 NOTE — Assessment & Plan Note (Signed)
Taking MVI Does not want to take any medications for OP Deferred dexa

## 2018-09-06 NOTE — Assessment & Plan Note (Signed)
Lipids, cmp Not on medication

## 2018-09-06 NOTE — Assessment & Plan Note (Signed)
Following with Dr Lovena Le Bisoprolol and flecainide Continue above

## 2018-09-06 NOTE — Assessment & Plan Note (Signed)
Cough related to esophageal dysfunction She is concerned about a possible infection CXR

## 2018-09-20 ENCOUNTER — Other Ambulatory Visit: Payer: Self-pay

## 2018-09-24 ENCOUNTER — Telehealth: Payer: Self-pay

## 2018-09-24 NOTE — Telephone Encounter (Signed)
Copied from [place] [id number]. Topic: General - Other >> Sep 24, 2018 10:27 AM Yvette Rack wrote: Reason for CRM: pt calling back pertaining to letter she received in the mail she has a question about her COPD she states that the letter didn't mention anything about that on her xray please give pt a call back at [phone]

## 2018-09-24 NOTE — Telephone Encounter (Signed)
A chest xray is not how we diagnose either of these and is often wrong.  It would require other tests to know if she has either.  If she is concerned and wants further explanation or testing she should come in for an office visit

## 2018-09-24 NOTE — Telephone Encounter (Signed)
Spoke with pt to get more clarification. She states she saw the report online and noticed it said something about COPD and cardiomegaly which she states she has never been told. She wanted to make sure she does not have COPD and does not need to worry about the cardiomegaly. Please advise.

## 2018-09-25 NOTE — Telephone Encounter (Signed)
Pt aware of response. States she is seeing cardiology soon so will talk with them about the issue.

## 2018-10-24 ENCOUNTER — Other Ambulatory Visit: Payer: Self-pay | Admitting: Internal Medicine

## 2018-10-24 DIAGNOSIS — I493 Ventricular premature depolarization: Secondary | ICD-10-CM

## 2018-10-25 ENCOUNTER — Telehealth: Payer: Self-pay | Admitting: *Deleted

## 2018-10-25 NOTE — Telephone Encounter (Signed)
Per device clinic move appt to AM. Lmptcb [phone]

## 2018-10-26 ENCOUNTER — Other Ambulatory Visit: Payer: Self-pay | Admitting: Internal Medicine

## 2018-10-26 DIAGNOSIS — I493 Ventricular premature depolarization: Secondary | ICD-10-CM

## 2018-11-12 ENCOUNTER — Ambulatory Visit (INDEPENDENT_AMBULATORY_CARE_PROVIDER_SITE_OTHER): Payer: Medicare Other | Admitting: Internal Medicine

## 2018-11-12 ENCOUNTER — Encounter: Payer: Self-pay | Admitting: Internal Medicine

## 2018-11-12 DIAGNOSIS — I493 Ventricular premature depolarization: Secondary | ICD-10-CM

## 2018-11-12 MED ORDER — BISOPROLOL FUMARATE 5 MG PO TABS: ORAL_TABLET | ORAL | 0 refills | Status: DC

## 2018-11-12 MED ORDER — FLECAINIDE ACETATE 50 MG PO TABS: 50.0000 mg | ORAL_TABLET | Freq: Two times a day (BID) | ORAL | 3 refills | Status: DC

## 2018-11-12 MED ORDER — BISOPROLOL FUMARATE 5 MG PO TABS: ORAL_TABLET | ORAL | 3 refills | Status: DC

## 2018-11-12 NOTE — Patient Instructions (Signed)

## 2018-11-12 NOTE — Progress Notes (Signed)
HPI Michelle Vazquez returns today for followup of her PVC's and HTN. She is a pleasant 85 yo woman with a h/o palpitations who has been placed on flecainide. Her PVC's have resolved. She has c/o difficulty swallowing in the interim. 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  Allergies  Allergen Reactions  . Diltiazem Hcl Other (See Comments)    Rapid heartbeat and elevated blood pressure  . Inderal [Propranolol Hcl]     Pt does not remember reaction  . Lopressor [Metoprolol Tartrate]     Per the pt' "I felt like I was going to pass out"  . Penicillins Diarrhea     Current Outpatient Medications  Medication Sig Dispense Refill  . bisoprolol (ZEBETA) 5 MG tablet TAKE 1/2 (ONE-HALF) TABLET BY MOUTH ONCE DAILY. Please keep upcoming appt in January with Dr.Nakoa Ganus for future refills. Thank you 45 tablet 0  . calcium gluconate 500 MG tablet Take 500 mg by mouth daily.      . flecainide (TAMBOCOR) 50 MG tablet Take 1 tablet (50 mg total) by mouth 2 (two) times daily. 180 tablet 3  . Multiple Vitamin (MULTIVITAMIN WITH MINERALS) TABS tablet Take 1 tablet by mouth daily.     No current facility-administered medications for this visit.      Past Medical History:  Diagnosis Date  . Arthritis   . Atrial fibrillation ([place])   . Bowel obstruction ([place]) 2000  . Cataract    bilateral removed  . HLD (hyperlipidemia)   . Hypercontractile esophagus   . Mitral valve prolapse   . Osteoporosis    declines rx and declines DEXA f/u  . Parathyroid adenoma    post parathyroid surgery in October of 2008  . Paroxysmal supraventricular tachycardia ([place])   . Premature ventricular contractions (PVCs) (VPCs)   . Schatzki's ring 08/2013   Dr. Hilarie Fredrickson, s/p dilation 08/2013, ?benefit  . Squamous cell carcinoma in situ of skin    face  . Urinary incontinence     ROS:   All systems reviewed and negative except as noted in the HPI.   Past Surgical History:  Procedure Laterality Date  . ABDOMINAL  HYSTERECTOMY  1989  . BREAST BIOPSY  1979   left  . CATARACT EXTRACTION     bilateral  . ESOPHAGEAL MANOMETRY N/A 11/17/2015   Procedure: ESOPHAGEAL MANOMETRY (EM);  Surgeon: Jerene Bears, MD;  Location: [facility];  Service: Gastroenterology;  Laterality: N/A;  . LAPAROSCOPIC SMALL BOWEL RESECTION  2000  . parathyroid surgery  2008?   left  . SHOULDER SURGERY  02/1999   right  . SKIN LESION EXCISION  08/1999   left side of face     Family History  Problem Relation Age of Onset  . Arthritis Father   . Heart disease Father   . Hypertension Father   . Stroke Father   . Heart attack Father   . Arthritis Mother   . Stroke Mother   . Stroke Brother   . Diabetes Brother   . Hypertension Brother   . Colon cancer Sister        in her 93's  . Hyperlipidemia Sister   . Hypertension Sister   . Hypertension Sister   . Diabetes Sister   . Esophageal cancer Neg Hx   . Rectal cancer Neg Hx   . Stomach cancer Neg Hx      Social History   Socioeconomic History  . Marital status: Married    Spouse  name: Not on file  . Number of children: 4  . Years of education: 12+  . Highest education level: Not on file  Occupational History  . Occupation: Retired    Comment: Research officer, political party   Social Needs  . Financial resource strain: Not on file  . Food insecurity:    Worry: Not on file    Inability: Not on file  . Transportation needs:    Medical: Not on file    Non-medical: Not on file  Tobacco Use  . Smoking status: Never Smoker  . Smokeless tobacco: Never Used  Substance and Sexual Activity  . Alcohol use: No  . Drug use: No  . Sexual activity: Not Currently  Lifestyle  . Physical activity:    Days per week: Not on file    Minutes per session: Not on file  . Stress: Not on file  Relationships  . Social connections:    Talks on phone: Not on file    Gets together: Not on file    Attends religious service: Not on file    Active member of club or organization: Not on  file    Attends meetings of clubs or organizations: Not on file    Relationship status: Not on file  . Intimate partner violence:    Fear of current or ex partner: Not on file    Emotionally abused: Not on file    Physically abused: Not on file    Forced sexual activity: Not on file  Other Topics Concern  . 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 PVC's - she is quiet on her flecainide. No change. 2. HTN - her blood pressure is controlled. We will follow.  Mikle Bosworth.D.

## 2018-11-18 DIAGNOSIS — Z961 Presence of intraocular lens: Secondary | ICD-10-CM | POA: Diagnosis not present

## 2018-11-18 DIAGNOSIS — H1851 Endothelial corneal dystrophy: Secondary | ICD-10-CM | POA: Diagnosis not present

## 2018-11-18 DIAGNOSIS — H35372 Puckering of macula, left eye: Secondary | ICD-10-CM | POA: Diagnosis not present

## 2018-11-18 DIAGNOSIS — D3131 Benign neoplasm of right choroid: Secondary | ICD-10-CM | POA: Diagnosis not present

## 2019-04-30 DIAGNOSIS — D2271 Melanocytic nevi of right lower limb, including hip: Secondary | ICD-10-CM | POA: Diagnosis not present

## 2019-04-30 DIAGNOSIS — Z85828 Personal history of other malignant neoplasm of skin: Secondary | ICD-10-CM | POA: Diagnosis not present

## 2019-04-30 DIAGNOSIS — L570 Actinic keratosis: Secondary | ICD-10-CM | POA: Diagnosis not present

## 2019-04-30 DIAGNOSIS — D692 Other nonthrombocytopenic purpura: Secondary | ICD-10-CM | POA: Diagnosis not present

## 2019-04-30 DIAGNOSIS — L821 Other seborrheic keratosis: Secondary | ICD-10-CM | POA: Diagnosis not present

## 2019-04-30 DIAGNOSIS — D1801 Hemangioma of skin and subcutaneous tissue: Secondary | ICD-10-CM | POA: Diagnosis not present

## 2019-07-08 ENCOUNTER — Other Ambulatory Visit: Payer: Self-pay

## 2019-07-08 ENCOUNTER — Ambulatory Visit (INDEPENDENT_AMBULATORY_CARE_PROVIDER_SITE_OTHER): Payer: Medicare Other | Admitting: Internal Medicine

## 2019-07-08 ENCOUNTER — Encounter: Payer: Self-pay | Admitting: Internal Medicine

## 2019-07-08 ENCOUNTER — Ambulatory Visit: Payer: Self-pay

## 2019-07-08 VITALS — BP 126/68 | HR 71 | Temp 98.0°F | Resp 16 | Ht 61.0 in | Wt 107.8 lb

## 2019-07-08 DIAGNOSIS — R2242 Localized swelling, mass and lump, left lower limb: Secondary | ICD-10-CM | POA: Diagnosis not present

## 2019-07-08 DIAGNOSIS — R1319 Other dysphagia: Secondary | ICD-10-CM | POA: Diagnosis not present

## 2019-07-08 NOTE — Patient Instructions (Addendum)
An Ultrasound of your left leg will be ordered - we will call you with the results.    A referral was ordered for GI.

## 2019-07-08 NOTE — Telephone Encounter (Signed)
Scheduled

## 2019-07-08 NOTE — Assessment & Plan Note (Signed)
Swelling of left lower extremity for unknown reason No pain Family history of DVT Possible DVT versus rupture of a Baker's cyst We will order an ultrasound left lower extremity rule out DVT Treatment depending on above

## 2019-07-08 NOTE — Assessment & Plan Note (Signed)
Related to jackhammer esophagus-dysmotility and hypercontractile esophagus Dysphagia has gotten worse Would like a second opinion regarding treatment-we will refer to GI

## 2019-07-08 NOTE — Progress Notes (Signed)
Subjective:    Patient ID: Michelle Vazquez, female    DOB: 11/18/1928, 85 y.o.   MRN: [id number]  HPI The patient is here for an acute visit.   Late yesterday evening she had a funny feeling in her left calf.  It was almost like a pulling sensation.  Later she had left lower leg swelling - it went down a little over night.  She denies any h/o leg or ankle swelling.  She denies left knee or ankle pain.  She denies known arthritis in her knees or ankles.  She denies doing any unusual activities or having any injuries.  She denies any pain in the left lower leg.  She has not had any numbness or tingling.  She has not had a personal history of a blood clot, but 2 sisters have had blood clots.  One sister had a blood clot a couple of months ago just after being diagnosed with cancer.  Her other sister had a blood clot and she is unsure if there was a cause.  She does notice that she had woken up with it.  She is fairly active around the house doing housework and yard work.  She denies any prolong period of being sedentary recently.  She denies long car rides, plane rides.  She denies any fevers, chest pain, palpitation or shortness of breath.     She continues to struggle with dysphasia.  She had thought about seeing a another gastroenterologist for second opinion regarding treatment of her jackhammer esophagus, but the COVID pandemic started and she did not want to see anyone at that time.  Her dysphasia has worsened and she think she needs to see someone at this time.  Medications and allergies reviewed with patient and updated if appropriate.  Patient Active Problem List   Diagnosis Date Noted  . Cough 09/04/2017  . Family history of diabetes mellitus (DM) 08/28/2016  . Osteoporosis   . Dysphagia 08/28/2013  . Arthritis   . HLD (hyperlipidemia)   . 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 Mitral valve prolapse 06/21/2011  . Parathyroid adenoma 06/21/2011    Current Outpatient Medications on  File Prior to Visit  Medication Sig Dispense Refill  . bisoprolol (ZEBETA) 5 MG tablet TAKE 1/2 (ONE-HALF) TABLET BY MOUTH ONCE DAILY. 45 tablet 3  . calcium gluconate 500 MG tablet Take 500 mg by mouth daily.      . flecainide (TAMBOCOR) 50 MG tablet Take 1 tablet (50 mg total) by mouth 2 (two) times daily. 180 tablet 3  . Multiple Vitamin (MULTIVITAMIN WITH MINERALS) TABS tablet Take 1 tablet by mouth daily.     No current facility-administered medications on file prior to visit.     Past Medical History:  Diagnosis Date  . Arthritis   . Atrial fibrillation ([place])   . Bowel obstruction ([place]) 2000  . Cataract    bilateral removed  . HLD (hyperlipidemia)   . Hypercontractile esophagus   . Mitral valve prolapse   . Osteoporosis    declines rx and declines DEXA f/u  . Parathyroid adenoma    post parathyroid surgery in October of 2008  . Paroxysmal supraventricular tachycardia ([place])   . Premature ventricular contractions (PVCs) (VPCs)   . Schatzki's ring 08/2013   Dr. Hilarie Fredrickson, s/p dilation 08/2013, ?benefit  . Squamous cell carcinoma in situ of skin    face  . Urinary incontinence     Past Surgical History:  Procedure Laterality Date  . ABDOMINAL  HYSTERECTOMY  1989  . BREAST BIOPSY  1979   left  . CATARACT EXTRACTION     bilateral  . ESOPHAGEAL MANOMETRY N/A 11/17/2015   Procedure: ESOPHAGEAL MANOMETRY (EM);  Surgeon: Jerene Bears, MD;  Location: [facility];  Service: Gastroenterology;  Laterality: N/A;  . LAPAROSCOPIC SMALL BOWEL RESECTION  2000  . parathyroid surgery  2008?   left  . SHOULDER SURGERY  02/1999   right  . SKIN LESION EXCISION  08/1999   left side of face    Social History   Socioeconomic History  . Marital status: Married    Spouse name: Not on file  . Number of children: 4  . Years of education: 12+  . Highest education level: Not on file  Occupational History  . Occupation: Retired    Comment: Research officer, political party   Social Needs  . Financial  resource strain: Not on file  . Food insecurity    Worry: Not on file    Inability: Not on file  . Transportation needs    Medical: Not on file    Non-medical: Not on file  Tobacco Use  . Smoking status: Never Smoker  . Smokeless tobacco: Never Used  Substance and Sexual Activity  . Alcohol use: No  . Drug use: No  . Sexual activity: Not Currently  Lifestyle  . Physical activity    Days per week: Not on file    Minutes per session: Not on file  . Stress: Not on file  Relationships  . Social Herbalist on phone: Not on file    Gets together: Not on file    Attends religious service: Not on file    Active member of club or organization: Not on file    Attends meetings of clubs or organizations: Not on file    Relationship status: Not on file  Other Topics Concern  . 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 Arthritis Father   . Heart disease Father   . Hypertension Father   . Stroke Father   . Heart attack Father   . Arthritis Mother   . Stroke Mother   . Stroke Brother   . Diabetes Brother   . Hypertension Brother   . Colon cancer Sister        in her 18's  . Hyperlipidemia Sister   . Hypertension Sister   . Hypertension Sister   . Diabetes Sister   . Esophageal cancer Neg Hx   . Rectal cancer Neg Hx   . Stomach cancer Neg Hx     Review of Systems  Constitutional: Negative for chills and fever.  Respiratory: Positive for cough (chronic - no change). Negative for shortness of breath and wheezing.   Cardiovascular: Negative for chest pain and palpitations.  Musculoskeletal: Positive for joint swelling (left ankle swelling/ lower leg).  Neurological: Negative for numbness.       Objective:   Vitals:   07/08/19 1443  BP: 126/68  Pulse: 71  Resp: 16  Temp: 98 F  (36.7 C)  SpO2: 98%   BP Readings from Last 3 Encounters:  07/08/19 126/68  11/12/18 122/62  09/06/18 118/82   Wt Readings from Last 3 Encounters:  07/08/19 107 lb 12.8 oz (48.9 kg)  11/12/18  105 lb (47.6 kg)  09/06/18 106 lb 6.4 oz (48.3 kg)   Body mass index is 20.37 kg/m.   Physical Exam Constitutional:      General: She is not in acute distress.    Appearance: Normal appearance. She is not ill-appearing.  HENT:     Head: Normocephalic and atraumatic.  Musculoskeletal:     Comments: Left ankle mild edema, no calf swelling.  No pain with palpation left calf, no pain with palpation posterior left knee.  Normal sensation left lower extremity.  DP and posterior tibial pulses palpable.  No pain with movement of the knee or ankle.  Skin:    General: Skin is warm and dry.     Findings: No erythema or rash.  Neurological:     Mental Status: She is alert.            Assessment & Plan:    See Problem List for Assessment and Plan of chronic medical problems.

## 2019-07-08 NOTE — Telephone Encounter (Signed)
Incoming call from Patient who state her left leg down to ankle is swollen.  Stared swelling last night.Patient was transferred to office location to make an appoitnment.          Reason for Disposition . [1] Mild lip swelling from food reaction AND [2] diagnosis never confirmed by a HCP  Answer Assessment - Initial Assessment Questions 1. ONSET: "When did the swelling start?" (e.g., minutes, hours, days)   Started yesterday evening.   2. SEVERITY: "How swollen is it?"     moderate 3. ITCHING: "Is there any itching?" If so, ask: "How much?"   (Scale 1-10; mild, moderate or severe)    denies 4. PAIN: "Is the swelling painful to touch?" If so, ask: "How painful is it?"   (Scale 1-10; mild, moderate or severe)    denies 5. CAUSE: "What do you think is causing the lip swelling?"    6. RECURRENT SYMPTOM: "Have you had lip swelling before?" If so, ask: "When was the last time?" "What happened that time?"   denies 7. OTHER SYMPTOMS: "Do you have any other symptoms?" (e.g., toothache)     8. PREGNANCY: "Is there any chance you are pregnant?" "When was your last menstrual period?"    na  Protocols used: [facility]

## 2019-07-09 ENCOUNTER — Telehealth: Payer: Self-pay

## 2019-07-09 ENCOUNTER — Ambulatory Visit (HOSPITAL_COMMUNITY)
Admission: RE | Admit: 2019-07-09 | Payer: Medicare Other | Source: Ambulatory Visit | Attending: Internal Medicine | Admitting: Internal Medicine

## 2019-07-09 NOTE — Telephone Encounter (Signed)
Pt aware of response.  

## 2019-07-09 NOTE — Telephone Encounter (Signed)
Copied from [place] [id number]. Topic: General - Other >> Jul 09, 2019  9:41 AM Celene Kras A wrote: Reason for CRM: Pt called stating the swelling in her ankle has went down. Pt states she would like to cancel the ultrasound for ankle today at 3pm. Pt states they would not cancel the appt without permission from PCP. Pt is requesting a call back. Please advise.

## 2019-07-09 NOTE — Telephone Encounter (Signed)
Ok to cancel - she is low risk

## 2019-08-06 ENCOUNTER — Other Ambulatory Visit: Payer: Self-pay | Admitting: Gastroenterology

## 2019-08-06 DIAGNOSIS — R1310 Dysphagia, unspecified: Secondary | ICD-10-CM

## 2019-08-06 DIAGNOSIS — I480 Paroxysmal atrial fibrillation: Secondary | ICD-10-CM | POA: Diagnosis not present

## 2019-08-06 DIAGNOSIS — K224 Dyskinesia of esophagus: Secondary | ICD-10-CM | POA: Diagnosis not present

## 2019-08-11 ENCOUNTER — Ambulatory Visit
Admission: RE | Admit: 2019-08-11 | Discharge: 2019-08-11 | Disposition: A | Discharge status: Home / Self Care | Payer: Medicare Other | Source: Ambulatory Visit | Attending: Gastroenterology | Admitting: Gastroenterology

## 2019-08-11 DIAGNOSIS — R1310 Dysphagia, unspecified: Secondary | ICD-10-CM

## 2019-08-11 DIAGNOSIS — K222 Esophageal obstruction: Secondary | ICD-10-CM | POA: Diagnosis not present

## 2019-08-11 DIAGNOSIS — K224 Dyskinesia of esophagus: Secondary | ICD-10-CM | POA: Diagnosis not present

## 2019-08-14 ENCOUNTER — Telehealth: Payer: Self-pay | Admitting: *Deleted

## 2019-08-14 NOTE — Telephone Encounter (Signed)
Copied from [place] [phone]. Topic: General - Other >> Aug 13, 2019  3:48 PM Keene Breath wrote: Reason for CRM: Patient called to ask the nurse to call her to give her the name of an ENT that she saw a few years ago.  Please advise and call patient back to discuss.  CB#  [phone]

## 2019-08-14 NOTE — Telephone Encounter (Signed)
I called pt- she has now figured the name of her ENT, Dr. Wilburn Cornelia. She states nothing further is needed from [place] at this time and thanked me for calling.

## 2019-09-01 DIAGNOSIS — K222 Esophageal obstruction: Secondary | ICD-10-CM | POA: Insufficient documentation

## 2019-09-01 DIAGNOSIS — R1314 Dysphagia, pharyngoesophageal phase: Secondary | ICD-10-CM | POA: Diagnosis not present

## 2019-09-04 ENCOUNTER — Telehealth: Payer: Self-pay | Admitting: *Deleted

## 2019-09-04 NOTE — Telephone Encounter (Signed)
   [facility] Pre-operative Risk Assessment    Request for surgical clearance:  1. What type of surgery is being performed? DILATION OF ESOPHAGUS    2. When is this surgery scheduled? 09/17/19   3. What type of clearance is required (medical clearance vs. Pharmacy clearance to hold med vs. Both)? MEDICAL  4. Are there any medications that need to be held prior to surgery and how long? NONE LISTED    5. Practice name and name of physician performing surgery? [facility]; [facility]NT ; DR. Wilburn Cornelia   6. What is your office phone number [phone]    7.   What is your office fax number [phone]  8.   Anesthesia type (None, local, MAC, general) ? GENERAL   Michelle Vazquez 09/04/2019, 2:03 PM  _________________________________________________________________   (provider comments below)

## 2019-09-04 NOTE — Telephone Encounter (Signed)
   Primary Cardiologist: Dr. Lovena Le  Chart reviewed as part of pre-operative protocol coverage. Patient was contacted 09/04/2019 in reference to pre-operative risk assessment for pending surgery as outlined below.  Michelle Vazquez was last seen on 11/12/2018 by Dr. Lovena Le.  Since that day, Michelle Vazquez has done well without chest pain or shortness of breath. PVCs are well controlled on bisoprolol and flecainide.   Therefore, based on ACC/AHA guidelines, the patient would be at acceptable risk for the planned procedure without further cardiovascular testing.   I will route this recommendation to the requesting party via Epic fax function and remove from pre-op pool.  Please call with questions.  [place], [place] 09/04/2019, 5:15 PM

## 2019-09-07 DIAGNOSIS — I10 Essential (primary) hypertension: Secondary | ICD-10-CM | POA: Insufficient documentation

## 2019-09-07 NOTE — Patient Instructions (Addendum)
  Tests ordered today. Your results will be released to [place] (or called to you) after review.  If any changes need to be made, you will be notified at that same time.  All other Health Maintenance issues reviewed.   All recommended immunizations and age-appropriate screenings are up-to-date or discussed.  Flu immunization administered today.    Medications reviewed and updated.  Changes include :   none   Please followup in 1 year

## 2019-09-07 NOTE — Progress Notes (Signed)
Subjective:    Patient ID: Michelle Vazquez, female    DOB: 21-Nov-1928, 85 y.o.   MRN: [id number]  HPI The patient is here for follow up.  She is exercising regularly - house work, yard work, occasional exercise videos.    PVC's, hypertension:  She follows with Dr Lovena Le.  She takes bisoprolol and flecainide.  She denies palpitations.  She denies SOB, chest pain and lightheadedness.  Jackhammer esophagus, dysphagia:  She had a second opinion by a different gastroenterologist. She will have a microlaryngoscopy with dilation with cervical esophagoscopy on 11/11 with Dr Wilburn Cornelia.    Osteoporosis:  She is not interested in having any treatment for her bones and therefore does not want a dexa.  She takes a MVI daily.   Hyperlipidemia: She is not  taking medication - controlled with lifestyle. She is compliant with a low fat/cholesterol diet.       Medications and allergies reviewed with patient and updated if appropriate.  Patient Active Problem List   Diagnosis Date Noted  . Hypertension 09/07/2019  . Localized swelling of left lower leg 07/08/2019  . Cough 09/04/2017  . Family history of diabetes mellitus (DM) 08/28/2016  . Osteoporosis   . Dysphagia 08/28/2013  . Arthritis   . HLD (hyperlipidemia)   . 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 Mitral valve prolapse 06/21/2011  . Parathyroid adenoma 06/21/2011    Current Outpatient Medications on File Prior to Visit  Medication Sig Dispense Refill  . bisoprolol (ZEBETA) 5 MG tablet TAKE 1/2 (ONE-HALF) TABLET BY MOUTH ONCE DAILY. 45 tablet 3  . calcium gluconate 500 MG tablet Take 500 mg by mouth daily.      . flecainide (TAMBOCOR) 50 MG tablet Take 1 tablet (50 mg total) by mouth 2 (two) times daily. 180 tablet 3  . Multiple Vitamin (MULTIVITAMIN WITH MINERALS) TABS tablet Take 1 tablet by mouth daily.     No current facility-administered medications on file prior to visit.     Past Medical History:  Diagnosis Date  . Arthritis   .  Atrial fibrillation ([place])   . Bowel obstruction ([place]) 2000  . Cataract    bilateral removed  . HLD (hyperlipidemia)   . Hypercontractile esophagus   . Mitral valve prolapse   . Osteoporosis    declines rx and declines DEXA f/u  . Parathyroid adenoma    post parathyroid surgery in October of 2008  . Paroxysmal supraventricular tachycardia ([place])   . Premature ventricular contractions (PVCs) (VPCs)   . Schatzki's ring 08/2013   Dr. Hilarie Fredrickson, s/p dilation 08/2013, ?benefit  . Squamous cell carcinoma in situ of skin    face  . Urinary incontinence     Past Surgical History:  Procedure Laterality Date  . ABDOMINAL HYSTERECTOMY  1989  . BREAST BIOPSY  1979   left  . CATARACT EXTRACTION     bilateral  . ESOPHAGEAL MANOMETRY N/A 11/17/2015   Procedure: ESOPHAGEAL MANOMETRY (EM);  Surgeon: Jerene Bears, MD;  Location: [facility];  Service: Gastroenterology;  Laterality: N/A;  . LAPAROSCOPIC SMALL BOWEL RESECTION  2000  . parathyroid surgery  2008?   left  . SHOULDER SURGERY  02/1999   right  . SKIN LESION EXCISION  08/1999   left side of face    Social History   Socioeconomic History  . Marital status: Married    Spouse name: Not on file  . Number of children: 4  . Years of education: 12+  . Highest  education level: Not on file  Occupational History  . Occupation: Retired    Comment: Research officer, political party   Social Needs  . Financial resource strain: Not on file  . Food insecurity    Worry: Not on file    Inability: Not on file  . Transportation needs    Medical: Not on file    Non-medical: Not on file  Tobacco Use  . Smoking status: Never Smoker  . Smokeless tobacco: Never Used  Substance and Sexual Activity  . Alcohol use: No  . Drug use: No  . Sexual activity: Not Currently  Lifestyle  . Physical activity    Days per week: Not on file    Minutes per session: Not on file  . Stress: Not on file  Relationships  . Social Herbalist on phone: Not on file     Gets together: Not on file    Attends religious service: Not on file    Active member of club or organization: Not on file    Attends meetings of clubs or organizations: Not on file    Relationship status: Not on file  Other Topics Concern  . 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 Arthritis Father   . Heart disease Father   . Hypertension Father   . Stroke Father   . Heart attack Father   . Arthritis Mother   . Stroke Mother   . Stroke Brother   . Diabetes Brother   . Hypertension Brother   . Colon cancer Sister        in her 23's  . Hyperlipidemia Sister   . Hypertension Sister   . Hypertension Sister   . Diabetes Sister   . Esophageal cancer Neg Hx   . Rectal cancer Neg Hx   . Stomach cancer Neg Hx     Review of Systems  Constitutional: Negative for chills and fever.  HENT: Positive for trouble swallowing.   Respiratory: Positive for cough (chronic - due to esophagus). Negative for shortness of breath and wheezing.   Cardiovascular: Negative for chest pain, palpitations and leg swelling.  Gastrointestinal: Negative for abdominal pain, blood in stool, constipation and diarrhea.  Musculoskeletal: Positive for arthralgias (arthritis).  Neurological: Negative for light-headedness and headaches.  Psychiatric/Behavioral: Negative for dysphoric mood. The patient is not nervous/anxious.        Objective:   Vitals:   09/08/19 0756  BP: (!) 146/78  Pulse: 76  Resp: 16  Temp: 98 F (36.7 C)  SpO2: 97%   BP Readings from Last 3 Encounters:  09/08/19 (!) 146/78  07/08/19 126/68  11/12/18 122/62   Wt Readings from Last 3 Encounters:  09/08/19 105 lb (47.6 kg)  07/08/19 107 lb 12.8 oz (48.9 kg)  11/12/18 105 lb (47.6 kg)   Body mass index is 19.84 kg/m.   Physical Exam     Constitutional: Appears well-developed and well-nourished. No distress.  HENT:  Head: Normocephalic and atraumatic.  Neck: Neck supple. No tracheal deviation present. No thyromegaly present.  No cervical lymphadenopathy Cardiovascular: Normal rate, regular rhythm and normal heart sounds.   No murmur heard. No carotid bruit .  No edema Pulmonary/Chest: Effort normal and breath sounds normal. No respiratory distress. No has no wheezes. No rales.  Abdomen: soft, NT, ND Skin: Skin is warm and dry. Not diaphoretic.  Psychiatric: Normal mood and affect. Behavior is normal.      Assessment & Plan:    See Problem List for Assessment and Plan of chronic medical problems.

## 2019-09-08 ENCOUNTER — Encounter: Payer: Self-pay | Admitting: Internal Medicine

## 2019-09-08 ENCOUNTER — Ambulatory Visit (INDEPENDENT_AMBULATORY_CARE_PROVIDER_SITE_OTHER): Payer: Medicare Other | Admitting: Internal Medicine

## 2019-09-08 ENCOUNTER — Other Ambulatory Visit: Payer: Self-pay

## 2019-09-08 ENCOUNTER — Other Ambulatory Visit (INDEPENDENT_AMBULATORY_CARE_PROVIDER_SITE_OTHER): Payer: Medicare Other

## 2019-09-08 VITALS — BP 146/78 | HR 76 | Temp 98.0°F | Resp 16 | Ht 61.0 in | Wt 105.0 lb

## 2019-09-08 DIAGNOSIS — R1319 Other dysphagia: Secondary | ICD-10-CM

## 2019-09-08 DIAGNOSIS — I10 Essential (primary) hypertension: Secondary | ICD-10-CM

## 2019-09-08 DIAGNOSIS — E785 Hyperlipidemia, unspecified: Secondary | ICD-10-CM

## 2019-09-08 DIAGNOSIS — M810 Age-related osteoporosis without current pathological fracture: Secondary | ICD-10-CM | POA: Diagnosis not present

## 2019-09-08 DIAGNOSIS — I493 Ventricular premature depolarization: Secondary | ICD-10-CM | POA: Diagnosis not present

## 2019-09-08 DIAGNOSIS — Z23 Encounter for immunization: Secondary | ICD-10-CM

## 2019-09-08 LAB — COMPREHENSIVE METABOLIC PANEL
ALT: 10 U/L (ref 0–35)
AST: 17 U/L (ref 0–37)
Albumin: 4.1 g/dL (ref 3.5–5.2)
Alkaline Phosphatase: 50 U/L (ref 39–117)
BUN: 25 mg/dL — ABNORMAL HIGH (ref 6–23)
CO2: 30 mEq/L (ref 19–32)
Calcium: 9.6 mg/dL (ref 8.4–10.5)
Chloride: 101 mEq/L (ref 96–112)
Creatinine, Ser: 0.94 mg/dL (ref 0.40–1.20)
GFR: 56.21 mL/min — ABNORMAL LOW (ref >60.00)
Glucose, Bld: 101 mg/dL — ABNORMAL HIGH (ref 70–99)
Potassium: 5 mEq/L (ref 3.5–5.1)
Sodium: 138 mEq/L (ref 135–145)
Total Bilirubin: 0.5 mg/dL (ref 0.2–1.2)
Total Protein: 7.2 g/dL (ref 6.0–8.3)

## 2019-09-08 LAB — CBC WITH DIFFERENTIAL/PLATELET
Basophils Absolute: 0 10*3/uL (ref 0.0–0.1)
Basophils Relative: 0.7 % (ref 0.0–3.0)
Eosinophils Absolute: 0.1 10*3/uL (ref 0.0–0.7)
Eosinophils Relative: 1.3 % (ref 0.0–5.0)
HCT: 40.4 % (ref 36.0–46.0)
Hemoglobin: 13.4 g/dL (ref 12.0–15.0)
Lymphocytes Relative: 37.8 % (ref 12.0–46.0)
Lymphs Abs: 2.5 10*3/uL (ref 0.7–4.0)
MCHC: 33.2 g/dL (ref 30.0–36.0)
MCV: 94.5 fl (ref 78.0–100.0)
Monocytes Absolute: 0.6 10*3/uL (ref 0.1–1.0)
Monocytes Relative: 8.7 % (ref 3.0–12.0)
Neutro Abs: 3.4 10*3/uL (ref 1.4–7.7)
Neutrophils Relative %: 51.5 % (ref 43.0–77.0)
Platelets: 271 10*3/uL (ref 150.0–400.0)
RBC: 4.27 Mil/uL (ref 3.87–5.11)
RDW: 13.1 % (ref 11.5–15.5)
WBC: 6.6 10*3/uL (ref 4.0–10.5)

## 2019-09-08 LAB — LIPID PANEL
Cholesterol: 208 mg/dL — ABNORMAL HIGH (ref 0–200)
HDL: 65.8 mg/dL (ref >39.00)
LDL Cholesterol: 125 mg/dL — ABNORMAL HIGH (ref 0–99)
NonHDL: 142.06
Total CHOL/HDL Ratio: 3
Triglycerides: 83 mg/dL (ref 0.0–149.0)
VLDL: 16.6 mg/dL (ref 0.0–40.0)

## 2019-09-08 LAB — TSH: TSH: 1.04 u[IU]/mL (ref 0.35–4.50)

## 2019-09-08 NOTE — Assessment & Plan Note (Signed)
Following with Dr Lovena Le Controlled, no palps On bisoprolol and flecainide Continue Tsh, cbc, cmp

## 2019-09-08 NOTE — Assessment & Plan Note (Signed)
Diet controlled Check lipids, tsh, cmp Healthy diet, continue increased activity/exercise

## 2019-09-08 NOTE — Assessment & Plan Note (Signed)
Dysphagia has gotten worse Appears to have stricture in upper esophagus Will be having that dilated by Dr. Wilburn Cornelia on the 11th She drinks a lot of boost and is very careful when she eats food

## 2019-09-08 NOTE — Assessment & Plan Note (Signed)
Declines dexa and treatment Active - continue Taking MVI daily

## 2019-09-08 NOTE — Assessment & Plan Note (Signed)
BP ok for age Current regimen effective and well tolerated Continue current medications at current doses Cmp, cbc, tsh

## 2019-09-11 ENCOUNTER — Other Ambulatory Visit: Payer: Self-pay | Admitting: Otolaryngology

## 2019-09-12 NOTE — Pre-Procedure Instructions (Signed)
[place], [place] - [id number] [place] [place] [address] Phone: [phone] Fax: [phone]      Your procedure is scheduled on Wednesday November 11th.  Report to [facility] Main Entrance "A" at 6:30 A.M., and check in at the Admitting office.  Call this number if you have problems the morning of surgery:  [phone]  Call [phone] if you have any questions prior to your surgery date Monday-Friday 8am-4pm    Remember:  Do not eat or drink after midnight the night before your surgery    Take these medicines the morning of surgery with A SIP OF WATER  bisoprolol (ZEBETA) flecainide (TAMBOCOR)  As of today, STOP taking any Aspirin (unless otherwise instructed by your surgeon), Aleve, Naproxen, Ibuprofen, Motrin, Advil, Goody's, BC's, all herbal medications, fish oil, and all vitamins.    The Morning of Surgery  Do not wear jewelry, make-up or nail polish.  Do not wear lotions, powders, or perfumes/colognes, or deodorant  Do not shave 48 hours prior to surgery.  Men may shave face and neck.  Do not bring valuables to the hospital.  [facility] is not responsible for any belongings or valuables.  If you are a smoker, DO NOT Smoke 24 hours prior to surgery IF you wear a CPAP at night please bring your mask, tubing, and machine the morning of surgery   Remember that you must have someone to transport you home after your surgery, and remain with you for 24 hours if you are discharged the same day.   Contacts, glasses, hearing aids, dentures or bridgework may not be worn into surgery.    Leave your suitcase in the car.  After surgery it may be brought to your room.  For patients admitted to the hospital, discharge time will be determined by your treatment team.  Patients discharged the day of surgery will not be allowed to drive home.    Special instructions:   [place]- Preparing For Surgery  Before  surgery, you can play an important role. Because skin is not sterile, your skin needs to be as free of germs as possible. You can reduce the number of germs on your skin by washing with CHG (chlorahexidine gluconate) Soap before surgery.  CHG is an antiseptic cleaner which kills germs and bonds with the skin to continue killing germs even after washing.    Oral Hygiene is also important to reduce your risk of infection.  Remember - BRUSH YOUR TEETH THE MORNING OF SURGERY WITH YOUR REGULAR TOOTHPASTE  Please do not use if you have an allergy to CHG or antibacterial soaps. If your skin becomes reddened/irritated stop using the CHG.  Do not shave (including legs and underarms) for at least 48 hours prior to first CHG shower. It is OK to shave your face.  Please follow these instructions carefully.   1. Shower the NIGHT BEFORE SURGERY and the MORNING OF SURGERY with CHG Soap.   2. If you chose to wash your hair, wash your hair first as usual with your normal shampoo.  3. After you shampoo, rinse your hair and body thoroughly to remove the shampoo.  4. Use CHG as you would any other liquid soap. You can apply CHG directly to the skin and wash gently with a scrungie or a clean washcloth.   5. Apply the CHG Soap to your body ONLY FROM THE NECK DOWN.  Do not use on open wounds or open sores. Avoid  contact with your eyes, ears, mouth and genitals (private parts). Wash Face and genitals (private parts)  with your normal soap.   6. Wash thoroughly, paying special attention to the area where your surgery will be performed.  7. Thoroughly rinse your body with warm water from the neck down.  8. DO NOT shower/wash with your normal soap after using and rinsing off the CHG Soap.  9. Pat yourself dry with a CLEAN TOWEL.  10. Wear CLEAN PAJAMAS to bed the night before surgery, wear comfortable clothes the morning of surgery  11. Place CLEAN SHEETS on your bed the night of your first shower and DO NOT SLEEP  WITH PETS.    Day of Surgery:  Do not apply any deodorants/lotions. Please shower the morning of surgery with the CHG soap  Please wear clean clothes to the hospital/surgery center.   Remember to brush your teeth WITH YOUR REGULAR TOOTHPASTE.   Please read over the following fact sheets that you were given.

## 2019-09-15 ENCOUNTER — Other Ambulatory Visit (HOSPITAL_COMMUNITY)
Admission: RE | Admit: 2019-09-15 | Discharge: 2019-09-15 | Disposition: A | Discharge status: Home / Self Care | Payer: Medicare Other | Source: Ambulatory Visit | Attending: Otolaryngology | Admitting: Otolaryngology

## 2019-09-15 ENCOUNTER — Encounter (HOSPITAL_COMMUNITY): Payer: Self-pay

## 2019-09-15 ENCOUNTER — Other Ambulatory Visit: Payer: Self-pay

## 2019-09-15 ENCOUNTER — Encounter (HOSPITAL_COMMUNITY)
Admission: RE | Admit: 2019-09-15 | Discharge: 2019-09-15 | Disposition: A | Discharge status: Home / Self Care | Payer: Medicare Other | Source: Ambulatory Visit | Attending: Otolaryngology | Admitting: Otolaryngology

## 2019-09-15 DIAGNOSIS — I493 Ventricular premature depolarization: Secondary | ICD-10-CM | POA: Insufficient documentation

## 2019-09-15 DIAGNOSIS — Z01812 Encounter for preprocedural laboratory examination: Secondary | ICD-10-CM | POA: Diagnosis not present

## 2019-09-15 DIAGNOSIS — I10 Essential (primary) hypertension: Secondary | ICD-10-CM | POA: Insufficient documentation

## 2019-09-15 DIAGNOSIS — I340 Nonrheumatic mitral (valve) insufficiency: Secondary | ICD-10-CM | POA: Diagnosis not present

## 2019-09-15 DIAGNOSIS — K222 Esophageal obstruction: Secondary | ICD-10-CM | POA: Diagnosis not present

## 2019-09-15 HISTORY — DX: Other complications of anesthesia, initial encounter: T88.59XA

## 2019-09-15 LAB — CBC
HCT: 39.8 % (ref 36.0–46.0)
Hemoglobin: 12.9 g/dL (ref 12.0–15.0)
MCH: 31.4 pg (ref 26.0–34.0)
MCHC: 32.4 g/dL (ref 30.0–36.0)
MCV: 96.8 fL (ref 80.0–100.0)
Platelets: 269 10*3/uL (ref 150–400)
RBC: 4.11 MIL/uL (ref 3.87–5.11)
RDW: 12.2 % (ref 11.5–15.5)
WBC: 6.5 10*3/uL (ref 4.0–10.5)
nRBC: 0 % (ref 0.0–0.2)

## 2019-09-15 LAB — BASIC METABOLIC PANEL
Anion gap: 10 (ref 5–15)
BUN: 21 mg/dL (ref 8–23)
CO2: 27 mmol/L (ref 22–32)
Calcium: 9.3 mg/dL (ref 8.9–10.3)
Chloride: 101 mmol/L (ref 98–111)
Creatinine, Ser: 1.05 mg/dL — ABNORMAL HIGH (ref 0.44–1.00)
GFR calc Af Amer: 55 mL/min — ABNORMAL LOW (ref >60)
GFR calc non Af Amer: 48 mL/min — ABNORMAL LOW (ref >60)
Glucose, Bld: 88 mg/dL (ref 70–99)
Potassium: 4.7 mmol/L (ref 3.5–5.1)
Sodium: 138 mmol/L (ref 135–145)

## 2019-09-15 NOTE — Progress Notes (Signed)
Anesthesia Chart Review: Follows with cardiology for hx of PVCs and HTN. Cardiac clearance per telephone encounter 09/04/19, "Michelle Vazquez was last seen on 11/12/2018 by Dr. Lovena Le.  Since that day, Michelle Vazquez has done well without chest pain or shortness of breath. PVCs are well controlled on bisoprolol and flecainide. Therefore, based on ACC/AHA guidelines, the patient would be at acceptable risk for the planned procedure without further cardiovascular testing."  Significant progressive dysphagia. Barium swallow study on 08/11/2019 showed a significant narrowing in the upper esophagus with dilation of the pharynx.  Preop labs reviewed, WNL.   EKG 11/12/2018: Sinus rhythm with sinus arrhythmia with first-degree AV block.  Rate 84.  Anterolateral infarct, age undetermined.  Holter 10/21/15: 1. NSR 2. Frequent PVC's, mostly monomorphic 3. PAC's with non-sustained atrial tachy 4. No sustained bradycardia or pauses. 5. No sustained atrial or ventricular tachyarrhythmias.  Echo 2014: - Left ventricle: The cavity size was normal. Wall thickness  was normal. Systolic function was normal. The estimated  ejection fraction was in the range of 55% to 65%. Wall  motion was normal; there were no regional wall motion  abnormalities. The study is not technically sufficient to  allow evaluation of LV diastolic function.  - Aortic valve: Trivial regurgitation.  - Mitral valve: Prolapse. Mild regurgitation.  - Atrial septum: No defect or patent foramen ovale was  identified.     Michelle Vazquez [facility] Short Stay Center/Anesthesiology Phone [phone] 09/15/2019 1:32 PM

## 2019-09-15 NOTE — Progress Notes (Signed)
PCP - Dr. Darrin Luis Cardiologist - Dr. Harrell Gave   EKG - 11/12/18 Stress Test -10/16/13  ECHO - 07/17/13 Cardiac Cath - 06/19/07  Sleep Study - denies  Aspirin Instructions: Patient instructed to hold all Aspirin, NSAID's, herbal medications, fish oil and vitamins 7 days prior to surgery.   ERAS Protcol -N/A PRE-SURGERY Ensure or G2- N/A  COVID TEST- 09/15/19@ [facility] at [facility]. Pt instructed to inform security they are having surgery and need to be in the right hand lane. Aware if they arrive at large "white tent", to redirect to the surgical line. Patient educated to quarantine at home following covid test until DOS.    Anesthesia review: cardiac history  Patient denies shortness of breath, fever, cough and chest pain at PAT appointment   All instructions explained to the patient, with a verbal understanding of the material. Patient agrees to go over the instructions while at home for a better understanding. Patient also instructed to self quarantine after being tested for COVID-19. The opportunity to ask questions was provided.

## 2019-09-15 NOTE — Anesthesia Preprocedure Evaluation (Addendum)
Anesthesia Evaluation  Patient identified by MRN, date of birth, ID band Patient awake    Reviewed: Allergy & Precautions, H&P , NPO status , Patient's Chart, lab work & pertinent test results, reviewed documented beta blocker date and time   Airway Mallampati: II  TM Distance: >3 FB Neck ROM: Full    Dental no notable dental hx. (+) Partial Lower, Partial Upper, Dental Advisory Given   Pulmonary neg pulmonary ROS,    Pulmonary exam normal breath sounds clear to auscultation       Cardiovascular Exercise Tolerance: Good negative cardio ROS   Rhythm:Regular Rate:Normal     Neuro/Psych negative neurological ROS  negative psych ROS   GI/Hepatic negative GI ROS, Neg liver ROS,   Endo/Other  negative endocrine ROS  Renal/GU negative Renal ROS  negative genitourinary   Musculoskeletal  (+) Arthritis , Osteoarthritis,    Abdominal   Peds  Hematology negative hematology ROS (+)   Anesthesia Other Findings   Reproductive/Obstetrics negative OB ROS                            Anesthesia Physical Anesthesia Plan  ASA: II  Anesthesia Plan: General   Post-op Pain Management:    Induction: Intravenous  PONV Risk Score and Plan: 3 and Ondansetron, Dexamethasone and Treatment may vary due to age or medical condition  Airway Management Planned: Oral ETT  Additional Equipment:   Intra-op Plan:   Post-operative Plan: Extubation in OR  Informed Consent: I have reviewed the patients History and Physical, chart, labs and discussed the procedure including the risks, benefits and alternatives for the proposed anesthesia with the patient or authorized representative who has indicated his/her understanding and acceptance.     Dental advisory given  Plan Discussed with: CRNA  Anesthesia Plan Comments: (Follows with cardiology for hx of PVCs and HTN. Cardiac clearance per telephone encounter  09/04/19, "Michelle Vazquez was last seen on 11/12/2018 by Dr. Lovena Le.  Since that day, Michelle Vazquez has done well without chest pain or shortness of breath. PVCs are well controlled on bisoprolol and flecainide. Therefore, based on ACC/AHA guidelines, the patient would be at acceptable risk for the planned procedure without further cardiovascular testing."  Significant progressive dysphagia. Barium swallow study on 08/11/2019 showed a significant narrowing in the upper esophagus with dilation of the pharynx.  Preop labs reviewed, WNL.   EKG 11/12/2018: Sinus rhythm with sinus arrhythmia with first-degree AV block.  Rate 84.  Anterolateral infarct, age undetermined.  Holter 10/21/15: 1. NSR 2. Frequent PVC's, mostly monomorphic 3. PAC's with non-sustained atrial tachy 4. No sustained bradycardia or pauses. 5. No sustained atrial or ventricular tachyarrhythmias.  Echo 2014: - Left ventricle: The cavity size was normal. Wall thickness  was normal. Systolic function was normal. The estimated  ejection fraction was in the range of 55% to 65%. Wall  motion was normal; there were no regional wall motion  abnormalities. The study is not technically sufficient to  allow evaluation of LV diastolic function.  - Aortic valve: Trivial regurgitation.  - Mitral valve: Prolapse. Mild regurgitation.  - Atrial septum: No defect or patent foramen ovale was  identified.  )      Anesthesia Quick Evaluation

## 2019-09-16 LAB — SARS CORONAVIRUS 2 (TAT 6-24 HRS): SARS Coronavirus 2: NEGATIVE

## 2019-09-17 ENCOUNTER — Ambulatory Visit (HOSPITAL_COMMUNITY): Payer: Medicare Other | Admitting: Anesthesiology

## 2019-09-17 ENCOUNTER — Ambulatory Visit (HOSPITAL_COMMUNITY): Payer: Medicare Other | Admitting: Physician Assistant

## 2019-09-17 ENCOUNTER — Encounter (HOSPITAL_COMMUNITY)
Admission: RE | Disposition: A | Discharge status: Home / Self Care | Payer: Self-pay | Source: Home / Self Care | Attending: Otolaryngology

## 2019-09-17 ENCOUNTER — Encounter (HOSPITAL_COMMUNITY): Payer: Self-pay | Admitting: Certified Registered Nurse Anesthetist

## 2019-09-17 ENCOUNTER — Other Ambulatory Visit: Payer: Self-pay

## 2019-09-17 ENCOUNTER — Ambulatory Visit (HOSPITAL_COMMUNITY)
Admission: RE | Admit: 2019-09-17 | Discharge: 2019-09-17 | Disposition: A | Discharge status: Home / Self Care | Payer: Medicare Other | Attending: Otolaryngology | Admitting: Otolaryngology

## 2019-09-17 DIAGNOSIS — Z8349 Family history of other endocrine, nutritional and metabolic diseases: Secondary | ICD-10-CM | POA: Insufficient documentation

## 2019-09-17 DIAGNOSIS — E785 Hyperlipidemia, unspecified: Secondary | ICD-10-CM | POA: Insufficient documentation

## 2019-09-17 DIAGNOSIS — I341 Nonrheumatic mitral (valve) prolapse: Secondary | ICD-10-CM | POA: Insufficient documentation

## 2019-09-17 DIAGNOSIS — M810 Age-related osteoporosis without current pathological fracture: Secondary | ICD-10-CM | POA: Diagnosis not present

## 2019-09-17 DIAGNOSIS — R1310 Dysphagia, unspecified: Secondary | ICD-10-CM | POA: Insufficient documentation

## 2019-09-17 DIAGNOSIS — I440 Atrioventricular block, first degree: Secondary | ICD-10-CM | POA: Insufficient documentation

## 2019-09-17 DIAGNOSIS — I340 Nonrheumatic mitral (valve) insufficiency: Secondary | ICD-10-CM | POA: Diagnosis not present

## 2019-09-17 DIAGNOSIS — Z79899 Other long term (current) drug therapy: Secondary | ICD-10-CM | POA: Diagnosis not present

## 2019-09-17 DIAGNOSIS — I10 Essential (primary) hypertension: Secondary | ICD-10-CM | POA: Insufficient documentation

## 2019-09-17 DIAGNOSIS — Z880 Allergy status to penicillin: Secondary | ICD-10-CM | POA: Insufficient documentation

## 2019-09-17 DIAGNOSIS — M1990 Unspecified osteoarthritis, unspecified site: Secondary | ICD-10-CM | POA: Diagnosis not present

## 2019-09-17 DIAGNOSIS — Z8249 Family history of ischemic heart disease and other diseases of the circulatory system: Secondary | ICD-10-CM | POA: Diagnosis not present

## 2019-09-17 DIAGNOSIS — I471 Supraventricular tachycardia: Secondary | ICD-10-CM | POA: Diagnosis not present

## 2019-09-17 DIAGNOSIS — Z85820 Personal history of malignant melanoma of skin: Secondary | ICD-10-CM | POA: Insufficient documentation

## 2019-09-17 DIAGNOSIS — I4891 Unspecified atrial fibrillation: Secondary | ICD-10-CM | POA: Diagnosis not present

## 2019-09-17 DIAGNOSIS — Z888 Allergy status to other drugs, medicaments and biological substances status: Secondary | ICD-10-CM | POA: Diagnosis not present

## 2019-09-17 DIAGNOSIS — I493 Ventricular premature depolarization: Secondary | ICD-10-CM | POA: Diagnosis not present

## 2019-09-17 DIAGNOSIS — K222 Esophageal obstruction: Secondary | ICD-10-CM | POA: Diagnosis not present

## 2019-09-17 HISTORY — PX: ESOPHAGOSCOPY: SHX5534

## 2019-09-17 HISTORY — PX: MICROLARYNGOSCOPY WITH DILATION: SHX5971

## 2019-09-17 SURGERY — MICROLARYNGOSCOPY WITH DILATION
Anesthesia: General | Site: Mouth

## 2019-09-17 MED ORDER — CEFAZOLIN SODIUM-DEXTROSE 2-4 GM/100ML-% IV SOLN
INTRAVENOUS | Status: AC
  Filled 2019-09-17: qty 100

## 2019-09-17 MED ORDER — FENTANYL CITRATE (PF) 100 MCG/2ML IJ SOLN
INTRAMUSCULAR | Status: DC | PRN
  Administered 2019-09-17: 50 ug via INTRAVENOUS

## 2019-09-17 MED ORDER — SUCCINYLCHOLINE CHLORIDE 200 MG/10ML IV SOSY
PREFILLED_SYRINGE | INTRAVENOUS | Status: DC | PRN
  Administered 2019-09-17: 80 mg via INTRAVENOUS

## 2019-09-17 MED ORDER — DEXAMETHASONE SODIUM PHOSPHATE 10 MG/ML IJ SOLN
INTRAMUSCULAR | Status: DC | PRN
  Administered 2019-09-17: 5 mg via INTRAVENOUS

## 2019-09-17 MED ORDER — PHENYLEPHRINE 40 MCG/ML (10ML) SYRINGE FOR IV PUSH (FOR BLOOD PRESSURE SUPPORT)
PREFILLED_SYRINGE | INTRAVENOUS | Status: DC | PRN
  Administered 2019-09-17: 80 ug via INTRAVENOUS

## 2019-09-17 MED ORDER — ACETAMINOPHEN 10 MG/ML IV SOLN
1000.0000 mg | Freq: Once | INTRAVENOUS | Status: AC
  Administered 2019-09-17: 700 mg via INTRAVENOUS
  Filled 2019-09-17: qty 100

## 2019-09-17 MED ORDER — EPINEPHRINE PF 1 MG/ML IJ SOLN
INTRAMUSCULAR | Status: DC | PRN
  Administered 2019-09-17: 30 mg

## 2019-09-17 MED ORDER — LACTATED RINGERS IV SOLN
INTRAVENOUS | Status: DC | PRN
  Administered 2019-09-17: 08:00:00 via INTRAVENOUS

## 2019-09-17 MED ORDER — ACETAMINOPHEN 500 MG PO TABS: 1000.0000 mg | ORAL_TABLET | Freq: Once | ORAL | Status: DC

## 2019-09-17 MED ORDER — ONDANSETRON HCL 4 MG/2ML IJ SOLN
INTRAMUSCULAR | Status: AC
  Filled 2019-09-17: qty 2

## 2019-09-17 MED ORDER — PHENYLEPHRINE 40 MCG/ML (10ML) SYRINGE FOR IV PUSH (FOR BLOOD PRESSURE SUPPORT)
PREFILLED_SYRINGE | INTRAVENOUS | Status: AC
  Filled 2019-09-17: qty 10

## 2019-09-17 MED ORDER — LIDOCAINE 2% (20 MG/ML) 5 ML SYRINGE
INTRAMUSCULAR | Status: AC
  Filled 2019-09-17: qty 5

## 2019-09-17 MED ORDER — PROPOFOL 10 MG/ML IV BOLUS
INTRAVENOUS | Status: DC | PRN
  Administered 2019-09-17: 60 mg via INTRAVENOUS

## 2019-09-17 MED ORDER — 0.9 % SODIUM CHLORIDE (POUR BTL) OPTIME
TOPICAL | Status: DC | PRN
  Administered 2019-09-17: 1000 mL

## 2019-09-17 MED ORDER — LIDOCAINE 2% (20 MG/ML) 5 ML SYRINGE
INTRAMUSCULAR | Status: DC | PRN
  Administered 2019-09-17: 60 mg via INTRAVENOUS

## 2019-09-17 MED ORDER — ACETAMINOPHEN 500 MG PO TABS
ORAL_TABLET | ORAL | Status: AC
  Filled 2019-09-17: qty 2

## 2019-09-17 MED ORDER — SUCCINYLCHOLINE CHLORIDE 200 MG/10ML IV SOSY
PREFILLED_SYRINGE | INTRAVENOUS | Status: AC
  Filled 2019-09-17: qty 10

## 2019-09-17 MED ORDER — FENTANYL CITRATE (PF) 100 MCG/2ML IJ SOLN: 25.0000 ug | INTRAMUSCULAR | Status: DC | PRN

## 2019-09-17 MED ORDER — DEXAMETHASONE SODIUM PHOSPHATE 10 MG/ML IJ SOLN
INTRAMUSCULAR | Status: AC
  Filled 2019-09-17: qty 1

## 2019-09-17 MED ORDER — EPINEPHRINE HCL (NASAL) 0.1 % NA SOLN
NASAL | Status: AC
  Filled 2019-09-17: qty 30

## 2019-09-17 MED ORDER — FENTANYL CITRATE (PF) 250 MCG/5ML IJ SOLN
INTRAMUSCULAR | Status: AC
  Filled 2019-09-17: qty 5

## 2019-09-17 MED ORDER — CEFAZOLIN SODIUM-DEXTROSE 2-3 GM-%(50ML) IV SOLR
INTRAVENOUS | Status: DC | PRN
  Administered 2019-09-17: 2 g via INTRAVENOUS

## 2019-09-17 MED ORDER — ONDANSETRON HCL 4 MG/2ML IJ SOLN
INTRAMUSCULAR | Status: DC | PRN
  Administered 2019-09-17: 4 mg via INTRAVENOUS

## 2019-09-17 SURGICAL SUPPLY — 34 items
BALLN PULM 12 13.5 15X75 (BALLOONS)
BALLN PULM 15 16.5 18X75 (BALLOONS)
BALLN PULMONARY 10-12 (MISCELLANEOUS) IMPLANT
BALLN PULMONARY 8-10 OD75 (MISCELLANEOUS) IMPLANT
BALLOON PULM 12 13.5 15X75 (BALLOONS) IMPLANT
BALLOON PULM 15 16.5 18X75 (BALLOONS) IMPLANT
BNDG EYE OVAL (GAUZE/BANDAGES/DRESSINGS) IMPLANT
CANISTER SUCT 3000ML PPV (MISCELLANEOUS) ×3 IMPLANT
CONT SPEC 4OZ CLIKSEAL STRL BL (MISCELLANEOUS) IMPLANT
COVER BACK TABLE 60X90IN (DRAPES) ×3 IMPLANT
COVER WAND RF STERILE (DRAPES) IMPLANT
DRAPE HALF SHEET 40X57 (DRAPES) ×3 IMPLANT
GAUZE SPONGE 4X4 12PLY STRL (GAUZE/BANDAGES/DRESSINGS) ×3 IMPLANT
GLOVE BIO SURGEON STRL SZ7.5 (GLOVE) ×3 IMPLANT
GOWN STRL REUS W/ TWL LRG LVL3 (GOWN DISPOSABLE) IMPLANT
GOWN STRL REUS W/TWL LRG LVL3 (GOWN DISPOSABLE)
KIT BASIN OR (CUSTOM PROCEDURE TRAY) ×3 IMPLANT
KIT TURNOVER KIT B (KITS) ×3 IMPLANT
NEEDLE HYPO 25GX1X1/2 BEV (NEEDLE) IMPLANT
NS IRRIG 1000ML POUR BTL (IV SOLUTION) ×3 IMPLANT
PAD ARMBOARD 7.5X6 YLW CONV (MISCELLANEOUS) ×3 IMPLANT
PATTIES SURGICAL .5 X1 (DISPOSABLE) IMPLANT
PATTIES SURGICAL .5 X3 (DISPOSABLE) ×3 IMPLANT
POSITIONER HEAD DONUT 9IN (MISCELLANEOUS) ×3 IMPLANT
SET COLLECT BLD 25X3/4 12 (NEEDLE) IMPLANT
SOL ANTI FOG 6CC (MISCELLANEOUS) IMPLANT
SOLUTION ANTI FOG 6CC (MISCELLANEOUS)
SURGILUBE 2OZ TUBE FLIPTOP (MISCELLANEOUS) ×3 IMPLANT
SUT SILK 2 0 SH (SUTURE) IMPLANT
SYR CONTROL 10ML LL (SYRINGE) IMPLANT
SYR GAUGE ALLIANCE SINGLE USE (MISCELLANEOUS) IMPLANT
TOWEL GREEN STERILE FF (TOWEL DISPOSABLE) ×3 IMPLANT
TUBE CONNECTING 12X1/4 (SUCTIONS) ×3 IMPLANT
WATER STERILE IRR 1000ML POUR (IV SOLUTION) ×3 IMPLANT

## 2019-09-17 NOTE — Op Note (Signed)
Operative Note: DIRECT LARYNGOSCOPY/ESOPHAGOSCOPY/DILATION  Patient: Michelle Vazquez  Medical record number: [id number]  Date:09/17/2019  Pre-operative Indications: Progressive dysphagia  Postoperative Indications: Same  Surgical Procedure: Direct Laryngoscopy and Esophagoscopy with Esophageal Dilation  Anesthesia: GET  Surgeon: Delsa Bern, M.D.  Assist: None  Complications: None  EBL: None   Brief History: The patient is a 85 y.o. female with a history of chronic progressive dysphagia.  The patient has a longstanding history over many decades of difficulty swallowing.  She is been seen and treated through gastroenterology with esophagoscopy and esophageal dilation.  She was recently seen by Dr. Oletta Lamas with increasing complaints of difficulty swallowing and cough.  Barium swallow study showed significant narrowing of the proximal esophagus with dilation above.  No muscle band or pouch consistent with Zenker's diverticulum.  Dr. Oletta Lamas felt the patient was not a candidate for flexible esophagoscopy with dilation and referred her to our office for evaluation.  Given the patient's history and findings I recommended direct laryngoscopy and cervical esophagoscopy with esophageal dilation under general anesthesia, risks and benefits were discussed in detail with the patient and her family. They understand and agree with our plan for surgery which is scheduled at [facility] on an elective basis.  Surgical Procedure: The patient is brought to the operating room on 09/17/2019 and placed in supine position on the operating table. General endotracheal anesthesia was established without difficulty. When the patient was adequately anesthetized, surgical timeout was performed and correct identification of the patient and the surgical procedure. The patient was positioned and prepped and draped in sterile fashion.  A laryngoscope was used to examine the patient's oral cavity, oropharynx and  larynx.  Findings include normal base of tongue, supraglottis and larynx, normal vocal cords.  No biopsy was taken.  There was no significant bleeding and the patient's airway was stable.  The patient's hypopharynx was then examined using the laryngoscope, cervical esophagoscope and eventually Weerda scope.  The patient was found to have a dilated proximal esophagus without evidence of mucosal mass or lesion.  It was difficult to identify the natural lumen of the esophagus but with microscopic exam and gentle dilation the natural limb was identified.  Dilation was then undertaken using Pilling esophageal dilators from 10-42.  These were passed through the area of narrowing without difficulty, there is no bleeding or evidence of a mucosal tear.  The cervical esophagoscope was then gently passed through the area of stricture without difficulty, this passed to 30 cm and there is no evidence of additional esophageal abnormality, mass or lesion.  An orogastric tube was passed and stomach contents were aspirated. Patient was awakened from anesthetic and transferred from the operating room to the recovery room in stable condition. There were no complications and blood loss was minimal.   Delsa Bern, M.D. [facility] ENT 09/17/2019

## 2019-09-17 NOTE — H&P (Signed)
Michelle Vazquez is an 85 y.o. female.   Chief Complaint: Dysphagia HPI: Hx of prog dysphagia and esophageal stricture  Past Medical History:  Diagnosis Date  . Arthritis   . Atrial fibrillation ([place])   . Bowel obstruction ([place]) 2000  . Cataract    bilateral removed  . Complication of anesthesia    hard to wake after hysterectomy  . HLD (hyperlipidemia)   . Hypercontractile esophagus   . Mitral valve prolapse   . Osteoporosis    declines rx and declines DEXA f/u  . Parathyroid adenoma    post parathyroid surgery in October of 2008  . Paroxysmal supraventricular tachycardia ([place])   . Premature ventricular contractions (PVCs) (VPCs)   . Schatzki's ring 08/2013   Dr. Hilarie Fredrickson, s/p dilation 08/2013, ?benefit  . Squamous cell carcinoma in situ of skin    face  . Urinary incontinence     Past Surgical History:  Procedure Laterality Date  . ABDOMINAL HYSTERECTOMY  1989  . BREAST BIOPSY  1979   left  . CATARACT EXTRACTION     bilateral  . ESOPHAGEAL MANOMETRY N/A 11/17/2015   Procedure: ESOPHAGEAL MANOMETRY (EM);  Surgeon: Jerene Bears, MD;  Location: [facility];  Service: Gastroenterology;  Laterality: N/A;  . EYE SURGERY    . LAPAROSCOPIC SMALL BOWEL RESECTION  2000  . parathyroid surgery  2008?   left  . SHOULDER SURGERY  02/1999   right  . SKIN LESION EXCISION  08/1999   left side of face    Family History  Problem Relation Age of Onset  . Arthritis Father   . Heart disease Father   . Hypertension Father   . Stroke Father   . Heart attack Father   . Arthritis Mother   . Stroke Mother   . Stroke Brother   . Diabetes Brother   . Hypertension Brother   . Colon cancer Sister        in her 56's  . Hyperlipidemia Sister   . Hypertension Sister   . Hypertension Sister   . Diabetes Sister   . Esophageal cancer Neg Hx   . Rectal cancer Neg Hx   . Stomach cancer Neg Hx    Social History:  reports that she has never smoked. She has never used smokeless tobacco. She  reports that she does not drink alcohol or use drugs.  Allergies:  Allergies  Allergen Reactions  . Diltiazem Hcl Other (See Comments)    Rapid heartbeat and elevated blood pressure  . Inderal [Propranolol Hcl]     Pt does not remember reaction  . Lopressor [Metoprolol Tartrate]     Per the pt' "I felt like I was going to pass out"  . Penicillins Diarrhea    Medications Prior to Admission  Medication Sig Dispense Refill  . bisoprolol (ZEBETA) 5 MG tablet TAKE 1/2 (ONE-HALF) TABLET BY MOUTH ONCE DAILY. (Patient taking differently: Take 2.5 mg by mouth daily. ) 45 tablet 3  . calcium gluconate 500 MG tablet Take 500 mg by mouth daily.      . flecainide (TAMBOCOR) 50 MG tablet Take 1 tablet (50 mg total) by mouth 2 (two) times daily. 180 tablet 3  . Multiple Vitamin (MULTIVITAMIN WITH MINERALS) TABS tablet Take 1 tablet by mouth daily.      Results for orders placed or performed during the hospital encounter of 09/15/19 (from the past 48 hour(s))  SARS CORONAVIRUS 2 (TAT 6-24 HRS) Nasopharyngeal Nasopharyngeal Swab  Status: None   Collection Time: 09/15/19 12:48 PM   Specimen: Nasopharyngeal Swab  Result Value Ref Range   SARS Coronavirus 2 NEGATIVE NEGATIVE    Comment: (NOTE) SARS-CoV-2 target nucleic acids are NOT DETECTED. The SARS-CoV-2 RNA is generally detectable in upper and lower respiratory specimens during the acute phase of infection. Negative results do not preclude SARS-CoV-2 infection, do not rule out co-infections with other pathogens, and should not be used as the sole basis for treatment or other patient management decisions. Negative results must be combined with clinical observations, patient history, and epidemiological information. The expected result is Negative. Fact Sheet for Patients: SugarRoll.be Fact Sheet for Healthcare Providers: https://www.woods-mathews.com/ This test is not yet approved or cleared by the  [place] FDA and  has been authorized for detection and/or diagnosis of SARS-CoV-2 by FDA under an Emergency Use Authorization (EUA). This EUA will remain  in effect (meaning this test can be used) for the duration of the COVID-19 declaration under Section 56 4(b)(1) of the Act, 21 U.S.C. section 360bbb-3(b)(1), unless the authorization is terminated or revoked sooner. Performed at [facility], [place] [address] [place], [place] [address]    No results found.  Review of Systems  Constitutional: Negative.   HENT: Negative.   Respiratory: Negative.   Cardiovascular: Negative.     Blood pressure (!) 145/80, pulse 75, temperature 97.8 F (36.6 C), temperature source Oral, resp. rate 18, height 5\' 1"  (1.549 m), weight 48.1 kg, SpO2 100 %. Physical Exam  Constitutional: She appears well-developed and well-nourished.  HENT:  Mouth/Throat: Oropharynx is clear and moist.  Neck: Normal range of motion. Neck supple.  Cardiovascular: Normal rate.  Respiratory: Effort normal.     Assessment/Plan Adm for DLE and esophageal dilation  Jerrell Belfast, MD 09/17/2019, 8:32 AM

## 2019-09-17 NOTE — Anesthesia Postprocedure Evaluation (Signed)
Anesthesia Post Note  Patient: Michelle Vazquez  Procedure(s) Performed: MICROLARYNGOSCOPY WITH DILATION (N/A Mouth) Cervical Esophagoscopy (Mouth)     Patient location during evaluation: PACU Anesthesia Type: General Level of consciousness: awake and alert Pain management: pain level controlled Vital Signs Assessment: post-procedure vital signs reviewed and stable Respiratory status: spontaneous breathing, nonlabored ventilation and respiratory function stable Cardiovascular status: blood pressure returned to baseline and stable Postop Assessment: no apparent nausea or vomiting Anesthetic complications: no    Last Vitals:  Vitals:   09/17/19 0955 09/17/19 1010  BP: (!) 150/80 (!) 162/75  Pulse: 66 63  Resp: 16 12  Temp:  36.7 C  SpO2: 96% 99%    Last Pain:  Vitals:   09/17/19 1010  TempSrc:   PainSc: 0-No pain                 Asberry Lascola,W. EDMOND

## 2019-09-17 NOTE — Anesthesia Procedure Notes (Signed)
Procedure Name: Intubation Date/Time: 09/17/2019 8:46 AM Performed by: Genelle Bal, CRNA Pre-anesthesia Checklist: Patient identified, Emergency Drugs available, Suction available and Patient being monitored Patient Re-evaluated:Patient Re-evaluated prior to induction Oxygen Delivery Method: Circle system utilized Preoxygenation: Pre-oxygenation with 100% oxygen Induction Type: IV induction Ventilation: Mask ventilation without difficulty Laryngoscope Size: Miller and 2 Grade View: Grade I Tube type: Oral Tube size: 7.0 mm Number of attempts: 1 Airway Equipment and Method: Stylet and Oral airway Placement Confirmation: ETT inserted through vocal cords under direct vision,  positive ETCO2 and breath sounds checked- equal and bilateral Secured at: 20 cm Tube secured with: Tape Dental Injury: Teeth and Oropharynx as per pre-operative assessment

## 2019-09-17 NOTE — Transfer of Care (Signed)
Immediate Anesthesia Transfer of Care Note  Patient: Michelle Vazquez  Procedure(s) Performed: MICROLARYNGOSCOPY WITH DILATION (N/A Mouth) Cervical Esophagoscopy (Mouth)  Patient Location: PACU  Anesthesia Type:General  Level of Consciousness: drowsy and patient cooperative  Airway & Oxygen Therapy: Patient Spontanous Breathing and Patient connected to face mask oxygen  Post-op Assessment: Report given to RN and Post -op Vital signs reviewed and stable  Post vital signs: Reviewed and stable  Last Vitals:  Vitals Value Taken Time  BP 167/79 09/17/19 0937  Temp    Pulse 62 09/17/19 0938  Resp 10 09/17/19 0938  SpO2 100 % 09/17/19 0938  Vitals shown include unvalidated device data.  Last Pain:  Vitals:   09/17/19 0717  TempSrc:   PainSc: 0-No pain         Complications: No apparent anesthesia complications

## 2019-09-18 ENCOUNTER — Encounter (HOSPITAL_COMMUNITY): Payer: Self-pay | Admitting: Otolaryngology

## 2019-11-17 ENCOUNTER — Telehealth: Payer: Self-pay

## 2019-11-17 NOTE — Telephone Encounter (Signed)
Copied from [place] [id number]. Topic: General - Other >> Nov 17, 2019  4:40 PM Rainey Pines A wrote: Patient would like Dr. Quay Burow to advise her on if she should get the covid vaccine . Patient schedule appt to get vaccine on 11/27/2019.

## 2019-11-17 NOTE — Telephone Encounter (Signed)
Yes she should get it  

## 2019-11-18 NOTE — Telephone Encounter (Signed)
Patient is aware 

## 2019-11-21 ENCOUNTER — Other Ambulatory Visit: Payer: Self-pay | Admitting: Internal Medicine

## 2019-11-21 DIAGNOSIS — I493 Ventricular premature depolarization: Secondary | ICD-10-CM

## 2019-11-25 DIAGNOSIS — H35372 Puckering of macula, left eye: Secondary | ICD-10-CM | POA: Diagnosis not present

## 2019-11-25 DIAGNOSIS — H18513 Endothelial corneal dystrophy, bilateral: Secondary | ICD-10-CM | POA: Diagnosis not present

## 2019-11-25 DIAGNOSIS — H353131 Nonexudative age-related macular degeneration, bilateral, early dry stage: Secondary | ICD-10-CM | POA: Diagnosis not present

## 2019-12-05 ENCOUNTER — Other Ambulatory Visit: Payer: Self-pay | Admitting: Internal Medicine

## 2019-12-19 ENCOUNTER — Ambulatory Visit: Payer: Medicare Other | Admitting: Internal Medicine

## 2020-01-08 ENCOUNTER — Other Ambulatory Visit: Payer: Self-pay | Admitting: Internal Medicine

## 2020-01-31 DIAGNOSIS — M25561 Pain in right knee: Secondary | ICD-10-CM | POA: Diagnosis not present

## 2020-01-31 DIAGNOSIS — W19XXXA Unspecified fall, initial encounter: Secondary | ICD-10-CM | POA: Diagnosis not present

## 2020-02-06 ENCOUNTER — Encounter: Payer: Self-pay | Admitting: Internal Medicine

## 2020-02-06 ENCOUNTER — Other Ambulatory Visit: Payer: Self-pay

## 2020-02-06 ENCOUNTER — Ambulatory Visit (INDEPENDENT_AMBULATORY_CARE_PROVIDER_SITE_OTHER): Payer: Medicare Other | Admitting: Internal Medicine

## 2020-02-06 VITALS — BP 110/72 | HR 84 | Ht 61.5 in | Wt 110.6 lb

## 2020-02-06 DIAGNOSIS — I493 Ventricular premature depolarization: Secondary | ICD-10-CM

## 2020-02-06 DIAGNOSIS — I10 Essential (primary) hypertension: Secondary | ICD-10-CM | POA: Diagnosis not present

## 2020-02-06 NOTE — Patient Instructions (Signed)

## 2020-02-06 NOTE — Progress Notes (Signed)
HPI Michelle Vazquez returns today for followup of her PVC's and HTN. She is a pleasant 86 yo woman with a h/o palpitations who has been placed on flecainide. Her PVC's have resolved. She has c/o difficulty swallowing due to [place] esophagus. Her esophagus has been dilated and her swallowing improved a little. She still has trouble with protein. Allergies  Allergen Reactions  . Diltiazem Hcl Other (See Comments)    Rapid heartbeat and elevated blood pressure  . Inderal [Propranolol Hcl]     Pt does not remember reaction  . Lopressor [Metoprolol Tartrate]     Per the pt' "I felt like I was going to pass out"  . Penicillins Diarrhea     Current Outpatient Medications  Medication Sig Dispense Refill  . bisoprolol (ZEBETA) 5 MG tablet Take 1/2 (one-half) tablet by mouth once daily 45 tablet 0  . calcium gluconate 500 MG tablet Take 500 mg by mouth daily.      . flecainide (TAMBOCOR) 50 MG tablet Take 1 tablet by mouth twice daily 60 tablet 0  . Multiple Vitamin (MULTIVITAMIN WITH MINERALS) TABS tablet Take 1 tablet by mouth daily.     No current facility-administered medications for this visit.     Past Medical History:  Diagnosis Date  . Arthritis   . Atrial fibrillation ([place])   . Bowel obstruction ([place]) 2000  . Cataract    bilateral removed  . Complication of anesthesia    hard to wake after hysterectomy  . HLD (hyperlipidemia)   . Hypercontractile esophagus   . Mitral valve prolapse   . Osteoporosis    declines rx and declines DEXA f/u  . Parathyroid adenoma    post parathyroid surgery in October of 2008  . Paroxysmal supraventricular tachycardia ([place])   . Premature ventricular contractions (PVCs) (VPCs)   . Schatzki's ring 08/2013   Dr. Hilarie Fredrickson, s/p dilation 08/2013, ?benefit  . Squamous cell carcinoma in situ of skin    face  . Urinary incontinence     ROS:   All systems reviewed and negative except as noted in the HPI.   Past Surgical History:    Procedure Laterality Date  . ABDOMINAL HYSTERECTOMY  1989  . BREAST BIOPSY  1979   left  . CATARACT EXTRACTION     bilateral  . ESOPHAGEAL MANOMETRY N/A 11/17/2015   Procedure: ESOPHAGEAL MANOMETRY (EM);  Surgeon: Jerene Bears, MD;  [facility];  Service: Gastroenterology;  Laterality: N/A;  . ESOPHAGOSCOPY  09/17/2019   Procedure: Cervical Esophagoscopy;  Surgeon: Jerrell Belfast, MD;  Location: [facility];  Service: ENT;;  . EYE SURGERY    . LAPAROSCOPIC SMALL BOWEL RESECTION  2000  . MICROLARYNGOSCOPY WITH DILATION N/A 09/17/2019   Procedure: MICROLARYNGOSCOPY WITH DILATION;  Surgeon: Jerrell Belfast, MD;  Location: [facility];  Service: ENT;  Laterality: N/A;  . parathyroid surgery  2008?   left  . SHOULDER SURGERY  02/1999   right  . SKIN LESION EXCISION  08/1999   left side of face     Family History  Problem Relation Age of Onset  . Arthritis Father   . Heart disease Father   . Hypertension Father   . Stroke Father   . Heart attack Father   . Arthritis Mother   . Stroke Mother   . Stroke Brother   . Diabetes Brother   . Hypertension Brother   . Colon cancer Sister        in her  27's  . Hyperlipidemia Sister   . Hypertension Sister   . Hypertension Sister   . Diabetes Sister   . Esophageal cancer Neg Hx   . Rectal cancer Neg Hx   . Stomach cancer Neg Hx      Social History   Socioeconomic History  . Marital status: Married    Spouse name: Not on file  . Number of children: 4  . Years of education: 12+  . Highest education level: Not on file  Occupational History  . Occupation: Retired    Comment: Research officer, political party   Tobacco Use  . Smoking status: Never Smoker  . Smokeless tobacco: Never Used  Substance and Sexual Activity  . Alcohol use: No  . Drug use: No  . Sexual activity: Not Currently  Other Topics Concern  . Not on file  Social History Narrative   Regular exercise-sometimes   Caffeine Use-no   married x 2, caregiver of 43yo husband  (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) - retired Adult nurse   Social Determinants of Radio broadcast assistant Strain:   . Difficulty of Paying Living Expenses:   Food Insecurity:   . Worried About Charity fundraiser in the Last Year:   . Arboriculturist in the Last Year:   Transportation Needs:   . Film/video editor (Medical):   Marland Kitchen Lack of Transportation (Non-Medical):   Physical Activity:   . Days of Exercise per Week:   . Minutes of Exercise per Session:   Stress:   . Feeling of Stress :   Social Connections:   . Frequency of Communication with Friends and Family:   . Frequency of Social Gatherings with Friends and Family:   . Attends Religious Services:   . Active Member of Clubs or Organizations:   . Attends Archivist Meetings:   Marland Kitchen Marital Status:   Intimate Partner Violence:   . Fear of Current or Ex-Partner:   . Emotionally Abused:   Marland Kitchen Physically Abused:   . Sexually Abused:      BP 110/72   Pulse 84   Ht 5' 1.5" (1.562 m)   Wt 110 lb 9.6 oz (50.2 kg)   SpO2 90%   BMI 20.56 kg/m   Physical Exam:  Well appearing NAD HEENT: Unremarkable Neck:  No JVD, no thyromegally Lymphatics:  No adenopathy Back:  No CVA tenderness Lungs:  Clear with no wheezes HEART:  Regular rate rhythm, no murmurs, no rubs, no clicks Abd:  soft, positive bowel sounds, no organomegally, no rebound, no guarding Ext:  2 plus pulses, no edema, no cyanosis, no clubbing Skin:  No rashes no nodules Neuro:  CN II through XII intact, motor grossly intact  EKG - nsr with first degree AV block and PVC's  Assess/Plan: 1. PVC's - she is still asymptomatic. We will continue her flecainide. 2. HTN - her bp is well controlled. She will continue her current meds.  3. Dysphagia - her weight is actually up 5 lbs since her last visit. I encouraged her to be sure to get enough protein.  Mikle Bosworth.D.

## 2020-02-08 ENCOUNTER — Other Ambulatory Visit: Payer: Self-pay | Admitting: Internal Medicine

## 2020-02-18 ENCOUNTER — Other Ambulatory Visit: Payer: Self-pay | Admitting: Internal Medicine

## 2020-02-18 DIAGNOSIS — I493 Ventricular premature depolarization: Secondary | ICD-10-CM

## 2020-05-25 DIAGNOSIS — H35372 Puckering of macula, left eye: Secondary | ICD-10-CM | POA: Diagnosis not present

## 2020-05-25 DIAGNOSIS — H353131 Nonexudative age-related macular degeneration, bilateral, early dry stage: Secondary | ICD-10-CM | POA: Diagnosis not present

## 2020-05-25 DIAGNOSIS — H18513 Endothelial corneal dystrophy, bilateral: Secondary | ICD-10-CM | POA: Diagnosis not present

## 2020-06-01 DIAGNOSIS — H353132 Nonexudative age-related macular degeneration, bilateral, intermediate dry stage: Secondary | ICD-10-CM | POA: Diagnosis not present

## 2020-06-01 DIAGNOSIS — D3131 Benign neoplasm of right choroid: Secondary | ICD-10-CM | POA: Diagnosis not present

## 2020-06-01 DIAGNOSIS — H35372 Puckering of macula, left eye: Secondary | ICD-10-CM | POA: Diagnosis not present

## 2020-06-07 DIAGNOSIS — H18513 Endothelial corneal dystrophy, bilateral: Secondary | ICD-10-CM | POA: Diagnosis not present

## 2020-06-18 DIAGNOSIS — L578 Other skin changes due to chronic exposure to nonionizing radiation: Secondary | ICD-10-CM | POA: Diagnosis not present

## 2020-06-18 DIAGNOSIS — D2239 Melanocytic nevi of other parts of face: Secondary | ICD-10-CM | POA: Diagnosis not present

## 2020-06-18 DIAGNOSIS — L853 Xerosis cutis: Secondary | ICD-10-CM | POA: Diagnosis not present

## 2020-06-18 DIAGNOSIS — L820 Inflamed seborrheic keratosis: Secondary | ICD-10-CM | POA: Diagnosis not present

## 2020-06-18 DIAGNOSIS — D692 Other nonthrombocytopenic purpura: Secondary | ICD-10-CM | POA: Diagnosis not present

## 2020-06-18 DIAGNOSIS — L821 Other seborrheic keratosis: Secondary | ICD-10-CM | POA: Diagnosis not present

## 2020-06-18 DIAGNOSIS — Z85828 Personal history of other malignant neoplasm of skin: Secondary | ICD-10-CM | POA: Diagnosis not present

## 2020-06-18 DIAGNOSIS — L814 Other melanin hyperpigmentation: Secondary | ICD-10-CM | POA: Diagnosis not present

## 2020-08-25 DIAGNOSIS — Z23 Encounter for immunization: Secondary | ICD-10-CM | POA: Diagnosis not present

## 2020-08-27 ENCOUNTER — Ambulatory Visit: Payer: Self-pay

## 2020-08-27 NOTE — Telephone Encounter (Signed)
should wait a few days or sooner if she develops symptoms - it is too early to get it now

## 2020-08-27 NOTE — Telephone Encounter (Signed)
Patient has been exposed to covid and wants to know if she should test for covid although she does not have symptoms at this time. Please Arnoldo Hooker

## 2020-08-27 NOTE — Telephone Encounter (Signed)
Pt has been informed and expressed understanding.  

## 2020-09-07 DIAGNOSIS — N183 Chronic kidney disease, stage 3 unspecified: Secondary | ICD-10-CM | POA: Insufficient documentation

## 2020-09-07 DIAGNOSIS — N1831 Chronic kidney disease, stage 3a: Secondary | ICD-10-CM | POA: Insufficient documentation

## 2020-09-07 NOTE — Progress Notes (Signed)
Subjective:    Patient ID: Michelle Vazquez, female    DOB: [birth year], 86 y.o.   MRN: [id number]  HPI The patient is here for follow up of their chronic medical problems, including htn,   She had her throat stretched since she was here.  She is starting to feel she will need to have it stretched soon.  She has difficulty getting liquids down - food is a little easier.  She coughs when eating.   She does all her housework and yard work.  Her neighbor mows her yard. She does an occasion exercise video.    Medications and allergies reviewed with patient and updated if appropriate.  Patient Active Problem List   Diagnosis Date Noted  . CKD (chronic kidney disease) stage 3, GFR 30-59 ml/min (HCC) 09/07/2020  . Hypertension 09/07/2019  . Cough 09/04/2017  . Family history of diabetes mellitus (DM) 08/28/2016  . Osteoporosis   . Dysphagia 08/28/2013  . Arthritis   . HLD (hyperlipidemia)   . 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 Mitral valve prolapse 06/21/2011  . Parathyroid adenoma 06/21/2011    Current Outpatient Medications on File Prior to Visit  Medication Sig Dispense Refill  . bisoprolol (ZEBETA) 5 MG tablet Take 1/2 (one-half) tablet by mouth once daily 45 tablet 3  . calcium gluconate 500 MG tablet Take 500 mg by mouth daily.      . flecainide (TAMBOCOR) 50 MG tablet Take 1 tablet by mouth twice daily 60 tablet 6  . Multiple Vitamin (MULTIVITAMIN WITH MINERALS) TABS tablet Take 1 tablet by mouth daily.    . Multiple Vitamins-Minerals (VISION-VITE PRESERVE PO) Take 2 tablets by mouth 2 (two) times daily.     No current facility-administered medications on file prior to visit.    Past Medical History:  Diagnosis Date  . Arthritis   . Atrial fibrillation ([place])   . Bowel obstruction ([place]) 2000  . Cataract    bilateral removed  . Complication of anesthesia    hard to wake after hysterectomy  . HLD (hyperlipidemia)   . Hypercontractile esophagus   . Mitral valve prolapse   .  Osteoporosis    declines rx and declines DEXA f/u  . Parathyroid adenoma    post parathyroid surgery in October of 2008  . Paroxysmal supraventricular tachycardia ([place])   . Premature ventricular contractions (PVCs) (VPCs)   . Schatzki's ring 08/2013   Dr. Hilarie Fredrickson, s/p dilation 08/2013, ?benefit  . Squamous cell carcinoma in situ of skin    face  . Urinary incontinence     Past Surgical History:  Procedure Laterality Date  . ABDOMINAL HYSTERECTOMY  1989  . BREAST BIOPSY  1979   left  . CATARACT EXTRACTION     bilateral  . ESOPHAGEAL MANOMETRY N/A 11/17/2015   Procedure: ESOPHAGEAL MANOMETRY (EM);  Surgeon: Jerene Bears, MD;  Location: [facility];  Service: Gastroenterology;  Laterality: N/A;  . ESOPHAGOSCOPY  09/17/2019   Procedure: Cervical Esophagoscopy;  Surgeon: Jerrell Belfast, MD;  Location: [facility];  Service: ENT;;  . EYE SURGERY    . LAPAROSCOPIC SMALL BOWEL RESECTION  2000  . MICROLARYNGOSCOPY WITH DILATION N/A 09/17/2019   Procedure: MICROLARYNGOSCOPY WITH DILATION;  Surgeon: Jerrell Belfast, MD;  Location: [place];  Service: ENT;  Laterality: N/A;  . parathyroid surgery  2008?   left  . SHOULDER SURGERY  02/1999   right  . SKIN LESION EXCISION  08/1999   left side of face    Social  History   Socioeconomic History  . Marital status: Married    Spouse name: Not on file  . Number of children: 4  . Years of education: 12+  . Highest education level: Not on file  Occupational History  . Occupation: Retired    Comment: Research officer, political party   Tobacco Use  . Smoking status: Never Smoker  . Smokeless tobacco: Never Used  Vaping Use  . Vaping Use: Never used  Substance and Sexual Activity  . Alcohol use: No  . Drug use: No  . Sexual activity: Not Currently  Other Topics Concern  . 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 Difficulty of Paying Living Expenses: Not on file  Food Insecurity:   . Worried About Charity fundraiser in the Last Year: Not on file  . Ran Out of Food in the Last Year: Not on file  Transportation Needs:   . Lack of Transportation (Medical): Not on file  . Lack of Transportation (Non-Medical): Not on file  Physical Activity:   . Days of Exercise per Week: Not on file  . Minutes of Exercise per Session: Not on file  Stress:   . Feeling of Stress : Not on file  Social Connections:   . Frequency of Communication with Friends and Family: Not on file  . Frequency of Social Gatherings with Friends and Family: Not on file  . Attends Religious Services: Not on file  . Active Member of Clubs or Organizations: Not on file  . Attends Archivist Meetings: Not on file  . Marital Status: Not on file    Family History  Problem Relation Age of Onset  . Arthritis Father   . Heart disease Father   . Hypertension Father   . Stroke Father   . Heart attack Father   . Arthritis Mother   . Stroke Mother   . Stroke Brother   . Diabetes Brother   . Hypertension Brother   . Colon cancer Sister        in her 35's  . Hyperlipidemia Sister   . Hypertension Sister   . Hypertension Sister   . Diabetes Sister   . Esophageal cancer Neg Hx   . Rectal cancer Neg Hx   . Stomach cancer Neg Hx     Review of Systems  Constitutional: Negative for chills and fever.  HENT: Positive for trouble swallowing.   Respiratory: Positive for cough (with eating and when she lays down) and choking. Negative for shortness of breath and wheezing.   Cardiovascular: Negative for chest pain, palpitations and leg swelling.  Gastrointestinal: Negative for abdominal pain, constipation, diarrhea and nausea.       No gerd  Neurological: Negative for light-headedness and headaches.  Psychiatric/Behavioral: Positive for sleep disturbance (nocturia and  cough). Negative for dysphoric mood. The patient is not nervous/anxious.        Objective:   Vitals:   09/08/20 0815  BP: 116/64  Pulse: 87  Temp: 97.9 F (36.6 C)  SpO2: 96%   BP Readings from Last 3 Encounters:  09/08/20 116/64  02/06/20 110/72  09/17/19 (!) 162/75   Wt Readings from Last 3 Encounters:  09/08/20 104 lb (47.2 kg)  02/06/20 110 lb 9.6 oz (50.2 kg)  09/17/19 106  lb (48.1 kg)   Body mass index is 19.33 kg/m.   Physical Exam    Constitutional: Appears well-developed and well-nourished. No distress.  HENT:  Head: Normocephalic and atraumatic.  Neck: Neck supple. No tracheal deviation present. No thyromegaly present.  No cervical lymphadenopathy Cardiovascular: Normal rate, regular rhythm and normal heart sounds.   No murmur heard. No carotid bruit .  No edema Pulmonary/Chest: Effort normal and breath sounds normal. No respiratory distress. No has no wheezes. No rales.  Abdomen: Soft, NT, ND Skin: Skin is warm and dry. Not diaphoretic.  Psychiatric: Normal mood and affect. Behavior is normal.      Assessment & Plan:    See Problem List for Assessment and Plan of chronic medical problems.    This visit occurred during the SARS-CoV-2 public health emergency.  Safety protocols were in place, including screening questions prior to the visit, additional usage of staff PPE, and extensive cleaning of exam room while observing appropriate contact time as indicated for disinfecting solutions.

## 2020-09-07 NOTE — Patient Instructions (Addendum)
°  Blood work was ordered.    Flu immunization administered today.    Medications reviewed and updated.  Changes include :   none     Please followup in 1 year

## 2020-09-08 ENCOUNTER — Encounter: Payer: Self-pay | Admitting: Internal Medicine

## 2020-09-08 ENCOUNTER — Other Ambulatory Visit: Payer: Self-pay

## 2020-09-08 ENCOUNTER — Ambulatory Visit (INDEPENDENT_AMBULATORY_CARE_PROVIDER_SITE_OTHER): Payer: Medicare Other | Admitting: Internal Medicine

## 2020-09-08 VITALS — BP 116/64 | HR 87 | Temp 97.9°F | Ht 61.5 in | Wt 104.0 lb

## 2020-09-08 DIAGNOSIS — I10 Essential (primary) hypertension: Secondary | ICD-10-CM

## 2020-09-08 DIAGNOSIS — I493 Ventricular premature depolarization: Secondary | ICD-10-CM

## 2020-09-08 DIAGNOSIS — K222 Esophageal obstruction: Secondary | ICD-10-CM | POA: Diagnosis not present

## 2020-09-08 DIAGNOSIS — E7849 Other hyperlipidemia: Secondary | ICD-10-CM

## 2020-09-08 DIAGNOSIS — N1831 Chronic kidney disease, stage 3a: Secondary | ICD-10-CM

## 2020-09-08 DIAGNOSIS — Z23 Encounter for immunization: Secondary | ICD-10-CM | POA: Diagnosis not present

## 2020-09-08 DIAGNOSIS — R1314 Dysphagia, pharyngoesophageal phase: Secondary | ICD-10-CM

## 2020-09-08 DIAGNOSIS — M810 Age-related osteoporosis without current pathological fracture: Secondary | ICD-10-CM | POA: Diagnosis not present

## 2020-09-08 LAB — COMPREHENSIVE METABOLIC PANEL
ALT: 9 U/L (ref 0–35)
AST: 19 U/L (ref 0–37)
Albumin: 4.1 g/dL (ref 3.5–5.2)
Alkaline Phosphatase: 49 U/L (ref 39–117)
BUN: 22 mg/dL (ref 6–23)
CO2: 33 mEq/L — ABNORMAL HIGH (ref 19–32)
Calcium: 9.7 mg/dL (ref 8.4–10.5)
Chloride: 102 mEq/L (ref 96–112)
Creatinine, Ser: 0.95 mg/dL (ref 0.40–1.20)
GFR: 53.35 mL/min — ABNORMAL LOW (ref >60.00)
Glucose, Bld: 94 mg/dL (ref 70–99)
Potassium: 4.5 mEq/L (ref 3.5–5.1)
Sodium: 140 mEq/L (ref 135–145)
Total Bilirubin: 0.6 mg/dL (ref 0.2–1.2)
Total Protein: 7.2 g/dL (ref 6.0–8.3)

## 2020-09-08 LAB — CBC WITH DIFFERENTIAL/PLATELET
Basophils Absolute: 0 10*3/uL (ref 0.0–0.1)
Basophils Relative: 0.7 % (ref 0.0–3.0)
Eosinophils Absolute: 0.1 10*3/uL (ref 0.0–0.7)
Eosinophils Relative: 1.9 % (ref 0.0–5.0)
HCT: 39.4 % (ref 36.0–46.0)
Hemoglobin: 13.3 g/dL (ref 12.0–15.0)
Lymphocytes Relative: 37.9 % (ref 12.0–46.0)
Lymphs Abs: 2.4 10*3/uL (ref 0.7–4.0)
MCHC: 33.7 g/dL (ref 30.0–36.0)
MCV: 93.7 fl (ref 78.0–100.0)
Monocytes Absolute: 0.6 10*3/uL (ref 0.1–1.0)
Monocytes Relative: 9 % (ref 3.0–12.0)
Neutro Abs: 3.3 10*3/uL (ref 1.4–7.7)
Neutrophils Relative %: 50.5 % (ref 43.0–77.0)
Platelets: 283 10*3/uL (ref 150.0–400.0)
RBC: 4.2 Mil/uL (ref 3.87–5.11)
RDW: 13.1 % (ref 11.5–15.5)
WBC: 6.5 10*3/uL (ref 4.0–10.5)

## 2020-09-08 LAB — LIPID PANEL
Cholesterol: 188 mg/dL (ref 0–200)
HDL: 64.1 mg/dL (ref >39.00)
LDL Cholesterol: 102 mg/dL — ABNORMAL HIGH (ref 0–99)
NonHDL: 123.57
Total CHOL/HDL Ratio: 3
Triglycerides: 109 mg/dL (ref 0.0–149.0)
VLDL: 21.8 mg/dL (ref 0.0–40.0)

## 2020-09-08 LAB — TSH: TSH: 1.88 u[IU]/mL (ref 0.35–4.50)

## 2020-09-08 NOTE — Assessment & Plan Note (Signed)
Chronic S/p dilation - has some difficulty swallowing, especially liquids - she is monitoring closely -- will contact Dr Wilburn Cornelia when this gets worse - she can do ok for now - advised her to be careful and to see him when needed

## 2020-09-08 NOTE — Assessment & Plan Note (Addendum)
Chronic Cmp, cbc 

## 2020-09-08 NOTE — Assessment & Plan Note (Signed)
Chronic Seeing Dr Wilburn Cornelia now S/p esophageal dilation - will need repeat dilation soon

## 2020-09-08 NOTE — Assessment & Plan Note (Signed)
Chronic Check lipid panel, tsh Diet controlled Regular exercise and healthy diet encouraged

## 2020-09-08 NOTE — Assessment & Plan Note (Signed)
Chronic Sees Dr Lovena Le annually On flecainide 50 mg BID and bisoprolol 2.5 mg daily - continue Cbc, cmp, tsh

## 2020-09-08 NOTE — Assessment & Plan Note (Signed)
Chronic Declines dexa - does not want to take any medication

## 2020-09-08 NOTE — Addendum Note (Signed)
Addended by: Trenda Moots on: 02/10/9986 08:56 AM   Modules accepted: Orders

## 2020-09-08 NOTE — Assessment & Plan Note (Addendum)
Chronic BP well controlled Continue bisoprolol 2.5 mg daily Cmp, cbc

## 2020-09-09 ENCOUNTER — Telehealth: Payer: Self-pay | Admitting: Internal Medicine

## 2020-09-09 NOTE — Telephone Encounter (Signed)
Patient calling back to discuss her lab results  Patient # [phone]

## 2020-09-09 NOTE — Addendum Note (Signed)
Addended by: Marcina Millard on: 09/09/2020 04:54 PM   Modules accepted: Orders

## 2020-09-09 NOTE — Telephone Encounter (Signed)
Labs given.

## 2020-09-10 ENCOUNTER — Other Ambulatory Visit: Payer: Self-pay | Admitting: Internal Medicine

## 2020-11-30 DIAGNOSIS — H35372 Puckering of macula, left eye: Secondary | ICD-10-CM | POA: Diagnosis not present

## 2020-11-30 DIAGNOSIS — D3131 Benign neoplasm of right choroid: Secondary | ICD-10-CM | POA: Diagnosis not present

## 2020-11-30 DIAGNOSIS — H353132 Nonexudative age-related macular degeneration, bilateral, intermediate dry stage: Secondary | ICD-10-CM | POA: Diagnosis not present

## 2021-02-08 ENCOUNTER — Ambulatory Visit (INDEPENDENT_AMBULATORY_CARE_PROVIDER_SITE_OTHER): Payer: Medicare Other | Admitting: Internal Medicine

## 2021-02-08 ENCOUNTER — Other Ambulatory Visit: Payer: Self-pay

## 2021-02-08 ENCOUNTER — Encounter: Payer: Self-pay | Admitting: Internal Medicine

## 2021-02-08 VITALS — BP 126/78 | HR 79 | Ht 61.5 in | Wt 106.6 lb

## 2021-02-08 DIAGNOSIS — I493 Ventricular premature depolarization: Secondary | ICD-10-CM

## 2021-02-08 DIAGNOSIS — I10 Essential (primary) hypertension: Secondary | ICD-10-CM | POA: Diagnosis not present

## 2021-02-08 DIAGNOSIS — R0600 Dyspnea, unspecified: Secondary | ICD-10-CM | POA: Diagnosis not present

## 2021-02-08 DIAGNOSIS — R0609 Other forms of dyspnea: Secondary | ICD-10-CM

## 2021-02-08 MED ORDER — BISOPROLOL FUMARATE 5 MG PO TABS: 2.5000 mg | ORAL_TABLET | Freq: Every day | ORAL | 3 refills | Status: DC

## 2021-02-08 NOTE — Patient Instructions (Addendum)
Medication Instructions:  Your physician recommends that you continue on your current medications as directed. Please refer to the Current Medication list given to you today.  Labwork: None ordered.  Testing/Procedures: Your physician has requested that you have an echocardiogram. Echocardiography is a painless test that uses sound waves to create images of your heart. It provides your doctor with information about the size and shape of your heart and how well your heart's chambers and valves are working. This procedure takes approximately one hour. There are no restrictions for this procedure.  Please schedule for ECHO  Follow-Up: Your physician wants you to follow-up with Cristopher Peru, MD based on results of your ECHO.  Any Other Special Instructions Will Be Listed Below (If Applicable).  If you need a refill on your cardiac medications before your next appointment, please call your pharmacy.

## 2021-02-08 NOTE — Progress Notes (Signed)
HPI Michelle Vazquez returns today for followup of her PVC's and HTN. She is a pleasant 87yo woman with a h/o palpitations who has been placed on flecainide. Her PVC's have resolved. She has c/o difficulty swallowing due to [place] esophagus. Her esophagus has been dilated and her swallowing improved a little. She still has trouble with protein. She is pending EGD. She has lost 4 lbs. She c/o sob with exertion. She has class 3 symptoms. Her last echo was 8 years ago and she had MVP.  Allergies  Allergen Reactions  . Diltiazem Hcl Other (See Comments)    Rapid heartbeat and elevated blood pressure  . Inderal [Propranolol Hcl]     Pt does not remember reaction  . Lopressor [Metoprolol Tartrate]     Per the pt' "I felt like I was going to pass out"  . Penicillins Diarrhea     Current Outpatient Medications  Medication Sig Dispense Refill  . calcium gluconate 500 MG tablet Take 500 mg by mouth daily.    . flecainide (TAMBOCOR) 50 MG tablet Take 1 tablet by mouth twice daily 60 tablet 6  . Multiple Vitamin (MULTIVITAMIN WITH MINERALS) TABS tablet Take 1 tablet by mouth daily.    . Multiple Vitamins-Minerals (VISION-VITE PRESERVE PO) Take 2 tablets by mouth 2 (two) times daily.    . bisoprolol (ZEBETA) 5 MG tablet Take 0.5 tablets (2.5 mg total) by mouth daily. 45 tablet 3   No current facility-administered medications for this visit.     Past Medical History:  Diagnosis Date  . Arthritis   . Atrial fibrillation ([place])   . Bowel obstruction ([place]) 2000  . Cataract    bilateral removed  . Complication of anesthesia    hard to wake after hysterectomy  . HLD (hyperlipidemia)   . Hypercontractile esophagus   . Mitral valve prolapse   . Osteoporosis    declines rx and declines DEXA f/u  . Parathyroid adenoma    post parathyroid surgery in October of 2008  . Paroxysmal supraventricular tachycardia ([place])   . Premature ventricular contractions (PVCs) (VPCs)   . Schatzki's ring  08/2013   Dr. Hilarie Fredrickson, s/p dilation 08/2013, ?benefit  . Squamous cell carcinoma in situ of skin    face  . Urinary incontinence     ROS:   All systems reviewed and negative except as noted in the HPI.   Past Surgical History:  Procedure Laterality Date  . ABDOMINAL HYSTERECTOMY  1989  . BREAST BIOPSY  1979   left  . CATARACT EXTRACTION     bilateral  . ESOPHAGEAL MANOMETRY N/A 11/17/2015   Procedure: ESOPHAGEAL MANOMETRY (EM);  Surgeon: Jerene Bears, MD;  Location: [facility];  Service: Gastroenterology;  Laterality: N/A;  . ESOPHAGOSCOPY  09/17/2019   Procedure: Cervical Esophagoscopy;  Surgeon: Jerrell Belfast, MD;  Location: [facility];  Service: ENT;;  . EYE SURGERY    . LAPAROSCOPIC SMALL BOWEL RESECTION  2000  . MICROLARYNGOSCOPY WITH DILATION N/A 09/17/2019   Procedure: MICROLARYNGOSCOPY WITH DILATION;  Surgeon: Jerrell Belfast, MD;  Location: [facility];  Service: ENT;  Laterality: N/A;  . parathyroid surgery  2008?   left  . SHOULDER SURGERY  02/1999   right  . SKIN LESION EXCISION  08/1999   left side of face     Family History  Problem Relation Age of Onset  . Arthritis Father   . Heart disease Father   . Hypertension Father   . Stroke Father   .  Heart attack Father   . Arthritis Mother   . Stroke Mother   . Stroke Brother   . Diabetes Brother   . Hypertension Brother   . Colon cancer Sister        in her 79's  . Hyperlipidemia Sister   . Hypertension Sister   . Hypertension Sister   . Diabetes Sister   . Esophageal cancer Neg Hx   . Rectal cancer Neg Hx   . Stomach cancer Neg Hx      Social History   Socioeconomic History  . Marital status: Married    Spouse name: Not on file  . Number of children: 4  . Years of education: 12+  . Highest education level: Not on file  Occupational History  . Occupation: Retired    Comment: Research officer, political party   Tobacco Use  . Smoking status: Never Smoker  . Smokeless tobacco: Never Used  Vaping Use  . Vaping  Use: Never used  Substance and Sexual Activity  . Alcohol use: No  . Drug use: No  . Sexual activity: Not Currently  Other Topics Concern  . 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) - retired Adult nurse   Social Determinants of Radio broadcast assistant Strain: Not on file  Food Insecurity: Not on file  Transportation Needs: Not on file  Physical Activity: Not on file  Stress: Not on file  Social Connections: Not on file  Intimate Partner Violence: Not on file     BP 126/78   Pulse 79   Ht 5' 1.5" (1.562 m)   Wt 106 lb 9.6 oz (48.4 kg)   SpO2 95%   BMI 19.82 kg/m   Physical Exam:  Well appearing NAD HEENT: Unremarkable Neck:  No JVD, no thyromegally Lymphatics:  No adenopathy Back:  No CVA tenderness Lungs:  Clear except scattered basilar rales HEART:  Regular rate rhythm, no murmurs, no rubs, no clicks Abd:  soft, positive bowel sounds, no organomegally, no rebound, no guarding Ext:  2 plus pulses, no edema, no cyanosis, no clubbing Skin:  No rashes no nodules Neuro:  CN II through XII intact, motor grossly intact  EKG - nsr with first degree AV block   Assess/Plan: 1. PVC's - she is still asymptomatic. We will continue her flecainide. 2. HTN - her bp is well controlled. She will continue her current meds.  3. Dysphagia - her weight is actually down 4 lbs since her last visit. I encouraged her to be sure to get enough protein. She is pending a probable esophageal dilation. 4. Dyspnea - her symptoms have worsened. I worry about diastolic CHF and/or worsening MR. She will undergo a 2D echo as it has been 8 years.  Carleene Overlie Norah Fick,MD

## 2021-02-14 DIAGNOSIS — R1314 Dysphagia, pharyngoesophageal phase: Secondary | ICD-10-CM | POA: Diagnosis not present

## 2021-02-14 DIAGNOSIS — K222 Esophageal obstruction: Secondary | ICD-10-CM | POA: Diagnosis not present

## 2021-02-18 ENCOUNTER — Other Ambulatory Visit: Payer: Self-pay | Admitting: Otolaryngology

## 2021-02-18 DIAGNOSIS — R1314 Dysphagia, pharyngoesophageal phase: Secondary | ICD-10-CM

## 2021-03-16 ENCOUNTER — Other Ambulatory Visit: Payer: Self-pay

## 2021-03-16 ENCOUNTER — Ambulatory Visit (HOSPITAL_COMMUNITY): Payer: Medicare Other | Attending: Cardiology

## 2021-03-16 DIAGNOSIS — R0609 Other forms of dyspnea: Secondary | ICD-10-CM

## 2021-03-16 DIAGNOSIS — R0600 Dyspnea, unspecified: Secondary | ICD-10-CM

## 2021-03-16 LAB — ECHOCARDIOGRAM COMPLETE
Area-P 1/2: 4.2 cm2
S' Lateral: 2.5 cm

## 2021-03-17 ENCOUNTER — Ambulatory Visit
Admission: RE | Admit: 2021-03-17 | Discharge: 2021-03-17 | Disposition: A | Discharge status: Home / Self Care | Payer: Medicare Other | Source: Ambulatory Visit | Attending: Otolaryngology | Admitting: Otolaryngology

## 2021-03-17 ENCOUNTER — Other Ambulatory Visit: Payer: Self-pay | Admitting: Otolaryngology

## 2021-03-17 DIAGNOSIS — R1314 Dysphagia, pharyngoesophageal phase: Secondary | ICD-10-CM

## 2021-03-17 DIAGNOSIS — R1310 Dysphagia, unspecified: Secondary | ICD-10-CM | POA: Diagnosis not present

## 2021-03-18 ENCOUNTER — Telehealth: Payer: Self-pay

## 2021-03-18 DIAGNOSIS — R0609 Other forms of dyspnea: Secondary | ICD-10-CM

## 2021-03-18 DIAGNOSIS — R0600 Dyspnea, unspecified: Secondary | ICD-10-CM

## 2021-03-18 NOTE — Telephone Encounter (Signed)
-----   Message from Evans Lance, MD sent at 03/16/2021  9:41 PM EDT ----- Heart is strong and her mitral valve leaks. This probably explains her dyspnea. She needs to start lasix 20 mg daily and check a bmp in 2 weeks. GT

## 2021-03-21 MED ORDER — FUROSEMIDE 20 MG PO TABS: 20.0000 mg | ORAL_TABLET | Freq: Every day | ORAL | 3 refills | Status: DC

## 2021-03-21 NOTE — Telephone Encounter (Signed)
Left detailed message for Pt.  Advised of Echo results.  Advised per Dr. Truddie Coco lasix 20 mg one tablet by mouth daily  Repeat BMP in 2 weeks.  Requested call back to schedule lab work.

## 2021-03-21 NOTE — Telephone Encounter (Signed)
Returned call to Pt.  Discussed echo report findings and Dr. Tanna Furry recommendations.  Plan-will mail echo results to Pt with Dr. Tanna Furry recs.  She will discuss with her daughter and call this nurse back if she would like to try lasix.  Pt thanked nurse for call.

## 2021-03-21 NOTE — Telephone Encounter (Signed)
Patient is returning call.  °

## 2021-03-25 ENCOUNTER — Telehealth: Payer: Self-pay | Admitting: Internal Medicine

## 2021-03-25 DIAGNOSIS — R1314 Dysphagia, pharyngoesophageal phase: Secondary | ICD-10-CM | POA: Diagnosis not present

## 2021-03-25 DIAGNOSIS — K222 Esophageal obstruction: Secondary | ICD-10-CM | POA: Diagnosis not present

## 2021-03-25 NOTE — Telephone Encounter (Signed)
Pt c/o medication issue:  1. Name of Medication: LASIX  2. How are you currently taking this medication (dosage and times per day)? N/A  3. Are you having a reaction (difficulty breathing--STAT)? N/A  4. What is your medication issue?  PT RETURNING CALL TO SPEAK W/ DR. Forde Dandy NURSE, PT IS NOT WANTING TO TAKE RX LASIX DUE TO BEING BORDERLINE ANEMIC.

## 2021-03-25 NOTE — Telephone Encounter (Signed)
I spoke with patient. She is calling back to let Dr Lovena Le and Sonia Baller know she does not want to start lasix at this time.  She has trouble with her throat being narrow and she can only take small amounts of liquids.  She is concerned about becoming dehydrated

## 2021-03-28 NOTE — Telephone Encounter (Signed)
Noted. Maintain a very slow sodium diet.

## 2021-03-30 NOTE — Telephone Encounter (Signed)
Left detailed message per DPR.  Advised had received Pt's message.  Advised this was ok.  Advised to limit sodium.  Call back if any further questions/needs.

## 2021-04-15 ENCOUNTER — Other Ambulatory Visit: Payer: Self-pay | Admitting: Internal Medicine

## 2021-05-30 DIAGNOSIS — H35372 Puckering of macula, left eye: Secondary | ICD-10-CM | POA: Diagnosis not present

## 2021-05-30 DIAGNOSIS — D3131 Benign neoplasm of right choroid: Secondary | ICD-10-CM | POA: Diagnosis not present

## 2021-05-30 DIAGNOSIS — H353132 Nonexudative age-related macular degeneration, bilateral, intermediate dry stage: Secondary | ICD-10-CM | POA: Diagnosis not present

## 2021-06-13 ENCOUNTER — Ambulatory Visit (INDEPENDENT_AMBULATORY_CARE_PROVIDER_SITE_OTHER): Payer: Medicare Other

## 2021-06-13 DIAGNOSIS — Z0000 Encounter for general adult medical examination without abnormal findings: Secondary | ICD-10-CM

## 2021-06-13 NOTE — Patient Instructions (Addendum)
Michelle Vazquez , Thank you for taking time to come for your Medicare Wellness Visit. I appreciate your ongoing commitment to your health goals. Please review the following plan we discussed and let me know if I can assist you in the future.   Screening recommendations/referrals: Colonoscopy: Not a candidate for screening due to age. Mammogram: last done 07/22/2015 Bone Density: never done Recommended yearly ophthalmology/optometry visit for glaucoma screening and checkup Recommended yearly dental visit for hygiene and checkup  Vaccinations: Influenza vaccine: 09/09/2020 Pneumococcal vaccine: 06/21/2012, 08/25/2015 Tdap vaccine: 11/06/2009; due every  10 years (Overdue) Shingles vaccine: never done; Please call your insurance company to determine your out of pocket expense for the Shingrix vaccine. You may receive this vaccine at your local pharmacy. Covid-19: 11/27/2019, 12/18/2019, 08/25/2020  Advanced directives: Please bring a copy of your health care power of attorney and living will to the office at your convenience.  Conditions/risks identified: Yes; Client understands the importance of follow-up with providers by attending scheduled visits and discussed goals to eat healthier, increase physical activity, exercise the brain, socialize more, get enough sleep and make time for laughter.  Next appointment: Please schedule your next Medicare Wellness Visit with your Nurse Health Advisor in 1 year by calling [phone].   Preventive Care 22 Years and Older, Female Preventive care refers to lifestyle choices and visits with your health care provider that can promote health and wellness. What does preventive care include? A yearly physical exam. This is also called an annual well check. Dental exams once or twice a year. Routine eye exams. Ask your health care provider how often you should have your eyes checked. Personal lifestyle choices, including: Daily care of your teeth and gums. Regular  physical activity. Eating a healthy diet. Avoiding tobacco and drug use. Limiting alcohol use. Practicing safe sex. Taking low-dose aspirin every day. Taking vitamin and mineral supplements as recommended by your health care provider. What happens during an annual well check? The services and screenings done by your health care provider during your annual well check will depend on your age, overall health, lifestyle risk factors, and family history of disease. Counseling  Your health care provider may ask you questions about your: Alcohol use. Tobacco use. Drug use. Emotional well-being. Home and relationship well-being. Sexual activity. Eating habits. History of falls. Memory and ability to understand (cognition). Work and work Statistician. Reproductive health. Screening  You may have the following tests or measurements: Height, weight, and BMI. Blood pressure. Lipid and cholesterol levels. These may be checked every 5 years, or more frequently if you are over 5 years old. Skin check. Lung cancer screening. You may have this screening every year starting at age 40 if you have a 30-pack-year history of smoking and currently smoke or have quit within the past 15 years. Fecal occult blood test (FOBT) of the stool. You may have this test every year starting at age 41. Flexible sigmoidoscopy or colonoscopy. You may have a sigmoidoscopy every 5 years or a colonoscopy every 10 years starting at age 4. Hepatitis C blood test. Hepatitis B blood test. Sexually transmitted disease (STD) testing. Diabetes screening. This is done by checking your blood sugar (glucose) after you have not eaten for a while (fasting). You may have this done every 1-3 years. Bone density scan. This is done to screen for osteoporosis. You may have this done starting at age 41. Mammogram. This may be done every 1-2 years. Talk to your health care provider about how often you  should have regular mammograms. Talk  with your health care provider about your test results, treatment options, and if necessary, the need for more tests. Vaccines  Your health care provider may recommend certain vaccines, such as: Influenza vaccine. This is recommended every year. Tetanus, diphtheria, and acellular pertussis (Tdap, Td) vaccine. You may need a Td booster every 10 years. Zoster vaccine. You may need this after age 55. Pneumococcal 13-valent conjugate (PCV13) vaccine. One dose is recommended after age 17. Pneumococcal polysaccharide (PPSV23) vaccine. One dose is recommended after age 18. Talk to your health care provider about which screenings and vaccines you need and how often you need them. This information is not intended to replace advice given to you by your health care provider. Make sure you discuss any questions you have with your health care provider. Document Released: 11/19/2015 Document Revised: 07/12/2016 Document Reviewed: 08/24/2015 Elsevier Interactive Patient Education  2017 [place] Prevention in the Home Falls can cause injuries. They can happen to people of all ages. There are many things you can do to make your home safe and to help prevent falls. What can I do on the outside of my home? Regularly fix the edges of walkways and driveways and fix any cracks. Remove anything that might make you trip as you walk through a door, such as a raised step or threshold. Trim any bushes or trees on the path to your home. Use bright outdoor lighting. Clear any walking paths of anything that might make someone trip, such as rocks or tools. Regularly check to see if handrails are loose or broken. Make sure that both sides of any steps have handrails. Any raised decks and porches should have guardrails on the edges. Have any leaves, snow, or ice cleared regularly. Use sand or salt on walking paths during winter. Clean up any spills in your garage right away. This includes oil or grease  spills. What can I do in the bathroom? Use night lights. Install grab bars by the toilet and in the tub and shower. Do not use towel bars as grab bars. Use non-skid mats or decals in the tub or shower. If you need to sit down in the shower, use a plastic, non-slip stool. Keep the floor dry. Clean up any water that spills on the floor as soon as it happens. Remove soap buildup in the tub or shower regularly. Attach bath mats securely with double-sided non-slip rug tape. Do not have throw rugs and other things on the floor that can make you trip. What can I do in the bedroom? Use night lights. Make sure that you have a light by your bed that is easy to reach. Do not use any sheets or blankets that are too big for your bed. They should not hang down onto the floor. Have a firm chair that has side arms. You can use this for support while you get dressed. Do not have throw rugs and other things on the floor that can make you trip. What can I do in the kitchen? Clean up any spills right away. Avoid walking on wet floors. Keep items that you use a lot in easy-to-reach places. If you need to reach something above you, use a strong step stool that has a grab bar. Keep electrical cords out of the way. Do not use floor polish or wax that makes floors slippery. If you must use wax, use non-skid floor wax. Do not have throw rugs and other things on the  floor that can make you trip. What can I do with my stairs? Do not leave any items on the stairs. Make sure that there are handrails on both sides of the stairs and use them. Fix handrails that are broken or loose. Make sure that handrails are as long as the stairways. Check any carpeting to make sure that it is firmly attached to the stairs. Fix any carpet that is loose or worn. Avoid having throw rugs at the top or bottom of the stairs. If you do have throw rugs, attach them to the floor with carpet tape. Make sure that you have a light switch at the  top of the stairs and the bottom of the stairs. If you do not have them, ask someone to add them for you. What else can I do to help prevent falls? Wear shoes that: Do not have high heels. Have rubber bottoms. Are comfortable and fit you well. Are closed at the toe. Do not wear sandals. If you use a stepladder: Make sure that it is fully opened. Do not climb a closed stepladder. Make sure that both sides of the stepladder are locked into place. Ask someone to hold it for you, if possible. Clearly mark and make sure that you can see: Any grab bars or handrails. First and last steps. Where the edge of each step is. Use tools that help you move around (mobility aids) if they are needed. These include: Canes. Walkers. Scooters. Crutches. Turn on the lights when you go into a dark area. Replace any light bulbs as soon as they burn out. Set up your furniture so you have a clear path. Avoid moving your furniture around. If any of your floors are uneven, fix them. If there are any pets around you, be aware of where they are. Review your medicines with your doctor. Some medicines can make you feel dizzy. This can increase your chance of falling. Ask your doctor what other things that you can do to help prevent falls. This information is not intended to replace advice given to you by your health care provider. Make sure you discuss any questions you have with your health care provider. Document Released: 08/19/2009 Document Revised: 03/30/2016 Document Reviewed: 11/27/2014 Elsevier Interactive Patient Education  2017 [employer].

## 2021-06-13 NOTE — Progress Notes (Signed)
I connected with Claria Dice today by telephone and verified that I am speaking with the correct person using two identifiers. Location patient: home Location provider: work Persons participating in the virtual visit: Michelle Vazquez and Michelle Abu, LPN.   I discussed the limitations, risks, security and privacy concerns of performing an evaluation and management service by telephone and the availability of in person appointments. I also discussed with the patient that there may be a patient responsible charge related to this service. The patient expressed understanding and verbally consented to this telephonic visit.    Interactive audio and video telecommunications were attempted between this provider and patient, however failed, due to patient having technical difficulties OR patient did not have access to video capability.  We continued and completed visit with audio only.  Some vital signs may be absent or patient reported.   Time Spent with patient on telephone encounter: 30 minutes  Subjective:   Michelle Vazquez is a 87 y.o. female who presents for Medicare Annual (Subsequent) preventive examination.  Review of Systems     Cardiac Risk Factors include: advanced age (>82mn, >>50women);dyslipidemia;family history of premature cardiovascular disease;hypertension     Objective:    There were no vitals filed for this visit. There is no height or weight on file to calculate BMI.  Advanced Directives 06/13/2021 09/15/2019  Does Patient Have a Medical Advance Directive? Yes No  Type of Advance Directive Living will;Healthcare Power of Attorney -  Does patient want to make changes to medical advance directive? No - Patient declined -  Copy of [place] Chart? No - copy requested -  Would patient like information on creating a medical advance directive? - No - Patient declined    Current Medications (verified) Outpatient Encounter Medications as of 06/13/2021   Medication Sig   bisoprolol (ZEBETA) 5 MG tablet Take 0.5 tablets (2.5 mg total) by mouth daily.   calcium gluconate 500 MG tablet Take 500 mg by mouth daily.   flecainide (TAMBOCOR) 50 MG tablet Take 1 tablet by mouth twice daily   Multiple Vitamin (MULTIVITAMIN WITH MINERALS) TABS tablet Take 1 tablet by mouth daily.   Multiple Vitamins-Minerals (VISION-VITE PRESERVE PO) Take 2 tablets by mouth 2 (two) times daily.   No facility-administered encounter medications on file as of 06/13/2021.    Allergies (verified) Diltiazem hcl, Inderal [propranolol hcl], Lopressor [metoprolol tartrate], and Penicillins   History: Past Medical History:  Diagnosis Date   Arthritis    Atrial fibrillation (HCC)    Bowel obstruction ([place] 2000   Cataract    bilateral removed   Complication of anesthesia    hard to wake after hysterectomy   HLD (hyperlipidemia)    Hypercontractile esophagus    Mitral valve prolapse    Osteoporosis    declines rx and declines DEXA f/u   Parathyroid adenoma    post parathyroid surgery in October of 2008   Paroxysmal supraventricular tachycardia ([facility]    Premature ventricular contractions (PVCs) (VPCs)    Schatzki's ring 08/2013   Dr. PHilarie Fredrickson s/p dilation 08/2013, ?benefit   Squamous cell carcinoma in situ of skin    face   Urinary incontinence    Past Surgical History:  Procedure Laterality Date   ABDOMINAL HYSTERECTOMY  1989   BREAST BIOPSY  1979   left   CATARACT EXTRACTION     bilateral   ESOPHAGEAL MANOMETRY N/A 11/17/2015   Procedure: ESOPHAGEAL MANOMETRY (EM);  Surgeon: JJerene Bears MD;  Location: [facility];  Service: Gastroenterology;  Laterality: N/A;   ESOPHAGOSCOPY  09/17/2019   Procedure: Cervical Esophagoscopy;  Surgeon: Jerrell Belfast, MD;  Location: [facility];  Service: ENT;;   EYE SURGERY     LAPAROSCOPIC SMALL BOWEL RESECTION  2000   MICROLARYNGOSCOPY WITH DILATION N/A 09/17/2019   Procedure: MICROLARYNGOSCOPY WITH DILATION;  Surgeon:  Jerrell Belfast, MD;  Location: [facility];  Service: ENT;  Laterality: N/A;   parathyroid surgery  2008?   left   SHOULDER SURGERY  02/1999   right   SKIN LESION EXCISION  08/1999   left side of face   Family History  Problem Relation Age of Onset   Arthritis Father    Heart disease Father    Hypertension Father    Stroke Father    Heart attack Father    Arthritis Mother    Stroke Mother    Stroke Brother    Diabetes Brother    Hypertension Brother    Colon cancer Sister        in her 5's   Hyperlipidemia Sister    Hypertension Sister    Hypertension Sister    Diabetes Sister    Esophageal cancer Neg Hx    Rectal cancer Neg Hx    Stomach cancer Neg Hx    Social History   Socioeconomic History   Marital status: Married    Spouse name: Not on file   Number of children: 4   Years of education: 12+   Highest education level: Not on file  Occupational History   Occupation: Retired    Comment: Research officer, political party   Tobacco Use   Smoking status: Never   Smokeless tobacco: Never  Vaping Use   Vaping Use: Never used  Substance and Sexual Activity   Alcohol use: No   Drug use: No   Sexual activity: Not Currently  Other Topics Concern   Not on file  Social History Narrative   Regular exercise-sometimes   Caffeine Use-no   married x 2, caregiver of 77yo husband (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) - retired Adult nurse   Social Determinants of Radio broadcast assistant Strain: Low Risk    Difficulty of Paying Living Expenses: Not hard at [place]: No Food Insecurity   Worried About Charity fundraiser in the Last Year: Never true   Arboriculturist in the Last Year: Never true  Transportation Needs: No Transportation Needs   Lack of Transportation (Medical): No   Lack of Transportation (Non-Medical): No  Physical Activity: Sufficiently Active   Days of Exercise per Week: 5 days   Minutes of Exercise per Session: 30 min  Stress: No Stress Concern Present   Feeling of  Stress : Not at all  Social Connections: Moderately Integrated   Frequency of Communication with Friends and Family: More than three times a week   Frequency of Social Gatherings with Friends and Family: Once a week   Attends Religious Services: More than 4 times per year   Active Member of [employer] or Organizations: No   Attends Music therapist: More than 4 times per year   Marital Status: Widowed    Tobacco Counseling Counseling given: Not Answered   Clinical Intake:  Pre-visit preparation completed: Yes  Pain : No/denies pain     Nutritional Risks: None Diabetes: No  How often do you need to have someone help you when you read instructions, pamphlets, or other written materials from your doctor  or pharmacy?: 1 - Never What is the last grade level you completed in school?: year of college; Xray courses  Diabetic? no  Interpreter Needed?: No  Information entered by :: Michelle Abu, LPN   Activities of Daily Living In your present state of health, do you have any difficulty performing the following activities: 06/13/2021 09/08/2020  Hearing? N N  Vision? Y N  Difficulty concentrating or making decisions? N N  Walking or climbing stairs? N N  Dressing or bathing? N N  Doing errands, shopping? N N  Preparing Food and eating ? N -  Using the Toilet? N -  In the past six months, have you accidently leaked urine? Y -  Do you have problems with loss of bowel control? N -  Managing your Medications? N -  Managing your Finances? N -  Housekeeping or managing your Housekeeping? N -  Some recent data might be hidden    Patient Care Team: Binnie Rail, MD as PCP - General (Internal Medicine) Darlin Coco, MD (Cardiology) Pyrtle, Lajuan Lines, MD (Gastroenterology) Evans Lance, MD (Cardiology)  Indicate any recent Medical Services you may have received from other than [facility] providers in the past year (date may be approximate).     Assessment:   This  is a routine wellness examination for Larren.  Hearing/Vision screen Hearing Screening - Comments:: Patient denied any hearing difficulty. Vision Screening - Comments:: Patient uses glasses.  Has dry macular degeneration.  Eye exams done by Dr. Julian Reil and Dr. Princess Bruins.  Dietary issues and exercise activities discussed: Current Exercise Habits: Home exercise routine, Type of exercise: walking;Other - see comments (yard work, house work; very active), Time (Minutes): 30, Frequency (Times/Week): 5, Weekly Exercise (Minutes/Week): 150, Intensity: Moderate, Exercise limited by: None identified   Goals Addressed             This Visit's Progress    Patient Stated       Continue to maintain my independence and stay active.      Depression Screen PHQ 2/9 Scores 06/13/2021 09/08/2020 09/08/2019 09/06/2018 09/04/2017 09/04/2017 08/28/2016  PHQ - 2 Score 0 0 0 0 0 0 0  PHQ- 9 Score - - - 0 - - -    Fall Risk Fall Risk  06/13/2021 09/08/2020 09/08/2019 09/20/2018 09/06/2018  Falls in the past year? 0 0 0 0 0  Comment - - - Emmi Telephone Survey: data to providers prior to load -  Number falls in past yr: 0 0 0 - -  Injury with Fall? 0 0 - - -  Risk for fall due to : No Fall Risks No Fall Risks - - -  Risk for fall due to: Comment - - - - -  Follow up Falls evaluation completed Falls evaluation completed - - -    FALL RISK PREVENTION PERTAINING TO THE HOME:  Any stairs in or around the home? No  If so, are there any without handrails? No  Home free of loose throw rugs in walkways, pet beds, electrical cords, etc? Yes  Adequate lighting in your home to reduce risk of falls? Yes   ASSISTIVE DEVICES UTILIZED TO PREVENT FALLS:  Life alert? Yes  Use of a cane, walker or w/c? No  Grab bars in the bathroom? Yes  Shower chair or bench in shower? Yes  Elevated toilet seat or a handicapped toilet? Yes   TIMED UP AND GO:  Was the test performed? No .  Length of  time to ambulate 10  feet: n/a sec.   Gait steady and fast without use of assistive device (Patient stated)  Cognitive Function: No flowsheet data found.         Immunizations Immunization History  Administered Date(s) Administered   Fluad Quad(high Dose 65+) 09/08/2019, 09/08/2020   Influenza, High Dose Seasonal PF 08/04/2014, 08/28/2016, 09/04/2017, 09/06/2018   Influenza,inj,Quad PF,6+ Mos 08/04/2013, 08/25/2015   PFIZER(Purple Top)SARS-COV-2 Vaccination 11/27/2019, 12/18/2019, 08/25/2020   Pneumococcal Conjugate-13 08/25/2015   Pneumococcal Polysaccharide-23 06/21/2012   Td 11/06/2009   Zoster, Live 06/30/2011    TDAP status: Due, Education has been provided regarding the importance of this vaccine. Advised may receive this vaccine at local pharmacy or Health Dept. Aware to provide a copy of the vaccination record if obtained from local pharmacy or Health Dept. Verbalized acceptance and understanding.  Flu Vaccine status: Up to date  Pneumococcal vaccine status: Up to date  Covid-19 vaccine status: Completed vaccines  Qualifies for Shingles Vaccine? Yes   Zostavax completed Yes   Shingrix Completed?: No.    Education has been provided regarding the importance of this vaccine. Patient has been advised to call insurance company to determine out of pocket expense if they have not yet received this vaccine. Advised may also receive vaccine at local pharmacy or Health Dept. Verbalized acceptance and understanding.  Screening Tests Health Maintenance  Topic Date Due   Zoster Vaccines- Shingrix (1 of 2) Never done   COVID-19 Vaccine (4 - Booster for Pfizer series) 12/26/2020   INFLUENZA VACCINE  06/06/2021   TETANUS/TDAP  09/08/2021 (Originally 11/07/2019)   DEXA SCAN  11/06/2028 (Originally 01/16/2002)   PNA vac Low Risk Adult  Completed   HPV VACCINES  Aged Out    Health Maintenance  Health Maintenance Due  Topic Date Due   Zoster Vaccines- Shingrix (1 of 2) Never done   COVID-19 Vaccine  (4 - Booster for Pfizer series) 12/26/2020   INFLUENZA VACCINE  06/06/2021    Colorectal cancer screening: No longer required.   Mammogram status: No longer required due to age.  Bone density status: never done/postponed  Lung Cancer Screening: (Low Dose CT Chest recommended if Age 24-80 years, 30 pack-year currently smoking OR have quit w/in 15years.) does not qualify.   Lung Cancer Screening Referral: no  Additional Screening:  Hepatitis C Screening: does not qualify; Completed no  Vision Screening: Recommended annual ophthalmology exams for early detection of glaucoma and other disorders of the eye. Is the patient up to date with their annual eye exam?  Yes  Who is the provider or what is the name of the office in which the patient attends annual eye exams? Dr. Julian Reil and Dr. Princess Bruins If pt is not established with a provider, would they like to be referred to a provider to establish care? No .   Dental Screening: Recommended annual dental exams for proper oral hygiene  Community Resource Referral / Chronic Care Management: CRR required this visit?  No   CCM required this visit?  No      Plan:     I have personally reviewed and noted the following in the patient's chart:   Medical and social history Use of alcohol, tobacco or illicit drugs  Current medications and supplements including opioid prescriptions.  Functional ability and status Nutritional status Physical activity Advanced directives List of other physicians Hospitalizations, surgeries, and ER visits in previous 12 months Vitals Screenings to include cognitive, depression, and falls Referrals and appointments  In addition, I have reviewed and discussed with patient certain preventive protocols, quality metrics, and best practice recommendations. A written personalized care plan for preventive services as well as general preventive health recommendations were provided to patient.      Sheral Flow, LPN   [id number]   Nurse Notes:  Patient is cogitatively intact. There were no vitals filed for this visit. There is no height or weight on file to calculate BMI. Patient stated that she has no issues with gait or balance; does not use any assistive devices. Medications reviewed with patient; no opioid use noted.

## 2021-07-08 DIAGNOSIS — H353131 Nonexudative age-related macular degeneration, bilateral, early dry stage: Secondary | ICD-10-CM | POA: Diagnosis not present

## 2021-07-08 DIAGNOSIS — H35372 Puckering of macula, left eye: Secondary | ICD-10-CM | POA: Diagnosis not present

## 2021-08-10 IMAGING — RF DG ESOPHAGUS
9 of 10 series · 14 of 24 positions shown · non-contrast
Comparison: None available at the time of dictation.

CLINICAL DATA: Difficulty swallowing, sensation of food sticking.

EXAM:
ESOPHOGRAM / BARIUM SWALLOW / BARIUM TABLET STUDY
TECHNIQUE: Combined double contrast and single contrast examination performed
using effervescent crystals, thick barium liquid, and thin barium
liquid. The patient was observed with fluoroscopy swallowing a 13 mm
barium sulphate tablet.
FLUOROSCOPY TIME:  Fluoroscopy Time:  2 minutes 30 seconds
Radiation Exposure Index (if provided by the fluoroscopic device):
38 mGy
Number of Acquired Spot Images: 22

[Series 1: sequence · 0.31mm/px · 1 of 5 frames shown (1 of 6)]
[frame 1/5]
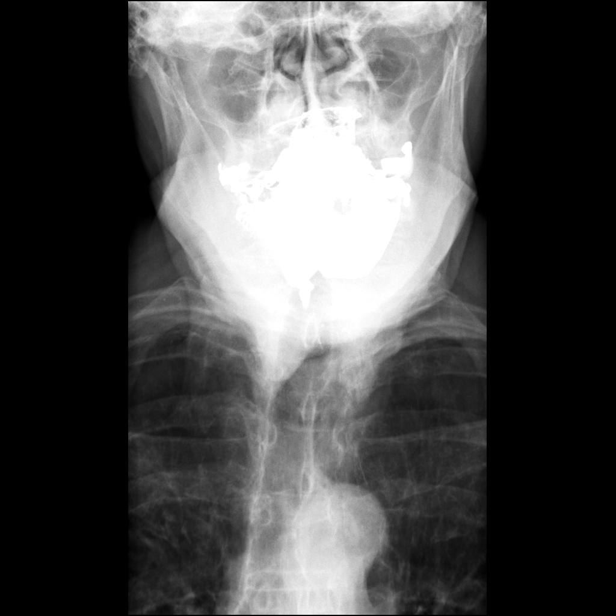

[Series 2: one shot · 1 of 1 slices shown (1 of 3)]
[im 1/1]
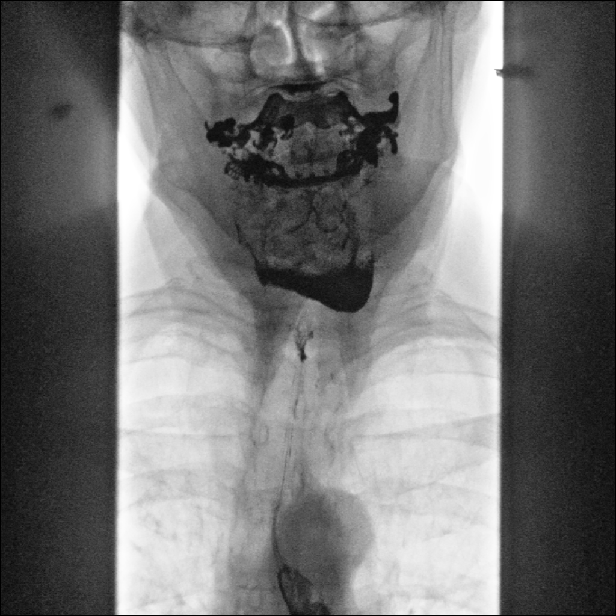

[Series 3: sequence · 0.31mm/px · 1 of 6 frames shown (2 of 6)]
[frame 4/6]
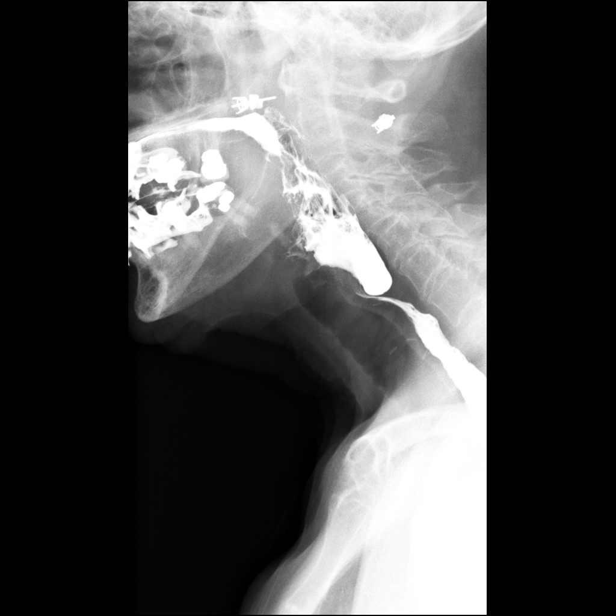

[Series 4: one shot · 2 of 3 slices shown (2 of 3)]
[im 2/3]
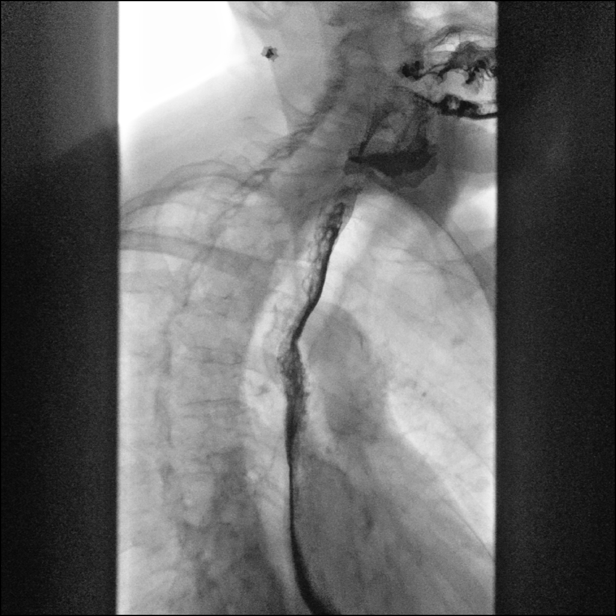
[im 3/3]
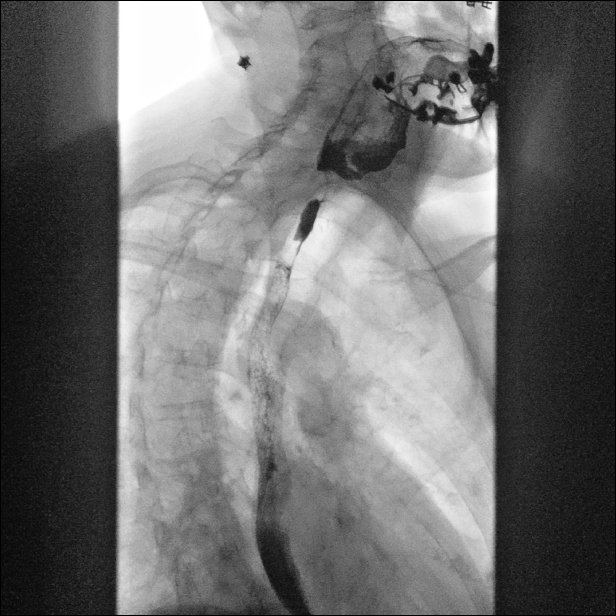

[Series 5: sequence · 0.28mm/px · 1 of 5 frames shown (3 of 6)]
[frame 5/5]
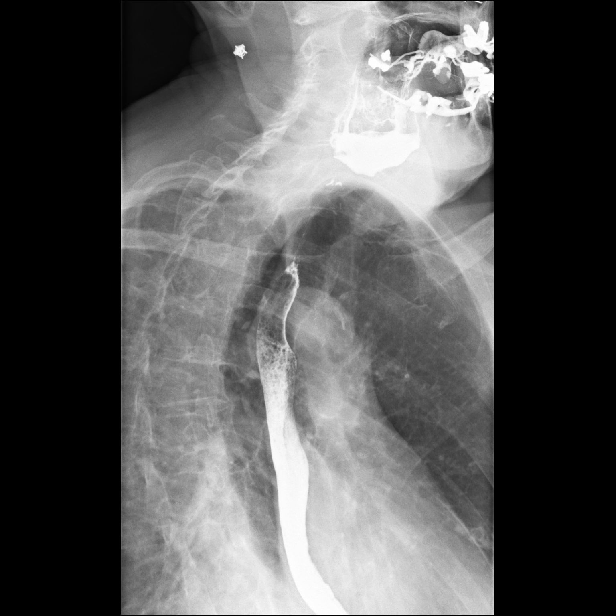

[Series 7: sequence · 2 of 8 frames shown (4 of 6)]
[frame 5/8]
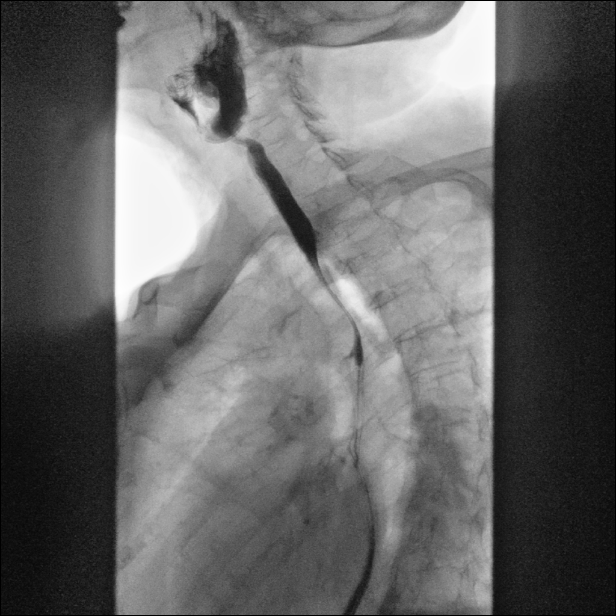
[frame 8/8]
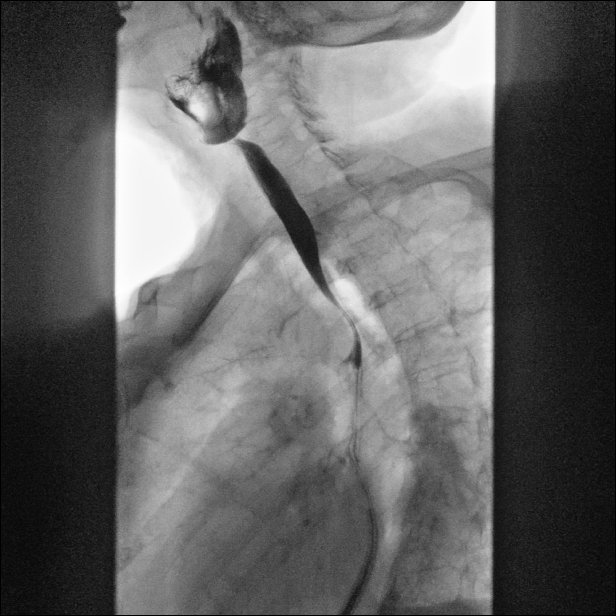

[Series 9: sequence · 1 of 58 frames shown (5 of 6)]
[frame 30/58]
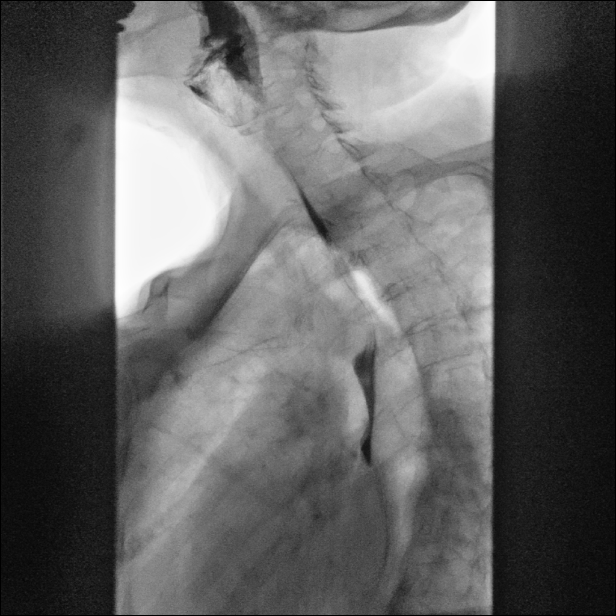

[Series 10: sequence · 0.28mm/px · 1 of 6 frames shown (6 of 6)]
[frame 1/6]
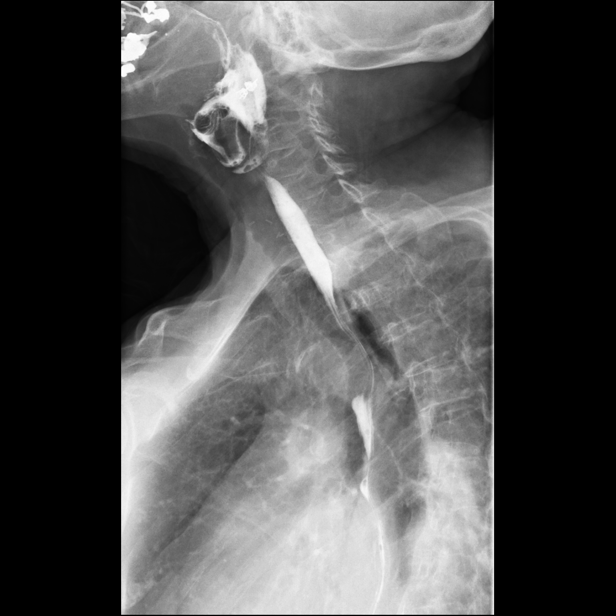

[Series 11: one shot · 4 of 8 slices shown (3 of 3)]
[im 1/8]
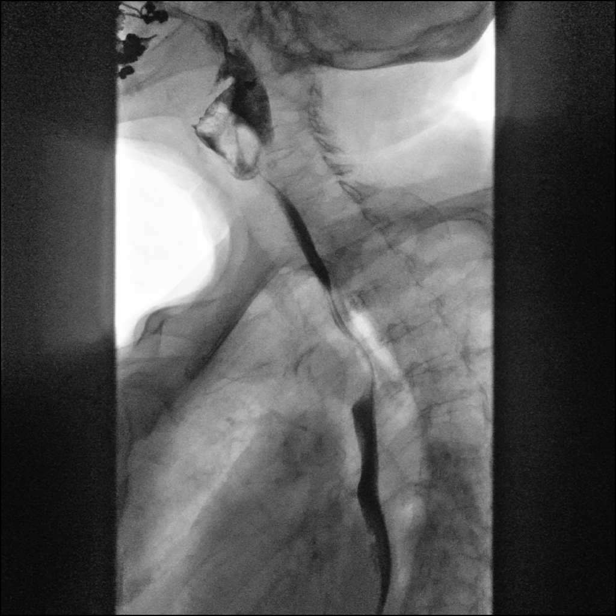
[im 2/8]
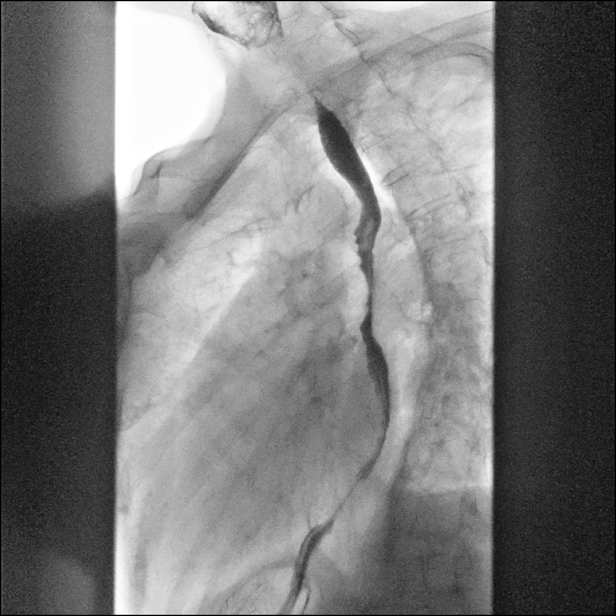
[im 5/8]
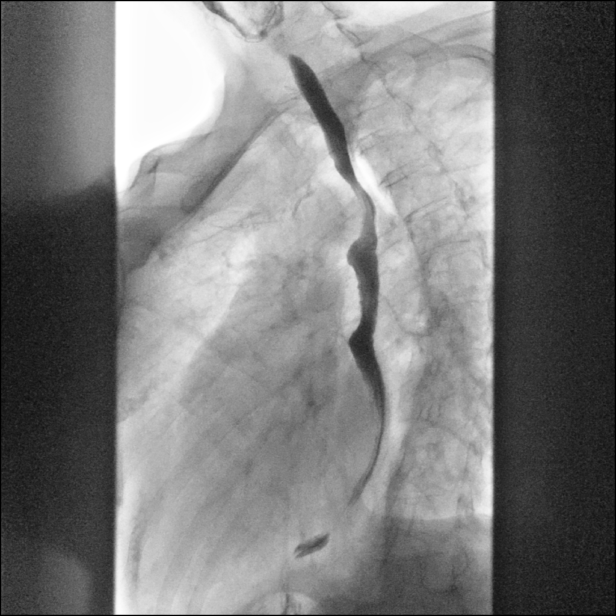
[im 8/8]
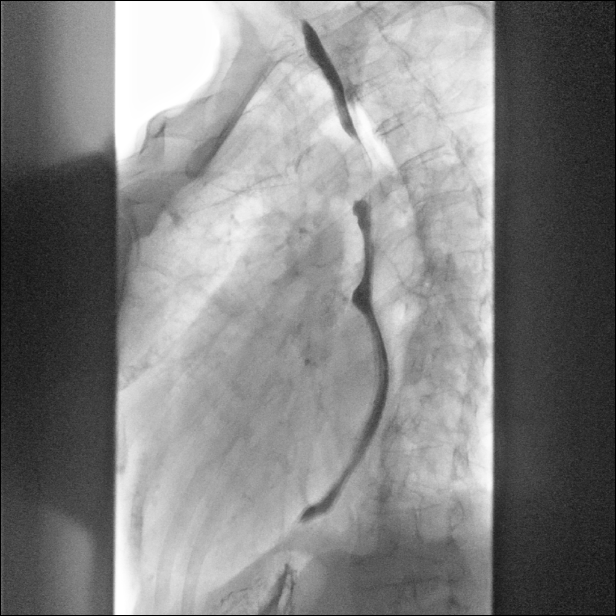

[14 of 24 positions shown; findings below may reference images not displayed]

FINDINGS: Piriform sinus dilatation secondary to a high-grade stricture of the
cervical esophagus. There is decreased esophageal motility. No
esophageal fold thickening. Esophagus is otherwise unremarkable.
Patient was unable to swallow a pill beyond the oropharynx.
IMPRESSION: 1. High-grade stricture of the cervical esophagus with associated
piriform sinus dilatation.
2. Esophageal dysmotility.

## 2021-09-11 NOTE — Progress Notes (Signed)
Subjective:    Patient ID: Michelle Vazquez, female    DOB: 08/03/1929, 87 y.o.   MRN: [id number]  This visit occurred during the SARS-CoV-2 public health emergency.  Safety protocols were in place, including screening questions prior to the visit, additional usage of staff PPE, and extensive cleaning of exam room while observing appropriate contact time as indicated for disinfecting solutions.     HPI The patient is here for follow up of their chronic medical problems, including hypertension, PVCs, chronic kidney disease  She coughs more with eating.  At night she wakes up with saliva in her throat.    For exercise-Yard work, house work - walking video in winter  Medications and allergies reviewed with patient and updated if appropriate.  Patient Active Problem List   Diagnosis Date Noted   CKD (chronic kidney disease) stage 3, GFR 30-59 ml/min (HCC) 09/07/2020   Hypertension 09/07/2019   Esophageal stenosis 09/01/2019   Cough 09/04/2017   Family history of diabetes mellitus (DM) 08/28/2016   Osteoporosis    Pharyngoesophageal dysphagia 08/28/2013   Arthritis    HLD (hyperlipidemia)    Unifocal PVCs 06/21/2011   Mitral valve prolapse 06/21/2011   Parathyroid adenoma 06/21/2011    Current Outpatient Medications on File Prior to Visit  Medication Sig Dispense Refill   bisoprolol (ZEBETA) 5 MG tablet Take 0.5 tablets (2.5 mg total) by mouth daily. 45 tablet 3   calcium gluconate 500 MG tablet Take 500 mg by mouth daily.     flecainide (TAMBOCOR) 50 MG tablet Take 1 tablet by mouth twice daily 180 tablet 3   Multiple Vitamin (MULTIVITAMIN WITH MINERALS) TABS tablet Take 1 tablet by mouth daily.     Multiple Vitamins-Minerals (VISION-VITE PRESERVE PO) Take 2 tablets by mouth 2 (two) times daily.     No current facility-administered medications on file prior to visit.    Past Medical History:  Diagnosis Date   Arthritis    Atrial fibrillation ([place])    Bowel obstruction  ([place]) 2000   Cataract    bilateral removed   Complication of anesthesia    hard to wake after hysterectomy   HLD (hyperlipidemia)    Hypercontractile esophagus    Mitral valve prolapse    Osteoporosis    declines rx and declines DEXA f/u   Parathyroid adenoma    post parathyroid surgery in October of 2008   Paroxysmal supraventricular tachycardia ([place])    Premature ventricular contractions (PVCs) (VPCs)    Schatzki's ring 08/2013   Dr. Hilarie Fredrickson, s/p dilation 08/2013, ?benefit   Squamous cell carcinoma in situ of skin    face   Urinary incontinence     Past Surgical History:  Procedure Laterality Date   ABDOMINAL HYSTERECTOMY  1989   BREAST BIOPSY  1979   left   CATARACT EXTRACTION     bilateral   ESOPHAGEAL MANOMETRY N/A 11/17/2015   Procedure: ESOPHAGEAL MANOMETRY (EM);  Surgeon: Jerene Bears, MD;  Location: [facility];  Service: Gastroenterology;  Laterality: N/A;   ESOPHAGOSCOPY  09/17/2019   Procedure: Cervical Esophagoscopy;  Surgeon: Jerrell Belfast, MD;  Location: [place];  Service: ENT;;   EYE SURGERY     LAPAROSCOPIC SMALL BOWEL RESECTION  2000   MICROLARYNGOSCOPY WITH DILATION N/A 09/17/2019   Procedure: MICROLARYNGOSCOPY WITH DILATION;  Surgeon: Jerrell Belfast, MD;  Location: [place];  Service: ENT;  Laterality: N/A;   parathyroid surgery  2008?   left   SHOULDER SURGERY  02/1999   right   SKIN LESION EXCISION  08/1999   left side of face    Social History   Socioeconomic History   Marital status: Married    Spouse name: Not on file   Number of children: 4   Years of education: 12+   Highest education level: Not on file  Occupational History   Occupation: Retired    Comment: Research officer, political party   Tobacco Use   Smoking status: Never   Smokeless tobacco: Never  Vaping Use   Vaping Use: Never used  Substance and Sexual Activity   Alcohol use: No   Drug use: No   Sexual activity: Not Currently  Other Topics Concern   Not on file  Social History  Narrative   Regular exercise-sometimes   Caffeine Use-no   married x 2, caregiver of 14yo husband (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) - retired Adult nurse   Social Determinants of Radio broadcast assistant Strain: Low Risk    Difficulty of Paying Living Expenses: Not hard at all  Food Insecurity: No Food Insecurity   Worried About Charity fundraiser in the Last Year: Never true   Arboriculturist in the Last Year: Never true  Transportation Needs: No Transportation Needs   Lack of Transportation (Medical): No   Lack of Transportation (Non-Medical): No  Physical Activity: Sufficiently Active   Days of Exercise per Week: 5 days   Minutes of Exercise per Session: 30 min  Stress: No Stress Concern Present   Feeling of Stress : Not at all  Social Connections: Moderately Integrated   Frequency of Communication with Friends and Family: More than three times a week   Frequency of Social Gatherings with Friends and Family: Once a week   Attends Religious Services: More than 4 times per year   Active Member of [employer] or Organizations: No   Attends Music therapist: More than 4 times per year   Marital Status: Widowed    Family History  Problem Relation Age of Onset   Arthritis Father    Heart disease Father    Hypertension Father    Stroke Father    Heart attack Father    Arthritis Mother    Stroke Mother    Stroke Brother    Diabetes Brother    Hypertension Brother    Colon cancer Sister        in her 76's   Hyperlipidemia Sister    Hypertension Sister    Hypertension Sister    Diabetes Sister    Esophageal cancer Neg Hx    Rectal cancer Neg Hx    Stomach cancer Neg Hx     Review of Systems  Constitutional:  Negative for chills and fever.       Less energy, has to rest more  HENT:  Positive for trouble swallowing.   Respiratory:  Positive for cough and choking. Negative for chest tightness, shortness of breath and wheezing.   Cardiovascular:  Negative for chest pain,  palpitations and leg swelling.  Gastrointestinal:  Negative for abdominal pain, blood in stool, constipation, diarrhea and nausea.       No gerd  Neurological:  Negative for light-headedness and headaches.      Objective:   Vitals:   09/12/21 0826  BP: 130/62  Pulse: 85  Temp: 98.1 F (36.7 C)  SpO2: 99%   BP Readings from Last 3 Encounters:  09/12/21 130/62  02/08/21 126/78  09/08/20 116/64  Wt Readings from Last 3 Encounters:  09/12/21 103 lb (46.7 kg)  02/08/21 106 lb 9.6 oz (48.4 kg)  09/08/20 104 lb (47.2 kg)   Body mass index is 19.15 kg/m.   Physical Exam    Constitutional: Appears well-developed and well-nourished. No distress.  HENT:  Head: Normocephalic and atraumatic.  Neck: Neck supple. No tracheal deviation present. No thyromegaly present.  No cervical lymphadenopathy Cardiovascular: Normal rate, regular rhythm and normal heart sounds.   No murmur heard. No carotid bruit .  No edema Pulmonary/Chest: Effort normal and breath sounds normal. No respiratory distress. No has no wheezes. No rales.  Abdomen; soft, NT, ND Skin: Skin is warm and dry. Not diaphoretic.  Psychiatric: Normal mood and affect. Behavior is normal.      Assessment & Plan:    I spent 20 minutes dedicated to the care of this patient on the date of this encounter including review of recent labs, imaging and procedures, speciality notes, obtaining history, communicating with the patient, ordering medications, tests, and documenting clinical information in the EHR    See Problem List for Assessment and Plan of chronic medical problems.

## 2021-09-11 NOTE — Patient Instructions (Addendum)
   Flu and pneumonia vaccines given today.    Blood work was ordered.      Medications changes include :   none      Please followup in 1 year

## 2021-09-12 ENCOUNTER — Encounter: Payer: Self-pay | Admitting: Internal Medicine

## 2021-09-12 ENCOUNTER — Other Ambulatory Visit: Payer: Self-pay

## 2021-09-12 ENCOUNTER — Ambulatory Visit (INDEPENDENT_AMBULATORY_CARE_PROVIDER_SITE_OTHER): Payer: Medicare Other | Admitting: Internal Medicine

## 2021-09-12 VITALS — BP 130/62 | HR 85 | Temp 98.1°F | Ht 61.5 in | Wt 103.0 lb

## 2021-09-12 DIAGNOSIS — E7849 Other hyperlipidemia: Secondary | ICD-10-CM | POA: Diagnosis not present

## 2021-09-12 DIAGNOSIS — R1314 Dysphagia, pharyngoesophageal phase: Secondary | ICD-10-CM | POA: Diagnosis not present

## 2021-09-12 DIAGNOSIS — R739 Hyperglycemia, unspecified: Secondary | ICD-10-CM | POA: Diagnosis not present

## 2021-09-12 DIAGNOSIS — Z23 Encounter for immunization: Secondary | ICD-10-CM

## 2021-09-12 DIAGNOSIS — I10 Essential (primary) hypertension: Secondary | ICD-10-CM | POA: Diagnosis not present

## 2021-09-12 DIAGNOSIS — M810 Age-related osteoporosis without current pathological fracture: Secondary | ICD-10-CM

## 2021-09-12 DIAGNOSIS — N1831 Chronic kidney disease, stage 3a: Secondary | ICD-10-CM

## 2021-09-12 DIAGNOSIS — I493 Ventricular premature depolarization: Secondary | ICD-10-CM

## 2021-09-12 LAB — CBC WITH DIFFERENTIAL/PLATELET
Basophils Absolute: 0 10*3/uL (ref 0.0–0.1)
Basophils Relative: 0.7 % (ref 0.0–3.0)
Eosinophils Absolute: 0.1 10*3/uL (ref 0.0–0.7)
Eosinophils Relative: 1 % (ref 0.0–5.0)
HCT: 39.6 % (ref 36.0–46.0)
Hemoglobin: 13.1 g/dL (ref 12.0–15.0)
Lymphocytes Relative: 37.3 % (ref 12.0–46.0)
Lymphs Abs: 2.2 10*3/uL (ref 0.7–4.0)
MCHC: 33.1 g/dL (ref 30.0–36.0)
MCV: 94.3 fl (ref 78.0–100.0)
Monocytes Absolute: 0.5 10*3/uL (ref 0.1–1.0)
Monocytes Relative: 9.4 % (ref 3.0–12.0)
Neutro Abs: 3 10*3/uL (ref 1.4–7.7)
Neutrophils Relative %: 51.6 % (ref 43.0–77.0)
Platelets: 246 10*3/uL (ref 150.0–400.0)
RBC: 4.2 Mil/uL (ref 3.87–5.11)
RDW: 13.2 % (ref 11.5–15.5)
WBC: 5.8 10*3/uL (ref 4.0–10.5)

## 2021-09-12 LAB — COMPREHENSIVE METABOLIC PANEL
ALT: 9 U/L (ref 0–35)
AST: 17 U/L (ref 0–37)
Albumin: 4 g/dL (ref 3.5–5.2)
Alkaline Phosphatase: 50 U/L (ref 39–117)
BUN: 27 mg/dL — ABNORMAL HIGH (ref 6–23)
CO2: 33 mEq/L — ABNORMAL HIGH (ref 19–32)
Calcium: 9.2 mg/dL (ref 8.4–10.5)
Chloride: 102 mEq/L (ref 96–112)
Creatinine, Ser: 1.08 mg/dL (ref 0.40–1.20)
GFR: 45.42 mL/min — ABNORMAL LOW (ref >60.00)
Glucose, Bld: 98 mg/dL (ref 70–99)
Potassium: 4.4 mEq/L (ref 3.5–5.1)
Sodium: 140 mEq/L (ref 135–145)
Total Bilirubin: 0.6 mg/dL (ref 0.2–1.2)
Total Protein: 7 g/dL (ref 6.0–8.3)

## 2021-09-12 LAB — LIPID PANEL
Cholesterol: 199 mg/dL (ref 0–200)
HDL: 68.2 mg/dL (ref >39.00)
LDL Cholesterol: 113 mg/dL — ABNORMAL HIGH (ref 0–99)
NonHDL: 130.33
Total CHOL/HDL Ratio: 3
Triglycerides: 85 mg/dL (ref 0.0–149.0)
VLDL: 17 mg/dL (ref 0.0–40.0)

## 2021-09-12 LAB — VITAMIN D 25 HYDROXY (VIT D DEFICIENCY, FRACTURES): VITD: 66.89 ng/mL (ref 30.00–100.00)

## 2021-09-12 LAB — TSH: TSH: 1.4 u[IU]/mL (ref 0.35–5.50)

## 2021-09-12 LAB — HEMOGLOBIN A1C: Hgb A1c MFr Bld: 5.7 % (ref 4.6–6.5)

## 2021-09-12 NOTE — Assessment & Plan Note (Signed)
Chronic She is active She defers having a bone density because she knows she does not want to take any treatment vitamins Will check vitamin D level

## 2021-09-12 NOTE — Assessment & Plan Note (Signed)
Chronic BP well controlled Continue zebeta 2.5 mg daily cmp

## 2021-09-12 NOTE — Addendum Note (Signed)
Addended by: Marcina Millard on: 09/12/2021 03:26 PM   Modules accepted: Orders

## 2021-09-12 NOTE — Assessment & Plan Note (Addendum)
Chronic Family history of diabetes Check a1c Low sugar / carb diet Stressed regular exercise

## 2021-09-12 NOTE — Assessment & Plan Note (Signed)
Chronic ?Stable ?CMP ?

## 2021-09-12 NOTE — Assessment & Plan Note (Signed)
Chronic Check lipid panel, cmp, tsh Diet controlled Regular exercise and healthy diet encouraged

## 2021-09-12 NOTE — Assessment & Plan Note (Signed)
Chronic Has seen GI and currently follows with ENT-Dr. Wilburn Cornelia Has had her proximal esophagus dilated several times and when she saw Dr. Wilburn Cornelia earlier this year she did have repeat imaging and had severe stricture in the cervical esophagus-she deferred dilation at that time She notes that she has been coughing more recently-discussed at length that this is related to her esophageal dysmotility and it is putting her at risk for aspiration pneumonia Advised to follow-up with Dr. Wilburn Cornelia

## 2021-09-12 NOTE — Assessment & Plan Note (Signed)
Chronic Follows with Dr Lovena Le Taking bisoprolol and flecainide Tsh, cmp

## 2021-09-28 ENCOUNTER — Other Ambulatory Visit: Payer: Self-pay | Admitting: Otolaryngology

## 2021-10-03 DIAGNOSIS — Z23 Encounter for immunization: Secondary | ICD-10-CM | POA: Diagnosis not present

## 2021-10-07 DIAGNOSIS — Z85828 Personal history of other malignant neoplasm of skin: Secondary | ICD-10-CM | POA: Diagnosis not present

## 2021-10-07 DIAGNOSIS — L821 Other seborrheic keratosis: Secondary | ICD-10-CM | POA: Diagnosis not present

## 2021-10-07 DIAGNOSIS — D2239 Melanocytic nevi of other parts of face: Secondary | ICD-10-CM | POA: Diagnosis not present

## 2021-10-07 DIAGNOSIS — D2261 Melanocytic nevi of right upper limb, including shoulder: Secondary | ICD-10-CM | POA: Diagnosis not present

## 2021-10-07 DIAGNOSIS — L820 Inflamed seborrheic keratosis: Secondary | ICD-10-CM | POA: Diagnosis not present

## 2021-10-20 ENCOUNTER — Other Ambulatory Visit: Payer: Self-pay

## 2021-10-20 ENCOUNTER — Encounter (HOSPITAL_COMMUNITY): Payer: Self-pay | Admitting: Otolaryngology

## 2021-10-20 NOTE — Progress Notes (Signed)
Spoke with Michelle Vazquez for pre-op call. Michelle Vazquez has hx of A-fib, PST and PVC's. Michelle Vazquez's cardiologist is Dr. Lovena Le, last office visit was 02/08/21. Michelle Vazquez denies any recent chest pain or shortness of breath.   Michelle Vazquez's surgery is scheduled as ambulatory so no Covid test is required prior to surgery.

## 2021-10-21 ENCOUNTER — Ambulatory Visit (HOSPITAL_COMMUNITY): Payer: Medicare Other | Admitting: Anesthesiology

## 2021-10-21 ENCOUNTER — Encounter (HOSPITAL_COMMUNITY)
Admission: RE | Disposition: A | Discharge status: Home / Self Care | Payer: Self-pay | Source: Home / Self Care | Attending: Otolaryngology

## 2021-10-21 ENCOUNTER — Ambulatory Visit (HOSPITAL_COMMUNITY)
Admission: RE | Admit: 2021-10-21 | Discharge: 2021-10-21 | Disposition: A | Discharge status: Home / Self Care | Payer: Medicare Other | Attending: Otolaryngology | Admitting: Otolaryngology

## 2021-10-21 ENCOUNTER — Encounter (HOSPITAL_COMMUNITY): Payer: Self-pay | Admitting: Otolaryngology

## 2021-10-21 DIAGNOSIS — R1314 Dysphagia, pharyngoesophageal phase: Secondary | ICD-10-CM | POA: Insufficient documentation

## 2021-10-21 DIAGNOSIS — I472 Ventricular tachycardia, unspecified: Secondary | ICD-10-CM | POA: Diagnosis not present

## 2021-10-21 DIAGNOSIS — I341 Nonrheumatic mitral (valve) prolapse: Secondary | ICD-10-CM | POA: Diagnosis not present

## 2021-10-21 DIAGNOSIS — I4891 Unspecified atrial fibrillation: Secondary | ICD-10-CM | POA: Insufficient documentation

## 2021-10-21 DIAGNOSIS — I10 Essential (primary) hypertension: Secondary | ICD-10-CM | POA: Diagnosis not present

## 2021-10-21 DIAGNOSIS — E785 Hyperlipidemia, unspecified: Secondary | ICD-10-CM | POA: Diagnosis not present

## 2021-10-21 DIAGNOSIS — K222 Esophageal obstruction: Secondary | ICD-10-CM | POA: Diagnosis not present

## 2021-10-21 HISTORY — DX: Pneumonia, unspecified organism: J18.9

## 2021-10-21 HISTORY — DX: Cardiac arrhythmia, unspecified: I49.9

## 2021-10-21 HISTORY — PX: LARYNGOSCOPY AND ESOPHAGOSCOPY: SHX5660

## 2021-10-21 LAB — CBC
HCT: 39.1 % (ref 36.0–46.0)
Hemoglobin: 12.9 g/dL (ref 12.0–15.0)
MCH: 32.1 pg (ref 26.0–34.0)
MCHC: 33 g/dL (ref 30.0–36.0)
MCV: 97.3 fL (ref 80.0–100.0)
Platelets: 230 10*3/uL (ref 150–400)
RBC: 4.02 MIL/uL (ref 3.87–5.11)
RDW: 12.5 % (ref 11.5–15.5)
WBC: 5.5 10*3/uL (ref 4.0–10.5)
nRBC: 0 % (ref 0.0–0.2)

## 2021-10-21 LAB — BASIC METABOLIC PANEL
Anion gap: 8 (ref 5–15)
BUN: 23 mg/dL (ref 8–23)
CO2: 26 mmol/L (ref 22–32)
Calcium: 8.9 mg/dL (ref 8.9–10.3)
Chloride: 104 mmol/L (ref 98–111)
Creatinine, Ser: 1.03 mg/dL — ABNORMAL HIGH (ref 0.44–1.00)
GFR, Estimated: 52 mL/min — ABNORMAL LOW (ref >60)
Glucose, Bld: 94 mg/dL (ref 70–99)
Potassium: 4 mmol/L (ref 3.5–5.1)
Sodium: 138 mmol/L (ref 135–145)

## 2021-10-21 SURGERY — LARYNGOSCOPY, WITH ESOPHAGOSCOPY
Anesthesia: General | Site: Throat

## 2021-10-21 MED ORDER — ORAL CARE MOUTH RINSE: 15.0000 mL | Freq: Once | OROMUCOSAL | Status: AC

## 2021-10-21 MED ORDER — ROCURONIUM BROMIDE 10 MG/ML (PF) SYRINGE
PREFILLED_SYRINGE | INTRAVENOUS | Status: DC | PRN
  Administered 2021-10-21: 10 mg via INTRAVENOUS
  Administered 2021-10-21: 20 mg via INTRAVENOUS

## 2021-10-21 MED ORDER — PHENYLEPHRINE HCL (PRESSORS) 10 MG/ML IV SOLN
INTRAVENOUS | Status: DC | PRN
  Administered 2021-10-21: 150 ug via INTRAVENOUS
  Administered 2021-10-21: 50 ug via INTRAVENOUS
  Administered 2021-10-21: 200 ug via INTRAVENOUS

## 2021-10-21 MED ORDER — CEFAZOLIN SODIUM 1 G IJ SOLR
INTRAMUSCULAR | Status: AC
  Filled 2021-10-21: qty 30

## 2021-10-21 MED ORDER — FENTANYL CITRATE (PF) 250 MCG/5ML IJ SOLN
INTRAMUSCULAR | Status: AC
  Filled 2021-10-21: qty 5

## 2021-10-21 MED ORDER — ROCURONIUM BROMIDE 10 MG/ML (PF) SYRINGE
PREFILLED_SYRINGE | INTRAVENOUS | Status: AC
  Filled 2021-10-21: qty 10

## 2021-10-21 MED ORDER — EPINEPHRINE HCL (NASAL) 0.1 % NA SOLN
NASAL | Status: AC
  Filled 2021-10-21: qty 30

## 2021-10-21 MED ORDER — CHLORHEXIDINE GLUCONATE 0.12 % MT SOLN
OROMUCOSAL | Status: AC
  Administered 2021-10-21: 15 mL via OROMUCOSAL
  Filled 2021-10-21: qty 15

## 2021-10-21 MED ORDER — FENTANYL CITRATE (PF) 100 MCG/2ML IJ SOLN
INTRAMUSCULAR | Status: DC | PRN
  Administered 2021-10-21: 50 ug via INTRAVENOUS
  Administered 2021-10-21: 75 ug via INTRAVENOUS

## 2021-10-21 MED ORDER — STERILE WATER FOR IRRIGATION IR SOLN
Status: DC | PRN
  Administered 2021-10-21: 1000 mL

## 2021-10-21 MED ORDER — LACTATED RINGERS IV SOLN: INTRAVENOUS | Status: DC | PRN

## 2021-10-21 MED ORDER — ONDANSETRON HCL 4 MG/2ML IJ SOLN
INTRAMUSCULAR | Status: DC | PRN
  Administered 2021-10-21: 4 mg via INTRAVENOUS

## 2021-10-21 MED ORDER — CEFAZOLIN SODIUM-DEXTROSE 2-3 GM-%(50ML) IV SOLR
INTRAVENOUS | Status: DC | PRN
  Administered 2021-10-21: 2 g via INTRAVENOUS

## 2021-10-21 MED ORDER — CHLORHEXIDINE GLUCONATE 0.12 % MT SOLN: 15.0000 mL | Freq: Once | OROMUCOSAL | Status: AC

## 2021-10-21 MED ORDER — PHENYLEPHRINE 40 MCG/ML (10ML) SYRINGE FOR IV PUSH (FOR BLOOD PRESSURE SUPPORT)
PREFILLED_SYRINGE | INTRAVENOUS | Status: AC
  Filled 2021-10-21: qty 10

## 2021-10-21 MED ORDER — PROPOFOL 10 MG/ML IV BOLUS
INTRAVENOUS | Status: DC | PRN
  Administered 2021-10-21: 100 mg via INTRAVENOUS

## 2021-10-21 MED ORDER — SUGAMMADEX SODIUM 200 MG/2ML IV SOLN
INTRAVENOUS | Status: DC | PRN
  Administered 2021-10-21: 200 mg via INTRAVENOUS

## 2021-10-21 MED ORDER — LACTATED RINGERS IV SOLN: INTRAVENOUS | Status: DC

## 2021-10-21 MED ORDER — FENTANYL CITRATE (PF) 100 MCG/2ML IJ SOLN: 25.0000 ug | INTRAMUSCULAR | Status: DC | PRN

## 2021-10-21 MED ORDER — ONDANSETRON HCL 4 MG/2ML IJ SOLN
INTRAMUSCULAR | Status: AC
  Filled 2021-10-21: qty 2

## 2021-10-21 MED ORDER — ONDANSETRON HCL 4 MG/2ML IJ SOLN: 4.0000 mg | Freq: Once | INTRAMUSCULAR | Status: DC | PRN

## 2021-10-21 SURGICAL SUPPLY — 18 items
CANISTER SUCT 3000ML PPV (MISCELLANEOUS) ×2 IMPLANT
COVER BACK TABLE 60X90IN (DRAPES) ×2 IMPLANT
COVER MAYO STAND STRL (DRAPES) ×2 IMPLANT
DRAPE HALF SHEET 40X57 (DRAPES) ×2 IMPLANT
GAUZE 4X4 16PLY ~~LOC~~+RFID DBL (SPONGE) ×2 IMPLANT
GLOVE SURG ENC TEXT LTX SZ7 (GLOVE) ×2 IMPLANT
GUARD TEETH (MISCELLANEOUS) ×1 IMPLANT
KIT BASIN OR (CUSTOM PROCEDURE TRAY) ×2 IMPLANT
KIT TURNOVER KIT B (KITS) ×2 IMPLANT
NS IRRIG 1000ML POUR BTL (IV SOLUTION) ×2 IMPLANT
PAD ARMBOARD 7.5X6 YLW CONV (MISCELLANEOUS) ×4 IMPLANT
PATTIES SURGICAL .5 X3 (DISPOSABLE) ×1 IMPLANT
SOL ANTI FOG 6CC (MISCELLANEOUS) IMPLANT
SOLUTION ANTI FOG 6CC (MISCELLANEOUS) ×1
SURGILUBE 2OZ TUBE FLIPTOP (MISCELLANEOUS) ×2 IMPLANT
TOWEL GREEN STERILE FF (TOWEL DISPOSABLE) ×2 IMPLANT
TUBE CONNECTING 12X1/4 (SUCTIONS) ×2 IMPLANT
WATER STERILE IRR 1000ML POUR (IV SOLUTION) ×2 IMPLANT

## 2021-10-21 NOTE — Anesthesia Procedure Notes (Signed)
Procedure Name: Intubation Date/Time: 10/21/2021 9:11 AM Performed by: Cathren Harsh, CRNA Pre-anesthesia Checklist: Patient identified, Emergency Drugs available, Suction available and Patient being monitored Patient Re-evaluated:Patient Re-evaluated prior to induction Oxygen Delivery Method: Circle System Utilized Preoxygenation: Pre-oxygenation with 100% oxygen Induction Type: IV induction Ventilation: Mask ventilation without difficulty Laryngoscope Size: Mac and 3 Grade View: Grade I Tube type: Oral Tube size: 6.5 mm Number of attempts: 1 Airway Equipment and Method: Stylet and Oral airway Placement Confirmation: ETT inserted through vocal cords under direct vision, positive ETCO2 and breath sounds checked- equal and bilateral Secured at: 20 cm Tube secured with: Tape Dental Injury: Teeth and Oropharynx as per pre-operative assessment

## 2021-10-21 NOTE — Anesthesia Postprocedure Evaluation (Signed)
Anesthesia Post Note  Patient: Michelle Vazquez  Procedure(s) Performed: DIRECT LARYNGOSCOPY AND ESOPHAGOSCOPY WITH DILATION OF ESOPHAGEAL NARROWING (Throat)     Patient location during evaluation: PACU Anesthesia Type: General Level of consciousness: awake and alert and oriented Pain management: pain level controlled Vital Signs Assessment: post-procedure vital signs reviewed and stable Respiratory status: spontaneous breathing, nonlabored ventilation and respiratory function stable Cardiovascular status: blood pressure returned to baseline and stable Postop Assessment: no apparent nausea or vomiting Anesthetic complications: no   No notable events documented.  Last Vitals:  Vitals:   10/21/21 0945 10/21/21 1000  BP: 127/65 127/69  Pulse: 69 70  Resp: (!) 22 14  Temp: 36.6 C   SpO2: 99% 97%    Last Pain:  Vitals:   10/21/21 0945  TempSrc:   PainSc: 0-No pain                 Jahan Friedlander A.

## 2021-10-21 NOTE — Op Note (Signed)
Operative Note: DIRECT LARYNGOSCOPY/ESOPHAGOSCOPY/ESOPHAGEAL DILATION  Patient: Michelle Vazquez  Medical record number: [id number]  Date:10/21/2021  Pre-operative Indications: Chronic pharyngoesophageal dysphagia  Postoperative Indications: Same  Surgical Procedure: 1.  Direct laryngoscopy    2.  Cervical esophagoscopy    3.  Esophageal dilation  Anesthesia: GET  Surgeon: Delsa Bern, M.D.  Assist: None  Complications: None  EBL: None   Brief History: The patient is a 87 y.o. female with a history of chronic progressive pharyngoesophageal dysphagia with difficulty swallowing solids, liquids and pills.  She has a history of coughing with concerns regarding aspiration.  The patient undergone multiple previous esophageal procedures performed by gastroenterology.  She was referred to our office in 2020 because of concerns regarding upper esophageal stricture.  She underwent dilation in November 2020 with improvement in swallowing function. Given the patient's history and findings I recommended direct laryngoscopy, cervical esophagoscopy and esophageal dilation under general anesthesia, risks and benefits were discussed in detail with the patient and her family. They understand and agree with our plan for surgery which is scheduled at [facility] in [place] on an elective basis.  Surgical Procedure: The patient is brought to the operating room on 10/21/2021 and placed in supine position on the operating table. General endotracheal anesthesia was established without difficulty. When the patient was adequately anesthetized, surgical timeout was performed and correct identification of the patient and the surgical procedure. The patient was positioned and prepped and draped in sterile fashion.  A laryngoscope was used to examine the patient's oral cavity, oropharynx and larynx.  Findings include normal-appearing supraglottis, larynx and vocal cords without evidence of mass or tumor.  Hypopharynx  was normal with some pooled secretions, no evidence of diverticulum or obstruction.   The cervical esophagoscope was then inserted without difficulty through the esophageal introitus and advanced to its full length.  The esophagoscope was then withdrawn slowly to allow visualization of the esophageal mucosa.  Findings include the patient had a stricture at the esophageal introitus which was smooth and soft, no evidence of ulcer, mass or tumor, no diverticulum.  The patient's upper esophagus was then serially dilated using Maloney dilators, progressing from 20 [place] to 46 French diameter.  The dilators were passed without difficulty to their full length.  There is no bleeding and no evidence of resistance, trauma or perforation.  An orogastric tube was passed and stomach contents were aspirated. Patient was awakened from anesthetic and transferred from the operating room to the recovery room in stable condition. There were no complications and blood loss was minimal.   Delsa Bern, M.D. [facility] ENT 10/21/2021

## 2021-10-21 NOTE — Anesthesia Preprocedure Evaluation (Signed)
Anesthesia Evaluation  Patient identified by MRN, date of birth, ID band Patient awake    Reviewed: Allergy & Precautions, NPO status , Patient's Chart, lab work & pertinent test results, reviewed documented beta blocker date and time   History of Anesthesia Complications (+) history of anesthetic complications  Airway Mallampati: II  TM Distance: >3 FB Neck ROM: Full    Dental  (+) Partial Upper, Dental Advisory Given   Pulmonary pneumonia, resolved,    Pulmonary exam normal breath sounds clear to auscultation       Cardiovascular hypertension, Pt. on medications and Pt. on home beta blockers Normal cardiovascular exam+ dysrhythmias Atrial Fibrillation and Supra Ventricular Tachycardia  Rhythm:Regular Rate:Normal     Neuro/Psych negative neurological ROS  negative psych ROS   GI/Hepatic Neg liver ROS, Dysphagia Hx/o Schatzki ring with dilation in the past   Endo/Other  negative endocrine ROS  Renal/GU Renal InsufficiencyRenal disease  negative genitourinary   Musculoskeletal  (+) Arthritis , Osteoarthritis,  Osteoporosis   Abdominal   Peds  Hematology negative hematology ROS (+)   Anesthesia Other Findings   Reproductive/Obstetrics                             Anesthesia Physical Anesthesia Plan  ASA: 2  Anesthesia Plan: General   Post-op Pain Management:    Induction: Intravenous  PONV Risk Score and Plan: 4 or greater and Treatment may vary due to age or medical condition and Ondansetron  Airway Management Planned: Oral ETT  Additional Equipment:   Intra-op Plan:   Post-operative Plan: Extubation in OR  Informed Consent: I have reviewed the patients History and Physical, chart, labs and discussed the procedure including the risks, benefits and alternatives for the proposed anesthesia with the patient or authorized representative who has indicated his/her understanding and  acceptance.     Dental advisory given  Plan Discussed with: CRNA and Anesthesiologist  Anesthesia Plan Comments:         Anesthesia Quick Evaluation

## 2021-10-21 NOTE — H&P (Signed)
Michelle Vazquez is an 87 y.o. female.   Chief Complaint: Progressive dysphagia HPI: Patient with a history of pharyngeal esophageal narrowing and progressive dysphagia.  Patient underwent previous esophageal dilation with improvement in swallowing function.  Past Medical History:  Diagnosis Date   Arthritis    Atrial fibrillation ([place])    Bowel obstruction ([place]) 2000   Cataract    bilateral removed   Complication of anesthesia    hard to wake after hysterectomy, states heart stopped during a Esophagoscopy, but states she has had several surgeries and procedures with anesthesia and has never had that again   Dysrhythmia    HLD (hyperlipidemia)    Hypercontractile esophagus    Mitral valve prolapse    Osteoporosis    declines rx and declines DEXA f/u   Parathyroid adenoma    post parathyroid surgery in October of 2008   Paroxysmal supraventricular tachycardia ([place])    Pneumonia    as a child   Premature ventricular contractions (PVCs) (VPCs)    Schatzki's ring 08/2013   Dr. Hilarie Fredrickson, s/p dilation 08/2013, ?benefit   Squamous cell carcinoma in situ of skin    face   Urinary incontinence     Past Surgical History:  Procedure Laterality Date   ABDOMINAL HYSTERECTOMY  1989   BREAST BIOPSY  1979   left   CATARACT EXTRACTION     bilateral   ESOPHAGEAL MANOMETRY N/A 11/17/2015   Procedure: ESOPHAGEAL MANOMETRY (EM);  Surgeon: Jerene Bears, MD;  [facility];  Service: Gastroenterology;  Laterality: N/A;   ESOPHAGOSCOPY  09/17/2019   Procedure: Cervical Esophagoscopy;  Surgeon: Jerrell Belfast, MD;  Location: [place];  Service: ENT;;   EYE SURGERY     LAPAROSCOPIC SMALL BOWEL RESECTION  2000   MICROLARYNGOSCOPY WITH DILATION N/A 09/17/2019   Procedure: MICROLARYNGOSCOPY WITH DILATION;  Surgeon: Jerrell Belfast, MD;  Location: [place];  Service: ENT;  Laterality: N/A;   parathyroid surgery  2008?   left   SHOULDER SURGERY  02/1999   right   SKIN LESION EXCISION  08/1999    left side of face    Family History  Problem Relation Age of Onset   Arthritis Father    Heart disease Father    Hypertension Father    Stroke Father    Heart attack Father    Arthritis Mother    Stroke Mother    Stroke Brother    Diabetes Brother    Hypertension Brother    Colon cancer Sister        in her 79's   Hyperlipidemia Sister    Hypertension Sister    Hypertension Sister    Diabetes Sister    Esophageal cancer Neg Hx    Rectal cancer Neg Hx    Stomach cancer Neg Hx    Social History:  reports that she has never smoked. She has never used smokeless tobacco. She reports that she does not drink alcohol and does not use drugs.  Allergies:  Allergies  Allergen Reactions   Diltiazem Hcl Other (See Comments)    Rapid heartbeat and elevated blood pressure   Inderal [Propranolol Hcl]     Pt does not remember reaction   Lopressor [Metoprolol Tartrate]     Per the pt' "I felt like I was going to pass out"   Penicillins Diarrhea    Medications Prior to Admission  Medication Sig Dispense Refill   bisoprolol (ZEBETA) 5 MG tablet Take 0.5 tablets (2.5 mg total) by  mouth daily. 45 tablet 3   calcium gluconate 500 MG tablet Take 500 mg by mouth daily. chew     flecainide (TAMBOCOR) 50 MG tablet Take 1 tablet by mouth twice daily 180 tablet 3   Multiple Vitamin (MULTIVITAMIN WITH MINERALS) TABS tablet Take 1 tablet by mouth daily. gummies     Multiple Vitamins-Minerals (VISION-VITE PRESERVE PO) Take 1 tablet by mouth 2 (two) times daily.      Results for orders placed or performed during the hospital encounter of 10/21/21 (from the past 48 hour(s))  CBC per protocol     Status: None   Collection Time: 10/21/21  7:11 AM  Result Value Ref Range   WBC 5.5 4.0 - 10.5 K/uL   RBC 4.02 3.87 - 5.11 MIL/uL   Hemoglobin 12.9 12.0 - 15.0 g/dL   HCT 39.1 36.0 - 46.0 %   MCV 97.3 80.0 - 100.0 fL   MCH 32.1 26.0 - 34.0 pg   MCHC 33.0 30.0 - 36.0 g/dL   RDW 12.5 11.5 - 15.5 %    Platelets 230 150 - 400 K/uL   nRBC 0.0 0.0 - 0.2 %    Comment: Performed at [facility], [place] [address], [place], [place] [address]  Basic metabolic panel per protocol     Status: Abnormal   Collection Time: 10/21/21  7:11 AM  Result Value Ref Range   Sodium 138 135 - 145 mmol/L   Potassium 4.0 3.5 - 5.1 mmol/L   Chloride 104 98 - 111 mmol/L   CO2 26 22 - 32 mmol/L   Glucose, Bld 94 70 - 99 mg/dL    Comment: Glucose reference range applies only to samples taken after fasting for at least 8 hours.   BUN 23 8 - 23 mg/dL   Creatinine, Ser 1.03 (H) 0.44 - 1.00 mg/dL   Calcium 8.9 8.9 - 10.3 mg/dL   GFR, Estimated 52 (L) >60 mL/min    Comment: (NOTE) Calculated using the CKD-EPI Creatinine Equation (2021)    Anion gap 8 5 - 15    Comment: Performed at [facility] [address], [place], [place] [address]   No results found.  Review of Systems  HENT:  Positive for trouble swallowing.   Respiratory: Negative.    Cardiovascular: Negative.    Blood pressure (!) 153/69, pulse 71, temperature 98 F (36.7 C), temperature source Oral, resp. rate 17, height 5' 1.5" (1.562 m), weight 47.6 kg, SpO2 98 %. Physical Exam Constitutional:      Appearance: Normal appearance.  Cardiovascular:     Rate and Rhythm: Normal rate.  Pulmonary:     Effort: Pulmonary effort is normal.  Musculoskeletal:     Cervical back: Normal range of motion.  Neurological:     Mental Status: She is alert.     Assessment/Plan Patient admitted for laryngoscopy, cervical esophagoscopy and dilation of pharyngo-esophageal stricture.  Jerrell Belfast, MD 10/21/2021, 8:33 AM

## 2021-10-21 NOTE — Transfer of Care (Signed)
Immediate Anesthesia Transfer of Care Note  Patient: Michelle Vazquez  Procedure(s) Performed: DIRECT LARYNGOSCOPY AND ESOPHAGOSCOPY WITH DILATION OF ESOPHAGEAL NARROWING (Throat)  Patient Location: PACU  Anesthesia Type:General  Level of Consciousness: drowsy, patient cooperative and responds to stimulation  Airway & Oxygen Therapy: Patient Spontanous Breathing and Patient connected to face mask oxygen  Post-op Assessment: Report given to RN and Post -op Vital signs reviewed and stable  Post vital signs: Reviewed and stable  Last Vitals:  Vitals Value Taken Time  BP    Temp    Pulse 69 10/21/21 0944  Resp 22 10/21/21 0944  SpO2 99 % 10/21/21 0944  Vitals shown include unvalidated device data.  Last Pain:  Vitals:   10/21/21 0748  TempSrc:   PainSc: 0-No pain         Complications: No notable events documented.

## 2021-10-22 ENCOUNTER — Encounter (HOSPITAL_COMMUNITY): Payer: Self-pay | Admitting: Otolaryngology

## 2021-11-15 ENCOUNTER — Telehealth: Payer: Self-pay | Admitting: *Deleted

## 2021-11-15 NOTE — Telephone Encounter (Signed)
Pt walked into office today reporting Dr Claiborne Rigg told her to come in to have her HR and BP checked and to be scheduled for an appt.  Pt reports she has noticed an irregular HB off/on since the beginning of the year.  She denies any other s/s - no dizziness, no weakness, no SOB, no fatigue etc.  HR 93 BP 148/81 in left arm regular cuff, while sitting.  Advised pt to continue current medications, avoid any stimulants and follow up on Thursday 11/17/21 at 8:25 am with Tommye Standard.  She will c/b if any changes/concerns prior to her appt.

## 2021-11-15 NOTE — Progress Notes (Addendum)
Cardiology Office Note Date:  11/17/2021  Patient ID:  Michelle Vazquez, Michelle Vazquez September 09, 1929, MRN [id number] PCP:  Binnie Rail, MD  Electrophysiologist: Dr. Lovena Le    Chief Complaint: palpitations  History of Present Illness: Michelle Vazquez is a 88 y.o. female with history of PVCs, HTN, MVP, HLD  Afib is listed as part of her history though not discussed by Dr. Lovena Le as a known diagnosis.  She comes in today to be seen for Dr. 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  Noted some DOE with clas II symptoms, planned to update her echo.  LVEF 60-65%, grade II CHF, RVEF ok, Mild mitral valve regurgitation. No  evidence of mitral stenosis. There is moderate holosystolic prolapse of  the middle scallop of the posterior leaflet of the mitral valve Started lasix 20mg  daily though pt declined with concerns of dehydration given her swallowing difficulties and poor oral hydration, she was advised to minimize sodium  She walked into the office 11/15/20 reporting c/o palpitations and being instructed to see [place] by her GI MD BP HR check ed by RN, noted stable and given an appt to be seen.  TODAY She reports for many years her heart beat has been perfect, though a week or so ago noted an unusual feeling in her heart and when checking her pulse noted intermittently for a couple hours she would feel 2 normal beats then a skipped beat. No CP, no SOB, no no dizziness, near syncope or syncope. No clear change of late, she had esophageal dilation though has had these before. She eats very litte, but always has.   AAD hx Flecainide started 2014 (PVCs)   Past Medical History:  Diagnosis Date   Arthritis    Atrial fibrillation ([place])    Bowel obstruction ([place]) 2000   Cataract    bilateral removed   Complication of anesthesia    hard to wake after hysterectomy, states heart stopped during a Esophagoscopy, but  states she has had several surgeries and procedures with anesthesia and has never had that again   Dysrhythmia    HLD (hyperlipidemia)    Hypercontractile esophagus    Mitral valve prolapse    Osteoporosis    declines rx and declines DEXA f/u   Parathyroid adenoma    post parathyroid surgery in October of 2008   Paroxysmal supraventricular tachycardia ([place])    Pneumonia    as a child   Premature ventricular contractions (PVCs) (VPCs)    Schatzki's ring 08/2013   Dr. Hilarie Fredrickson, s/p dilation 08/2013, ?benefit   Squamous cell carcinoma in situ of skin    face   Urinary incontinence     Past Surgical History:  Procedure Laterality Date   ABDOMINAL HYSTERECTOMY  1989   BREAST BIOPSY  1979   left   CATARACT EXTRACTION     bilateral   ESOPHAGEAL MANOMETRY N/A 11/17/2015   Procedure: ESOPHAGEAL MANOMETRY (EM);  Surgeon: Jerene Bears, MD;  [facility];  Service: Gastroenterology;  Laterality: N/A;   ESOPHAGOSCOPY  09/17/2019   Procedure: Cervical Esophagoscopy;  Surgeon: Jerrell Belfast, MD;  Location: [place];  Service: ENT;;   EYE SURGERY     LAPAROSCOPIC SMALL BOWEL RESECTION  2000   LARYNGOSCOPY AND ESOPHAGOSCOPY N/A 10/21/2021   Procedure: DIRECT LARYNGOSCOPY AND ESOPHAGOSCOPY WITH DILATION OF ESOPHAGEAL NARROWING;  Surgeon: Jerrell Belfast, MD;  Location: [place];  Service: ENT;  Laterality: N/A;   MICROLARYNGOSCOPY WITH DILATION N/A 09/17/2019   Procedure: MICROLARYNGOSCOPY WITH DILATION;  Surgeon: Jerrell Belfast, MD;  Location: [place];  Service: ENT;  Laterality: N/A;   parathyroid surgery  2008?   left   SHOULDER SURGERY  02/1999   right   SKIN LESION EXCISION  08/1999   left side of face    Current Outpatient Medications  Medication Sig Dispense Refill   bisoprolol (ZEBETA) 5 MG tablet Take 0.5 tablets (2.5 mg total) by mouth daily. 45 tablet 3   calcium gluconate 500 MG tablet Take 500 mg by mouth daily. chew     flecainide (TAMBOCOR) 50 MG tablet Take 1 tablet  by mouth twice daily 180 tablet 3   Multiple Vitamin (MULTIVITAMIN WITH MINERALS) TABS tablet Take 1 tablet by mouth daily. gummies     Multiple Vitamins-Minerals (VISION-VITE PRESERVE PO) Take 1 tablet by mouth 2 (two) times daily.     No current facility-administered medications for this visit.    Allergies:   Diltiazem hcl, Inderal [propranolol hcl], Lopressor [metoprolol tartrate], and Penicillins   Social History:  The patient  reports that she has never smoked. She has never used smokeless tobacco. She reports that she does not drink alcohol and does not use drugs.   Family History:  The patient's family history includes Arthritis in her father and mother; Colon cancer in her sister; Diabetes in her brother and sister; Heart attack in her father; Heart disease in her father; Hyperlipidemia in her sister; Hypertension in her brother, father, sister, and sister; Stroke in her brother, father, and mother.  ROS:  Please see the history of present illness.    All other systems are reviewed and otherwise negative.   PHYSICAL EXAM:  VS:  BP 134/82    Pulse 86    Ht 5' 1.5" (1.562 m)    Wt 104 lb (47.2 kg)    SpO2 96%    BMI 19.33 kg/m  BMI: Body mass index is 19.33 kg/m. Well nourished, well developed, in no acute distress HEENT: normocephalic, atraumatic Neck: no JVD, carotid bruits or masses Cardiac:  RRR; no extrasystoles noted with prolonged ausculation, no significant murmurs, no rubs, or gallops Lungs:  CTA b/l, no wheezing, rhonchi or rales Abd: soft, nontender MS: no deformity, age appropriate, perhaps advanced atrophy Ext: no edema Skin: warm and dry, no rash Neuro:  No gross deficits appreciated Psych: euthymic mood, full affect   EKG:  Done today and reviewed by myself shows   SR 86bpm, 1st degree Avblock  323ms, QRS 7ms, PVCs (2)  03/16/21: TTE IMPRESSIONS   1. Left ventricular ejection fraction, by estimation, is 60 to 65%. The  left ventricle has normal function.  The left ventricle has no regional  wall motion abnormalities. Left ventricular diastolic parameters are  consistent with Grade II diastolic  dysfunction (pseudonormalization).   2. Right ventricular systolic function is normal. The right ventricular  size is mildly enlarged.   3. Right atrial size was mildly dilated.   4. The mitral valve is myxomatous. Mild mitral valve regurgitation. No  evidence of mitral stenosis. There is moderate holosystolic prolapse of  the middle scallop of the posterior leaflet of the mitral valve.   5. The aortic valve is normal in structure. Aortic valve regurgitation is  mild. No aortic stenosis is present.   6. The inferior vena cava is normal in size with greater than 50%  respiratory variability, suggesting right atrial pressure of 3 mmHg.  Recent Labs: 09/12/2021: ALT 9; TSH 1.40 10/21/2021: Hemoglobin 12.9; Platelets 230 11/17/2021: BUN 21; Creatinine, Ser 1.06; Magnesium 2.1; Potassium 4.7; Sodium 140  09/12/2021: Cholesterol 199; HDL 68.20; LDL Cholesterol 113; Total CHOL/HDL Ratio 3; Triglycerides 85.0; VLDL 17.0   Estimated Creatinine Clearance: 26.3 mL/min (A) (by C-G formula based on SCr of 1.06 mg/dL (H)).   Wt Readings from Last 3 Encounters:  11/17/21 104 lb (47.2 kg)  10/21/21 105 lb (47.6 kg)  09/12/21 103 lb (46.7 kg)     Other studies reviewed: Additional studies/records reviewed today include: summarized above  ASSESSMENT AND PLAN:  PVCs longstanding Flecainide Long 1st degree AVblock, (last 241ms), appears to slowly be increasing over many years  2 PVCs on her EKG, none heard with prolonged auscultation With long 1st degree, do not want to increase her BB  Plan to have her wear a 72hour monitor to assess burden Check her labs/lytes I am weary of Flecainide though has tolerated well and been on for many years I will reach out to dr. Lovena Le who knows her well   Diastolic CHF No symptoms or exam findings of volume  OL   HTN Looks ok   ADDEND: 11/23/21 Reviewed with Dr. Lovena Le. Recommends stop bisoprolol and increase Flecainide to 75mg  BID EKG in a week Trough flecainide level in 2 weeks I will send to [place] to reach out to he patient with changes and appts. Tommye Standard, PA-C  Disposition: F/u with me in a month, sooner if needed   Current medicines are reviewed at length with the patient today.  The patient did not have any concerns regarding medicines.  Venetia Night, PA-C 11/17/2021 5:06 PM     [facility] [address] [place] [address] [phone] (office)  [phone] (fax)

## 2021-11-17 ENCOUNTER — Encounter: Payer: Self-pay | Admitting: Physician Assistant

## 2021-11-17 ENCOUNTER — Other Ambulatory Visit: Payer: Self-pay

## 2021-11-17 ENCOUNTER — Ambulatory Visit (INDEPENDENT_AMBULATORY_CARE_PROVIDER_SITE_OTHER): Payer: Medicare Other | Admitting: Physician Assistant

## 2021-11-17 VITALS — BP 134/82 | HR 86 | Ht 61.5 in | Wt 104.0 lb

## 2021-11-17 DIAGNOSIS — Z79899 Other long term (current) drug therapy: Secondary | ICD-10-CM

## 2021-11-17 DIAGNOSIS — Z5181 Encounter for therapeutic drug level monitoring: Secondary | ICD-10-CM

## 2021-11-17 DIAGNOSIS — I10 Essential (primary) hypertension: Secondary | ICD-10-CM | POA: Diagnosis not present

## 2021-11-17 DIAGNOSIS — R002 Palpitations: Secondary | ICD-10-CM | POA: Diagnosis not present

## 2021-11-17 DIAGNOSIS — I493 Ventricular premature depolarization: Secondary | ICD-10-CM | POA: Diagnosis not present

## 2021-11-17 LAB — BASIC METABOLIC PANEL
BUN/Creatinine Ratio: 20 (ref 12–28)
BUN: 21 mg/dL (ref 10–36)
CO2: 27 mmol/L (ref 20–29)
Calcium: 9.3 mg/dL (ref 8.7–10.3)
Chloride: 101 mmol/L (ref 96–106)
Creatinine, Ser: 1.06 mg/dL — ABNORMAL HIGH (ref 0.57–1.00)
Glucose: 91 mg/dL (ref 70–99)
Potassium: 4.7 mmol/L (ref 3.5–5.2)
Sodium: 140 mmol/L (ref 134–144)
eGFR: 50 mL/min/{1.73_m2} — ABNORMAL LOW (ref >59)

## 2021-11-17 LAB — MAGNESIUM: Magnesium: 2.1 mg/dL (ref 1.6–2.3)

## 2021-11-17 NOTE — Patient Instructions (Signed)
Medication Instructions:   Your physician recommends that you continue on your current medications as directed. Please refer to the Current Medication list given to you today.  *If you need a refill on your cardiac medications before your next appointment, please call your pharmacy*   Lab Work:  [facility] AND [place]    If you have labs (blood work) drawn today and your tests are completely normal, you will receive your results only by: [place] (if you have MyChart) OR A paper copy in the mail If you have any lab test that is abnormal or we need to change your treatment, we will call you to review the results.   Testing/Procedures: NONE ORDERED  TODAY    Follow-Up: At [facility], you and your health needs are our priority.  As part of our continuing mission to provide you with exceptional heart care, we have created designated Provider Care Teams.  These Care Teams include your primary Cardiologist (physician) and Advanced Practice Providers (APPs -  Physician Assistants and Nurse Practitioners) who all work together to provide you with the care you need, when you need it.  We recommend signing up for the patient portal called "MyChart".  Sign up information is provided on this After Visit Summary.  MyChart is used to connect with patients for Virtual Visits (Telemedicine).  Patients are able to view lab/test results, encounter notes, upcoming appointments, etc.  Non-urgent messages can be sent to your provider as well.   To learn more about what you can do with MyChart, go to [email or web address].    Your next appointment:   1 month(s)  The format for your next appointment:   In Person  Provider:   Tommye Standard, PA-C    Other Instructions

## 2021-11-18 ENCOUNTER — Ambulatory Visit (INDEPENDENT_AMBULATORY_CARE_PROVIDER_SITE_OTHER): Payer: Medicare Other

## 2021-11-18 ENCOUNTER — Other Ambulatory Visit: Payer: Self-pay | Admitting: *Deleted

## 2021-11-18 DIAGNOSIS — D3131 Benign neoplasm of right choroid: Secondary | ICD-10-CM | POA: Diagnosis not present

## 2021-11-18 DIAGNOSIS — I493 Ventricular premature depolarization: Secondary | ICD-10-CM

## 2021-11-18 DIAGNOSIS — H35372 Puckering of macula, left eye: Secondary | ICD-10-CM | POA: Diagnosis not present

## 2021-11-18 DIAGNOSIS — H353132 Nonexudative age-related macular degeneration, bilateral, intermediate dry stage: Secondary | ICD-10-CM | POA: Diagnosis not present

## 2021-11-18 NOTE — Progress Notes (Unsigned)
Enrolled patient for a 3 day Zio XT monitor to be mailed to patients home  Dr Taylor to read 

## 2021-11-23 ENCOUNTER — Other Ambulatory Visit: Payer: Self-pay | Admitting: *Deleted

## 2021-11-23 ENCOUNTER — Telehealth: Payer: Self-pay | Admitting: *Deleted

## 2021-11-23 DIAGNOSIS — I493 Ventricular premature depolarization: Secondary | ICD-10-CM

## 2021-11-23 DIAGNOSIS — Z79899 Other long term (current) drug therapy: Secondary | ICD-10-CM

## 2021-11-23 MED ORDER — FLECAINIDE ACETATE 50 MG PO TABS: 75.0000 mg | ORAL_TABLET | Freq: Two times a day (BID) | ORAL | 3 refills | Status: DC

## 2021-11-23 NOTE — Telephone Encounter (Signed)
Spoke with about recommendations  per Renee from discussion with Dr. Lovena Le. Patient wrote down all instructions as follows:  Stop taking Bisoprolol  Start taking Flecainide 75 mg twice a day ( no refills needed had enough on hand) Return 11-30-21 for EKG nurse visit  Return 12-07-21 for 2 weeks Flecainide trough ( Patient aware hold Flecainide until after lab)

## 2021-11-30 ENCOUNTER — Other Ambulatory Visit: Payer: Self-pay

## 2021-11-30 ENCOUNTER — Ambulatory Visit (INDEPENDENT_AMBULATORY_CARE_PROVIDER_SITE_OTHER): Payer: Medicare Other

## 2021-11-30 VITALS — HR 88 | Ht 61.0 in | Wt 104.2 lb

## 2021-11-30 DIAGNOSIS — Z5181 Encounter for therapeutic drug level monitoring: Secondary | ICD-10-CM | POA: Diagnosis not present

## 2021-11-30 DIAGNOSIS — I493 Ventricular premature depolarization: Secondary | ICD-10-CM

## 2021-11-30 NOTE — Progress Notes (Signed)
Reason for visit: EKG for flecainide dose change  Name of provider requesting visit: Tommye Standard, PA  H&P: PVCs   Assessment and plan per provider: Patient presents to clinic today for EKG with recent increase in Flecainide dose to 75mg  twice daily. Bisoprolol was also stopped at this time. Patient denies having PVCs or any other symptoms aside from fatigue after eating. EKG reviewed by Tommye Standard, PA. Intervals were stable. No new orders at this time. Patient will continue to monitor symptoms, and keep lab appt on 2/1 and follow-up appt on 2/22.

## 2021-12-07 ENCOUNTER — Other Ambulatory Visit: Payer: Medicare Other | Admitting: *Deleted

## 2021-12-07 ENCOUNTER — Other Ambulatory Visit: Payer: Self-pay

## 2021-12-07 DIAGNOSIS — Z79899 Other long term (current) drug therapy: Secondary | ICD-10-CM

## 2021-12-14 LAB — FLECAINIDE LEVEL: Flecainide: 0.73 ug/ml (ref 0.20–1.00)

## 2021-12-25 NOTE — Progress Notes (Signed)
Cardiology Office Note Date:  12/25/2021  Patient ID:  Michelle, Vazquez [birth year], MRN [id number] PCP:  Binnie Rail, MD  Electrophysiologist: Dr. Lovena Le    Chief Complaint:  planned f/u  History of Present Illness: Michelle Vazquez is a 88 y.o. female with history of PVCs, HTN, MVP, HLD  Afib is listed as part of her history though not discussed by Dr. Lovena Le as a known diagnosis.  She comes in today to be seen for Dr. 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  Noted some DOE with clas II symptoms, planned to update her echo.  LVEF 60-65%, grade II CHF, RVEF ok, Mild mitral valve regurgitation. No  evidence of mitral stenosis. There is moderate holosystolic prolapse of  the middle scallop of the posterior leaflet of the mitral valve Started lasix 20mg  daily though pt declined with concerns of dehydration given her swallowing difficulties and poor oral hydration, she was advised to minimize sodium  She walked into the office 11/15/20 reporting c/o palpitations and being instructed to see [place] by her GI MD BP HR check ed by RN, noted stable and given an appt to be seen.  I saw her 11/17/21 She reports for many years her heart beat has been perfect, though a week or so ago noted an unusual feeling in her heart and when checking her pulse noted intermittently for a couple hours she would feel 2 normal beats then a skipped beat. No CP, no SOB, no no dizziness, near syncope or syncope. No clear change of late, she had esophageal dilation though has had these before. She eats very litte, but always has. Planned for a monitor to assess burden and d/w dr. Lovena Le, noting some increase in PR interval  D/w Dr. Lovena Le, planned to stop the BB and increase flecainide F/u EKG with stable intervals, monitor with minimal PVCs  TODAY She is doing OK, feels some PVCs inn the AM, settle quickly and remain much  improved. She is quite active No exertional intolerances Last weekend spent about 3 hours in the yard raking leaves, no CP, palpitations or unusual SOB. No near syncope or syncope, no dizzy spells  She has an infrequent sleight heavy feeling in her chest, she suspects her esophagus.  Not exertional, no associated symptoms, random, comes goes, a few minutes in duration   AAD hx Flecainide started 2014 (PVCs)   Past Medical History:  Diagnosis Date   Arthritis    Atrial fibrillation ([place])    Bowel obstruction ([place]) 2000   Cataract    bilateral removed   Complication of anesthesia    hard to wake after hysterectomy, states heart stopped during a Esophagoscopy, but states she has had several surgeries and procedures with anesthesia and has never had that again   Dysrhythmia    HLD (hyperlipidemia)    Hypercontractile esophagus    Mitral valve prolapse    Osteoporosis    declines rx and declines DEXA f/u   Parathyroid adenoma    post parathyroid surgery in October of 2008   Paroxysmal supraventricular tachycardia ([place])    Pneumonia    as a child   Premature ventricular contractions (PVCs) (VPCs)    Schatzki's ring 08/2013   Dr. Hilarie Fredrickson, s/p dilation 08/2013, ?benefit   Squamous cell carcinoma in situ of skin    face   Urinary incontinence     Past Surgical History:  Procedure  Laterality Date   ABDOMINAL HYSTERECTOMY  1989   BREAST BIOPSY  1979   left   CATARACT EXTRACTION     bilateral   ESOPHAGEAL MANOMETRY N/A 11/17/2015   Procedure: ESOPHAGEAL MANOMETRY (EM);  Surgeon: Jerene Bears, MD;  [facility];  Service: Gastroenterology;  Laterality: N/A;   ESOPHAGOSCOPY  09/17/2019   Procedure: Cervical Esophagoscopy;  Surgeon: Jerrell Belfast, MD;  Location: [place];  Service: ENT;;   EYE SURGERY     LAPAROSCOPIC SMALL BOWEL RESECTION  2000   LARYNGOSCOPY AND ESOPHAGOSCOPY N/A 10/21/2021   Procedure: DIRECT LARYNGOSCOPY AND ESOPHAGOSCOPY WITH DILATION OF  ESOPHAGEAL NARROWING;  Surgeon: Jerrell Belfast, MD;  Location: [place];  Service: ENT;  Laterality: N/A;   MICROLARYNGOSCOPY WITH DILATION N/A 09/17/2019   Procedure: MICROLARYNGOSCOPY WITH DILATION;  Surgeon: Jerrell Belfast, MD;  Location: [place];  Service: ENT;  Laterality: N/A;   parathyroid surgery  2008?   left   SHOULDER SURGERY  02/1999   right   SKIN LESION EXCISION  08/1999   left side of face    Current Outpatient Medications  Medication Sig Dispense Refill   calcium gluconate 500 MG tablet Take 500 mg by mouth daily. chew     flecainide (TAMBOCOR) 50 MG tablet Take 1.5 tablets (75 mg total) by mouth 2 (two) times daily. 270 tablet 3   Multiple Vitamin (MULTIVITAMIN WITH MINERALS) TABS tablet Take 1 tablet by mouth daily. gummies     Multiple Vitamins-Minerals (VISION-VITE PRESERVE PO) Take 1 tablet by mouth 2 (two) times daily.     No current facility-administered medications for this visit.    Allergies:   Diltiazem hcl, Inderal [propranolol hcl], Lopressor [metoprolol tartrate], and Penicillins   Social History:  The patient  reports that she has never smoked. She has never used smokeless tobacco. She reports that she does not drink alcohol and does not use drugs.   Family History:  The patient's family history includes Arthritis in her father and mother; Colon cancer in her sister; Diabetes in her brother and sister; Heart attack in her father; Heart disease in her father; Hyperlipidemia in her sister; Hypertension in her brother, father, sister, and sister; Stroke in her brother, father, and mother.  ROS:  Please see the history of present illness.    All other systems are reviewed and otherwise negative.   PHYSICAL EXAM:  VS:  There were no vitals taken for this visit. BMI: There is no height or weight on file to calculate BMI. Well nourished, well developed, in no acute distress HEENT: normocephalic, atraumatic Neck: no JVD, carotid bruits or masses Cardiac:  RRR;  no extrasystoles noted with prolonged ausculation, no significant murmurs, no rubs, or gallops Lungs:  CTA b/l, no wheezing, rhonchi or rales Abd: soft, nontender MS: no deformity, age appropriate, perhaps advanced atrophy Ext: no edema Skin: warm and dry, no rash Neuro:  No gross deficits appreciated Psych: euthymic mood, full affect   EKG:  Done today and reviewed by myself shows  SR 99bpm, 1st degree AVblock, manually measured 26ms, QRS 74ms SR 86bpm, 1st degree Avblock  369ms, QRS 48ms, PVCs (2)  Jan 2023, 3 day monitor NSR with sinus tachycardia 2. Non-sustained atrial tachycardia and wandering atrial pacemaker 3. Occasional PVC's with bigeminy and trigeminy.  4. No prolonged pauses 5. No atrial fib   03/16/21: TTE IMPRESSIONS   1. Left ventricular ejection fraction, by estimation, is 60 to 65%. The  left ventricle has normal function. The left  ventricle has no regional  wall motion abnormalities. Left ventricular diastolic parameters are  consistent with Grade II diastolic  dysfunction (pseudonormalization).   2. Right ventricular systolic function is normal. The right ventricular  size is mildly enlarged.   3. Right atrial size was mildly dilated.   4. The mitral valve is myxomatous. Mild mitral valve regurgitation. No  evidence of mitral stenosis. There is moderate holosystolic prolapse of  the middle scallop of the posterior leaflet of the mitral valve.   5. The aortic valve is normal in structure. Aortic valve regurgitation is  mild. No aortic stenosis is present.   6. The inferior vena cava is normal in size with greater than 50%  respiratory variability, suggesting right atrial pressure of 3 mmHg.   Recent Labs: 09/12/2021: ALT 9; TSH 1.40 10/21/2021: Hemoglobin 12.9; Platelets 230 11/17/2021: BUN 21; Creatinine, Ser 1.06; Magnesium 2.1; Potassium 4.7; Sodium 140  09/12/2021: Cholesterol 199; HDL 68.20; LDL Cholesterol 113; Total CHOL/HDL Ratio 3; Triglycerides  85.0; VLDL 17.0   CrCl cannot be calculated (Patient's most recent lab result is older than the maximum 21 days allowed.).   Wt Readings from Last 3 Encounters:  11/30/21 104 lb 3.2 oz (47.3 kg)  11/17/21 104 lb (47.2 kg)  10/21/21 105 lb (47.6 kg)     Other studies reviewed: Additional studies/records reviewed today include: summarized above  ASSESSMENT AND PLAN:  PVCs longstanding Flecainide 1st degree AVblock, without significant changes from last few EKGs, No symptoms of bradycardia HR on exam was 88bpm  2 PVCs on her EKG, none heard with prolonged auscultation Much improved burden   Diastolic CHF  No symptoms or exam findings of volume OL   HTN Looks ok   Disposition: She would like to keep on track with her annual visits with Dr. Lovena Le, due in May, will see her sooner if needed   Current medicines are reviewed at length with the patient today.  The patient did not have any concerns regarding medicines.  Venetia Night, PA-C 12/25/2021 12:50 PM     [place] [address] [phone] (office)  [phone] (fax)

## 2021-12-28 ENCOUNTER — Other Ambulatory Visit: Payer: Self-pay

## 2021-12-28 ENCOUNTER — Encounter: Payer: Self-pay | Admitting: Physician Assistant

## 2021-12-28 ENCOUNTER — Ambulatory Visit (INDEPENDENT_AMBULATORY_CARE_PROVIDER_SITE_OTHER): Payer: Medicare Other | Admitting: Physician Assistant

## 2021-12-28 VITALS — BP 110/82 | HR 99 | Ht 61.0 in | Wt 104.0 lb

## 2021-12-28 DIAGNOSIS — Z5181 Encounter for therapeutic drug level monitoring: Secondary | ICD-10-CM | POA: Diagnosis not present

## 2021-12-28 DIAGNOSIS — I493 Ventricular premature depolarization: Secondary | ICD-10-CM

## 2021-12-28 DIAGNOSIS — Z79899 Other long term (current) drug therapy: Secondary | ICD-10-CM

## 2021-12-28 MED ORDER — FLECAINIDE ACETATE 50 MG PO TABS: 75.0000 mg | ORAL_TABLET | Freq: Two times a day (BID) | ORAL | 3 refills | Status: DC

## 2021-12-28 NOTE — Patient Instructions (Signed)
Medication Instructions:   Your physician recommends that you continue on your current medications as directed. Please refer to the Current Medication list given to you today.  *If you need a refill on your cardiac medications before your next appointment, please call your pharmacy*   Lab Work: [place]   If you have labs (blood work) drawn today and your tests are completely normal, you will receive your results only by: [place] (if you have MyChart) OR A paper copy in the mail If you have any lab test that is abnormal or we need to change your treatment, we will call you to review the results.   Testing/Procedures: NONE ORDERED  TODAY    Follow-Up: At [facility], you and your health needs are our priority.  As part of our continuing mission to provide you with exceptional heart care, we have created designated Provider Care Teams.  These Care Teams include your primary Cardiologist (physician) and Advanced Practice Providers (APPs -  Physician Assistants and Nurse Practitioners) who all work together to provide you with the care you need, when you need it.  We recommend signing up for the patient portal called "MyChart".  Sign up information is provided on this After Visit Summary.  MyChart is used to connect with patients for Virtual Visits (Telemedicine).  Patients are able to view lab/test results, encounter notes, upcoming appointments, etc.  Non-urgent messages can be sent to your provider as well.   To learn more about what you can do with MyChart, go to [email or web address].    Your next appointment:   3 month(s)  The format for your next appointment:   In Person  Provider:   Cristopher Peru, MD{     Other Instructions

## 2022-03-24 ENCOUNTER — Ambulatory Visit: Payer: Medicare Other | Admitting: Internal Medicine

## 2022-03-27 ENCOUNTER — Encounter: Payer: Self-pay | Admitting: Internal Medicine

## 2022-03-27 ENCOUNTER — Ambulatory Visit (INDEPENDENT_AMBULATORY_CARE_PROVIDER_SITE_OTHER): Payer: Medicare Other | Admitting: Internal Medicine

## 2022-03-27 VITALS — BP 138/78 | HR 84 | Ht 62.0 in | Wt 101.8 lb

## 2022-03-27 DIAGNOSIS — R0609 Other forms of dyspnea: Secondary | ICD-10-CM | POA: Diagnosis not present

## 2022-03-27 DIAGNOSIS — I493 Ventricular premature depolarization: Secondary | ICD-10-CM | POA: Diagnosis not present

## 2022-03-27 DIAGNOSIS — I10 Essential (primary) hypertension: Secondary | ICD-10-CM

## 2022-03-27 NOTE — Progress Notes (Signed)
HPI Michelle Vazquez returns today for followup of her PVC's and HTN. She is a pleasant 88 yo woman with a h/o palpitations who has been placed on flecainide. Her PVC's have resolved though we had to increase her dose. She had an echo a year ago which was reassuring as was her heart monitor which showed only NS SVT and rare PVC"s and PAC's. In the interim she notes she is working in her yard. She has trouble swallowing.  Allergies  Allergen Reactions   Diltiazem Hcl Other (See Comments)    Rapid heartbeat and elevated blood pressure   Inderal [Propranolol Hcl]     Pt does not remember reaction   Lopressor [Metoprolol Tartrate]     Per the pt' "I felt like I was going to pass out"   Penicillins Diarrhea     Current Outpatient Medications  Medication Sig Dispense Refill   calcium gluconate 500 MG tablet Take 500 mg by mouth daily. chew     flecainide (TAMBOCOR) 50 MG tablet Take 1.5 tablets (75 mg total) by mouth 2 (two) times daily. 270 tablet 3   Multiple Vitamin (MULTIVITAMIN WITH MINERALS) TABS tablet Take 1 tablet by mouth daily. gummies     Multiple Vitamins-Minerals (VISION-VITE PRESERVE PO) Take 1 tablet by mouth 2 (two) times daily.     No current facility-administered medications for this visit.     Past Medical History:  Diagnosis Date   Arthritis    Atrial fibrillation ([place])    Bowel obstruction ([place]) 2000   Cataract    bilateral removed   Complication of anesthesia    hard to wake after hysterectomy, states heart stopped during a Esophagoscopy, but states she has had several surgeries and procedures with anesthesia and has never had that again   Dysrhythmia    HLD (hyperlipidemia)    Hypercontractile esophagus    Mitral valve prolapse    Osteoporosis    declines rx and declines DEXA f/u   Parathyroid adenoma    post parathyroid surgery in October of 2008   Paroxysmal supraventricular tachycardia ([place])    Pneumonia    as a child   Premature ventricular  contractions (PVCs) (VPCs)    Schatzki's ring 08/2013   Dr. Hilarie Fredrickson, s/p dilation 08/2013, ?benefit   Squamous cell carcinoma in situ of skin    face   Urinary incontinence     ROS:   All systems reviewed and negative except as noted in the HPI.   Past Surgical History:  Procedure Laterality Date   ABDOMINAL HYSTERECTOMY  1989   BREAST BIOPSY  1979   left   CATARACT EXTRACTION     bilateral   ESOPHAGEAL MANOMETRY N/A 11/17/2015   Procedure: ESOPHAGEAL MANOMETRY (EM);  Surgeon: Jerene Bears, MD;  [facility];  Service: Gastroenterology;  Laterality: N/A;   ESOPHAGOSCOPY  09/17/2019   Procedure: Cervical Esophagoscopy;  Surgeon: Jerrell Belfast, MD;  Location: [place];  Service: ENT;;   EYE SURGERY     LAPAROSCOPIC SMALL BOWEL RESECTION  2000   LARYNGOSCOPY AND ESOPHAGOSCOPY N/A 10/21/2021   Procedure: DIRECT LARYNGOSCOPY AND ESOPHAGOSCOPY WITH DILATION OF ESOPHAGEAL NARROWING;  Surgeon: Jerrell Belfast, MD;  Location: [place];  Service: ENT;  Laterality: N/A;   MICROLARYNGOSCOPY WITH DILATION N/A 09/17/2019   Procedure: MICROLARYNGOSCOPY WITH DILATION;  Surgeon: Jerrell Belfast, MD;  Location: [place];  Service: ENT;  Laterality: N/A;   parathyroid surgery  2008?   left   SHOULDER SURGERY  02/1999   right   SKIN LESION EXCISION  08/1999   left side of face     Family History  Problem Relation Age of Onset   Arthritis Father    Heart disease Father    Hypertension Father    Stroke Father    Heart attack Father    Arthritis Mother    Stroke Mother    Stroke Brother    Diabetes Brother    Hypertension Brother    Colon cancer Sister        in her 61's   Hyperlipidemia Sister    Hypertension Sister    Hypertension Sister    Diabetes Sister    Esophageal cancer Neg Hx    Rectal cancer Neg Hx    Stomach cancer Neg Hx      Social History   Socioeconomic History   Marital status: Married    Spouse name: Not on file   Number of children: 4   Years of  education: 12+   Highest education level: Not on file  Occupational History   Occupation: Retired    Comment: Research officer, political party   Tobacco Use   Smoking status: Never   Smokeless tobacco: Never  Vaping Use   Vaping Use: Never used  Substance and Sexual Activity   Alcohol use: No   Drug use: No   Sexual activity: Not Currently  Other Topics Concern   Not on file  Social History Narrative   Regular exercise-sometimes   Caffeine Use-no   married x 2, caregiver of 27yo husband (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) - retired Adult nurse   Social Determinants of Radio broadcast assistant Strain: Low Risk    Difficulty of Paying Living Expenses: Not hard at [place]: No Food Insecurity   Worried About Charity fundraiser in the Last Year: Never true   Arboriculturist in the Last Year: Never true  Transportation Needs: No Transportation Needs   Lack of Transportation (Medical): No   Lack of Transportation (Non-Medical): No  Physical Activity: Sufficiently Active   Days of Exercise per Week: 5 days   Minutes of Exercise per Session: 30 min  Stress: No Stress Concern Present   Feeling of Stress : Not at all  Social Connections: Moderately Integrated   Frequency of Communication with Friends and Family: More than three times a week   Frequency of Social Gatherings with Friends and Family: Once a week   Attends Religious Services: More than 4 times per year   Active Member of [employer] or Organizations: No   Attends Music therapist: More than 4 times per year   Marital Status: Widowed  Human resources officer Violence: Not At Risk   Fear of Current or Ex-Partner: No   Emotionally Abused: No   Physically Abused: No   Sexually Abused: No     BP 138/78   Pulse 84   Ht '5\' 2"'$  (1.575 m)   Wt 101 lb 12.8 oz (46.2 kg)   SpO2 98%   BMI 18.62 kg/m   Physical Exam:  Well appearing NAD HEENT: Unremarkable Neck:  No JVD, no thyromegally Lymphatics:  No adenopathy Back:  No CVA  tenderness Lungs:  Clear HEART:  Regular rate rhythm, no murmurs, no rubs, no clicks Abd:  soft, positive bowel sounds, no organomegally, no rebound, no guarding Ext:  2 plus pulses, no edema, no cyanosis, no clubbing Skin:  No rashes no nodules Neuro:  CN II through  XII intact, motor grossly intact  EKG - nsr with first degree AV block   Assess/Plan:  1. PVC's - she is still asymptomatic. We will continue her flecainide. 2. HTN - her bp is well controlled. She will continue her current meds.  3. Dysphagia - her weight is actually down 3 lbs since her last visit. I encouraged her to be sure to get enough protein. We discussed ways that she might do this. 4. Dyspnea - her symptoms have improved and she is working in her yard which I recommended she continue.   Michelle Overlie Hadar Elgersma,MD

## 2022-03-27 NOTE — Patient Instructions (Signed)
Medication Instructions:  Your physician recommends that you continue on your current medications as directed. Please refer to the Current Medication list given to you today. *If you need a refill on your cardiac medications before your next appointment, please call your pharmacy*  Lab Work: None. If you have labs (blood work) drawn today and your tests are completely normal, you will receive your results only by: MyChart Message (if you have MyChart) OR A paper copy in the mail If you have any lab test that is abnormal or we need to change your treatment, we will call you to review the results.  Testing/Procedures: None.  Follow-Up: At [facility], you and your health needs are our priority.  As part of our continuing mission to provide you with exceptional heart care, we have created designated Provider Care Teams.  These Care Teams include your primary Cardiologist (physician) and Advanced Practice Providers (APPs -  Physician Assistants and Nurse Practitioners) who all work together to provide you with the care you need, when you need it.  Your physician wants you to follow-up in: 12 months with Gregg Taylor, MD or one of the following Advanced Practice Providers on your designated Care Team:    Renee Ursuy, PA-C Michael "Andy" Tillery, PA-C   You will receive a reminder letter in the mail two months in advance. If you don't receive a letter, please call our office to schedule the follow-up appointment.  We recommend signing up for the patient portal called "MyChart".  Sign up information is provided on this After Visit Summary.  MyChart is used to connect with patients for Virtual Visits (Telemedicine).  Patients are able to view lab/test results, encounter notes, upcoming appointments, etc.  Non-urgent messages can be sent to your provider as well.   To learn more about what you can do with MyChart, go to [email or web address].    Any Other Special Instructions Will Be Listed  Below (If Applicable).         

## 2022-06-16 ENCOUNTER — Ambulatory Visit (INDEPENDENT_AMBULATORY_CARE_PROVIDER_SITE_OTHER): Payer: Medicare Other

## 2022-06-16 DIAGNOSIS — Z0000 Encounter for general adult medical examination without abnormal findings: Secondary | ICD-10-CM

## 2022-06-16 NOTE — Progress Notes (Signed)
I connected with Michelle Vazquez today by telephone and verified that I am speaking with the correct person using two identifiers. Location patient: home Location provider: work Persons participating in the virtual visit: patient, provider.   I discussed the limitations, risks, security and privacy concerns of performing an evaluation and management service by telephone and the availability of in person appointments. I also discussed with the patient that there may be a patient responsible charge related to this service. The patient expressed understanding and verbally consented to this telephonic visit.    Interactive audio and video telecommunications were attempted between this provider and patient, however failed, due to patient having technical difficulties OR patient did not have access to video capability.  We continued and completed visit with audio only.  Some vital signs may be absent or patient reported.   Time Spent with patient on telephone encounter: 30 minutes  Subjective:   Michelle Vazquez is a 88 y.o. female who presents for Medicare Annual (Subsequent) preventive examination.  Review of Systems     Cardiac Risk Factors include: advanced age (>24mn, >>48women);family history of premature cardiovascular disease     Objective:    There were no vitals filed for this visit. There is no height or weight on file to calculate BMI.     06/16/2022    8:51 AM 10/21/2021    7:43 AM 06/13/2021   11:19 AM 09/15/2019   11:16 AM  Advanced Directives  Does Patient Have a Medical Advance Directive? Yes Yes Yes No  Type of Advance Directive Living will;Healthcare Power of Attorney Living will;Healthcare Power of Attorney Living will;Healthcare Power of Attorney   Does patient want to make changes to medical advance directive? No - Patient declined No - Patient declined No - Patient declined   [place] of [place]? No - copy requested  No - copy requested   Would  patient like information on creating a medical advance directive?    No - Patient declined    Current Medications (verified) Outpatient Encounter Medications as of 06/16/2022  Medication Sig   calcium gluconate 500 MG tablet Take 500 mg by mouth daily. chew   flecainide (TAMBOCOR) 50 MG tablet Take 1.5 tablets (75 mg total) by mouth 2 (two) times daily.   Multiple Vitamin (MULTIVITAMIN WITH MINERALS) TABS tablet Take 1 tablet by mouth daily. gummies   Multiple Vitamins-Minerals (VISION-VITE PRESERVE PO) Take 1 tablet by mouth 2 (two) times daily.   No facility-administered encounter medications on file as of 06/16/2022.    Allergies (verified) Diltiazem hcl, Inderal [propranolol hcl], Lopressor [metoprolol tartrate], and Penicillins   History: Past Medical History:  Diagnosis Date   Arthritis    Atrial fibrillation ([facility])    Bowel obstruction ([place] 2000   Cataract    bilateral removed   Complication of anesthesia    hard to wake after hysterectomy, states heart stopped during a Esophagoscopy, but states she has had several surgeries and procedures with anesthesia and has never had that again   Dysrhythmia    HLD (hyperlipidemia)    Hypercontractile esophagus    Mitral valve prolapse    Osteoporosis    declines rx and declines DEXA f/u   Parathyroid adenoma    post parathyroid surgery in October of 2008   Paroxysmal supraventricular tachycardia ([place]    Pneumonia    as a child   Premature ventricular contractions (PVCs) (VPCs)    Schatzki's ring 08/2013   Dr. PHilarie Fredrickson  s/p dilation 08/2013, ?benefit   Squamous cell carcinoma in situ of skin    face   Urinary incontinence    Past Surgical History:  Procedure Laterality Date   ABDOMINAL HYSTERECTOMY  1989   BREAST BIOPSY  1979   left   CATARACT EXTRACTION     bilateral   ESOPHAGEAL MANOMETRY N/A 11/17/2015   Procedure: ESOPHAGEAL MANOMETRY (EM);  Surgeon: Jerene Bears, MD;  [facility];  Service:  Gastroenterology;  Laterality: N/A;   ESOPHAGOSCOPY  09/17/2019   Procedure: Cervical Esophagoscopy;  Surgeon: Jerrell Belfast, MD;  Location: [place];  Service: ENT;;   EYE SURGERY     LAPAROSCOPIC SMALL BOWEL RESECTION  2000   LARYNGOSCOPY AND ESOPHAGOSCOPY N/A 10/21/2021   Procedure: DIRECT LARYNGOSCOPY AND ESOPHAGOSCOPY WITH DILATION OF ESOPHAGEAL NARROWING;  Surgeon: Jerrell Belfast, MD;  Location: [place];  Service: ENT;  Laterality: N/A;   MICROLARYNGOSCOPY WITH DILATION N/A 09/17/2019   Procedure: MICROLARYNGOSCOPY WITH DILATION;  Surgeon: Jerrell Belfast, MD;  Location: [place];  Service: ENT;  Laterality: N/A;   parathyroid surgery  2008?   left   SHOULDER SURGERY  02/1999   right   SKIN LESION EXCISION  08/1999   left side of face   Family History  Problem Relation Age of Onset   Arthritis Father    Heart disease Father    Hypertension Father    Stroke Father    Heart attack Father    Arthritis Mother    Stroke Mother    Stroke Brother    Diabetes Brother    Hypertension Brother    Colon cancer Sister        in her 68's   Hyperlipidemia Sister    Hypertension Sister    Hypertension Sister    Diabetes Sister    Esophageal cancer Neg Hx    Rectal cancer Neg Hx    Stomach cancer Neg Hx    Social History   Socioeconomic History   Marital status: Married    Spouse name: Not on file   Number of children: 4   Years of education: 12+   Highest education level: Not on file  Occupational History   Occupation: Retired    Comment: Research officer, political party   Tobacco Use   Smoking status: Never   Smokeless tobacco: Never  Vaping Use   Vaping Use: Never used  Substance and Sexual Activity   Alcohol use: No   Drug use: No   Sexual activity: Not Currently  Other Topics Concern   Not on file  Social History Narrative   Regular exercise-sometimes   Caffeine Use-no   married x 2, caregiver of 57yo husband (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) - retired Adult nurse   Social Determinants of  Health   Financial Resource Strain: [place]  (06/16/2022)   Overall Financial Resource Strain (CARDIA)    Difficulty of Paying Living Expenses: Not hard at all  Food Insecurity: No Food Insecurity (06/16/2022)   Hunger Vital Sign    Worried About Running Out of Food in the Last Year: Never true    Ran Out of Food in the Last Year: Never true  Transportation Needs: No Transportation Needs (06/16/2022)   PRAPARE - Hydrologist (Medical): No    Lack of Transportation (Non-Medical): No  Physical Activity: Sufficiently Active (06/16/2022)   Exercise Vital Sign    Days of Exercise per Week: 5 days    Minutes of Exercise per Session: 30 min  Stress: No Stress Concern Present (06/16/2022)   [place]    Feeling of Stress : Not at all  Social Connections: Moderately Integrated (06/16/2022)   Social Connection and Isolation Panel [NHANES]    Frequency of Communication with Friends and Family: More than three times a week    Frequency of Social Gatherings with Friends and Family: Once a week    Attends Religious Services: More than 4 times per year    Active Member of [employer] or Organizations: No    Attends Music therapist: More than 4 times per year    Marital Status: Widowed    Tobacco Counseling Counseling given: Not Answered   Clinical Intake:  Pre-visit preparation completed: Yes  Pain : No/denies pain     Nutritional Risks: None Diabetes: No  How often do you need to have someone help you when you read instructions, pamphlets, or other written materials from your doctor or pharmacy?: 1 - Never What is the last grade level you completed in school?: HSG; X ray Training  Diabetic? no  Interpreter Needed?: No  Information entered by :: Lisette Abu, LPN   Activities of Daily Living    06/16/2022    9:03 AM 10/21/2021    7:56 AM  In your present state of health,  do you have any difficulty performing the following activities:  Hearing? 0   Vision? 0   Difficulty concentrating or making decisions? 0   Walking or climbing stairs? 0   Dressing or bathing? 0   Doing errands, shopping? 0 0  Preparing Food and eating ? N   Using the Toilet? N   In the past six months, have you accidently leaked urine? Y   Do you have problems with loss of bowel control? N   Managing your Medications? N   Managing your Finances? N   Housekeeping or managing your Housekeeping? N     Patient Care Team: Binnie Rail, MD as PCP - General (Internal Medicine) Darlin Coco, MD (Cardiology) Pyrtle, Lajuan Lines, MD (Gastroenterology) Evans Lance, MD (Cardiology) [facility] as Referring Physician (Ophthalmology)  Indicate any recent Medical Services you may have received from other than [facility] providers in the past year (date may be approximate).     Assessment:   This is a routine wellness examination for [place].  Hearing/Vision screen Hearing Screening - Comments:: Patient denied any hearing difficulty.   No hearing aids.   Vision Screening - Comments:: Patient does wear corrective lenses/contacts.  Eye exam done by: Princess Bruins, MD.   Dietary issues and exercise activities discussed: Current Exercise Habits: Home exercise routine, Type of exercise: walking, Time (Minutes): 30, Frequency (Times/Week): 5, Weekly Exercise (Minutes/Week): 150, Intensity: Mild, Exercise limited by: None identified   Goals Addressed             This Visit's Progress    To maintain my current health status.        Depression Screen    06/16/2022    8:52 AM 06/13/2021   11:24 AM 09/08/2020    8:16 AM 09/08/2019    8:00 AM 09/06/2018    9:33 AM 09/04/2017    8:57 AM 09/04/2017    8:53 AM  PHQ 2/9 Scores  PHQ - 2 Score 0 0 0 0 0 0 0  PHQ- 9 Score     0      Fall Risk    06/16/2022  8:52 AM 06/13/2021   11:21 AM 09/08/2020    8:12 AM 09/08/2019     8:00 AM 09/20/2018    1:31 PM  Fall Risk   Falls in the past year? 0 0 0 0 0  Comment     Emmi Telephone Survey: data to providers prior to load  Number falls in past yr: 0 0 0 0   Injury with Fall? 0 0 0    Risk for fall due to : No Fall Risks No Fall Risks No Fall Risks    Follow up Falls evaluation completed Falls evaluation completed Falls evaluation completed      [place]:  Any stairs in or around the home? No  If so, are there any without handrails? No  Home free of loose throw rugs in walkways, pet beds, electrical cords, etc? Yes  Adequate lighting in your home to reduce risk of falls? Yes   ASSISTIVE DEVICES UTILIZED TO PREVENT FALLS:  Life alert? No  Use of a cane, walker or w/c? No  Grab bars in the bathroom? Yes  Shower chair or bench in shower? Yes  Elevated toilet seat or a handicapped toilet? No   TIMED UP AND GO:  Was the test performed? No .  Length of time to ambulate 10 feet: n/a sec.   Appearance of gait: Gait not evaluated during this visit.  Cognitive Function:        06/16/2022    9:03 AM  6CIT Screen  What Year? 0 points  What month? 0 points  What time? 0 points  Count back from 20 0 points  Months in reverse 0 points  Repeat phrase 0 points  Total Score 0 points    Immunizations Immunization History  Administered Date(s) Administered   Fluad Quad(high Dose 65+) 09/08/2019, 09/08/2020, 09/12/2021   Influenza, High Dose Seasonal PF 08/04/2014, 08/28/2016, 09/04/2017, 09/06/2018   Influenza,inj,Quad PF,6+ Mos 08/04/2013, 08/25/2015   PFIZER(Purple Top)SARS-COV-2 Vaccination 11/27/2019, 12/18/2019, 08/25/2020   PNEUMOCOCCAL CONJUGATE-20 09/12/2021   Pneumococcal Conjugate-13 08/25/2015   Pneumococcal Polysaccharide-23 06/21/2012   Td 11/06/2009   Zoster, Live 06/30/2011    TDAP status: Due, Education has been provided regarding the importance of this vaccine. Advised may receive this vaccine at  local pharmacy or Health Dept. Aware to provide a copy of the vaccination record if obtained from local pharmacy or Health Dept. Verbalized acceptance and understanding.  Flu Vaccine status: Up to date  Pneumococcal vaccine status: Up to date  Covid-19 vaccine status: Completed vaccines  Qualifies for Shingles Vaccine? Yes   Zostavax completed Yes   Shingrix Completed?: No.    Education has been provided regarding the importance of this vaccine. Patient has been advised to call insurance company to determine out of pocket expense if they have not yet received this vaccine. Advised may also receive vaccine at local pharmacy or Health Dept. Verbalized acceptance and understanding.  Screening Tests Health Maintenance  Topic Date Due   Zoster Vaccines- Shingrix (1 of 2) Never done   COVID-19 Vaccine (4 - Pfizer risk series) 10/20/2020   INFLUENZA VACCINE  06/06/2022   TETANUS/TDAP  09/12/2022 (Originally 11/07/2019)   DEXA SCAN  11/06/2028 (Originally 01/16/2002)   Pneumonia Vaccine 46+ Years old  Completed   HPV VACCINES  Aged Out    Health Maintenance  Health Maintenance Due  Topic Date Due   Zoster Vaccines- Shingrix (1 of 2) Never done   COVID-19 Vaccine (4 - Pfizer risk  series) 10/20/2020   INFLUENZA VACCINE  06/06/2022    Colorectal cancer screening: No longer required.   Mammogram status: No longer required due to age.  Bone Density status: No longer required due to age  Lung Cancer Screening: (Low Dose CT Chest recommended if Age 64-80 years, 30 pack-year currently smoking OR have quit w/in 15years.) does not qualify.   Lung Cancer Screening Referral: no  Additional Screening:  Hepatitis C Screening: does not qualify; Completed no  Vision Screening: Recommended annual ophthalmology exams for early detection of glaucoma and other disorders of the eye. Is the patient up to date with their annual eye exam?  Yes  Who is the provider or what is the name of the office in  which the patient attends annual eye exams? [facility] If pt is not established with a provider, would they like to be referred to a provider to establish care? No .   Dental Screening: Recommended annual dental exams for proper oral hygiene  Community Resource Referral / Chronic Care Management: CRR required this visit?  No   CCM required this visit?  No      Plan:     I have personally reviewed and noted the following in the patient's chart:   Medical and social history Use of alcohol, tobacco or illicit drugs  Current medications and supplements including opioid prescriptions.  Functional ability and status Nutritional status Physical activity Advanced directives List of other physicians Hospitalizations, surgeries, and ER visits in previous 12 months Vitals Screenings to include cognitive, depression, and falls Referrals and appointments  In addition, I have reviewed and discussed with patient certain preventive protocols, quality metrics, and best practice recommendations. A written personalized care plan for preventive services as well as general preventive health recommendations were provided to patient.     Sheral Flow, LPN   7/58/8325   Nurse Notes:  There were no vitals filed for this visit. There is no height or weight on file to calculate BMI. Patient stated that she has no issues with gait or balance; does not use any assistive devices. Medications reviewed with patient; no opioid use noted.

## 2022-06-16 NOTE — Patient Instructions (Signed)
Michelle Vazquez , Thank you for taking time to come for your Medicare Wellness Visit. I appreciate your ongoing commitment to your health goals. Please review the following plan we discussed and let me know if I can assist you in the future.   Screening recommendations/referrals: Colonoscopy: No longer recommended due to age. Mammogram: No longer recommended due to age. Bone Density: No longer recommended due to age. Recommended yearly ophthalmology/optometry visit for glaucoma screening and checkup Recommended yearly dental visit for hygiene and checkup  Vaccinations: Influenza vaccine: 09/12/2021 Pneumococcal vaccine: 06/21/2012, 08/25/2015, 09/12/2021 Tdap vaccine: due Shingles vaccine: never done Zoster vaccine: 06/30/2011   Covid-19: 11/27/2019, 12/18/2019, 08/25/2020  Advanced directives: Yes  Conditions/risks identified: Yes  Next appointment: Please schedule your next Medicare Wellness Visit with your Nurse Health Advisor in 1 year by calling [phone].   Preventive Care 96 Years and Older, Female Preventive care refers to lifestyle choices and visits with your health care provider that can promote health and wellness. What does preventive care include? A yearly physical exam. This is also called an annual well check. Dental exams once or twice a year. Routine eye exams. Ask your health care provider how often you should have your eyes checked. Personal lifestyle choices, including: Daily care of your teeth and gums. Regular physical activity. Eating a healthy diet. Avoiding tobacco and drug use. Limiting alcohol use. Practicing safe sex. Taking low-dose aspirin every day. Taking vitamin and mineral supplements as recommended by your health care provider. What happens during an annual well check? The services and screenings done by your health care provider during your annual well check will depend on your age, overall health, lifestyle risk factors, and family history of  disease. Counseling  Your health care provider may ask you questions about your: Alcohol use. Tobacco use. Drug use. Emotional well-being. Home and relationship well-being. Sexual activity. Eating habits. History of falls. Memory and ability to understand (cognition). Work and work Statistician. Reproductive health. Screening  You may have the following tests or measurements: Height, weight, and BMI. Blood pressure. Lipid and cholesterol levels. These may be checked every 5 years, or more frequently if you are over 33 years old. Skin check. Lung cancer screening. You may have this screening every year starting at age 87 if you have a 30-pack-year history of smoking and currently smoke or have quit within the past 15 years. Fecal occult blood test (FOBT) of the stool. You may have this test every year starting at age 54. Flexible sigmoidoscopy or colonoscopy. You may have a sigmoidoscopy every 5 years or a colonoscopy every 10 years starting at age 74. Hepatitis C blood test. Hepatitis B blood test. Sexually transmitted disease (STD) testing. Diabetes screening. This is done by checking your blood sugar (glucose) after you have not eaten for a while (fasting). You may have this done every 1-3 years. Bone density scan. This is done to screen for osteoporosis. You may have this done starting at age 38. Mammogram. This may be done every 1-2 years. Talk to your health care provider about how often you should have regular mammograms. Talk with your health care provider about your test results, treatment options, and if necessary, the need for more tests. Vaccines  Your health care provider may recommend certain vaccines, such as: Influenza vaccine. This is recommended every year. Tetanus, diphtheria, and acellular pertussis (Tdap, Td) vaccine. You may need a Td booster every 10 years. Zoster vaccine. You may need this after age 40. Pneumococcal 13-valent conjugate (PCV13)  vaccine. One  dose is recommended after age 34. Pneumococcal polysaccharide (PPSV23) vaccine. One dose is recommended after age 43. Talk to your health care provider about which screenings and vaccines you need and how often you need them. This information is not intended to replace advice given to you by your health care provider. Make sure you discuss any questions you have with your health care provider. Document Released: 11/19/2015 Document Revised: 07/12/2016 Document Reviewed: 08/24/2015 Elsevier Interactive Patient Education  2017 [place] Prevention in the Home Falls can cause injuries. They can happen to people of all ages. There are many things you can do to make your home safe and to help prevent falls. What can I do on the outside of my home? Regularly fix the edges of walkways and driveways and fix any cracks. Remove anything that might make you trip as you walk through a door, such as a raised step or threshold. Trim any bushes or trees on the path to your home. Use bright outdoor lighting. Clear any walking paths of anything that might make someone trip, such as rocks or tools. Regularly check to see if handrails are loose or broken. Make sure that both sides of any steps have handrails. Any raised decks and porches should have guardrails on the edges. Have any leaves, snow, or ice cleared regularly. Use sand or salt on walking paths during winter. Clean up any spills in your garage right away. This includes oil or grease spills. What can I do in the bathroom? Use night lights. Install grab bars by the toilet and in the tub and shower. Do not use towel bars as grab bars. Use non-skid mats or decals in the tub or shower. If you need to sit down in the shower, use a plastic, non-slip stool. Keep the floor dry. Clean up any water that spills on the floor as soon as it happens. Remove soap buildup in the tub or shower regularly. Attach bath mats securely with double-sided  non-slip rug tape. Do not have throw rugs and other things on the floor that can make you trip. What can I do in the bedroom? Use night lights. Make sure that you have a light by your bed that is easy to reach. Do not use any sheets or blankets that are too big for your bed. They should not hang down onto the floor. Have a firm chair that has side arms. You can use this for support while you get dressed. Do not have throw rugs and other things on the floor that can make you trip. What can I do in the kitchen? Clean up any spills right away. Avoid walking on wet floors. Keep items that you use a lot in easy-to-reach places. If you need to reach something above you, use a strong step stool that has a grab bar. Keep electrical cords out of the way. Do not use floor polish or wax that makes floors slippery. If you must use wax, use non-skid floor wax. Do not have throw rugs and other things on the floor that can make you trip. What can I do with my stairs? Do not leave any items on the stairs. Make sure that there are handrails on both sides of the stairs and use them. Fix handrails that are broken or loose. Make sure that handrails are as long as the stairways. Check any carpeting to make sure that it is firmly attached to the stairs. Fix any carpet that is loose  or worn. Avoid having throw rugs at the top or bottom of the stairs. If you do have throw rugs, attach them to the floor with carpet tape. Make sure that you have a light switch at the top of the stairs and the bottom of the stairs. If you do not have them, ask someone to add them for you. What else can I do to help prevent falls? Wear shoes that: Do not have high heels. Have rubber bottoms. Are comfortable and fit you well. Are closed at the toe. Do not wear sandals. If you use a stepladder: Make sure that it is fully opened. Do not climb a closed stepladder. Make sure that both sides of the stepladder are locked into place. Ask  someone to hold it for you, if possible. Clearly mark and make sure that you can see: Any grab bars or handrails. First and last steps. Where the edge of each step is. Use tools that help you move around (mobility aids) if they are needed. These include: Canes. Walkers. Scooters. Crutches. Turn on the lights when you go into a dark area. Replace any light bulbs as soon as they burn out. Set up your furniture so you have a clear path. Avoid moving your furniture around. If any of your floors are uneven, fix them. If there are any pets around you, be aware of where they are. Review your medicines with your doctor. Some medicines can make you feel dizzy. This can increase your chance of falling. Ask your doctor what other things that you can do to help prevent falls. This information is not intended to replace advice given to you by your health care provider. Make sure you discuss any questions you have with your health care provider. Document Released: 08/19/2009 Document Revised: 03/30/2016 Document Reviewed: 11/27/2014 Elsevier Interactive Patient Education  2017 [employer].

## 2022-06-23 DIAGNOSIS — D3131 Benign neoplasm of right choroid: Secondary | ICD-10-CM | POA: Diagnosis not present

## 2022-06-23 DIAGNOSIS — H43813 Vitreous degeneration, bilateral: Secondary | ICD-10-CM | POA: Diagnosis not present

## 2022-06-23 DIAGNOSIS — H35372 Puckering of macula, left eye: Secondary | ICD-10-CM | POA: Diagnosis not present

## 2022-06-23 DIAGNOSIS — H353132 Nonexudative age-related macular degeneration, bilateral, intermediate dry stage: Secondary | ICD-10-CM | POA: Diagnosis not present

## 2022-07-11 DIAGNOSIS — H353131 Nonexudative age-related macular degeneration, bilateral, early dry stage: Secondary | ICD-10-CM | POA: Diagnosis not present

## 2022-07-11 DIAGNOSIS — D3131 Benign neoplasm of right choroid: Secondary | ICD-10-CM | POA: Diagnosis not present

## 2022-07-20 ENCOUNTER — Other Ambulatory Visit: Payer: Self-pay | Admitting: Otolaryngology

## 2022-07-25 ENCOUNTER — Encounter (HOSPITAL_COMMUNITY): Payer: Self-pay | Admitting: Otolaryngology

## 2022-07-25 ENCOUNTER — Other Ambulatory Visit: Payer: Self-pay

## 2022-07-25 NOTE — Progress Notes (Addendum)
SDW CALL  Patient was given pre-op instructions over the phone. The opportunity was given for the patient to ask questions. No further questions asked. Patient verbalized understanding of instructions given.   PCP - Billey Gosling MD Cardiologist - Cristopher Peru MD  PPM/ICD - denies Device Orders -  Rep Notified -   Chest x-ray - 09/06/18 EKG - 03/28/22 Stress Test - 10/16/13 ECHO - 03/16/21 Cardiac Cath - 06/19/07  Sleep Study - denies CPAP -   Fasting Blood Sugar - na Checks Blood Sugar _____ times a day  Blood Thinner Instructions:na Aspirin Instructions:na  ERAS Protcol - clear liquids until 0800 PRE-SURGERY Ensure or G2- no  COVID TEST- DOS   Anesthesia review: yes-hx afib not undergoing cardiac procedure  Patient denies shortness of breath, fever, cough and chest pain over the phone call    Surgical Instructions    Your procedure is scheduled on Wednesday Sept. 20.  Report to [facility] Main Entrance "A" at 8:30 A.M., then check in with the Admitting office.  Call this number if you have problems the morning of surgery:  [phone]    Remember:  Do not eat after midnight the night before your surgery  You may drink clear liquids until 0800 the morning of your surgery.   Clear liquids allowed are: Water, Non-Citrus Juices (without pulp), Carbonated Beverages, Clear Tea, Black Coffee ONLY (NO MILK, CREAM OR POWDERED CREAMER of any kind), and Gatorade   Take these medicines the morning of surgery with A SIP OF WATER:  flecainide (TAMBOCOR)   As of today, STOP taking any Aspirin (unless otherwise instructed by your surgeon) Aleve, Naproxen, Ibuprofen, Motrin, Advil, Goody's, BC's, all herbal medications, fish oil, and all vitamins.  [facility] is not responsible for any belongings or valuables. .   Do NOT Smoke (Tobacco/Vaping)  24 hours prior to your procedure  If you use a CPAP at night, you may bring your mask for your overnight stay.   Contacts,  glasses, hearing aids, dentures or partials may not be worn into surgery, please bring cases for these belongings   Patients discharged the day of surgery will not be allowed to drive home, and someone needs to stay with them for 24 hours.   SURGICAL WAITING ROOM VISITATION You may have 1 visitor in the pre-op area at a time determined by the pre-op nurse. (Visitor may not switch out) Patients having surgery or a procedure in a hospital may have two support people in the waiting room. Children under the age of 31 must have an adult with them who is not the patient. They may stay in the waiting area during the procedure and may switch out with other visitors. If the patient needs to stay at the hospital during part of their recovery, the visitor guidelines for inpatient rooms apply.  Please refer to the [facility] website for the visitor guidelines for Inpatients (after your surgery is over and you are in a regular room).     Special instructions:    Oral Hygiene is also important to reduce your risk of infection.  Remember - BRUSH YOUR TEETH THE MORNING OF SURGERY WITH YOUR REGULAR TOOTHPASTE   Day of Surgery:  Take a shower the day of or night before with antibacterial soap. Wear Clean/Comfortable clothing the morning of surgery Do not apply any deodorants/lotions.   Do not wear jewelry or makeup Do not wear lotions, powders, perfumes/colognes, or deodorant. Do not shave 48 hours prior to surgery.  Men  may shave face and neck. Do not bring valuables to the hospital. Do not wear nail polish, gel polish, artificial nails, or any other type of covering on natural nails (fingers and toes) If you have artificial nails or gel coating that need to be removed by a nail salon, please have this removed prior to surgery. Artificial nails or gel coating may interfere with anesthesia's ability to adequately monitor your vital signs. Remember to brush your teeth WITH YOUR REGULAR  TOOTHPASTE.

## 2022-07-26 ENCOUNTER — Encounter (HOSPITAL_COMMUNITY)
Admission: RE | Disposition: A | Discharge status: Home / Self Care | Payer: Self-pay | Source: Home / Self Care | Attending: Otolaryngology

## 2022-07-26 ENCOUNTER — Encounter (HOSPITAL_COMMUNITY): Payer: Self-pay | Admitting: Otolaryngology

## 2022-07-26 ENCOUNTER — Ambulatory Visit (HOSPITAL_BASED_OUTPATIENT_CLINIC_OR_DEPARTMENT_OTHER): Payer: Medicare Other | Admitting: Certified Registered"

## 2022-07-26 ENCOUNTER — Other Ambulatory Visit: Payer: Self-pay

## 2022-07-26 ENCOUNTER — Ambulatory Visit (HOSPITAL_COMMUNITY)
Admission: RE | Admit: 2022-07-26 | Discharge: 2022-07-26 | Disposition: A | Discharge status: Home / Self Care | Payer: Medicare Other | Attending: Otolaryngology | Admitting: Otolaryngology

## 2022-07-26 ENCOUNTER — Ambulatory Visit (HOSPITAL_COMMUNITY): Payer: Medicare Other | Admitting: Certified Registered"

## 2022-07-26 DIAGNOSIS — N289 Disorder of kidney and ureter, unspecified: Secondary | ICD-10-CM | POA: Insufficient documentation

## 2022-07-26 DIAGNOSIS — I5032 Chronic diastolic (congestive) heart failure: Secondary | ICD-10-CM | POA: Insufficient documentation

## 2022-07-26 DIAGNOSIS — I110 Hypertensive heart disease with heart failure: Secondary | ICD-10-CM | POA: Insufficient documentation

## 2022-07-26 DIAGNOSIS — I341 Nonrheumatic mitral (valve) prolapse: Secondary | ICD-10-CM | POA: Diagnosis not present

## 2022-07-26 DIAGNOSIS — I4891 Unspecified atrial fibrillation: Secondary | ICD-10-CM | POA: Insufficient documentation

## 2022-07-26 DIAGNOSIS — Z20822 Contact with and (suspected) exposure to covid-19: Secondary | ICD-10-CM | POA: Diagnosis not present

## 2022-07-26 DIAGNOSIS — K222 Esophageal obstruction: Secondary | ICD-10-CM | POA: Diagnosis not present

## 2022-07-26 DIAGNOSIS — I340 Nonrheumatic mitral (valve) insufficiency: Secondary | ICD-10-CM | POA: Insufficient documentation

## 2022-07-26 HISTORY — PX: LARYNGOSCOPY AND ESOPHAGOSCOPY: SHX5660

## 2022-07-26 LAB — CBC
HCT: 41 % (ref 36.0–46.0)
Hemoglobin: 13.9 g/dL (ref 12.0–15.0)
MCH: 32.2 pg (ref 26.0–34.0)
MCHC: 33.9 g/dL (ref 30.0–36.0)
MCV: 94.9 fL (ref 80.0–100.0)
Platelets: 248 10*3/uL (ref 150–400)
RBC: 4.32 MIL/uL (ref 3.87–5.11)
RDW: 12.4 % (ref 11.5–15.5)
WBC: 6.9 10*3/uL (ref 4.0–10.5)
nRBC: 0 % (ref 0.0–0.2)

## 2022-07-26 LAB — BASIC METABOLIC PANEL
Anion gap: 8 (ref 5–15)
BUN: 26 mg/dL — ABNORMAL HIGH (ref 8–23)
CO2: 32 mmol/L (ref 22–32)
Calcium: 9.5 mg/dL (ref 8.9–10.3)
Chloride: 99 mmol/L (ref 98–111)
Creatinine, Ser: 1.09 mg/dL — ABNORMAL HIGH (ref 0.44–1.00)
GFR, Estimated: 48 mL/min — ABNORMAL LOW (ref >60)
Glucose, Bld: 101 mg/dL — ABNORMAL HIGH (ref 70–99)
Potassium: 3.7 mmol/L (ref 3.5–5.1)
Sodium: 139 mmol/L (ref 135–145)

## 2022-07-26 LAB — SARS CORONAVIRUS 2 BY RT PCR: SARS Coronavirus 2 by RT PCR: NEGATIVE

## 2022-07-26 SURGERY — LARYNGOSCOPY, WITH ESOPHAGOSCOPY
Anesthesia: General

## 2022-07-26 MED ORDER — DEXMEDETOMIDINE HCL IN NACL 80 MCG/20ML IV SOLN
INTRAVENOUS | Status: AC
  Filled 2022-07-26: qty 20

## 2022-07-26 MED ORDER — OXYMETAZOLINE HCL 0.05 % NA SOLN
NASAL | Status: DC | PRN
  Administered 2022-07-26: 1

## 2022-07-26 MED ORDER — OXYMETAZOLINE HCL 0.05 % NA SOLN
NASAL | Status: AC
  Filled 2022-07-26: qty 30

## 2022-07-26 MED ORDER — ONDANSETRON HCL 4 MG/2ML IJ SOLN
INTRAMUSCULAR | Status: DC | PRN
  Administered 2022-07-26: 4 mg via INTRAVENOUS

## 2022-07-26 MED ORDER — ONDANSETRON HCL 4 MG/2ML IJ SOLN
INTRAMUSCULAR | Status: AC
  Filled 2022-07-26: qty 2

## 2022-07-26 MED ORDER — LACTATED RINGERS IV SOLN: INTRAVENOUS | Status: DC

## 2022-07-26 MED ORDER — FENTANYL CITRATE (PF) 250 MCG/5ML IJ SOLN
INTRAMUSCULAR | Status: AC
  Filled 2022-07-26: qty 5

## 2022-07-26 MED ORDER — LIDOCAINE 2% (20 MG/ML) 5 ML SYRINGE
INTRAMUSCULAR | Status: AC
  Filled 2022-07-26: qty 5

## 2022-07-26 MED ORDER — ROCURONIUM BROMIDE 100 MG/10ML IV SOLN
INTRAVENOUS | Status: DC | PRN
  Administered 2022-07-26: 30 mg via INTRAVENOUS

## 2022-07-26 MED ORDER — PHENYLEPHRINE 80 MCG/ML (10ML) SYRINGE FOR IV PUSH (FOR BLOOD PRESSURE SUPPORT)
PREFILLED_SYRINGE | INTRAVENOUS | Status: DC | PRN
  Administered 2022-07-26: 80 ug via INTRAVENOUS
  Administered 2022-07-26: 40 ug via INTRAVENOUS

## 2022-07-26 MED ORDER — DEXAMETHASONE SODIUM PHOSPHATE 10 MG/ML IJ SOLN
INTRAMUSCULAR | Status: AC
  Filled 2022-07-26: qty 1

## 2022-07-26 MED ORDER — FENTANYL CITRATE (PF) 100 MCG/2ML IJ SOLN: 25.0000 ug | INTRAMUSCULAR | Status: DC | PRN

## 2022-07-26 MED ORDER — ACETAMINOPHEN 325 MG PO TABS: 650.0000 mg | ORAL_TABLET | Freq: Once | ORAL | Status: DC

## 2022-07-26 MED ORDER — PROPOFOL 10 MG/ML IV BOLUS
INTRAVENOUS | Status: AC
  Filled 2022-07-26: qty 20

## 2022-07-26 MED ORDER — SUGAMMADEX SODIUM 200 MG/2ML IV SOLN
INTRAVENOUS | Status: DC | PRN
  Administered 2022-07-26: 150 mg via INTRAVENOUS

## 2022-07-26 MED ORDER — PROPOFOL 10 MG/ML IV BOLUS
INTRAVENOUS | Status: DC | PRN
  Administered 2022-07-26: 100 mg via INTRAVENOUS

## 2022-07-26 MED ORDER — ONDANSETRON HCL 4 MG/2ML IJ SOLN: 4.0000 mg | Freq: Once | INTRAMUSCULAR | Status: DC | PRN

## 2022-07-26 MED ORDER — 0.9 % SODIUM CHLORIDE (POUR BTL) OPTIME
TOPICAL | Status: DC | PRN
  Administered 2022-07-26: 1000 mL

## 2022-07-26 MED ORDER — ORAL CARE MOUTH RINSE: 15.0000 mL | Freq: Once | OROMUCOSAL | Status: AC

## 2022-07-26 MED ORDER — FENTANYL CITRATE (PF) 100 MCG/2ML IJ SOLN
INTRAMUSCULAR | Status: DC | PRN
  Administered 2022-07-26 (×2): 25 ug via INTRAVENOUS

## 2022-07-26 MED ORDER — LIDOCAINE 2% (20 MG/ML) 5 ML SYRINGE
INTRAMUSCULAR | Status: DC | PRN
  Administered 2022-07-26: 40 mg via INTRAVENOUS

## 2022-07-26 MED ORDER — CHLORHEXIDINE GLUCONATE 0.12 % MT SOLN
OROMUCOSAL | Status: AC
  Administered 2022-07-26: 15 mL via OROMUCOSAL
  Filled 2022-07-26: qty 15

## 2022-07-26 MED ORDER — ROCURONIUM BROMIDE 10 MG/ML (PF) SYRINGE
PREFILLED_SYRINGE | INTRAVENOUS | Status: AC
  Filled 2022-07-26: qty 10

## 2022-07-26 MED ORDER — CHLORHEXIDINE GLUCONATE 0.12 % MT SOLN: 15.0000 mL | Freq: Once | OROMUCOSAL | Status: AC

## 2022-07-26 SURGICAL SUPPLY — 22 items
BAG COUNTER SPONGE SURGICOUNT (BAG) ×1 IMPLANT
BALLN PULM 15 16.5 18X75 (BALLOONS)
BALLOON PULM 15 16.5 18X75 (BALLOONS) IMPLANT
CANISTER SUCT 3000ML PPV (MISCELLANEOUS) ×1 IMPLANT
CNTNR URN SCR LID CUP LEK RST (MISCELLANEOUS) IMPLANT
CONT SPEC 4OZ STRL OR WHT (MISCELLANEOUS)
COVER BACK TABLE 60X90IN (DRAPES) ×1 IMPLANT
DRAPE HALF SHEET 40X57 (DRAPES) ×1 IMPLANT
GAUZE SPONGE 4X4 12PLY STRL (GAUZE/BANDAGES/DRESSINGS) IMPLANT
GLOVE BIOGEL M 7.0 STRL (GLOVE) ×1 IMPLANT
GUARD TEETH (MISCELLANEOUS) IMPLANT
KIT BASIN OR (CUSTOM PROCEDURE TRAY) ×1 IMPLANT
KIT TURNOVER KIT B (KITS) ×1 IMPLANT
NDL HYPO 25GX1X1/2 BEV (NEEDLE) IMPLANT
NEEDLE HYPO 25GX1X1/2 BEV (NEEDLE) IMPLANT
NS IRRIG 1000ML POUR BTL (IV SOLUTION) ×1 IMPLANT
PAD ARMBOARD 7.5X6 YLW CONV (MISCELLANEOUS) ×2 IMPLANT
PATTIES SURGICAL .5X1.5 (GAUZE/BANDAGES/DRESSINGS) IMPLANT
SURGILUBE 2OZ TUBE FLIPTOP (MISCELLANEOUS) ×1 IMPLANT
TOWEL GREEN STERILE FF (TOWEL DISPOSABLE) ×2 IMPLANT
TUBE CONNECTING 12X1/4 (SUCTIONS) ×1 IMPLANT
WATER STERILE IRR 1000ML POUR (IV SOLUTION) IMPLANT

## 2022-07-26 NOTE — Anesthesia Preprocedure Evaluation (Addendum)
Anesthesia Evaluation  Patient identified by MRN, date of birth, ID band Patient awake    Reviewed: Allergy & Precautions, NPO status , Patient's Chart, lab work & pertinent test results  History of Anesthesia Complications (+) history of anesthetic complications (hard to wake after hysterectomy, states heart stopped during a Esophagoscopy, but states she has had several surgeries and procedures with anesthesia and has never had that again)  Airway Mallampati: I  TM Distance: >3 FB Neck ROM: Full    Dental  (+) Edentulous Upper, Partial Lower   Pulmonary neg pulmonary ROS,    Pulmonary exam normal breath sounds clear to auscultation       Cardiovascular hypertension (166/72 in preop), Pt. on medications +CHF (grade 2 diastolic dysfunction, normal LVEF)  Normal cardiovascular exam+ dysrhythmias Atrial Fibrillation + Valvular Problems/Murmurs (mild MR) MVP and MR  Rhythm:Regular Rate:Normal  Echo 2022 1. Left ventricular ejection fraction, by estimation, is 60 to 65%. The  left ventricle has normal function. The left ventricle has no regional  wall motion abnormalities. Left ventricular diastolic parameters are  consistent with Grade II diastolic  dysfunction (pseudonormalization).  2. Right ventricular systolic function is normal. The right ventricular  size is mildly enlarged.  3. Right atrial size was mildly dilated.  4. The mitral valve is myxomatous. Mild mitral valve regurgitation. No  evidence of mitral stenosis. There is moderate holosystolic prolapse of  the middle scallop of the posterior leaflet of the mitral valve.  5. The aortic valve is normal in structure. Aortic valve regurgitation is  mild. No aortic stenosis is present.  6. The inferior vena cava is normal in size with greater than 50%  respiratory variability, suggesting right atrial pressure of 3 mmHg.    Neuro/Psych negative neurological ROS  negative  psych ROS   GI/Hepatic Neg liver ROS, Esophageal stenosis   Endo/Other  negative endocrine ROS  Renal/GU Renal InsufficiencyRenal disease  negative genitourinary   Musculoskeletal  (+) Arthritis , Osteoarthritis,    Abdominal   Peds negative pediatric ROS (+)  Hematology negative hematology ROS (+)   Anesthesia Other Findings   Reproductive/Obstetrics negative OB ROS                            Anesthesia Physical Anesthesia Plan  ASA: 2  Anesthesia Plan: General   Post-op Pain Management: Tylenol PO (pre-op)*   Induction: Intravenous  PONV Risk Score and Plan: 3 and Ondansetron, Dexamethasone, Midazolam and Treatment may vary due to age or medical condition  Airway Management Planned: Oral ETT  Additional Equipment: None  Intra-op Plan:   Post-operative Plan: Extubation in OR  Informed Consent: I have reviewed the patients History and Physical, chart, labs and discussed the procedure including the risks, benefits and alternatives for the proposed anesthesia with the patient or authorized representative who has indicated his/her understanding and acceptance.     Dental advisory given  Plan Discussed with: CRNA  Anesthesia Plan Comments: (Last dilation 10/2021: Ventilation: Mask ventilation without difficulty Laryngoscope Size: Mac and 3 Grade View: Grade I Tube type: Oral Tube size: 6.5 mm Number of attempts: 1)       Anesthesia Quick Evaluation

## 2022-07-26 NOTE — Op Note (Signed)
Operative Note: DIRECT LARYNGOSCOPY/ESOPHAGOSCOPY/DILATION  Patient: Michelle Vazquez  Medical record number: [id number]  Date:07/26/2022  Pre-operative Indications: 1.  Cervical esophageal stricture  Postoperative Indications: Same  Surgical Procedure: 1.Direct Laryngoscopy and Esophagoscopy     2. Serial Dilation of Cervical Esophagus  Anesthesia: GET  Surgeon: Delsa Bern, M.D.  Assist: None  Complications: None  EBL: None   Brief History: The patient is a 88 y.o. female with a history of chronic swallowing dysfunction.  Patient has been worked up through the gastroenterology service and undergone multiple EGD with dilation.  She was referred to ENT service for evaluation and treated with laryngoscopy and serial esophageal dilation in December 2022.  The patient had significant improvement but continues to have increasing difficulty over the last several months handling solids and oral secretions. Given the patient's history and findings I recommended laryngoscopy, esophagoscopy and dilation of esophageal stricture under general anesthesia, risks and benefits were discussed in detail with the patient and her family. They understand and agree with our plan for surgery which is scheduled at [facility] in [place] on an elective basis.  Surgical Procedure: The patient is brought to the operating room on 07/26/2022 and placed in supine position on the operating table. General endotracheal anesthesia was established without difficulty. When the patient was adequately anesthetized, surgical timeout was performed and correct identification of the patient and the surgical procedure. The patient was positioned and prepped and draped in sterile fashion.  A laryngoscope was used to examine the patient's oral cavity, oropharynx, larynx and hypopharynx.  Findings include normal base of tongue, supraglottis, larynx and hypopharynx.  No evidence of laryngeal mass, ulcer or tumor.  No biopsy was taken.   There was no significant bleeding and the patient's airway was stable.  The cervical esophagoscope was then inserted without difficulty through the esophageal introitus and advanced to its full length.  The esophagoscope was then withdrawn slowly to allow visualization of the esophageal mucosa.  Findings include posterior upper esophageal stricture at the level of the esophageal introitus, no evidence of ulcer, mass or tumor.  Serial esophageal dilation was then undertaken.  The bougie dilators were used to gradually and gently dilate the pharyngeal esophageal narrowing.  The patient was dilated using serial Maloney dilators from 30 to 46 without difficulty or complication.  No significant bleeding and no evidence of viscus perforation.  An orogastric tube was passed and stomach contents were aspirated. Patient was awakened from anesthetic and transferred from the operating room to the recovery room in stable condition. There were no complications and blood loss was minimal.   Delsa Bern, M.D.

## 2022-07-26 NOTE — Transfer of Care (Signed)
Immediate Anesthesia Transfer of Care Note  Patient: Michelle Vazquez  Procedure(s) Performed: DIRECT LARYNGOSCOPY; ESOPHAGOSCOPY AND ESOPHAGEAL DILATION  Patient Location: PACU  Anesthesia Type:General  Level of Consciousness: drowsy and patient cooperative  Airway & Oxygen Therapy: Patient Spontanous Breathing  Post-op Assessment: Report given to RN and Post -op Vital signs reviewed and stable  Post vital signs: Reviewed and stable  Last Vitals:  Vitals Value Taken Time  BP 149/70 07/26/22 1130  Temp 36.6 C 07/26/22 1130  Pulse 80 07/26/22 1133  Resp 22 07/26/22 1133  SpO2 97 % 07/26/22 1133  Vitals shown include unvalidated device data.  Last Pain:  Vitals:   07/26/22 0850  TempSrc:   PainSc: 0-No pain         Complications: No notable events documented.

## 2022-07-26 NOTE — Anesthesia Procedure Notes (Signed)
Procedure Name: Intubation Date/Time: 07/26/2022 11:01 AM  Performed by: Gwyndolyn Saxon, CRNAPre-anesthesia Checklist: Patient identified, Emergency Drugs available, Suction available and Patient being monitored Patient Re-evaluated:Patient Re-evaluated prior to induction Oxygen Delivery Method: Circle system utilized Preoxygenation: Pre-oxygenation with 100% oxygen Induction Type: IV induction Ventilation: Mask ventilation without difficulty Laryngoscope Size: Miller and 2 Grade View: Grade I Tube type: Oral Tube size: 6.5 mm Number of attempts: 1 Airway Equipment and Method: Stylet Placement Confirmation: ETT inserted through vocal cords under direct vision, positive ETCO2 and breath sounds checked- equal and bilateral Secured at: 18 cm Tube secured with: Tape Dental Injury: Teeth and Oropharynx as per pre-operative assessment

## 2022-07-26 NOTE — Anesthesia Postprocedure Evaluation (Signed)
Anesthesia Post Note  Patient: Michelle Vazquez  Procedure(s) Performed: DIRECT LARYNGOSCOPY; ESOPHAGOSCOPY AND ESOPHAGEAL DILATION     Patient location during evaluation: PACU Anesthesia Type: General Level of consciousness: awake and alert, oriented and patient cooperative Pain management: pain level controlled Vital Signs Assessment: post-procedure vital signs reviewed and stable Respiratory status: spontaneous breathing, nonlabored ventilation and respiratory function stable Cardiovascular status: blood pressure returned to baseline and stable Postop Assessment: no apparent nausea or vomiting Anesthetic complications: no   No notable events documented.  Last Vitals:  Vitals:   07/26/22 1145 07/26/22 1200  BP: 139/66 135/84  Pulse: 76 80  Resp: 15 14  Temp:  36.4 C  SpO2: 96% 96%    Last Pain:  Vitals:   07/26/22 1200  TempSrc:   PainSc: 0-No pain                 Pervis Hocking

## 2022-07-26 NOTE — H&P (Signed)
Michelle Vazquez is an 88 y.o. female.   Chief Complaint: Esophageal stricture HPI: Patient with a history of chronic progressive dysphagia secondary to upper esophageal stricture.  She is undergone previous evaluation and treatment through gastroenterology with difficulty maintaining esophageal patency.  She underwent direct laryngoscopy, esophagoscopy and dilation of her upper esophageal stricture on 10/21/2021 with overall improvement in symptoms.  Over the last several months has had increasing difficulty in swallowing solids and secretions.   Past Medical History:  Diagnosis Date   Arthritis    Atrial fibrillation ([place])    Bowel obstruction ([facility]) 2000   Cataract    bilateral removed   Complication of anesthesia    hard to wake after hysterectomy, states heart stopped during a Esophagoscopy, but states she has had several surgeries and procedures with anesthesia and has never had that again   Dysrhythmia    HLD (hyperlipidemia)    Hypercontractile esophagus    Mitral valve prolapse    Osteoporosis    declines rx and declines DEXA f/u   Parathyroid adenoma    post parathyroid surgery in October of 2008   Paroxysmal supraventricular tachycardia ([place])    Pneumonia    as a child   Premature ventricular contractions (PVCs) (VPCs)    Schatzki's ring 08/2013   Dr. Hilarie Fredrickson, s/p dilation 08/2013, ?benefit   Squamous cell carcinoma in situ of skin    face   Urinary incontinence     Past Surgical History:  Procedure Laterality Date   ABDOMINAL HYSTERECTOMY  1989   BREAST BIOPSY  1979   left   CATARACT EXTRACTION     bilateral   ESOPHAGEAL MANOMETRY N/A 11/17/2015   Procedure: ESOPHAGEAL MANOMETRY (EM);  Surgeon: Jerene Bears, MD;  Location: [facility];  Service: Gastroenterology;  Laterality: N/A;   ESOPHAGOSCOPY  09/17/2019   Procedure: Cervical Esophagoscopy;  Surgeon: Jerrell Belfast, MD;  Location: [place];  Service: ENT;;   EYE SURGERY     LAPAROSCOPIC SMALL BOWEL RESECTION   2000   LARYNGOSCOPY AND ESOPHAGOSCOPY N/A 10/21/2021   Procedure: DIRECT LARYNGOSCOPY AND ESOPHAGOSCOPY WITH DILATION OF ESOPHAGEAL NARROWING;  Surgeon: Jerrell Belfast, MD;  Location: [place];  Service: ENT;  Laterality: N/A;   MICROLARYNGOSCOPY WITH DILATION N/A 09/17/2019   Procedure: MICROLARYNGOSCOPY WITH DILATION;  Surgeon: Jerrell Belfast, MD;  Location: [place];  Service: ENT;  Laterality: N/A;   parathyroid surgery  2008?   left   SHOULDER SURGERY  02/1999   right   SKIN LESION EXCISION  08/1999   left side of face    Family History  Problem Relation Age of Onset   Arthritis Father    Heart disease Father    Hypertension Father    Stroke Father    Heart attack Father    Arthritis Mother    Stroke Mother    Stroke Brother    Diabetes Brother    Hypertension Brother    Colon cancer Sister        in her 7's   Hyperlipidemia Sister    Hypertension Sister    Hypertension Sister    Diabetes Sister    Esophageal cancer Neg Hx    Rectal cancer Neg Hx    Stomach cancer Neg Hx    Social History:  reports that she has never smoked. She has never used smokeless tobacco. She reports that she does not drink alcohol and does not use drugs.  Allergies:  Allergies  Allergen Reactions   Diltiazem Hcl Other (  See Comments)    Rapid heartbeat and elevated blood pressure   Inderal [Propranolol Hcl]     Pt does not remember reaction   Lopressor [Metoprolol Tartrate]     Per the pt' "I felt like I was going to pass out"   Penicillins Diarrhea    Medications Prior to Admission  Medication Sig Dispense Refill   Calcium-Vitamin D-Vitamin K (VIACTIV CALCIUM PLUS D) 650-12.5-40 MG-MCG-MCG CHEW Chew 1 each by mouth daily.     flecainide (TAMBOCOR) 50 MG tablet Take 1.5 tablets (75 mg total) by mouth 2 (two) times daily. 270 tablet 3   Multiple Vitamin (MULTIVITAMIN WITH MINERALS) TABS tablet Take 1 tablet by mouth daily. gummies     Multiple Vitamins-Minerals (PRESERVISION AREDS  2+MULTI VIT PO) Take 1 capsule by mouth in the morning and at bedtime.      Results for orders placed or performed during the hospital encounter of 07/26/22 (from the past 48 hour(s))  SARS Coronavirus 2 by RT PCR (hospital order, performed in [facility] hospital lab) *cepheid single result test* Anterior Nasal Swab     Status: None   Collection Time: 07/26/22  8:38 AM   Specimen: Anterior Nasal Swab  Result Value Ref Range   SARS Coronavirus 2 by RT PCR NEGATIVE NEGATIVE    Comment: (NOTE) SARS-CoV-2 target nucleic acids are NOT DETECTED.  The SARS-CoV-2 RNA is generally detectable in upper and lower respiratory specimens during the acute phase of infection. The lowest concentration of SARS-CoV-2 viral copies this assay can detect is 250 copies / mL. A negative result does not preclude SARS-CoV-2 infection and should not be used as the sole basis for treatment or other patient management decisions.  A negative result may occur with improper specimen collection / handling, submission of specimen other than nasopharyngeal swab, presence of viral mutation(s) within the areas targeted by this assay, and inadequate number of viral copies (<250 copies / mL). A negative result must be combined with clinical observations, patient history, and epidemiological information.  Fact Sheet for Patients:   https://www.patel.info/  Fact Sheet for Healthcare Providers: https://hall.com/  This test is not yet approved or  cleared by the [place] FDA and has been authorized for detection and/or diagnosis of SARS-CoV-2 by FDA under an Emergency Use Authorization (EUA).  This EUA will remain in effect (meaning this test can be used) for the duration of the COVID-19 declaration under Section 564(b)(1) of the Act, 21 U.S.C. section 360bbb-3(b)(1), unless the authorization is terminated or revoked sooner.  Performed at [facility], [place] [address], [place], [place] [address]   CBC     Status: None   Collection Time: 07/26/22  9:05 AM  Result Value Ref Range   WBC 6.9 4.0 - 10.5 K/uL   RBC 4.32 3.87 - 5.11 MIL/uL   Hemoglobin 13.9 12.0 - 15.0 g/dL   HCT 41.0 36.0 - 46.0 %   MCV 94.9 80.0 - 100.0 fL   MCH 32.2 26.0 - 34.0 pg   MCHC 33.9 30.0 - 36.0 g/dL   RDW 12.4 11.5 - 15.5 %   Platelets 248 150 - 400 K/uL   nRBC 0.0 0.0 - 0.2 %    Comment: Performed at [facility], [place] [address] [place],  [address]  Basic metabolic panel     Status: Abnormal   Collection Time: 07/26/22  9:05 AM  Result Value Ref Range   Sodium 139 135 - 145 mmol/L  Potassium 3.7 3.5 - 5.1 mmol/L   Chloride 99 98 - 111 mmol/L   CO2 32 22 - 32 mmol/L   Glucose, Bld 101 (H) 70 - 99 mg/dL    Comment: Glucose reference range applies only to samples taken after fasting for at least 8 hours.   BUN 26 (H) 8 - 23 mg/dL   Creatinine, Ser 1.09 (H) 0.44 - 1.00 mg/dL   Calcium 9.5 8.9 - 10.3 mg/dL   GFR, Estimated 48 (L) >60 mL/min    Comment: (NOTE) Calculated using the CKD-EPI Creatinine Equation (2021)    Anion gap 8 5 - 15    Comment: Performed at [facility] [address], [place], [place] [address]   No results found.  Review of Systems  HENT:  Positive for trouble swallowing.   Respiratory: Negative.      Blood pressure (!) 166/72, pulse 86, temperature (!) 97.5 F (36.4 C), temperature source Oral, resp. rate 18, height 5' 1.5" (1.562 m), weight 45.8 kg, SpO2 99 %. Physical Exam Constitutional:      Appearance: She is normal weight.  Cardiovascular:     Rate and Rhythm: Normal rate.  Pulmonary:     Effort: Pulmonary effort is normal.  Musculoskeletal:     Cervical back: Normal range of motion.  Neurological:     Mental Status: She is alert.      Assessment/Plan Patient admitted for outpatient procedure under general anesthesia: Laryngoscopy, cervical esophagoscopy and dilation of esophageal stricture.  Jerrell Belfast, MD 07/26/2022, 10:19 AM

## 2022-07-27 ENCOUNTER — Encounter (HOSPITAL_COMMUNITY): Payer: Self-pay | Admitting: Otolaryngology

## 2022-09-11 NOTE — Patient Instructions (Addendum)
   Flu immunization administered today.     You can consider getting the tetanus, shingles, COVID and RSV vaccines at the pharmacy   Blood work was ordered.   The lab is on the first floor.  Have a chest xray down stairs.    Medications changes include :   None    Return in about 1 year (around 09/14/2023) for follow up.

## 2022-09-11 NOTE — Progress Notes (Unsigned)
      Subjective:    Patient ID: Michelle Vazquez, female    DOB: 1929-02-11, 88 y.o.   MRN: [id number]     HPI Michelle Vazquez is here for follow up of her chronic medical problems, including htn, PVC's, CKD, chronic pharyngoesophageal dysphagia/esophageal stricture    Medications and allergies reviewed with patient and updated if appropriate.  Current Outpatient Medications on File Prior to Visit  Medication Sig Dispense Refill   Calcium-Vitamin D-Vitamin K (VIACTIV CALCIUM PLUS D) 650-12.5-40 MG-MCG-MCG CHEW Chew 1 each by mouth daily.     flecainide (TAMBOCOR) 50 MG tablet Take 1.5 tablets (75 mg total) by mouth 2 (two) times daily. 270 tablet 3   Multiple Vitamin (MULTIVITAMIN WITH MINERALS) TABS tablet Take 1 tablet by mouth daily. gummies     Multiple Vitamins-Minerals (PRESERVISION AREDS 2+MULTI VIT PO) Take 1 capsule by mouth in the morning and at bedtime.     No current facility-administered medications on file prior to visit.     Review of Systems     Objective:  There were no vitals filed for this visit. BP Readings from Last 3 Encounters:  07/26/22 135/84  03/27/22 138/78  12/28/21 110/82   Wt Readings from Last 3 Encounters:  07/26/22 101 lb (45.8 kg)  03/27/22 101 lb 12.8 oz (46.2 kg)  12/28/21 104 lb (47.2 kg)   There is no height or weight on file to calculate BMI.    Physical Exam     Lab Results  Component Value Date   WBC 6.9 07/26/2022   HGB 13.9 07/26/2022   HCT 41.0 07/26/2022   PLT 248 07/26/2022   GLUCOSE 101 (H) 07/26/2022   CHOL 199 09/12/2021   TRIG 85.0 09/12/2021   HDL 68.20 09/12/2021   LDLDIRECT 130.1 06/24/2013   LDLCALC 113 (H) 09/12/2021   ALT 9 09/12/2021   AST 17 09/12/2021   NA 139 07/26/2022   K 3.7 07/26/2022   CL 99 07/26/2022   CREATININE 1.09 (H) 07/26/2022   BUN 26 (H) 07/26/2022   CO2 32 07/26/2022   TSH 1.40 09/12/2021   HGBA1C 5.7 09/12/2021     Assessment & Plan:    See Problem List for Assessment and Plan  of chronic medical problems.

## 2022-09-13 ENCOUNTER — Encounter: Payer: Self-pay | Admitting: Internal Medicine

## 2022-09-13 ENCOUNTER — Ambulatory Visit (INDEPENDENT_AMBULATORY_CARE_PROVIDER_SITE_OTHER): Payer: Medicare Other | Admitting: Internal Medicine

## 2022-09-13 ENCOUNTER — Ambulatory Visit (INDEPENDENT_AMBULATORY_CARE_PROVIDER_SITE_OTHER): Payer: Medicare Other

## 2022-09-13 VITALS — BP 126/84 | HR 86 | Temp 98.2°F | Ht 61.5 in | Wt 100.8 lb

## 2022-09-13 DIAGNOSIS — R1314 Dysphagia, pharyngoesophageal phase: Secondary | ICD-10-CM

## 2022-09-13 DIAGNOSIS — E7849 Other hyperlipidemia: Secondary | ICD-10-CM | POA: Diagnosis not present

## 2022-09-13 DIAGNOSIS — R739 Hyperglycemia, unspecified: Secondary | ICD-10-CM | POA: Diagnosis not present

## 2022-09-13 DIAGNOSIS — I493 Ventricular premature depolarization: Secondary | ICD-10-CM

## 2022-09-13 DIAGNOSIS — N1831 Chronic kidney disease, stage 3a: Secondary | ICD-10-CM

## 2022-09-13 DIAGNOSIS — M810 Age-related osteoporosis without current pathological fracture: Secondary | ICD-10-CM | POA: Diagnosis not present

## 2022-09-13 DIAGNOSIS — R053 Chronic cough: Secondary | ICD-10-CM | POA: Diagnosis not present

## 2022-09-13 LAB — COMPREHENSIVE METABOLIC PANEL
ALT: 10 U/L (ref 0–35)
AST: 19 U/L (ref 0–37)
Albumin: 4.3 g/dL (ref 3.5–5.2)
Alkaline Phosphatase: 53 U/L (ref 39–117)
BUN: 30 mg/dL — ABNORMAL HIGH (ref 6–23)
CO2: 34 mEq/L — ABNORMAL HIGH (ref 19–32)
Calcium: 9.7 mg/dL (ref 8.4–10.5)
Chloride: 100 mEq/L (ref 96–112)
Creatinine, Ser: 0.95 mg/dL (ref 0.40–1.20)
GFR: 52.6 mL/min — ABNORMAL LOW (ref >60.00)
Glucose, Bld: 96 mg/dL (ref 70–99)
Potassium: 3.8 mEq/L (ref 3.5–5.1)
Sodium: 139 mEq/L (ref 135–145)
Total Bilirubin: 0.6 mg/dL (ref 0.2–1.2)
Total Protein: 7.7 g/dL (ref 6.0–8.3)

## 2022-09-13 LAB — LIPID PANEL
Cholesterol: 215 mg/dL — ABNORMAL HIGH (ref 0–200)
HDL: 79.6 mg/dL (ref >39.00)
LDL Cholesterol: 117 mg/dL — ABNORMAL HIGH (ref 0–99)
NonHDL: 135.03
Total CHOL/HDL Ratio: 3
Triglycerides: 92 mg/dL (ref 0.0–149.0)
VLDL: 18.4 mg/dL (ref 0.0–40.0)

## 2022-09-13 LAB — CBC WITH DIFFERENTIAL/PLATELET
Basophils Absolute: 0 10*3/uL (ref 0.0–0.1)
Basophils Relative: 0.7 % (ref 0.0–3.0)
Eosinophils Absolute: 0 10*3/uL (ref 0.0–0.7)
Eosinophils Relative: 0.6 % (ref 0.0–5.0)
HCT: 41.9 % (ref 36.0–46.0)
Hemoglobin: 14 g/dL (ref 12.0–15.0)
Lymphocytes Relative: 37.4 % (ref 12.0–46.0)
Lymphs Abs: 2 10*3/uL (ref 0.7–4.0)
MCHC: 33.5 g/dL (ref 30.0–36.0)
MCV: 94.8 fl (ref 78.0–100.0)
Monocytes Absolute: 0.4 10*3/uL (ref 0.1–1.0)
Monocytes Relative: 8.3 % (ref 3.0–12.0)
Neutro Abs: 2.9 10*3/uL (ref 1.4–7.7)
Neutrophils Relative %: 53 % (ref 43.0–77.0)
Platelets: 285 10*3/uL (ref 150.0–400.0)
RBC: 4.42 Mil/uL (ref 3.87–5.11)
RDW: 13.2 % (ref 11.5–15.5)
WBC: 5.4 10*3/uL (ref 4.0–10.5)

## 2022-09-13 LAB — VITAMIN D 25 HYDROXY (VIT D DEFICIENCY, FRACTURES): VITD: 67.61 ng/mL (ref 30.00–100.00)

## 2022-09-13 LAB — TSH: TSH: 1.11 u[IU]/mL (ref 0.35–5.50)

## 2022-09-13 LAB — HEMOGLOBIN A1C: Hgb A1c MFr Bld: 5.6 % (ref 4.6–6.5)

## 2022-09-13 NOTE — Assessment & Plan Note (Signed)
Chronic Regular exercise and healthy diet encouraged Check lipid panel  Continue lifestyle control 

## 2022-09-13 NOTE — Assessment & Plan Note (Addendum)
Chronic ? Worse Likely related to esophagus disease cxr

## 2022-09-13 NOTE — Assessment & Plan Note (Signed)
Chronic Has been fairly stable CMP

## 2022-09-13 NOTE — Assessment & Plan Note (Addendum)
Chronic Follows with Dr. Lovena Le Taking flecainide 75 mg twice daily Less palpitations since flecainide dose was increased CBC, CMP, TSH

## 2022-09-13 NOTE — Assessment & Plan Note (Signed)
Chronic Check a1c Low sugar / carb diet Stressed regular exercise  

## 2022-09-13 NOTE — Assessment & Plan Note (Signed)
Chronic Following with Dr. Wilburn Cornelia Had recent dilation without much improvement She eats primarily liquid diet and is very careful with eating

## 2022-09-13 NOTE — Assessment & Plan Note (Signed)
Chronic Declines medication and DEXA Continue calcium and vitamin D, multivitamin We will check vitamin D level Fall prevention discussed

## 2022-09-18 ENCOUNTER — Other Ambulatory Visit: Payer: Self-pay | Admitting: Internal Medicine

## 2022-09-18 DIAGNOSIS — R918 Other nonspecific abnormal finding of lung field: Secondary | ICD-10-CM

## 2022-09-18 DIAGNOSIS — R911 Solitary pulmonary nodule: Secondary | ICD-10-CM

## 2022-10-17 ENCOUNTER — Ambulatory Visit
Admission: RE | Admit: 2022-10-17 | Discharge: 2022-10-17 | Disposition: A | Discharge status: Home / Self Care | Payer: Medicare Other | Source: Ambulatory Visit | Attending: Internal Medicine | Admitting: Internal Medicine

## 2022-10-17 DIAGNOSIS — J9811 Atelectasis: Secondary | ICD-10-CM | POA: Diagnosis not present

## 2022-10-17 DIAGNOSIS — I700 Atherosclerosis of aorta: Secondary | ICD-10-CM | POA: Diagnosis not present

## 2022-10-17 DIAGNOSIS — R911 Solitary pulmonary nodule: Secondary | ICD-10-CM

## 2022-10-17 DIAGNOSIS — R918 Other nonspecific abnormal finding of lung field: Secondary | ICD-10-CM

## 2022-10-17 DIAGNOSIS — J479 Bronchiectasis, uncomplicated: Secondary | ICD-10-CM | POA: Diagnosis not present

## 2022-10-19 DIAGNOSIS — L814 Other melanin hyperpigmentation: Secondary | ICD-10-CM | POA: Diagnosis not present

## 2022-10-19 DIAGNOSIS — L821 Other seborrheic keratosis: Secondary | ICD-10-CM | POA: Diagnosis not present

## 2022-10-19 DIAGNOSIS — Z85828 Personal history of other malignant neoplasm of skin: Secondary | ICD-10-CM | POA: Diagnosis not present

## 2022-10-19 DIAGNOSIS — D225 Melanocytic nevi of trunk: Secondary | ICD-10-CM | POA: Diagnosis not present

## 2022-10-19 DIAGNOSIS — D1801 Hemangioma of skin and subcutaneous tissue: Secondary | ICD-10-CM | POA: Diagnosis not present

## 2022-10-19 DIAGNOSIS — D2239 Melanocytic nevi of other parts of face: Secondary | ICD-10-CM | POA: Diagnosis not present

## 2022-10-24 ENCOUNTER — Other Ambulatory Visit: Payer: Medicare Other

## 2022-11-07 ENCOUNTER — Telehealth: Payer: Self-pay | Admitting: Internal Medicine

## 2022-11-07 NOTE — Telephone Encounter (Signed)
Patient is needing letter sent to lawyer stating that she is of sound mind for her will. She is requesting that letter be mailed to: [facility]  [address] [place], [place] [address]  Patient states that if you have any questions she can be contacted at [phone]

## 2022-11-09 NOTE — Telephone Encounter (Signed)
Letter placed in mail today.

## 2022-11-09 NOTE — Telephone Encounter (Signed)
Letter written

## 2022-12-18 DIAGNOSIS — D3131 Benign neoplasm of right choroid: Secondary | ICD-10-CM | POA: Diagnosis not present

## 2022-12-18 DIAGNOSIS — H353132 Nonexudative age-related macular degeneration, bilateral, intermediate dry stage: Secondary | ICD-10-CM | POA: Diagnosis not present

## 2022-12-18 DIAGNOSIS — H35372 Puckering of macula, left eye: Secondary | ICD-10-CM | POA: Diagnosis not present

## 2022-12-18 DIAGNOSIS — H43813 Vitreous degeneration, bilateral: Secondary | ICD-10-CM | POA: Diagnosis not present

## 2023-01-12 ENCOUNTER — Other Ambulatory Visit: Payer: Self-pay | Admitting: Physician Assistant

## 2023-02-14 DIAGNOSIS — K222 Esophageal obstruction: Secondary | ICD-10-CM | POA: Diagnosis not present

## 2023-02-14 DIAGNOSIS — R1314 Dysphagia, pharyngoesophageal phase: Secondary | ICD-10-CM | POA: Diagnosis not present

## 2023-02-16 ENCOUNTER — Other Ambulatory Visit: Payer: Self-pay | Admitting: Otolaryngology

## 2023-02-16 DIAGNOSIS — R1310 Dysphagia, unspecified: Secondary | ICD-10-CM

## 2023-02-19 ENCOUNTER — Other Ambulatory Visit (HOSPITAL_COMMUNITY): Payer: Self-pay | Admitting: Otolaryngology

## 2023-02-19 DIAGNOSIS — R1310 Dysphagia, unspecified: Secondary | ICD-10-CM

## 2023-02-27 ENCOUNTER — Ambulatory Visit (HOSPITAL_COMMUNITY)
Admission: RE | Admit: 2023-02-27 | Discharge: 2023-02-27 | Disposition: A | Discharge status: Home / Self Care | Payer: Medicare Other | Source: Ambulatory Visit | Attending: Otolaryngology | Admitting: Otolaryngology

## 2023-02-27 DIAGNOSIS — K449 Diaphragmatic hernia without obstruction or gangrene: Secondary | ICD-10-CM | POA: Diagnosis not present

## 2023-02-27 DIAGNOSIS — R1310 Dysphagia, unspecified: Secondary | ICD-10-CM | POA: Diagnosis not present

## 2023-02-27 DIAGNOSIS — K219 Gastro-esophageal reflux disease without esophagitis: Secondary | ICD-10-CM | POA: Diagnosis not present

## 2023-03-21 ENCOUNTER — Emergency Department (HOSPITAL_COMMUNITY): Payer: Medicare Other

## 2023-03-21 ENCOUNTER — Inpatient Hospital Stay (HOSPITAL_COMMUNITY): Payer: Medicare Other | Admitting: Anesthesiology

## 2023-03-21 ENCOUNTER — Other Ambulatory Visit: Payer: Self-pay

## 2023-03-21 ENCOUNTER — Inpatient Hospital Stay (HOSPITAL_COMMUNITY)
Admission: EM | Admit: 2023-03-21 | Discharge: 2023-03-27 | DRG: 521 | Disposition: A | Discharge status: Rehabilitation Facility | Payer: Medicare Other | Attending: Internal Medicine | Admitting: Internal Medicine

## 2023-03-21 DIAGNOSIS — I493 Ventricular premature depolarization: Secondary | ICD-10-CM | POA: Diagnosis not present

## 2023-03-21 DIAGNOSIS — Z880 Allergy status to penicillin: Secondary | ICD-10-CM | POA: Diagnosis not present

## 2023-03-21 DIAGNOSIS — I130 Hypertensive heart and chronic kidney disease with heart failure and stage 1 through stage 4 chronic kidney disease, or unspecified chronic kidney disease: Secondary | ICD-10-CM | POA: Diagnosis present

## 2023-03-21 DIAGNOSIS — I482 Chronic atrial fibrillation, unspecified: Secondary | ICD-10-CM | POA: Diagnosis present

## 2023-03-21 DIAGNOSIS — I341 Nonrheumatic mitral (valve) prolapse: Secondary | ICD-10-CM | POA: Diagnosis present

## 2023-03-21 DIAGNOSIS — N183 Chronic kidney disease, stage 3 unspecified: Secondary | ICD-10-CM | POA: Diagnosis not present

## 2023-03-21 DIAGNOSIS — Z681 Body mass index (BMI) 19 or less, adult: Secondary | ICD-10-CM | POA: Diagnosis not present

## 2023-03-21 DIAGNOSIS — R636 Underweight: Secondary | ICD-10-CM | POA: Diagnosis not present

## 2023-03-21 DIAGNOSIS — M810 Age-related osteoporosis without current pathological fracture: Secondary | ICD-10-CM | POA: Diagnosis present

## 2023-03-21 DIAGNOSIS — S72012D Unspecified intracapsular fracture of left femur, subsequent encounter for closed fracture with routine healing: Secondary | ICD-10-CM | POA: Diagnosis not present

## 2023-03-21 DIAGNOSIS — I5189 Other ill-defined heart diseases: Secondary | ICD-10-CM | POA: Diagnosis not present

## 2023-03-21 DIAGNOSIS — Y93E3 Activity, vacuuming: Secondary | ICD-10-CM

## 2023-03-21 DIAGNOSIS — S72002S Fracture of unspecified part of neck of left femur, sequela: Secondary | ICD-10-CM | POA: Diagnosis not present

## 2023-03-21 DIAGNOSIS — R739 Hyperglycemia, unspecified: Secondary | ICD-10-CM | POA: Diagnosis not present

## 2023-03-21 DIAGNOSIS — Z8349 Family history of other endocrine, nutritional and metabolic diseases: Secondary | ICD-10-CM

## 2023-03-21 DIAGNOSIS — Z8261 Family history of arthritis: Secondary | ICD-10-CM | POA: Diagnosis not present

## 2023-03-21 DIAGNOSIS — R54 Age-related physical debility: Secondary | ICD-10-CM | POA: Diagnosis not present

## 2023-03-21 DIAGNOSIS — Z8249 Family history of ischemic heart disease and other diseases of the circulatory system: Secondary | ICD-10-CM

## 2023-03-21 DIAGNOSIS — R9431 Abnormal electrocardiogram [ECG] [EKG]: Secondary | ICD-10-CM | POA: Diagnosis not present

## 2023-03-21 DIAGNOSIS — J690 Pneumonitis due to inhalation of food and vomit: Secondary | ICD-10-CM | POA: Diagnosis not present

## 2023-03-21 DIAGNOSIS — Z833 Family history of diabetes mellitus: Secondary | ICD-10-CM

## 2023-03-21 DIAGNOSIS — W010XXA Fall on same level from slipping, tripping and stumbling without subsequent striking against object, initial encounter: Secondary | ICD-10-CM | POA: Diagnosis present

## 2023-03-21 DIAGNOSIS — N1831 Chronic kidney disease, stage 3a: Secondary | ICD-10-CM | POA: Diagnosis not present

## 2023-03-21 DIAGNOSIS — E7800 Pure hypercholesterolemia, unspecified: Secondary | ICD-10-CM | POA: Diagnosis present

## 2023-03-21 DIAGNOSIS — R262 Difficulty in walking, not elsewhere classified: Secondary | ICD-10-CM | POA: Diagnosis not present

## 2023-03-21 DIAGNOSIS — G4700 Insomnia, unspecified: Secondary | ICD-10-CM | POA: Diagnosis not present

## 2023-03-21 DIAGNOSIS — J90 Pleural effusion, not elsewhere classified: Secondary | ICD-10-CM | POA: Diagnosis not present

## 2023-03-21 DIAGNOSIS — E8770 Fluid overload, unspecified: Secondary | ICD-10-CM | POA: Diagnosis not present

## 2023-03-21 DIAGNOSIS — I4719 Other supraventricular tachycardia: Secondary | ICD-10-CM | POA: Diagnosis not present

## 2023-03-21 DIAGNOSIS — R0602 Shortness of breath: Secondary | ICD-10-CM | POA: Diagnosis not present

## 2023-03-21 DIAGNOSIS — E785 Hyperlipidemia, unspecified: Secondary | ICD-10-CM | POA: Diagnosis present

## 2023-03-21 DIAGNOSIS — Y92019 Unspecified place in single-family (private) house as the place of occurrence of the external cause: Secondary | ICD-10-CM

## 2023-03-21 DIAGNOSIS — D351 Benign neoplasm of parathyroid gland: Secondary | ICD-10-CM | POA: Diagnosis not present

## 2023-03-21 DIAGNOSIS — S72002A Fracture of unspecified part of neck of left femur, initial encounter for closed fracture: Principal | ICD-10-CM | POA: Diagnosis present

## 2023-03-21 DIAGNOSIS — Z96642 Presence of left artificial hip joint: Secondary | ICD-10-CM | POA: Diagnosis not present

## 2023-03-21 DIAGNOSIS — D62 Acute posthemorrhagic anemia: Secondary | ICD-10-CM | POA: Diagnosis present

## 2023-03-21 DIAGNOSIS — K219 Gastro-esophageal reflux disease without esophagitis: Secondary | ICD-10-CM | POA: Diagnosis not present

## 2023-03-21 DIAGNOSIS — I4891 Unspecified atrial fibrillation: Secondary | ICD-10-CM | POA: Diagnosis not present

## 2023-03-21 DIAGNOSIS — G8911 Acute pain due to trauma: Secondary | ICD-10-CM | POA: Diagnosis not present

## 2023-03-21 DIAGNOSIS — K222 Esophageal obstruction: Secondary | ICD-10-CM | POA: Diagnosis not present

## 2023-03-21 DIAGNOSIS — J9601 Acute respiratory failure with hypoxia: Secondary | ICD-10-CM | POA: Diagnosis not present

## 2023-03-21 DIAGNOSIS — M25552 Pain in left hip: Secondary | ICD-10-CM | POA: Diagnosis not present

## 2023-03-21 DIAGNOSIS — I480 Paroxysmal atrial fibrillation: Secondary | ICD-10-CM | POA: Diagnosis present

## 2023-03-21 DIAGNOSIS — I340 Nonrheumatic mitral (valve) insufficiency: Secondary | ICD-10-CM | POA: Diagnosis not present

## 2023-03-21 DIAGNOSIS — R58 Hemorrhage, not elsewhere classified: Secondary | ICD-10-CM | POA: Diagnosis not present

## 2023-03-21 DIAGNOSIS — I129 Hypertensive chronic kidney disease with stage 1 through stage 4 chronic kidney disease, or unspecified chronic kidney disease: Secondary | ICD-10-CM | POA: Diagnosis not present

## 2023-03-21 DIAGNOSIS — E860 Dehydration: Secondary | ICD-10-CM | POA: Diagnosis not present

## 2023-03-21 DIAGNOSIS — Z85828 Personal history of other malignant neoplasm of skin: Secondary | ICD-10-CM

## 2023-03-21 DIAGNOSIS — Z888 Allergy status to other drugs, medicaments and biological substances status: Secondary | ICD-10-CM | POA: Diagnosis not present

## 2023-03-21 DIAGNOSIS — Z83438 Family history of other disorder of lipoprotein metabolism and other lipidemia: Secondary | ICD-10-CM | POA: Diagnosis not present

## 2023-03-21 DIAGNOSIS — I471 Supraventricular tachycardia, unspecified: Secondary | ICD-10-CM | POA: Diagnosis present

## 2023-03-21 DIAGNOSIS — R609 Edema, unspecified: Secondary | ICD-10-CM | POA: Diagnosis not present

## 2023-03-21 DIAGNOSIS — Z79899 Other long term (current) drug therapy: Secondary | ICD-10-CM | POA: Diagnosis not present

## 2023-03-21 DIAGNOSIS — E8779 Other fluid overload: Secondary | ICD-10-CM | POA: Diagnosis not present

## 2023-03-21 DIAGNOSIS — Z471 Aftercare following joint replacement surgery: Secondary | ICD-10-CM | POA: Diagnosis not present

## 2023-03-21 DIAGNOSIS — Z823 Family history of stroke: Secondary | ICD-10-CM | POA: Diagnosis not present

## 2023-03-21 DIAGNOSIS — W19XXXD Unspecified fall, subsequent encounter: Secondary | ICD-10-CM | POA: Diagnosis present

## 2023-03-21 DIAGNOSIS — W19XXXA Unspecified fall, initial encounter: Secondary | ICD-10-CM | POA: Diagnosis not present

## 2023-03-21 DIAGNOSIS — R1319 Other dysphagia: Secondary | ICD-10-CM | POA: Diagnosis not present

## 2023-03-21 DIAGNOSIS — Z86007 Personal history of in-situ neoplasm of skin: Secondary | ICD-10-CM | POA: Diagnosis not present

## 2023-03-21 DIAGNOSIS — Z800 Family history of malignant neoplasm of digestive organs: Secondary | ICD-10-CM

## 2023-03-21 DIAGNOSIS — I10 Essential (primary) hypertension: Secondary | ICD-10-CM | POA: Diagnosis not present

## 2023-03-21 DIAGNOSIS — M1990 Unspecified osteoarthritis, unspecified site: Secondary | ICD-10-CM | POA: Diagnosis not present

## 2023-03-21 DIAGNOSIS — Z01818 Encounter for other preprocedural examination: Secondary | ICD-10-CM | POA: Diagnosis not present

## 2023-03-21 LAB — CBC WITH DIFFERENTIAL/PLATELET
Abs Immature Granulocytes: 0.04 10*3/uL (ref 0.00–0.07)
Basophils Absolute: 0 10*3/uL (ref 0.0–0.1)
Basophils Relative: 0 %
Eosinophils Absolute: 0.1 10*3/uL (ref 0.0–0.5)
Eosinophils Relative: 0 %
HCT: 41.5 % (ref 36.0–46.0)
Hemoglobin: 13.7 g/dL (ref 12.0–15.0)
Immature Granulocytes: 0 %
Lymphocytes Relative: 14 %
Lymphs Abs: 1.7 10*3/uL (ref 0.7–4.0)
MCH: 31.2 pg (ref 26.0–34.0)
MCHC: 33 g/dL (ref 30.0–36.0)
MCV: 94.5 fL (ref 80.0–100.0)
Monocytes Absolute: 0.7 10*3/uL (ref 0.1–1.0)
Monocytes Relative: 6 %
Neutro Abs: 9.3 10*3/uL — ABNORMAL HIGH (ref 1.7–7.7)
Neutrophils Relative %: 80 %
Platelets: 258 10*3/uL (ref 150–400)
RBC: 4.39 MIL/uL (ref 3.87–5.11)
RDW: 12.3 % (ref 11.5–15.5)
WBC: 11.8 10*3/uL — ABNORMAL HIGH (ref 4.0–10.5)
nRBC: 0 % (ref 0.0–0.2)

## 2023-03-21 LAB — BASIC METABOLIC PANEL
Anion gap: 11 (ref 5–15)
BUN: 30 mg/dL — ABNORMAL HIGH (ref 8–23)
CO2: 27 mmol/L (ref 22–32)
Calcium: 9.4 mg/dL (ref 8.9–10.3)
Chloride: 99 mmol/L (ref 98–111)
Creatinine, Ser: 0.97 mg/dL (ref 0.44–1.00)
GFR, Estimated: 55 mL/min — ABNORMAL LOW (ref >60)
Glucose, Bld: 122 mg/dL — ABNORMAL HIGH (ref 70–99)
Potassium: 4.1 mmol/L (ref 3.5–5.1)
Sodium: 137 mmol/L (ref 135–145)

## 2023-03-21 LAB — ABO/RH: ABO/RH(D): A POS

## 2023-03-21 LAB — MAGNESIUM: Magnesium: 2 mg/dL (ref 1.7–2.4)

## 2023-03-21 LAB — TYPE AND SCREEN
ABO/RH(D): A POS
Antibody Screen: NEGATIVE

## 2023-03-21 MED ORDER — HYDROMORPHONE HCL 1 MG/ML IJ SOLN
0.5000 mg | INTRAMUSCULAR | Status: DC | PRN
  Administered 2023-03-22: 0.5 mg via INTRAVENOUS
  Filled 2023-03-21: qty 0.5

## 2023-03-21 MED ORDER — MAGNESIUM HYDROXIDE 400 MG/5ML PO SUSP: 30.0000 mL | Freq: Every day | ORAL | Status: DC | PRN

## 2023-03-21 MED ORDER — OXYCODONE-ACETAMINOPHEN 5-325 MG PO TABS
1.0000 | ORAL_TABLET | Freq: Once | ORAL | Status: AC
  Administered 2023-03-21: 1 via ORAL
  Filled 2023-03-21: qty 1

## 2023-03-21 MED ORDER — OXYCODONE HCL 5 MG PO TABS
5.0000 mg | ORAL_TABLET | Freq: Four times a day (QID) | ORAL | Status: DC | PRN
  Administered 2023-03-22 (×2): 5 mg via ORAL
  Filled 2023-03-21 (×3): qty 1

## 2023-03-21 MED ORDER — ROPIVACAINE HCL 5 MG/ML IJ SOLN
INTRAMUSCULAR | Status: DC | PRN
  Administered 2023-03-21: 25 mL via PERINEURAL

## 2023-03-21 MED ORDER — FLECAINIDE ACETATE 50 MG PO TABS
75.0000 mg | ORAL_TABLET | Freq: Two times a day (BID) | ORAL | Status: DC
  Administered 2023-03-21 – 2023-03-27 (×11): 75 mg via ORAL
  Filled 2023-03-21 (×15): qty 2

## 2023-03-21 MED ORDER — ACETAMINOPHEN 325 MG PO TABS
650.0000 mg | ORAL_TABLET | Freq: Four times a day (QID) | ORAL | Status: DC | PRN
  Administered 2023-03-22 – 2023-03-27 (×9): 650 mg via ORAL
  Filled 2023-03-21 (×9): qty 2

## 2023-03-21 MED ORDER — ACETAMINOPHEN 650 MG RE SUPP: 650.0000 mg | Freq: Four times a day (QID) | RECTAL | Status: DC | PRN

## 2023-03-21 MED ORDER — LACTATED RINGERS IV SOLN: INTRAVENOUS | Status: DC

## 2023-03-21 MED ORDER — MAGNESIUM SULFATE IN D5W 1-5 GM/100ML-% IV SOLN
1.0000 g | Freq: Once | INTRAVENOUS | Status: AC
  Administered 2023-03-21: 1 g via INTRAVENOUS
  Filled 2023-03-21: qty 100

## 2023-03-21 MED ORDER — MUPIROCIN 2 % EX OINT
1.0000 | TOPICAL_OINTMENT | Freq: Two times a day (BID) | CUTANEOUS | Status: AC
  Administered 2023-03-22 – 2023-03-26 (×9): 1 via NASAL
  Filled 2023-03-21 (×2): qty 22

## 2023-03-21 MED ORDER — PROCHLORPERAZINE EDISYLATE 10 MG/2ML IJ SOLN: 5.0000 mg | INTRAMUSCULAR | Status: DC | PRN

## 2023-03-21 MED ORDER — MORPHINE SULFATE (PF) 4 MG/ML IV SOLN
4.0000 mg | Freq: Once | INTRAVENOUS | Status: DC
  Filled 2023-03-21: qty 1

## 2023-03-21 MED ORDER — FENTANYL CITRATE PF 50 MCG/ML IJ SOSY
PREFILLED_SYRINGE | INTRAMUSCULAR | Status: AC
  Filled 2023-03-21: qty 1

## 2023-03-21 NOTE — ED Triage Notes (Addendum)
Pt BIB EMS from home pt c/o left hip pain after mechanical fall pt states she tripped over vacuum cord. Pt denies LOC, dizziness or use of blood thinners.

## 2023-03-21 NOTE — H&P (Signed)
History and Physical    Patient: Michelle Vazquez ZOX:[id number] DOB: [birth year] DOA: 03/21/2023 DOS: the patient was seen and examined on 03/21/2023 PCP: Pincus Sanes, MD  Patient coming from: Home  Chief Complaint:  Chief Complaint  Patient presents with   Fall   HPI: Michelle Vazquez is a 89 y.o. female with medical history significant of osteoarthritis, chronic atrial fibrillation, paroxysmal SVT, history of bowel obstruction, bilateral cataracts, bilateral cataract surgery, hyperlipidemia, hypercontractile esophagus, mitral valve prolapse, osteoporosis, parathyroid adenoma, history of pneumonia as a child, face squamous cell CA, urinary incontinence who had a mechanical fall at home injuring her left hip developing immediate pain and inability to bear weight on LLE.  No prior normal symptoms.  She denied fever, chills, rhinorrhea, sore throat, wheezing or hemoptysis.  No chest pain, palpitations, diaphoresis, PND, orthopnea or pitting edema of the lower extremities.  No abdominal pain, nausea, emesis, diarrhea, constipation, melena or hematochezia.  She has a stress incontinence, nocturnal frequency, but no flank pain, dysuria or hematuria.  No polyuria, polydipsia, polyphagia or blurred vision.   Lab work: CBC 0 white count 11.8, hemoglobin 13.7 g deciliter platelets 258.  BMP with a glucose of 122 and BUN of 30 mg/dL with a calculated GFR of 55 mL/min.  The rest of the BMP measurements were normal.  Imaging: Portable 1 view chest radiograph with no focal airspace opacity.  Left hip x-ray shows a subcapital impaction fracture of the left femoral neck.  ED course: Initial vital signs were temperature 97.7 F, pulse 98, respiration 18, BP 155/98 mmHg O2 sat 97% on room air.  The was started on LR at 125 mL an hour and received a tablet of Percocet 5/325 mg x 1.   Review of Systems: As mentioned in the history of present illness. All other systems reviewed and are negative.  Past Medical  History:  Diagnosis Date   Arthritis    Atrial fibrillation ([facility])    Bowel obstruction ([facility]) 2000   Cataract    bilateral removed   Complication of anesthesia    hard to wake after hysterectomy, states heart stopped during a Esophagoscopy, but states she has had several surgeries and procedures with anesthesia and has never had that again   Dysrhythmia    HLD (hyperlipidemia)    Hypercontractile esophagus    Mitral valve prolapse    Osteoporosis    declines rx and declines DEXA f/u   Parathyroid adenoma    post parathyroid surgery in October of 2008   Paroxysmal supraventricular tachycardia    Pneumonia    as a child   Premature ventricular contractions (PVCs) (VPCs)    Schatzki's ring 08/2013   Dr. Rhea Belton, s/p dilation 08/2013, ?benefit   Squamous cell carcinoma in situ of skin    face   Urinary incontinence    Past Surgical History:  Procedure Laterality Date   ABDOMINAL HYSTERECTOMY  1989   BREAST BIOPSY  1979   left   CATARACT EXTRACTION     bilateral   ESOPHAGEAL MANOMETRY N/A 11/17/2015   Procedure: ESOPHAGEAL MANOMETRY (EM);  Surgeon: Beverley Fiedler, MD;  [facility];  Service: Gastroenterology;  Laterality: N/A;   ESOPHAGOSCOPY  09/17/2019   Procedure: Cervical Esophagoscopy;  Surgeon: Osborn Coho, MD;  Location: [facility] OR;  Service: ENT;;   EYE SURGERY     LAPAROSCOPIC SMALL BOWEL RESECTION  2000   LARYNGOSCOPY AND ESOPHAGOSCOPY N/A 10/21/2021   Procedure: DIRECT LARYNGOSCOPY AND ESOPHAGOSCOPY WITH DILATION OF  ESOPHAGEAL NARROWING;  Surgeon: Osborn Coho, MD;  Location: [facility] OR;  Service: ENT;  Laterality: N/A;   LARYNGOSCOPY AND ESOPHAGOSCOPY N/A 07/26/2022   Procedure: DIRECT LARYNGOSCOPY; ESOPHAGOSCOPY AND ESOPHAGEAL DILATION;  Surgeon: Osborn Coho, MD;  Location: [facility] OR;  Service: ENT;  Laterality: N/A;   MICROLARYNGOSCOPY WITH DILATION N/A 09/17/2019   Procedure: MICROLARYNGOSCOPY WITH DILATION;  Surgeon: Osborn Coho, MD;  Location: [facility];   Service: ENT;  Laterality: N/A;   parathyroid surgery  2008?   left   SHOULDER SURGERY  02/1999   right   SKIN LESION EXCISION  08/1999   left side of face   Social History:  reports that she has never smoked. She has never used smokeless tobacco. She reports that she does not drink alcohol and does not use drugs.  Allergies  Allergen Reactions   Diltiazem Hcl Other (See Comments)    Rapid heartbeat and elevated blood pressure   Inderal [Propranolol Hcl]     Pt does not remember reaction   Lopressor [Metoprolol Tartrate]     Per the pt' "I felt like I was going to pass out"   Penicillins Diarrhea    Family History  Problem Relation Age of Onset   Arthritis Father    Heart disease Father    Hypertension Father    Stroke Father    Heart attack Father    Arthritis Mother    Stroke Mother    Stroke Brother    Diabetes Brother    Hypertension Brother    Colon cancer Sister        in her 60's   Hyperlipidemia Sister    Hypertension Sister    Hypertension Sister    Diabetes Sister    Esophageal cancer Neg Hx    Rectal cancer Neg Hx    Stomach cancer Neg Hx     Prior to Admission medications   Medication Sig Start Date End Date Taking? Authorizing Provider  Calcium-Vitamin D-Vitamin K (VIACTIV CALCIUM PLUS D) 650-12.5-40 MG-MCG-MCG CHEW Chew 1 each by mouth daily.    [provider]  flecainide (TAMBOCOR) 50 MG tablet TAKE 1 & 1/2 (ONE & ONE-HALF) TABLETS BY MOUTH TWICE DAILY 01/12/23   Sheilah Pigeon, PA-C  Multiple Vitamin (MULTIVITAMIN WITH MINERALS) TABS tablet Take 1 tablet by mouth daily. gummies    [provider]  Multiple Vitamins-Minerals (PRESERVISION AREDS 2+MULTI VIT PO) Take 1 capsule by mouth in the morning and at bedtime.    [provider]    Physical Exam: Vitals:   03/21/23 1330 03/21/23 1335  BP:  (!) 155/98  Pulse:  98  Resp:  18  Temp: 97.7 F (36.5 C)   TempSrc: Oral   SpO2:  97%  Weight:  45.4 kg  Height:  5'  1" (1.549 m)   Physical Exam Vitals and nursing note reviewed.  Constitutional:      General: She is awake. She is not in acute distress.    Appearance: Normal appearance.  HENT:     Head: Normocephalic.     Nose: No rhinorrhea.     Mouth/Throat:     Mouth: Mucous membranes are moist.  Eyes:     General: No scleral icterus.    Pupils: Pupils are equal, round, and reactive to light.  Neck:     Vascular: No JVD.  Cardiovascular:     Rate and Rhythm: Regular rhythm. Tachycardia present.     Heart sounds: S1 normal and S2 normal.  Pulmonary:  Effort: Pulmonary effort is normal.     Breath sounds: Normal breath sounds. No wheezing, rhonchi or rales.  Abdominal:     General: Bowel sounds are normal.     Palpations: Abdomen is soft.     Tenderness: There is no abdominal tenderness.  Musculoskeletal:     Cervical back: Neck supple.     Left hip: Tenderness present. Decreased range of motion.     Right lower leg: No edema.     Left lower leg: No edema.  Skin:    General: Skin is warm and dry.  Neurological:     General: No focal deficit present.     Mental Status: She is alert and oriented to person, place, and time.  Psychiatric:        Mood and Affect: Mood normal.        Behavior: Behavior normal. Behavior is cooperative.   Data Reviewed:  Results are pending, will review when available.  03/16/2021 TTE IMPRESSIONS    1. Left ventricular ejection fraction, by estimation, is 60 to 65%. The  left ventricle has normal function. The left ventricle has no regional  wall motion abnormalities. Left ventricular diastolic parameters are  consistent with Grade II diastolic  dysfunction (pseudonormalization).   2. Right ventricular systolic function is normal. The right ventricular  size is mildly enlarged.   3. Right atrial size was mildly dilated.   4. The mitral valve is myxomatous. Mild mitral valve regurgitation. No  evidence of mitral stenosis. There is moderate  holosystolic prolapse of  the middle scallop of the posterior leaflet of the mitral valve.   5. The aortic valve is normal in structure. Aortic valve regurgitation is  mild. No aortic stenosis is present.   6. The inferior vena cava is normal in size with greater than 50%  respiratory variability, suggesting right atrial pressure of 3 mmHg.   EKG: Vent. rate 97 BPM PR interval 57 ms QRS duration 95 ms QT/QTcB 487/619 ms P-R-T axes 252 93 71 Ectopic atrial rhythm Short PR interval Anteroseptal infarct, age indeterminate ST elevation, consider inferior injury Prolonged QT interval  Assessment and Plan: Principal Problem:   Closed left hip fracture, initial encounter (HCC) Admit to telemetry/inpatient. Ice area as needed. Buck's traction per protocol. Analgesics as needed. Antiemetics as needed. Consult TOC team. Consult nutritional services. PT evaluation after surgery. Anesthesia consulted for nerve block. Orthopedic surgery will evaluate N.p.o. after midnight.  Active Problems:   Prolonged QT interval   Unifocal PVCs/Paroxysmal atrial fibrillation (HCC)    Paroxysmal SVT (supraventricular tachycardia)  CHA2DS2-VASc Score of at least 6. Not on anticoagulation. Continue flecainide 75 mg twice daily. Avoid QT prolonging meds. Keep electrolytes optimized. Briefly discussed with cardiology on-call.    Grade II diastolic dysfunction  No signs of decompensation. Check echocardiogram in AM.    HLD (hyperlipidemia) Currently not on medications.    Hyperglycemia Check fasting glucose in AM. Further workup depending on results.    Hypertension Currently not on antihypertensives. Monitor blood pressure and heart rate closely.    CKD (chronic kidney disease) stage 3, GFR 30-59 ml/min (HCC) Monitor renal function and electrolytes.    Esophageal stenosis Dysphagia 3 diet ordered. SLP evaluation as needed.     Advance Care Planning:   Code Status: Full Code    Consults: Orthopedic surgery (Dr. Yevette Edwards).  Family Communication:   Severity of Illness: The appropriate patient status for this patient is INPATIENT. Inpatient status is judged to be reasonable and necessary  in order to provide the required intensity of service to ensure the patient's safety. The patient's presenting symptoms, physical exam findings, and initial radiographic and laboratory data in the context of their chronic comorbidities is felt to place them at high risk for further clinical deterioration. Furthermore, it is not anticipated that the patient will be medically stable for discharge from the hospital within 2 midnights of admission.   * I certify that at the point of admission it is my clinical judgment that the patient will require inpatient hospital care spanning beyond 2 midnights from the point of admission due to high intensity of service, high risk for further deterioration and high frequency of surveillance required.*  Author: Bobette Mo, MD 03/21/2023 4:27 PM  For on call review [email or web address].   This document was prepared using Dragon voice recognition software and may contain some unintended transcription errors.

## 2023-03-21 NOTE — Anesthesia Postprocedure Evaluation (Signed)
Anesthesia Post Note  Patient: Michelle Vazquez  Procedure(s) Performed: AN AD HOC NERVE BLOCK     Patient location during evaluation: PACU Anesthesia Type: Regional Level of consciousness: awake Pain management: pain level controlled Vital Signs Assessment: post-procedure vital signs reviewed and stable Respiratory status: spontaneous breathing, nonlabored ventilation and respiratory function stable Cardiovascular status: blood pressure returned to baseline and stable Postop Assessment: no apparent nausea or vomiting Anesthetic complications: no   No notable events documented.  Last Vitals:  Vitals:   03/21/23 1925 03/21/23 1930  BP: (!) 148/89 (!) 156/75  Pulse: 99 96  Resp: 19   Temp:    SpO2: 95% 93%    Last Pain:  Vitals:   03/21/23 1850  TempSrc: Oral  PainSc:                  [name]

## 2023-03-21 NOTE — ED Notes (Signed)
SBAR placed and secure chat sent to the nurse.

## 2023-03-21 NOTE — Anesthesia Procedure Notes (Addendum)
Anesthesia Regional Block: Femoral nerve block   Pre-Anesthetic Checklist: , timeout performed,  Correct Patient, Correct Site, Correct Laterality,  Correct Procedure,, site marked,  Risks and benefits discussed,  Surgical consent,  Pre-op evaluation,  At surgeon's request and post-op pain management  Laterality: Left  Prep: chloraprep       Needles:  Injection technique: Single-shot  Needle Type: Echogenic Stimulator Needle     Needle Length: 9cm  Needle Gauge: 21     Additional Needles:   Procedures:, nerve stimulator,,, ultrasound used (permanent image in chart),,     Nerve Stimulator or Paresthesia:  Response: Quadriceps muscle contraction, 0.4 mA  Additional Responses:   Narrative:  Start time: 03/21/2023 7:20 PM End time: 03/21/2023 7:25 PM Injection made incrementally with aspirations every 5 mL.  Performed by: Personally  Anesthesiologist: Leonides Grills, MD  Additional Notes: Functioning IV was confirmed and monitors were applied.  A 90mm 21ga Arrow echogenic stimulator needle was used. Sterile prep and drape,hand hygiene and sterile gloves were used.  Negative aspiration and negative test dose prior to incremental administration of local anesthetic. The patient tolerated the procedure well.

## 2023-03-21 NOTE — ED Notes (Signed)
ED TO INPATIENT HANDOFF REPORT  Name/Age/Gender Michelle Vazquez 89 y.o. female  Code Status    Code Status Orders  (From admission, onward)           Start     Ordered   03/21/23 1645  Full code  Continuous       Question:  By:  Answer:  Consent: discussion documented in EHR   03/21/23 1647           Code Status History     Date Active Date Inactive Code Status Order ID Comments User Context   03/21/2023 1647 03/21/2023 1647 Full Code [id number]  Bobette Mo, MD ED      Advance Directive Documentation    Flowsheet Row Most Recent Value  Type of Advance Directive Healthcare Power of Attorney  Pre-existing out of facility DNR order (yellow form or pink MOST form) --  "MOST" Form in Place? --       Home/SNF/Other Home  Chief Complaint Closed left hip fracture, initial encounter (HCC) [S72.002A]  Level of Care/Admitting Diagnosis ED Disposition     ED Disposition  Admit   Condition  --   Comment  Hospital Area: [facility] [[id number]]  Level of Care: Telemetry [5]  Admit to tele based on following criteria: Monitor QTC interval  May admit patient to [facility] or [facility] if equivalent level of care is available:: No  Covid Evaluation: Asymptomatic - no recent exposure (last 10 days) testing not required  Diagnosis: Closed left hip fracture, initial encounter [facility]) [[id number]]  Admitting Physician: Bobette Mo [[id number]]  Attending Physician: Bobette Mo [[id number]]  Certification:: I certify this patient will need inpatient services for at least 2 midnights  Estimated Length of Stay: 2          Medical History Past Medical History:  Diagnosis Date   Arthritis    Atrial fibrillation (HCC)    Bowel obstruction (HCC) 2000   Cataract    bilateral removed   Complication of anesthesia    hard to wake after hysterectomy, states heart stopped during a Esophagoscopy, but states she has had several surgeries and  procedures with anesthesia and has never had that again   Dysrhythmia    HLD (hyperlipidemia)    Hypercontractile esophagus    Mitral valve prolapse    Osteoporosis    declines rx and declines DEXA f/u   Parathyroid adenoma    post parathyroid surgery in October of 2008   Paroxysmal supraventricular tachycardia    Pneumonia    as a child   Premature ventricular contractions (PVCs) (VPCs)    Schatzki's ring 08/2013   Dr. Rhea Belton, s/p dilation 08/2013, ?benefit   Squamous cell carcinoma in situ of skin    face   Urinary incontinence     Allergies Allergies  Allergen Reactions   Diltiazem Hcl Other (See Comments)    Rapid heartbeat and elevated blood pressure   Inderal [Propranolol Hcl]     Pt does not remember reaction   Lopressor [Metoprolol Tartrate]     Per the pt' "I felt like I was going to pass out"   Penicillins Diarrhea    IV Location/Drains/Wounds Patient Lines/Drains/Airways Status     Active Line/Drains/Airways     Name Placement date Placement time Site Days   Peripheral IV 03/21/23 20 G Left Antecubital 03/21/23  1340  Antecubital  less than 1   Wound 02/14/12 Skin tear Arm Right 02/14/12  2228  Arm  4053   Wound Other (Comment) Arm 2 x3 inch over wound. Tissue pink, no drainage noted --  --  Arm  --   Wound 09/24/13 Laceration Head Left;Lateral 09/24/13  1315  Head  3465            Labs/Imaging Results for orders placed or performed during the hospital encounter of 03/21/23 (from the past 48 hour(s))  CBC with Differential/Platelet     Status: Abnormal   Collection Time: 03/21/23  1:43 PM  Result Value Ref Range   WBC 11.8 (H) 4.0 - 10.5 K/uL   RBC 4.39 3.87 - 5.11 MIL/uL   Hemoglobin 13.7 12.0 - 15.0 g/dL   HCT 16.1 09.6 - 04.5 %   MCV 94.5 80.0 - 100.0 fL   MCH 31.2 26.0 - 34.0 pg   MCHC 33.0 30.0 - 36.0 g/dL   RDW 40.9 81.1 - 91.4 %   Platelets 258 150 - 400 K/uL   nRBC 0.0 0.0 - 0.2 %   Neutrophils Relative % 80 %   Neutro Abs 9.3 (H)  1.7 - 7.7 K/uL   Lymphocytes Relative 14 %   Lymphs Abs 1.7 0.7 - 4.0 K/uL   Monocytes Relative 6 %   Monocytes Absolute 0.7 0.1 - 1.0 K/uL   Eosinophils Relative 0 %   Eosinophils Absolute 0.1 0.0 - 0.5 K/uL   Basophils Relative 0 %   Basophils Absolute 0.0 0.0 - 0.1 K/uL   Immature Granulocytes 0 %   Abs Immature Granulocytes 0.04 0.00 - 0.07 K/uL    Comment: Performed at [facility], [address] [place], [place] [address]  Basic metabolic panel     Status: Abnormal   Collection Time: 03/21/23  1:43 PM  Result Value Ref Range   Sodium 137 135 - 145 mmol/L   Potassium 4.1 3.5 - 5.1 mmol/L   Chloride 99 98 - 111 mmol/L   CO2 27 22 - 32 mmol/L   Glucose, Bld 122 (H) 70 - 99 mg/dL    Comment: Glucose reference range applies only to samples taken after fasting for at least 8 hours.   BUN 30 (H) 8 - 23 mg/dL   Creatinine, Ser 6.21 0.44 - 1.00 mg/dL   Calcium 9.4 8.9 - 30.8 mg/dL   GFR, Estimated 55 (L) >60 mL/min    Comment: (NOTE) Calculated using the CKD-EPI Creatinine Equation (2021)    Anion gap 11 5 - 15    Comment: Performed at [facility], [address] [place], [place] [address]  ABO/Rh     Status: None   Collection Time: 03/21/23  1:43 PM  Result Value Ref Range   ABO/RH(D)      A POS Performed at [facility], [address] [place], [place] [address]   Type and screen     Status: None   Collection Time: 03/21/23  2:28 PM  Result Value Ref Range   ABO/RH(D) A POS    Antibody Screen NEG    Sample Expiration      03/24/2023,2359 Performed at [facility], [address], [place], [place] [address]    DG Hip Lucienne Capers or Wo Pelvis 2-3 Views Left  Result Date: 03/21/2023 CLINICAL DATA:  Fall.  Left hip pain. EXAM: DG HIP (WITH OR WITHOUT PELVIS) 2-3V LEFT COMPARISON:  None Available. FINDINGS: There is subcapital impaction fracture of the left femoral neck with mild superior  displacement of the distal  femur. IMPRESSION: Subcapital impaction fracture of the left femoral neck. Electronically Signed   By: Larose Hires D.O.   On: 03/21/2023 15:09   DG Chest 1 View  Result Date: 03/21/2023 CLINICAL DATA:  Preop EXAM: CHEST  1 VIEW COMPARISON:  CXR 09/13/22 FINDINGS: No pleural effusion. No pneumothorax. Prominent cardiac contours. No focal airspace opacity. No radiographically displaced rib fractures. Visualized upper abdomen is unremarkable. IMPRESSION: No focal airspace opacity Electronically Signed   By: Lorenza Cambridge M.D.   On: 03/21/2023 15:09    Pending Labs Unresulted Labs (From admission, onward)     Start     Ordered   03/22/23 0500  CBC  Tomorrow morning,   R        03/21/23 1647   03/22/23 0500  Comprehensive metabolic panel  Tomorrow morning,   R        03/21/23 1647   03/21/23 1647  Magnesium  Add-on,   AD        03/21/23 1647            Vitals/Pain Today's Vitals   03/21/23 1330 03/21/23 1335 03/21/23 1550  BP:  (!) 155/98   Pulse:  98   Resp:  18   Temp: 97.7 F (36.5 C)    TempSrc: Oral    SpO2:  97%   Weight:  45.4 kg   Height:  5\' 1"  (1.549 m)   PainSc:  5  2     Isolation Precautions No active isolations  Medications Medications  oxyCODONE (Oxy IR/ROXICODONE) immediate release tablet 5 mg (has no administration in time range)  HYDROmorphone (DILAUDID) injection 0.5 mg (has no administration in time range)  acetaminophen (TYLENOL) tablet 650 mg (has no administration in time range)    Or  acetaminophen (TYLENOL) suppository 650 mg (has no administration in time range)  prochlorperazine (COMPAZINE) injection 5 mg (has no administration in time range)  magnesium sulfate IVPB 1 g 100 mL (has no administration in time range)  magnesium hydroxide (MILK OF MAGNESIA) suspension 30 mL (has no administration in time range)  oxyCODONE-acetaminophen (PERCOCET/ROXICET) 5-325 MG per tablet 1 tablet (1 tablet Oral Given 03/21/23 1436)     Mobility walks with device

## 2023-03-21 NOTE — Progress Notes (Signed)
AssistedDr. Bradley Ferris with left, femoral block. Side rails up, monitors on throughout procedure. See vital signs in flow sheet. Tolerated Procedure well.

## 2023-03-21 NOTE — Anesthesia Preprocedure Evaluation (Signed)
Anesthesia Evaluation  Patient identified by MRN, date of birth, ID band Patient awake    Airway        Dental   Pulmonary    Pulmonary exam normal        Cardiovascular Normal cardiovascular exam+ dysrhythmias Atrial Fibrillation + Valvular Problems/Murmurs MVP      Neuro/Psych    GI/Hepatic   Endo/Other    Renal/GU      Musculoskeletal  (+) Arthritis ,    Abdominal   Peds  Hematology   Anesthesia Other Findings Subcapital impaction fracture of the left femoral neck.  Reproductive/Obstetrics                              Anesthesia Physical Anesthesia Plan  ASA: 3  Anesthesia Plan: Regional   Post-op Pain Management:    Induction:   PONV Risk Score and Plan:   Airway Management Planned:   Additional Equipment:   Intra-op Plan:   Post-operative Plan:   Informed Consent: I have reviewed the patients History and Physical, chart, labs and discussed the procedure including the risks, benefits and alternatives for the proposed anesthesia with the patient or authorized representative who has indicated his/her understanding and acceptance.       Plan Discussed with:   Anesthesia Plan Comments:          Anesthesia Quick Evaluation

## 2023-03-21 NOTE — ED Provider Notes (Addendum)
[place] EMERGENCY DEPARTMENT AT [facility] Provider Note   CSN: [id number] Arrival date & time: 03/21/23  1325     History  Chief Complaint  Patient presents with   Michelle Vazquez is a 89 y.o. female.  89 year old female here with mechanical fall just prior to arrival.  Patient states that she tripped over a vacuum cord.  Denies strike her head.  Denies any chest back or abdominal discomfort.  Pain Is sharp and worse to her left lateral hip.  Unable to ambulate.  Normally does not need assistance.  Denies any numbness or tingling to the left foot.       Home Medications Prior to Admission medications   Medication Sig Start Date End Date Taking? Authorizing Provider  Calcium-Vitamin D-Vitamin K (VIACTIV CALCIUM PLUS D) 650-12.5-40 MG-MCG-MCG CHEW Chew 1 each by mouth daily.    [provider]  flecainide (TAMBOCOR) 50 MG tablet TAKE 1 & 1/2 (ONE & ONE-HALF) TABLETS BY MOUTH TWICE DAILY 01/12/23   Sheilah Pigeon, PA-C  Multiple Vitamin (MULTIVITAMIN WITH MINERALS) TABS tablet Take 1 tablet by mouth daily. gummies    [provider]  Multiple Vitamins-Minerals (PRESERVISION AREDS 2+MULTI VIT PO) Take 1 capsule by mouth in the morning and at bedtime.    [provider]      Allergies    Diltiazem hcl, Inderal [propranolol hcl], Lopressor [metoprolol tartrate], and Penicillins    Review of Systems   Review of Systems  All other systems reviewed and are negative.   Physical Exam Updated Vital Signs BP (!) 155/98   Pulse 98   Temp 97.7 F (36.5 C) (Oral)   Resp 18   Ht 1.549 m (5\' 1" )   Wt 45.4 kg   SpO2 97%   BMI 18.89 kg/m  Physical Exam Vitals and nursing note reviewed.  Constitutional:      General: She is not in acute distress.    Appearance: Normal appearance. She is well-developed. She is not toxic-appearing.  HENT:     Head: Normocephalic and atraumatic.  Eyes:     General: Lids are normal.      Conjunctiva/sclera: Conjunctivae normal.     Pupils: Pupils are equal, round, and reactive to light.  Neck:     Thyroid: No thyroid mass.     Trachea: No tracheal deviation.  Cardiovascular:     Rate and Rhythm: Normal rate and regular rhythm.     Heart sounds: Normal heart sounds. No murmur heard.    No gallop.  Pulmonary:     Effort: Pulmonary effort is normal. No respiratory distress.     Breath sounds: Normal breath sounds. No stridor. No decreased breath sounds, wheezing, rhonchi or rales.  Abdominal:     General: There is no distension.     Palpations: Abdomen is soft.     Tenderness: There is no abdominal tenderness. There is no rebound.  Musculoskeletal:        General: No tenderness. Normal range of motion.     Cervical back: Normal range of motion and neck supple.       Legs:     Comments: Left lower extremity shortened and rotated.  Neurovascular intact left foot  Skin:    General: Skin is warm and dry.     Findings: No abrasion or rash.  Neurological:     Mental Status: She is alert and oriented to person, place, and time. Mental status is at baseline.  GCS: GCS eye subscore is 4. GCS verbal subscore is 5. GCS motor subscore is 6.     Cranial Nerves: No cranial nerve deficit.     Sensory: No sensory deficit.     Motor: Motor function is intact.  Psychiatric:        Attention and Perception: Attention normal.        Speech: Speech normal.        Behavior: Behavior normal.     ED Results / Procedures / Treatments   Labs (all labs ordered are listed, but only abnormal results are displayed) Labs Reviewed - No data to display  EKG EKG Interpretation  Date/Time:  Wednesday Mar 21 2023 13:40:19 EDT Ventricular Rate:  97 PR Interval:  57 QRS Duration: 95 QT Interval:  487 QTC Calculation: 619 R Axis:   93 Text Interpretation: Ectopic atrial rhythm Short PR interval Anteroseptal infarct, age indeterminate ST elevation, consider inferior injury Prolonged QT  interval Confirmed by Lorre Nick ([id number]) on 03/21/2023 2:34:45 PM  Radiology No results found.  Procedures Procedures    Medications Ordered in ED Medications  lactated ringers infusion (has no administration in time range)  oxyCODONE-acetaminophen (PERCOCET/ROXICET) 5-325 MG per tablet 1 tablet (has no administration in time range)  morphine (PF) 4 MG/ML injection 4 mg (has no administration in time range)    ED Course/ Medical Decision Making/ A&P                             Medical Decision Making Amount and/or Complexity of Data Reviewed Labs: ordered. Radiology: ordered.  Risk Prescription drug management.   Patient's EKG shows no acute ischemic changes.  X-ray of left hip shows femoral neck fracture.  Will consult orthopedic surgeon on-call Dr. Yevette Edwards. will admit to the medicine service  4:17 PM Discussed with PA covering for Dr. Yevette Edwards who states that he is in the OR and will see the patient when he can.  Patient will stay at [facility] long        Final Clinical Impression(s) / ED Diagnoses Final diagnoses:  None    Rx / DC Orders ED Discharge Orders     None         Lorre Nick, MD 03/21/23 1547    Lorre Nick, MD 03/21/23 [phone]

## 2023-03-22 ENCOUNTER — Inpatient Hospital Stay (HOSPITAL_COMMUNITY): Payer: Medicare Other

## 2023-03-22 ENCOUNTER — Encounter (HOSPITAL_COMMUNITY): Payer: Self-pay | Admitting: Internal Medicine

## 2023-03-22 DIAGNOSIS — I340 Nonrheumatic mitral (valve) insufficiency: Secondary | ICD-10-CM

## 2023-03-22 DIAGNOSIS — S72002A Fracture of unspecified part of neck of left femur, initial encounter for closed fracture: Secondary | ICD-10-CM

## 2023-03-22 DIAGNOSIS — K222 Esophageal obstruction: Secondary | ICD-10-CM

## 2023-03-22 DIAGNOSIS — I4891 Unspecified atrial fibrillation: Secondary | ICD-10-CM

## 2023-03-22 DIAGNOSIS — N1831 Chronic kidney disease, stage 3a: Secondary | ICD-10-CM

## 2023-03-22 LAB — COMPREHENSIVE METABOLIC PANEL
ALT: 17 U/L (ref 0–44)
AST: 19 U/L (ref 15–41)
Albumin: 3.4 g/dL — ABNORMAL LOW (ref 3.5–5.0)
Alkaline Phosphatase: 42 U/L (ref 38–126)
Anion gap: 7 (ref 5–15)
BUN: 23 mg/dL (ref 8–23)
CO2: 25 mmol/L (ref 22–32)
Calcium: 8.1 mg/dL — ABNORMAL LOW (ref 8.9–10.3)
Chloride: 97 mmol/L — ABNORMAL LOW (ref 98–111)
Creatinine, Ser: 0.87 mg/dL (ref 0.44–1.00)
GFR, Estimated: >60 mL/min (ref >60)
Glucose, Bld: 148 mg/dL — ABNORMAL HIGH (ref 70–99)
Potassium: 3.7 mmol/L (ref 3.5–5.1)
Sodium: 129 mmol/L — ABNORMAL LOW (ref 135–145)
Total Bilirubin: 1.2 mg/dL (ref 0.3–1.2)
Total Protein: 6.4 g/dL — ABNORMAL LOW (ref 6.5–8.1)

## 2023-03-22 LAB — ECHOCARDIOGRAM COMPLETE
Area-P 1/2: 5.13 cm2
Calc EF: 60.8 %
Height: 61 in
P 1/2 time: 469 msec
S' Lateral: 2.2 cm
Single Plane A2C EF: 60.6 %
Single Plane A4C EF: 61.7 %
Weight: 1600 oz

## 2023-03-22 LAB — SURGICAL PCR SCREEN
MRSA, PCR: NEGATIVE
Staphylococcus aureus: NEGATIVE

## 2023-03-22 LAB — CBC
HCT: 38.3 % (ref 36.0–46.0)
Hemoglobin: 12.9 g/dL (ref 12.0–15.0)
MCH: 31.8 pg (ref 26.0–34.0)
MCHC: 33.7 g/dL (ref 30.0–36.0)
MCV: 94.3 fL (ref 80.0–100.0)
Platelets: 211 10*3/uL (ref 150–400)
RBC: 4.06 MIL/uL (ref 3.87–5.11)
RDW: 12.2 % (ref 11.5–15.5)
WBC: 10.1 10*3/uL (ref 4.0–10.5)
nRBC: 0 % (ref 0.0–0.2)

## 2023-03-22 MED ORDER — ORAL CARE MOUTH RINSE
15.0000 mL | OROMUCOSAL | Status: DC
  Administered 2023-03-23 – 2023-03-27 (×9): 15 mL via OROMUCOSAL

## 2023-03-22 MED ORDER — ENSURE ENLIVE PO LIQD
237.0000 mL | Freq: Two times a day (BID) | ORAL | Status: DC
  Administered 2023-03-26: 237 mL via ORAL

## 2023-03-22 MED ORDER — ADULT MULTIVITAMIN LIQUID CH
15.0000 mL | Freq: Every day | ORAL | Status: DC
  Filled 2023-03-22 (×3): qty 15

## 2023-03-22 MED ORDER — POLYETHYLENE GLYCOL 3350 17 G PO PACK
17.0000 g | PACK | Freq: Two times a day (BID) | ORAL | Status: AC
  Administered 2023-03-22 – 2023-03-23 (×2): 17 g via ORAL
  Filled 2023-03-22 (×2): qty 1

## 2023-03-22 MED ORDER — MELATONIN 5 MG PO TABS
5.0000 mg | ORAL_TABLET | Freq: Every evening | ORAL | Status: AC | PRN
  Administered 2023-03-22 – 2023-03-25 (×5): 5 mg via ORAL
  Filled 2023-03-22 (×5): qty 1

## 2023-03-22 MED ORDER — ORAL CARE MOUTH RINSE: 15.0000 mL | OROMUCOSAL | Status: DC | PRN

## 2023-03-22 MED ORDER — POTASSIUM CHLORIDE IN NACL 20-0.9 MEQ/L-% IV SOLN
INTRAVENOUS | Status: DC
  Filled 2023-03-22 (×2): qty 1000

## 2023-03-22 NOTE — Progress Notes (Signed)
As documented, patient has been admitted to the medicine service, for her left hip fracture.  The current plan is for the patient to be brought to surgery by Dr. Magnus Ivan for operative intervention.  Patient may be allowed a diet for today.  I would asked the medicine team to please ensure that she is made n.p.o. after midnight tonight.  Additional recommendations and treatment will be per Dr. Eliberto Ivory team.

## 2023-03-22 NOTE — Consult Note (Signed)
Reason for Consult:Left hip fx Referring Physician: Marinda Elk Time called: 1610 Time at bedside: 0940   Michelle Vazquez is an 89 y.o. female.  HPI: Noam was vacuuming at home when she tripped over the cord and fell. She had immediate left hip pain and could not get up. She was brought to the ED where x-rays showed a hip fx and orthopedic surgery was consulted. She lives at home with her granddaughter and does not use any assistive devices to ambulate.  Past Medical History:  Diagnosis Date   Arthritis    Atrial fibrillation (HCC)    Bowel obstruction (HCC) 2000   Cataract    bilateral removed   Complication of anesthesia    hard to wake after hysterectomy, states heart stopped during a Esophagoscopy, but states she has had several surgeries and procedures with anesthesia and has never had that again   Dysrhythmia    HLD (hyperlipidemia)    Hypercontractile esophagus    Mitral valve prolapse    Osteoporosis    declines rx and declines DEXA f/u   Parathyroid adenoma    post parathyroid surgery in October of 2008   Paroxysmal supraventricular tachycardia    Pneumonia    as a child   Premature ventricular contractions (PVCs) (VPCs)    Schatzki's ring 08/2013   Dr. Rhea Belton, s/p dilation 08/2013, ?benefit   Squamous cell carcinoma in situ of skin    face   Urinary incontinence     Past Surgical History:  Procedure Laterality Date   ABDOMINAL HYSTERECTOMY  1989   BREAST BIOPSY  1979   left   CATARACT EXTRACTION     bilateral   ESOPHAGEAL MANOMETRY N/A 11/17/2015   Procedure: ESOPHAGEAL MANOMETRY (EM);  Surgeon: Beverley Fiedler, MD;  Location: [facility];  Service: Gastroenterology;  Laterality: N/A;   ESOPHAGOSCOPY  09/17/2019   Procedure: Cervical Esophagoscopy;  Surgeon: Osborn Coho, MD;  Location: [facility] OR;  Service: ENT;;   EYE SURGERY     LAPAROSCOPIC SMALL BOWEL RESECTION  2000   LARYNGOSCOPY AND ESOPHAGOSCOPY N/A 10/21/2021   Procedure: DIRECT LARYNGOSCOPY  AND ESOPHAGOSCOPY WITH DILATION OF ESOPHAGEAL NARROWING;  Surgeon: Osborn Coho, MD;  [facility];  Service: ENT;  Laterality: N/A;   LARYNGOSCOPY AND ESOPHAGOSCOPY N/A 07/26/2022   Procedure: DIRECT LARYNGOSCOPY; ESOPHAGOSCOPY AND ESOPHAGEAL DILATION;  Surgeon: Osborn Coho, MD;  Location: [facility] OR;  Service: ENT;  Laterality: N/A;   MICROLARYNGOSCOPY WITH DILATION N/A 09/17/2019   Procedure: MICROLARYNGOSCOPY WITH DILATION;  Surgeon: Osborn Coho, MD;  Location: [facility] OR;  Service: ENT;  Laterality: N/A;   parathyroid surgery  2008?   left   SHOULDER SURGERY  02/1999   right   SKIN LESION EXCISION  08/1999   left side of face    Family History  Problem Relation Age of Onset   Arthritis Father    Heart disease Father    Hypertension Father    Stroke Father    Heart attack Father    Arthritis Mother    Stroke Mother    Stroke Brother    Diabetes Brother    Hypertension Brother    Colon cancer Sister        in her 1's   Hyperlipidemia Sister    Hypertension Sister    Hypertension Sister    Diabetes Sister    Esophageal cancer Neg Hx    Rectal cancer Neg Hx    Stomach cancer Neg Hx     Social History:  reports that she has never smoked. She has never used smokeless tobacco. She reports that she does not drink alcohol and does not use drugs.  Allergies:  Allergies  Allergen Reactions   Diltiazem Hcl Other (See Comments)    Rapid heartbeat and elevated blood pressure   Inderal [Propranolol Hcl]     Pt does not remember reaction   Lopressor [Metoprolol Tartrate]     Per the pt' "I felt like I was going to pass out"   Penicillins Diarrhea    Medications: I have reviewed the patient's current medications.  Results for orders placed or performed during the hospital encounter of 03/21/23 (from the past 48 hour(s))  CBC with Differential/Platelet     Status: Abnormal   Collection Time: 03/21/23  1:43 PM  Result Value Ref Range   WBC 11.8 (H) 4.0 - 10.5 K/uL    RBC 4.39 3.87 - 5.11 MIL/uL   Hemoglobin 13.7 12.0 - 15.0 g/dL   HCT 40.9 81.1 - 91.4 %   MCV 94.5 80.0 - 100.0 fL   MCH 31.2 26.0 - 34.0 pg   MCHC 33.0 30.0 - 36.0 g/dL   RDW 78.2 95.6 - 21.3 %   Platelets 258 150 - 400 K/uL   nRBC 0.0 0.0 - 0.2 %   Neutrophils Relative % 80 %   Neutro Abs 9.3 (H) 1.7 - 7.7 K/uL   Lymphocytes Relative 14 %   Lymphs Abs 1.7 0.7 - 4.0 K/uL   Monocytes Relative 6 %   Monocytes Absolute 0.7 0.1 - 1.0 K/uL   Eosinophils Relative 0 %   Eosinophils Absolute 0.1 0.0 - 0.5 K/uL   Basophils Relative 0 %   Basophils Absolute 0.0 0.0 - 0.1 K/uL   Immature Granulocytes 0 %   Abs Immature Granulocytes 0.04 0.00 - 0.07 K/uL    Comment: Performed at [facility], [address], [place], [place] [address]  Basic metabolic panel     Status: Abnormal   Collection Time: 03/21/23  1:43 PM  Result Value Ref Range   Sodium 137 135 - 145 mmol/L   Potassium 4.1 3.5 - 5.1 mmol/L   Chloride 99 98 - 111 mmol/L   CO2 27 22 - 32 mmol/L   Glucose, Bld 122 (H) 70 - 99 mg/dL    Comment: Glucose reference range applies only to samples taken after fasting for at least 8 hours.   BUN 30 (H) 8 - 23 mg/dL   Creatinine, Ser 8.46 0.44 - 1.00 mg/dL   Calcium 9.4 8.9 - 96.2 mg/dL   GFR, Estimated 55 (L) >60 mL/min    Comment: (NOTE) Calculated using the CKD-EPI Creatinine Equation (2021)    Anion gap 11 5 - 15    Comment: Performed at [facility], [address], [place], [place] [address]  ABO/Rh     Status: None   Collection Time: 03/21/23  1:43 PM  Result Value Ref Range   ABO/RH(D)      A POS Performed at [facility], [address], [place], [place] [address]   Magnesium     Status: None   Collection Time: 03/21/23  1:43 PM  Result Value Ref Range   Magnesium 2.0 1.7 - 2.4 mg/dL    Comment: Performed at [facility], [address], [place], [place] [address]  Type and screen     Status:  None   Collection Time: 03/21/23  2:28 PM  Result Value  Ref Range   ABO/RH(D) A POS    Antibody Screen NEG    Sample Expiration      03/24/2023,2359 Performed at [facility], [address], [place], [place] [address]   Surgical PCR screen     Status: None   Collection Time: 03/22/23 12:16 AM   Specimen: Nasal Mucosa; Nasal Swab  Result Value Ref Range   MRSA, PCR NEGATIVE NEGATIVE   Staphylococcus aureus NEGATIVE NEGATIVE    Comment: (NOTE) The Xpert SA Assay (FDA approved for NASAL specimens in patients 30 years of age and older), is one component of a comprehensive surveillance program. It is not intended to diagnose infection nor to guide or monitor treatment. Performed at [facility], [address], [place], [place] [address]   CBC     Status: None   Collection Time: 03/22/23  3:24 AM  Result Value Ref Range   WBC 10.1 4.0 - 10.5 K/uL   RBC 4.06 3.87 - 5.11 MIL/uL   Hemoglobin 12.9 12.0 - 15.0 g/dL   HCT 09.8 11.9 - 14.7 %   MCV 94.3 80.0 - 100.0 fL   MCH 31.8 26.0 - 34.0 pg   MCHC 33.7 30.0 - 36.0 g/dL   RDW 82.9 56.2 - 13.0 %   Platelets 211 150 - 400 K/uL   nRBC 0.0 0.0 - 0.2 %    Comment: Performed at [facility], [address] [place], [place] [address]  Comprehensive metabolic panel     Status: Abnormal   Collection Time: 03/22/23  3:24 AM  Result Value Ref Range   Sodium 129 (L) 135 - 145 mmol/L   Potassium 3.7 3.5 - 5.1 mmol/L   Chloride 97 (L) 98 - 111 mmol/L   CO2 25 22 - 32 mmol/L   Glucose, Bld 148 (H) 70 - 99 mg/dL    Comment: Glucose reference range applies only to samples taken after fasting for at least 8 hours.   BUN 23 8 - 23 mg/dL   Creatinine, Ser 4.69 0.44 - 1.00 mg/dL   Calcium 8.1 (L) 8.9 - 10.3 mg/dL   Total Protein 6.4 (L) 6.5 - 8.1 g/dL   Albumin 3.4 (L) 3.5 - 5.0 g/dL   AST 19 15 - 41 U/L   ALT 17 0 - 44 U/L   Alkaline Phosphatase 42 38 - 126 U/L   Total Bilirubin 1.2  0.3 - 1.2 mg/dL   GFR, Estimated >62 >95 mL/min    Comment: (NOTE) Calculated using the CKD-EPI Creatinine Equation (2021)    Anion gap 7 5 - 15    Comment: Performed at [facility], [address], [place], [place] [address]    ECHOCARDIOGRAM COMPLETE  Result Date: 03/22/2023    ECHOCARDIOGRAM REPORT   Patient Name:   GLORIANA LOTZ Date of Exam: 03/22/2023 Medical Rec #:  [id number]     Height:       61.0 in Accession #:    [id number]    Weight:       100.0 lb Date of Birth:  [birth year]    BSA:          1.407 m Patient Age:    89 years      BP:           121/73 mmHg Patient Gender: F             HR:           81  bpm. Exam Location:  Inpatient Procedure: 2D Echo, Cardiac Doppler and Color Doppler Indications:    I34.8 Other nonrheumatic mitral valve disorders; I48.91*                 Unspeicified atrial fibrillation; preoperative evaluation  History:        Patient has prior history of Echocardiogram examinations. Mitral                 Valve Prolapse, Arrythmias:PVC; Risk Factors:Hypertension.  Sonographer:    Mike Gip Referring Phys: [id number] DAVID MANUEL ORTIZ IMPRESSIONS  1. Left ventricular ejection fraction, by estimation, is 55 to 60%. The left ventricle has normal function. The left ventricle has no regional wall motion abnormalities. Left ventricular diastolic parameters are consistent with Grade II diastolic dysfunction (pseudonormalization). Elevated left ventricular end-diastolic pressure.  2. Right ventricular systolic function is normal. The right ventricular size is mildly enlarged. There is normal pulmonary artery systolic pressure. The estimated right ventricular systolic pressure is 33.7 mmHg.  3. Left atrial size was severely dilated.  4. The mitral valve is myxomatous. Mild mitral valve regurgitation. No evidence of mitral stenosis. There is mild holosystolic prolapse of the middle scallop of the posterior leaflet of the mitral valve.  5. The aortic valve is  normal in structure. Aortic valve regurgitation is mild. No aortic stenosis is present. Aortic regurgitation PHT measures 469 msec.  6. The inferior vena cava is normal in size with greater than 50% respiratory variability, suggesting right atrial pressure of 3 mmHg. FINDINGS  Left Ventricle: Left ventricular ejection fraction, by estimation, is 55 to 60%. The left ventricle has normal function. The left ventricle has no regional wall motion abnormalities. The left ventricular internal cavity size was normal in size. There is  no left ventricular hypertrophy. Left ventricular diastolic parameters are consistent with Grade II diastolic dysfunction (pseudonormalization). Elevated left ventricular end-diastolic pressure. Right Ventricle: The right ventricular size is mildly enlarged. No increase in right ventricular wall thickness. Right ventricular systolic function is normal. There is normal pulmonary artery systolic pressure. The tricuspid regurgitant velocity is 2.77  m/s, and with an assumed right atrial pressure of 3 mmHg, the estimated right ventricular systolic pressure is 33.7 mmHg. Left Atrium: Left atrial size was severely dilated. Right Atrium: Right atrial size was normal in size. Pericardium: There is no evidence of pericardial effusion. Mitral Valve: The mitral valve is myxomatous. There is mild holosystolic prolapse of the middle scallop of the posterior leaflet of the mitral valve. Mild mitral valve regurgitation. No evidence of mitral valve stenosis. Tricuspid Valve: The tricuspid valve is normal in structure. Tricuspid valve regurgitation is trivial. No evidence of tricuspid stenosis. Aortic Valve: The aortic valve is normal in structure. Aortic valve regurgitation is mild. Aortic regurgitation PHT measures 469 msec. No aortic stenosis is present. Pulmonic Valve: The pulmonic valve was normal in structure. Pulmonic valve regurgitation is not visualized. No evidence of pulmonic stenosis. Aorta: The  aortic root is normal in size and structure. Venous: The inferior vena cava is normal in size with greater than 50% respiratory variability, suggesting right atrial pressure of 3 mmHg. IAS/Shunts: No atrial level shunt detected by color flow Doppler.  LEFT VENTRICLE PLAX 2D LVIDd:         3.10 cm     Diastology LVIDs:         2.20 cm     LV e' medial:    5.98 cm/s LV PW:  1.10 cm     LV E/e' medial:  16.6 LV IVS:        1.00 cm     LV e' lateral:   7.94 cm/s LVOT diam:     1.80 cm     LV E/e' lateral: 12.5 LV SV:         35 LV SV Index:   25 LVOT Area:     2.54 cm  LV Volumes (MOD) LV vol d, MOD A2C: 66.0 ml LV vol d, MOD A4C: 61.3 ml LV vol s, MOD A2C: 26.0 ml LV vol s, MOD A4C: 23.5 ml LV SV MOD A2C:     40.0 ml LV SV MOD A4C:     61.3 ml LV SV MOD BP:      38.9 ml RIGHT VENTRICLE             IVC RV Basal diam:  4.10 cm     IVC diam: 1.40 cm RV S prime:     10.90 cm/s TAPSE (M-mode): 2.2 cm LEFT ATRIUM             Index        RIGHT ATRIUM           Index LA diam:        2.40 cm 1.71 cm/m   RA Area:     14.50 cm LA Vol (A2C):   66.1 ml 46.99 ml/m  RA Volume:   35.50 ml  25.23 ml/m LA Vol (A4C):   62.0 ml 44.07 ml/m LA Biplane Vol: 65.4 ml 46.49 ml/m  AORTIC VALVE LVOT Vmax:   70.50 cm/s LVOT Vmean:  48.900 cm/s LVOT VTI:    0.139 m AI PHT:      469 msec  AORTA Ao Root diam: 2.90 cm Ao Asc diam:  2.50 cm MITRAL VALVE               TRICUSPID VALVE MV Area (PHT): 5.13 cm    TR Peak grad:   30.7 mmHg MV Decel Time: 148 msec    TR Vmax:        277.00 cm/s MV E velocity: 99.40 cm/s MV A velocity: 99.80 cm/s  SHUNTS MV E/A ratio:  1.00        Systemic VTI:  0.14 m                            Systemic Diam: 1.80 cm Armanda Magic MD Electronically signed by Armanda Magic MD Signature Date/Time: 03/22/2023/9:28:28 AM    Final    DG Hip Unilat W or Wo Pelvis 2-3 Views Left  Result Date: 03/21/2023 CLINICAL DATA:  Fall.  Left hip pain. EXAM: DG HIP (WITH OR WITHOUT PELVIS) 2-3V LEFT COMPARISON:  None Available.  FINDINGS: There is subcapital impaction fracture of the left femoral neck with mild superior displacement of the distal femur. IMPRESSION: Subcapital impaction fracture of the left femoral neck. Electronically Signed   By: Larose Hires D.O.   On: 03/21/2023 15:09   DG Chest 1 View  Result Date: 03/21/2023 CLINICAL DATA:  Preop EXAM: CHEST  1 VIEW COMPARISON:  CXR 09/13/22 FINDINGS: No pleural effusion. No pneumothorax. Prominent cardiac contours. No focal airspace opacity. No radiographically displaced rib fractures. Visualized upper abdomen is unremarkable. IMPRESSION: No focal airspace opacity Electronically Signed   By: Lorenza Cambridge M.D.   On: 03/21/2023 15:09    Review of Systems  HENT:  Negative  for ear discharge, ear pain, hearing loss and tinnitus.   Eyes:  Negative for photophobia and pain.  Respiratory:  Negative for cough and shortness of breath.   Cardiovascular:  Negative for chest pain.  Gastrointestinal:  Negative for abdominal pain, nausea and vomiting.  Genitourinary:  Negative for dysuria, flank pain, frequency and urgency.  Musculoskeletal:  Positive for arthralgias (Left hip). Negative for back pain, myalgias and neck pain.  Neurological:  Negative for dizziness and headaches.  Hematological:  Does not bruise/bleed easily.  Psychiatric/Behavioral:  The patient is not nervous/anxious.    Blood pressure 127/75, pulse 79, temperature 98 F (36.7 C), temperature source Oral, resp. rate 16, height 5\' 1"  (1.549 m), weight 45.4 kg, SpO2 100 %. Physical Exam Constitutional:      General: She is not in acute distress.    Appearance: She is well-developed. She is not diaphoretic.  HENT:     Head: Normocephalic and atraumatic.  Eyes:     General: No scleral icterus.       Right eye: No discharge.        Left eye: No discharge.     Conjunctiva/sclera: Conjunctivae normal.  Cardiovascular:     Rate and Rhythm: Normal rate and regular rhythm.  Pulmonary:     Effort: Pulmonary  effort is normal. No respiratory distress.  Musculoskeletal:     Cervical back: Normal range of motion.     Comments: LLE No traumatic wounds, ecchymosis, or rash  Mod TTP hip  No knee or ankle effusion  Knee stable to varus/ valgus and anterior/posterior stress  Sens DPN, SPN, TN intact  Motor EHL, ext, flex, evers 5/5  DP 2+, PT 1+, No significant edema  Skin:    General: Skin is warm and dry.  Neurological:     Mental Status: She is alert.  Psychiatric:        Mood and Affect: Mood normal.        Behavior: Behavior normal.     Assessment/Plan: Left hip fx -- Plan hip hemi tomorrow with Dr. Magnus Ivan. Please keep NPO after MN. Multiple medical problems including osteoarthritis, chronic atrial fibrillation not on anticoagulation, paroxysmal SVT, history of bowel obstruction, bilateral cataracts, bilateral cataract surgery, hyperlipidemia, hypercontractile esophagus, mitral valve prolapse, osteoporosis, parathyroid adenoma, face squamous cell CA, and urinary incontinence -- per primary service    Freeman Caldron, PA-C Orthopedic Surgery [phone] 03/22/2023, 10:03 AM

## 2023-03-22 NOTE — Progress Notes (Signed)
Echocardiogram 2D Echocardiogram has been performed.  Toni Amend 03/22/2023, 8:50 AM

## 2023-03-22 NOTE — Progress Notes (Signed)
Initial Nutrition Assessment  DOCUMENTATION CODES:   Not applicable  INTERVENTION:  - DYS 3 diet.  - Magic cup BID with meals, each supplement provides 290 kcal and 9 grams of protein - Encourage intake at all meals and of supplements.  - Multivitamin with minerals daily - liquid as patient reported difficulty swallowing pills. - Monitor weight trends.   NUTRITION DIAGNOSIS:   Increased nutrient needs related to acute illness (L hip fracture) as evidenced by estimated needs.  GOAL:   Patient will meet greater than or equal to 90% of their needs  MONITOR:   PO intake, Supplement acceptance, Weight trends  REASON FOR ASSESSMENT:   Consult Hip fracture protocol  ASSESSMENT:   89 y.o. female with PMH osteoporosis and squamous cell cancer who presented after fall with resulting left hip fracture.   Patient endorses recent UBW of 100# but notes she has lost about 5# over the past year. Per EMR, weight stable between 100-101# this last year.   She endorses eating 3 meals a day at home but is somewhat limited by esophageal stricture. Mostly eats soft foods or drinks liquids. Often has a smoothie for meals with 1 scoop of whey protein (per visitor: contains 30g protein per scoop). Patient shares she does not do well with a lot of meats, mostly has soft eggs. Appetite is usually good. Patient endorses taking a multivitamin and vitamin D/calcium at home.   Current appetite is good, patient allowed a diet today but NPO for surgery tomorrow. She does not like traditional supplements like Ensure and Boost but agreeable to try [employer]. Discussed importance of ordering and trying to consume 3 meals a day to promote healing and help prevent weight loss during admission.   Medications reviewed and include: Miralax  Labs reviewed:  Na 129   NUTRITION - FOCUSED PHYSICAL EXAM:  Patient refused exam  Diet Order:   Diet Order             Diet NPO time specified  Diet effective  midnight           DIET DYS 3 Room service appropriate? Yes; Fluid consistency: Thin  Diet effective now                   EDUCATION NEEDS:  Education needs have been addressed  Skin:  Skin Assessment: Reviewed RN Assessment  Last BM:  5/14  Height:  Ht Readings from Last 1 Encounters:  03/21/23 5\' 1"  (1.549 m)   Weight:  Wt Readings from Last 1 Encounters:  03/21/23 45.4 kg    BMI:  Body mass index is 18.89 kg/m.  Estimated Nutritional Needs:  Kcal:  1500-1650 kcals Protein:  55-70 grams Fluid:  >/= 1.5L   Shelle Iron RD, LDN For contact information, refer to [facility].

## 2023-03-22 NOTE — Progress Notes (Addendum)
[facility] PROGRESS NOTE    Progress Note  Michelle Vazquez  ZOX:[id number] DOB: [birth year] DOA: 03/21/2023 PCP: Pincus Sanes, MD     Brief Narrative:   Michelle Vazquez is an 89 y.o. female past medical history significant for osteoarthritis, chronic atrial fibrillation, with paroxysmal SVT not on anticoagulation, osteoporosis and history of squamous cell cancer comes in after mechanical fall on the left hip developing immediate pain and inability to bear weight left hip x-ray shows subcapital left femur fracture   Assessment/Plan:   Closed left hip fracture, initial encounter (HCC) Continue narcotics for analgesics. Orthopedic surgery has been consulted recommended the patient to be n.p.o. for surgical intervention in the morning. PT OT has been consulted for postsurgical evaluation anticipate she will need skilled nursing facility. Patient is currently n.p.o. start IV fluids.  Prolonged QTc/unifocal PVCs/paroxysmal SVT: With a chads Vascor of at least 6. Continue flecainide not on anticoagulation. Try to potassium greater than 4 magnesium greater than 2.    Heart failure grade 2 diastolic dysfunction Appears euvolemic.  Hyperlipidemia: Noted.  Hyperglycemia: Likely reactive.  Essential hypertension: Not on any antihypertensive medications at home.  Chronic kidney disease stage IIIa: At baseline.  Esophageal motility/severe esophageal stricture: Was able to take a diet resume dysphagia 3 diet with Ensure. She is followed at [facility] for this.   DVT prophylaxis: lovenox Family Communication:non Status is: Inpatient Remains inpatient appropriate because: Acute left hip fracture will probably need skilled nursing facility    Code Status:     Code Status Orders  (From admission, onward)           Start     Ordered   03/21/23 1645  Full code  Continuous       Question:  By:  Answer:  Consent: discussion documented in EHR   03/21/23 1647            Code Status History     Date Active Date Inactive Code Status Order ID Comments User Context   03/21/2023 1647 03/21/2023 1647 Full Code [id number]  Bobette Mo, MD ED      Advance Directive Documentation    Flowsheet Row Most Recent Value  Type of Advance Directive Healthcare Power of Attorney  Pre-existing out of facility DNR order (yellow form or pink MOST form) --  "MOST" Form in Place? --         IV Access:   Peripheral IV   Procedures and diagnostic studies:   DG Hip Unilat W or Wo Pelvis 2-3 Views Left  Result Date: 03/21/2023 CLINICAL DATA:  Fall.  Left hip pain. EXAM: DG HIP (WITH OR WITHOUT PELVIS) 2-3V LEFT COMPARISON:  None Available. FINDINGS: There is subcapital impaction fracture of the left femoral neck with mild superior displacement of the distal femur. IMPRESSION: Subcapital impaction fracture of the left femoral neck. Electronically Signed   By: Larose Hires D.O.   On: 03/21/2023 15:09   DG Chest 1 View  Result Date: 03/21/2023 CLINICAL DATA:  Preop EXAM: CHEST  1 VIEW COMPARISON:  CXR 09/13/22 FINDINGS: No pleural effusion. No pneumothorax. Prominent cardiac contours. No focal airspace opacity. No radiographically displaced rib fractures. Visualized upper abdomen is unremarkable. IMPRESSION: No focal airspace opacity Electronically Signed   By: Lorenza Cambridge M.D.   On: 03/21/2023 15:09     Medical Consultants:   None.   Subjective:    Michelle Vazquez no complaints.  Objective:    Vitals:   03/21/23 1940  03/21/23 1952 03/22/23 0146 03/22/23 0501  BP: (!) 159/87 (!) 170/92 123/70 121/73  Pulse: 95 100 92 91  Resp: 16 20 18 17   Temp:  99.2 F (37.3 C) 98.3 F (36.8 C) 98.2 F (36.8 C)  TempSrc:  Oral Oral Oral  SpO2: 93% 93% 92% 94%  Weight:      Height:       SpO2: 94 %   Intake/Output Summary (Last 24 hours) at 03/22/2023 0724 Last data filed at 03/22/2023 0600 Gross per 24 hour  Intake 370 ml  Output 650 ml  Net -280  ml   Filed Weights   03/21/23 1335  Weight: 45.4 kg    Exam: General exam: In no acute distress. Respiratory system: Good air movement and clear to auscultation. Cardiovascular system: S1 & S2 heard, RRR. No JVD. Gastrointestinal system: Abdomen is nondistended, soft and nontender.  Extremities: No pedal edema. Skin: No rashes, lesions or ulcers Psychiatry: Judgement and insight appear normal. Mood & affect appropriate.    Data Reviewed:    Labs: Basic Metabolic Panel: Recent Labs  Lab 03/21/23 1343 03/22/23 0324  NA 137 129*  K 4.1 3.7  CL 99 97*  CO2 27 25  GLUCOSE 122* 148*  BUN 30* 23  CREATININE 0.97 0.87  CALCIUM 9.4 8.1*  MG 2.0  --    GFR Estimated Creatinine Clearance: 30.2 mL/min (by C-G formula based on SCr of 0.87 mg/dL). Liver Function Tests: Recent Labs  Lab 03/22/23 0324  AST 19  ALT 17  ALKPHOS 42  BILITOT 1.2  PROT 6.4*  ALBUMIN 3.4*   No results for input(s): "LIPASE", "AMYLASE" in the last 168 hours. No results for input(s): "AMMONIA" in the last 168 hours. Coagulation profile No results for input(s): "INR", "PROTIME" in the last 168 hours. COVID-19 Labs  No results for input(s): "DDIMER", "FERRITIN", "LDH", "CRP" in the last 72 hours.  Lab Results  Component Value Date   SARSCOV2NAA NEGATIVE 07/26/2022   SARSCOV2NAA NEGATIVE 09/15/2019    CBC: Recent Labs  Lab 03/21/23 1343 03/22/23 0324  WBC 11.8* 10.1  NEUTROABS 9.3*  --   HGB 13.7 12.9  HCT 41.5 38.3  MCV 94.5 94.3  PLT 258 211   Cardiac Enzymes: No results for input(s): "CKTOTAL", "CKMB", "CKMBINDEX", "TROPONINI" in the last 168 hours. BNP (last 3 results) No results for input(s): "PROBNP" in the last 8760 hours. CBG: No results for input(s): "GLUCAP" in the last 168 hours. D-Dimer: No results for input(s): "DDIMER" in the last 72 hours. Hgb A1c: No results for input(s): "HGBA1C" in the last 72 hours. Lipid Profile: No results for input(s): "CHOL", "HDL",  "LDLCALC", "TRIG", "CHOLHDL", "LDLDIRECT" in the last 72 hours. Thyroid function studies: No results for input(s): "TSH", "T4TOTAL", "T3FREE", "THYROIDAB" in the last 72 hours.  Invalid input(s): "FREET3" Anemia work up: No results for input(s): "VITAMINB12", "FOLATE", "FERRITIN", "TIBC", "IRON", "RETICCTPCT" in the last 72 hours. Sepsis Labs: Recent Labs  Lab 03/21/23 1343 03/22/23 0324  WBC 11.8* 10.1   Microbiology Recent Results (from the past 240 hour(s))  Surgical PCR screen     Status: None   Collection Time: 03/22/23 12:16 AM   Specimen: Nasal Mucosa; Nasal Swab  Result Value Ref Range Status   MRSA, PCR NEGATIVE NEGATIVE Final   Staphylococcus aureus NEGATIVE NEGATIVE Final    Comment: (NOTE) The Xpert SA Assay (FDA approved for NASAL specimens in patients 63 years of age and older), is one component of a comprehensive surveillance program.  It is not intended to diagnose infection nor to guide or monitor treatment. Performed at [facility], [address], [place], [place] [address]      Medications:    flecainide  75 mg Oral BID   mupirocin ointment  1 Application Nasal BID   Continuous Infusions:    LOS: 1 day   Marinda Elk  [facility]  03/22/2023, 7:24 AM

## 2023-03-23 ENCOUNTER — Encounter (HOSPITAL_COMMUNITY)
Admission: EM | Disposition: A | Discharge status: Rehabilitation Facility | Payer: Self-pay | Source: Home / Self Care | Attending: Internal Medicine

## 2023-03-23 ENCOUNTER — Inpatient Hospital Stay (HOSPITAL_COMMUNITY): Payer: Medicare Other | Admitting: Anesthesiology

## 2023-03-23 ENCOUNTER — Encounter (HOSPITAL_COMMUNITY): Payer: Self-pay

## 2023-03-23 ENCOUNTER — Inpatient Hospital Stay (HOSPITAL_COMMUNITY): Payer: Medicare Other

## 2023-03-23 ENCOUNTER — Other Ambulatory Visit: Payer: Self-pay

## 2023-03-23 DIAGNOSIS — I10 Essential (primary) hypertension: Secondary | ICD-10-CM

## 2023-03-23 DIAGNOSIS — S72002A Fracture of unspecified part of neck of left femur, initial encounter for closed fracture: Secondary | ICD-10-CM | POA: Diagnosis not present

## 2023-03-23 DIAGNOSIS — I4891 Unspecified atrial fibrillation: Secondary | ICD-10-CM | POA: Diagnosis not present

## 2023-03-23 DIAGNOSIS — K222 Esophageal obstruction: Secondary | ICD-10-CM | POA: Diagnosis not present

## 2023-03-23 DIAGNOSIS — N1831 Chronic kidney disease, stage 3a: Secondary | ICD-10-CM | POA: Diagnosis not present

## 2023-03-23 HISTORY — PX: TOTAL HIP ARTHROPLASTY: SHX124

## 2023-03-23 LAB — BASIC METABOLIC PANEL
Anion gap: 6 (ref 5–15)
BUN: 18 mg/dL (ref 8–23)
CO2: 25 mmol/L (ref 22–32)
Calcium: 8 mg/dL — ABNORMAL LOW (ref 8.9–10.3)
Chloride: 105 mmol/L (ref 98–111)
Creatinine, Ser: 0.74 mg/dL (ref 0.44–1.00)
GFR, Estimated: >60 mL/min (ref >60)
Glucose, Bld: 118 mg/dL — ABNORMAL HIGH (ref 70–99)
Potassium: 4.2 mmol/L (ref 3.5–5.1)
Sodium: 136 mmol/L (ref 135–145)

## 2023-03-23 SURGERY — ARTHROPLASTY, HIP, TOTAL, ANTERIOR APPROACH
Anesthesia: General | Site: Hip | Laterality: Left

## 2023-03-23 MED ORDER — CHLORHEXIDINE GLUCONATE 4 % EX SOLN: 60.0000 mL | Freq: Once | CUTANEOUS | Status: DC

## 2023-03-23 MED ORDER — FENTANYL CITRATE PF 50 MCG/ML IJ SOSY
PREFILLED_SYRINGE | INTRAMUSCULAR | Status: AC
  Administered 2023-03-23: 25 ug via INTRAVENOUS
  Filled 2023-03-23: qty 1

## 2023-03-23 MED ORDER — CEFAZOLIN SODIUM-DEXTROSE 1-4 GM/50ML-% IV SOLN
1.0000 g | Freq: Four times a day (QID) | INTRAVENOUS | Status: AC
  Administered 2023-03-23 – 2023-03-24 (×2): 1 g via INTRAVENOUS
  Filled 2023-03-23 (×2): qty 50

## 2023-03-23 MED ORDER — DEXAMETHASONE SODIUM PHOSPHATE 10 MG/ML IJ SOLN
INTRAMUSCULAR | Status: DC | PRN
  Administered 2023-03-23: 10 mg via INTRAVENOUS

## 2023-03-23 MED ORDER — METHOCARBAMOL 500 MG IVPB - SIMPLE MED
500.0000 mg | Freq: Four times a day (QID) | INTRAVENOUS | Status: DC | PRN
  Administered 2023-03-23: 500 mg via INTRAVENOUS

## 2023-03-23 MED ORDER — PHENYLEPHRINE HCL (PRESSORS) 10 MG/ML IV SOLN
INTRAVENOUS | Status: DC | PRN
  Administered 2023-03-23: 160 ug via INTRAVENOUS

## 2023-03-23 MED ORDER — SODIUM CHLORIDE 0.9 % IR SOLN
Status: DC | PRN
  Administered 2023-03-23: 1000 mL

## 2023-03-23 MED ORDER — LACTATED RINGERS IV SOLN: INTRAVENOUS | Status: DC

## 2023-03-23 MED ORDER — 0.9 % SODIUM CHLORIDE (POUR BTL) OPTIME
TOPICAL | Status: DC | PRN
  Administered 2023-03-23: 1000 mL

## 2023-03-23 MED ORDER — POVIDONE-IODINE 10 % EX SWAB
2.0000 | Freq: Once | CUTANEOUS | Status: AC
  Administered 2023-03-23: 2 via TOPICAL

## 2023-03-23 MED ORDER — DIPHENHYDRAMINE HCL 12.5 MG/5ML PO ELIX: 12.5000 mg | ORAL_SOLUTION | ORAL | Status: DC | PRN

## 2023-03-23 MED ORDER — PROPOFOL 10 MG/ML IV BOLUS
INTRAVENOUS | Status: AC
  Filled 2023-03-23: qty 20

## 2023-03-23 MED ORDER — ACETAMINOPHEN 10 MG/ML IV SOLN
INTRAVENOUS | Status: AC
  Administered 2023-03-23: 500 mg via INTRAVENOUS
  Filled 2023-03-23: qty 100

## 2023-03-23 MED ORDER — FENTANYL CITRATE (PF) 100 MCG/2ML IJ SOLN
INTRAMUSCULAR | Status: DC | PRN
  Administered 2023-03-23 (×3): 50 ug via INTRAVENOUS

## 2023-03-23 MED ORDER — FENTANYL CITRATE PF 50 MCG/ML IJ SOSY
25.0000 ug | PREFILLED_SYRINGE | INTRAMUSCULAR | Status: DC | PRN
  Administered 2023-03-23 (×2): 25 ug via INTRAVENOUS

## 2023-03-23 MED ORDER — ROCURONIUM BROMIDE 100 MG/10ML IV SOLN
INTRAVENOUS | Status: DC | PRN
  Administered 2023-03-23: 30 mg via INTRAVENOUS

## 2023-03-23 MED ORDER — ESMOLOL HCL 100 MG/10ML IV SOLN
INTRAVENOUS | Status: DC | PRN
  Administered 2023-03-23: 10 mg via INTRAVENOUS

## 2023-03-23 MED ORDER — SUGAMMADEX SODIUM 200 MG/2ML IV SOLN
INTRAVENOUS | Status: DC | PRN
  Administered 2023-03-23: 150 mg via INTRAVENOUS

## 2023-03-23 MED ORDER — STERILE WATER FOR IRRIGATION IR SOLN
Status: DC | PRN
  Administered 2023-03-23: 1000 mL

## 2023-03-23 MED ORDER — ACETAMINOPHEN 325 MG PO TABS: 650.0000 mg | ORAL_TABLET | Freq: Once | ORAL | Status: DC

## 2023-03-23 MED ORDER — METOCLOPRAMIDE HCL 5 MG PO TABS: 5.0000 mg | ORAL_TABLET | Freq: Three times a day (TID) | ORAL | Status: DC | PRN

## 2023-03-23 MED ORDER — ACETAMINOPHEN 325 MG PO TABS: 325.0000 mg | ORAL_TABLET | Freq: Four times a day (QID) | ORAL | Status: DC | PRN

## 2023-03-23 MED ORDER — MORPHINE SULFATE (PF) 2 MG/ML IV SOLN: 0.5000 mg | INTRAVENOUS | Status: DC | PRN

## 2023-03-23 MED ORDER — PHENYLEPHRINE HCL-NACL 20-0.9 MG/250ML-% IV SOLN
INTRAVENOUS | Status: DC | PRN
  Administered 2023-03-23: 25 ug/min via INTRAVENOUS

## 2023-03-23 MED ORDER — PROPOFOL 10 MG/ML IV BOLUS
INTRAVENOUS | Status: DC | PRN
  Administered 2023-03-23: 80 mg via INTRAVENOUS

## 2023-03-23 MED ORDER — TRANEXAMIC ACID-NACL 1000-0.7 MG/100ML-% IV SOLN
1000.0000 mg | INTRAVENOUS | Status: DC
  Filled 2023-03-23: qty 100

## 2023-03-23 MED ORDER — ALUM & MAG HYDROXIDE-SIMETH 200-200-20 MG/5ML PO SUSP: 30.0000 mL | ORAL | Status: DC | PRN

## 2023-03-23 MED ORDER — PHENOL 1.4 % MT LIQD
1.0000 | OROMUCOSAL | Status: DC | PRN
  Administered 2023-03-24 – 2023-03-25 (×2): 1 via OROMUCOSAL
  Filled 2023-03-23: qty 177

## 2023-03-23 MED ORDER — METOCLOPRAMIDE HCL 5 MG/ML IJ SOLN: 5.0000 mg | Freq: Three times a day (TID) | INTRAMUSCULAR | Status: DC | PRN

## 2023-03-23 MED ORDER — MENTHOL 3 MG MT LOZG: 1.0000 | LOZENGE | OROMUCOSAL | Status: DC | PRN

## 2023-03-23 MED ORDER — ONDANSETRON HCL 4 MG/2ML IJ SOLN
INTRAMUSCULAR | Status: DC | PRN
  Administered 2023-03-23: 4 mg via INTRAVENOUS

## 2023-03-23 MED ORDER — AMISULPRIDE (ANTIEMETIC) 5 MG/2ML IV SOLN: 10.0000 mg | Freq: Once | INTRAVENOUS | Status: DC | PRN

## 2023-03-23 MED ORDER — POTASSIUM CHLORIDE IN NACL 20-0.9 MEQ/L-% IV SOLN
INTRAVENOUS | Status: AC
  Filled 2023-03-23: qty 1000

## 2023-03-23 MED ORDER — LIDOCAINE HCL (CARDIAC) PF 100 MG/5ML IV SOSY
PREFILLED_SYRINGE | INTRAVENOUS | Status: DC | PRN
  Administered 2023-03-23: 40 mg via INTRAVENOUS

## 2023-03-23 MED ORDER — FENTANYL CITRATE (PF) 100 MCG/2ML IJ SOLN
INTRAMUSCULAR | Status: AC
  Filled 2023-03-23: qty 2

## 2023-03-23 MED ORDER — ACETAMINOPHEN 10 MG/ML IV SOLN: 500.0000 mg | Freq: Once | INTRAVENOUS | Status: AC

## 2023-03-23 MED ORDER — HYDROCODONE-ACETAMINOPHEN 7.5-325 MG PO TABS: 1.0000 | ORAL_TABLET | ORAL | Status: DC | PRN

## 2023-03-23 MED ORDER — ASPIRIN 81 MG PO CHEW
81.0000 mg | CHEWABLE_TABLET | Freq: Two times a day (BID) | ORAL | Status: DC
  Administered 2023-03-23 – 2023-03-27 (×8): 81 mg via ORAL
  Filled 2023-03-23 (×8): qty 1

## 2023-03-23 MED ORDER — DOCUSATE SODIUM 100 MG PO CAPS
100.0000 mg | ORAL_CAPSULE | Freq: Two times a day (BID) | ORAL | Status: DC
  Administered 2023-03-23: 100 mg via ORAL
  Filled 2023-03-23 (×2): qty 1

## 2023-03-23 MED ORDER — METHOCARBAMOL 500 MG IVPB - SIMPLE MED
INTRAVENOUS | Status: AC
  Filled 2023-03-23: qty 55

## 2023-03-23 MED ORDER — CHLORHEXIDINE GLUCONATE 0.12 % MT SOLN
15.0000 mL | Freq: Once | OROMUCOSAL | Status: AC
  Administered 2023-03-23: 15 mL via OROMUCOSAL

## 2023-03-23 MED ORDER — SODIUM CHLORIDE 0.9 % IV SOLN: INTRAVENOUS | Status: DC

## 2023-03-23 MED ORDER — CEFAZOLIN SODIUM-DEXTROSE 2-4 GM/100ML-% IV SOLN
2.0000 g | INTRAVENOUS | Status: AC
  Administered 2023-03-23: 2 g via INTRAVENOUS
  Filled 2023-03-23: qty 100

## 2023-03-23 MED ORDER — HYDROCODONE-ACETAMINOPHEN 5-325 MG PO TABS: 1.0000 | ORAL_TABLET | ORAL | Status: DC | PRN

## 2023-03-23 MED ORDER — PANTOPRAZOLE SODIUM 40 MG PO TBEC
40.0000 mg | DELAYED_RELEASE_TABLET | Freq: Every day | ORAL | Status: DC
  Administered 2023-03-23 – 2023-03-25 (×3): 40 mg via ORAL
  Filled 2023-03-23 (×3): qty 1

## 2023-03-23 MED ORDER — METHOCARBAMOL 500 MG PO TABS: 500.0000 mg | ORAL_TABLET | Freq: Four times a day (QID) | ORAL | Status: DC | PRN

## 2023-03-23 SURGICAL SUPPLY — 43 items
APL SKNCLS STERI-STRIP NONHPOA (GAUZE/BANDAGES/DRESSINGS)
BAG COUNTER SPONGE SURGICOUNT (BAG) ×1 IMPLANT
BAG SPEC THK2 15X12 ZIP CLS (MISCELLANEOUS)
BAG SPNG CNTER NS LX DISP (BAG) ×1
BAG ZIPLOCK 12X15 (MISCELLANEOUS) IMPLANT
BENZOIN TINCTURE PRP APPL 2/3 (GAUZE/BANDAGES/DRESSINGS) IMPLANT
BIPOLAR PROS AML 44 (Hips) ×1 IMPLANT
BLADE SAW SGTL 18X1.27X75 (BLADE) ×1 IMPLANT
COVER PERINEAL POST (MISCELLANEOUS) ×1 IMPLANT
COVER SURGICAL LIGHT HANDLE (MISCELLANEOUS) ×1 IMPLANT
DRAPE FOOT SWITCH (DRAPES) ×1 IMPLANT
DRAPE STERI IOBAN 125X83 (DRAPES) ×1 IMPLANT
DRAPE U-SHAPE 47X51 STRL (DRAPES) ×2 IMPLANT
DRESSING AQUACEL AG SP 3.5X10 (GAUZE/BANDAGES/DRESSINGS) IMPLANT
DRSG AQUACEL AG ADV 3.5X10 (GAUZE/BANDAGES/DRESSINGS) ×1 IMPLANT
DRSG AQUACEL AG SP 3.5X10 (GAUZE/BANDAGES/DRESSINGS) ×1
DURAPREP 26ML APPLICATOR (WOUND CARE) ×1 IMPLANT
ELECT REM PT RETURN 15FT ADLT (MISCELLANEOUS) ×1 IMPLANT
GAUZE XEROFORM 1X8 LF (GAUZE/BANDAGES/DRESSINGS) ×1 IMPLANT
GLOVE BIO SURGEON STRL SZ7.5 (GLOVE) ×1 IMPLANT
GLOVE BIOGEL PI IND STRL 8 (GLOVE) ×2 IMPLANT
GLOVE ECLIPSE 8.0 STRL XLNG CF (GLOVE) ×1 IMPLANT
GOWN STRL REUS W/ TWL XL LVL3 (GOWN DISPOSABLE) ×2 IMPLANT
GOWN STRL REUS W/TWL XL LVL3 (GOWN DISPOSABLE) ×2
HANDPIECE INTERPULSE COAX TIP (DISPOSABLE) ×1
HEAD BIPOLAR PROS AML 44 (Hips) IMPLANT
HEAD FEM STD 28X+1.5 STRL (Hips) IMPLANT
HOLDER FOLEY CATH W/STRAP (MISCELLANEOUS) ×1 IMPLANT
KIT TURNOVER KIT A (KITS) IMPLANT
PACK ANTERIOR HIP CUSTOM (KITS) ×1 IMPLANT
SET HNDPC FAN SPRY TIP SCT (DISPOSABLE) ×1 IMPLANT
STAPLER VISISTAT 35W (STAPLE) IMPLANT
STEM CORAIL KA12 (Stem) IMPLANT
STRIP CLOSURE SKIN 1/2X4 (GAUZE/BANDAGES/DRESSINGS) IMPLANT
SUT ETHIBOND NAB CT1 #1 30IN (SUTURE) ×1 IMPLANT
SUT ETHILON 2 0 PS N (SUTURE) IMPLANT
SUT MNCRL AB 4-0 PS2 18 (SUTURE) IMPLANT
SUT VIC AB 0 CT1 36 (SUTURE) ×1 IMPLANT
SUT VIC AB 1 CT1 36 (SUTURE) ×1 IMPLANT
SUT VIC AB 2-0 CT1 27 (SUTURE) ×2
SUT VIC AB 2-0 CT1 TAPERPNT 27 (SUTURE) ×2 IMPLANT
TRAY FOLEY MTR SLVR 16FR STAT (SET/KITS/TRAYS/PACK) IMPLANT
YANKAUER SUCT BULB TIP NO VENT (SUCTIONS) ×1 IMPLANT

## 2023-03-23 NOTE — TOC Initial Note (Addendum)
Transition of Care [facility]) - Initial/Assessment Note    Patient Details  Name: Michelle Vazquez MRN: [id number] Date of Birth: 1929-07-25  [facility]) CM/SW Contact:    Amada Jupiter, LCSW Phone Number: 03/23/2023, 2:39 PM  Clinical Narrative:                  Met patient and family today to introduce TOC/ CSW role and begin dc planning discussion.  Pt awaiting surgery for hip fx today but notes  she is hopeful she will be able to dc home.  Daughter and granddaughter present and we did discuss possible home dc with HH vs short term SNF for rehab - await therapy evaluations/ recommendations at this point.  Granddaughter does live in the home with pt but is working as well.  Pt and family report pt is completely independent at baseline and very active/ still driving. All agreed TOC will follow up after evals completed to confirm dc plans. Expected Discharge Plan: Home w Home Health Services (vs. possible SNF rehab) Barriers to Discharge: Continued Medical Work up   Patient Goals and CMS Choice Patient states their goals for this hospitalization and ongoing recovery are:: return home          Expected Discharge Plan and Services In-house Referral: Clinical Social Work     Living arrangements for the past 2 months: Single Family Home                                      Prior Living Arrangements/Services Living arrangements for the past 2 months: Single Family Home Lives with:: Relatives (granddaughter moved in with pt ~  2 wks ago) Patient language and need for interpreter reviewed:: Yes        Need for Family Participation in Patient Care: Yes (Comment) Care giver support system in place?: Yes (comment)      Activities of Daily Living Home Assistive Devices/Equipment: Blood pressure cuff, Eyeglasses, Dentures (specify type) ADL Screening (condition at time of admission) Patient's cognitive ability adequate to safely complete daily activities?: Yes Is the  patient deaf or have difficulty hearing?: No Does the patient have difficulty seeing, even when wearing glasses/contacts?: No Does the patient have difficulty concentrating, remembering, or making decisions?: No Patient able to express need for assistance with ADLs?: Yes Does the patient have difficulty dressing or bathing?: No Independently performs ADLs?: Yes (appropriate for developmental age) Does the patient have difficulty walking or climbing stairs?: No Weakness of Legs: None Weakness of Arms/Hands: None  Permission Sought/Granted Permission sought to share information with : Family Supports Permission granted to share information with : Yes, Verbal Permission Granted  Share Information with NAME: daughter, Jacquelynn Cree @ [phone]           Emotional Assessment Appearance:: Appears stated age Attitude/Demeanor/Rapport: Gracious, Engaged Affect (typically observed): Accepting Orientation: : Oriented to Self, Oriented to Place, Oriented to  Time, Oriented to Situation Alcohol / Substance Use: Not Applicable Psych Involvement: No (comment)  Admission diagnosis:  Closed fracture of left hip, initial encounter (HCC) [S72.002A] Closed left hip fracture, initial encounter [facility]) [S72.002A] Patient Active Problem List   Diagnosis Date Noted   Closed left hip fracture, initial encounter (HCC) 03/21/2023   Prolonged QT interval 03/21/2023   Paroxysmal SVT (supraventricular tachycardia) 03/21/2023   Paroxysmal atrial fibrillation (HCC) 03/21/2023   Grade II diastolic dysfunction 03/21/2023   CKD (chronic  kidney disease) stage 3, GFR 30-59 ml/min (HCC) 09/07/2020   Hypertension 09/07/2019   Esophageal stenosis 09/01/2019   Cough 09/04/2017   Family history of diabetes mellitus (DM) 08/28/2016   Hyperglycemia 08/28/2016   Osteoporosis    Pharyngoesophageal dysphagia 08/28/2013   Arthritis    HLD (hyperlipidemia)    Unifocal PVCs 06/21/2011   Mitral valve prolapse 06/21/2011    Parathyroid adenoma 06/21/2011   PCP:  Pincus Sanes, MD Pharmacy:   [facility] [address] [place] - [address] [place] [address] Phone: [phone] Fax: [phone]     Social Determinants of Health (SDOH) Social History: SDOH Screenings   Food Insecurity: No Food Insecurity (03/21/2023)  Housing: Low Risk  (03/21/2023)  Transportation Needs: No Transportation Needs (03/21/2023)  Utilities: Not At Risk (03/21/2023)  Alcohol Screen: Low Risk  (06/16/2022)  Depression (PHQ2-9): Low Risk  (09/13/2022)  Financial Resource Strain: Low Risk  (06/16/2022)  Physical Activity: Sufficiently Active (06/16/2022)  Social Connections: Moderately Integrated (06/16/2022)  Stress: No Stress Concern Present (06/16/2022)  Tobacco Use: Low Risk  (03/23/2023)   SDOH Interventions:     Readmission Risk Interventions    03/23/2023    2:23 PM  Readmission Risk Prevention Plan  Post Dischage Appt Complete  Medication Screening Complete  Transportation Screening Complete

## 2023-03-23 NOTE — Progress Notes (Signed)
[facility] PROGRESS NOTE    Progress Note  Michelle Vazquez  ZOX:[id number] DOB: 1929/06/15 DOA: 03/21/2023 PCP: Pincus Sanes, MD     Brief Narrative:   Michelle Vazquez is an 89 y.o. female past medical history significant for osteoarthritis, chronic atrial fibrillation, with paroxysmal SVT not on anticoagulation, osteoporosis and history of squamous cell cancer comes in after mechanical fall on the left hip developing immediate pain and inability to bear weight left hip x-ray shows subcapital left femur fracture   Assessment/Plan:   Closed left hip fracture, initial encounter (HCC) Continue narcotics for analgesics. Orthopedic surgery has been consulted recommended n.p.o. and surgical intervention on 03/23/2023. PT OT has been consulted for postsurgical evaluation anticipate she will need skilled nursing facility. Narcotics and DVT prophylaxis per orthopedic surgery. Currently on IV fluids.  Prolonged QTc/unifocal PVCs/paroxysmal SVT: With a chads Vascor of at least 6.  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 Continue flecainide not on anticoagulation. Currently on IV fluids with potassium supplementation, try to keep potassium greater than 4 magnesium greater than 2. Basic metabolic panels pending this morning.    Heart failure grade 2 diastolic dysfunction Appears euvolemic.  Hyperlipidemia: Noted.  Hyperglycemia: Likely reactive.  Essential hypertension: Not on any antihypertensive medications at home.  Chronic kidney disease stage IIIa: At baseline.  Esophageal motility/severe esophageal stricture: Was able to take a diet resume dysphagia 3 diet with Ensure. She is followed at [facility] for this.   DVT prophylaxis: lovenox Family Communication:non Status is: Inpatient Remains inpatient appropriate because: Acute left hip fracture will probably need skilled nursing  facility    Code Status:     Code Status Orders  (From admission, onward)           Start     Ordered   03/21/23 1645  Full code  Continuous       Question:  By:  Answer:  Consent: discussion documented in EHR   03/21/23 1647           Code Status History     Date Active Date Inactive Code Status Order ID Comments User Context   03/21/2023 1647 03/21/2023 1647 Full Code [id number]  Bobette Mo, MD ED      Advance Directive Documentation    Flowsheet Row Most Recent Value  Type of Advance Directive Healthcare Power of Attorney  Pre-existing out of facility DNR order (yellow form or pink MOST form) --  "MOST" Form in Place? --         IV Access:   Peripheral IV   Procedures and diagnostic studies:   ECHOCARDIOGRAM COMPLETE  Result Date: 03/22/2023    ECHOCARDIOGRAM REPORT   Patient Name:   Michelle Vazquez Date of Exam: 03/22/2023 Medical Rec #:  [id number]     Height:       61.0 in Accession #:    [id number]    Weight:       100.0 lb Date of Birth:  1929/05/11    BSA:          1.407 m Patient Age:    89 years      BP:           121/73 mmHg Patient Gender: F             HR:           81 bpm. Exam Location:  Inpatient Procedure: 2D Echo,  Cardiac Doppler and Color Doppler Indications:    I34.8 Other nonrheumatic mitral valve disorders; I48.91*                 Unspeicified atrial fibrillation; preoperative evaluation  History:        Patient has prior history of Echocardiogram examinations. Mitral                 Valve Prolapse, Arrythmias:PVC; Risk Factors:Hypertension.  Sonographer:    Mike Gip Referring Phys: [id number] DAVID MANUEL ORTIZ IMPRESSIONS  1. Left ventricular ejection fraction, by estimation, is 55 to 60%. The left ventricle has normal function. The left ventricle has no regional wall motion abnormalities. Left ventricular diastolic parameters are consistent with Grade II diastolic dysfunction (pseudonormalization). Elevated left ventricular  end-diastolic pressure.  2. Right ventricular systolic function is normal. The right ventricular size is mildly enlarged. There is normal pulmonary artery systolic pressure. The estimated right ventricular systolic pressure is 33.7 mmHg.  3. Left atrial size was severely dilated.  4. The mitral valve is myxomatous. Mild mitral valve regurgitation. No evidence of mitral stenosis. There is mild holosystolic prolapse of the middle scallop of the posterior leaflet of the mitral valve.  5. The aortic valve is normal in structure. Aortic valve regurgitation is mild. No aortic stenosis is present. Aortic regurgitation PHT measures 469 msec.  6. The inferior vena cava is normal in size with greater than 50% respiratory variability, suggesting right atrial pressure of 3 mmHg. FINDINGS  Left Ventricle: Left ventricular ejection fraction, by estimation, is 55 to 60%. The left ventricle has normal function. The left ventricle has no regional wall motion abnormalities. The left ventricular internal cavity size was normal in size. There is  no left ventricular hypertrophy. Left ventricular diastolic parameters are consistent with Grade II diastolic dysfunction (pseudonormalization). Elevated left ventricular end-diastolic pressure. Right Ventricle: The right ventricular size is mildly enlarged. No increase in right ventricular wall thickness. Right ventricular systolic function is normal. There is normal pulmonary artery systolic pressure. The tricuspid regurgitant velocity is 2.77  m/s, and with an assumed right atrial pressure of 3 mmHg, the estimated right ventricular systolic pressure is 33.7 mmHg. Left Atrium: Left atrial size was severely dilated. Right Atrium: Right atrial size was normal in size. Pericardium: There is no evidence of pericardial effusion. Mitral Valve: The mitral valve is myxomatous. There is mild holosystolic prolapse of the middle scallop of the posterior leaflet of the mitral valve. Mild mitral valve  regurgitation. No evidence of mitral valve stenosis. Tricuspid Valve: The tricuspid valve is normal in structure. Tricuspid valve regurgitation is trivial. No evidence of tricuspid stenosis. Aortic Valve: The aortic valve is normal in structure. Aortic valve regurgitation is mild. Aortic regurgitation PHT measures 469 msec. No aortic stenosis is present. Pulmonic Valve: The pulmonic valve was normal in structure. Pulmonic valve regurgitation is not visualized. No evidence of pulmonic stenosis. Aorta: The aortic root is normal in size and structure. Venous: The inferior vena cava is normal in size with greater than 50% respiratory variability, suggesting right atrial pressure of 3 mmHg. IAS/Shunts: No atrial level shunt detected by color flow Doppler.  LEFT VENTRICLE PLAX 2D LVIDd:         3.10 cm     Diastology LVIDs:         2.20 cm     LV e' medial:    5.98 cm/s LV PW:         1.10 cm     LV  E/e' medial:  16.6 LV IVS:        1.00 cm     LV e' lateral:   7.94 cm/s LVOT diam:     1.80 cm     LV E/e' lateral: 12.5 LV SV:         35 LV SV Index:   25 LVOT Area:     2.54 cm  LV Volumes (MOD) LV vol d, MOD A2C: 66.0 ml LV vol d, MOD A4C: 61.3 ml LV vol s, MOD A2C: 26.0 ml LV vol s, MOD A4C: 23.5 ml LV SV MOD A2C:     40.0 ml LV SV MOD A4C:     61.3 ml LV SV MOD BP:      38.9 ml RIGHT VENTRICLE             IVC RV Basal diam:  4.10 cm     IVC diam: 1.40 cm RV S prime:     10.90 cm/s TAPSE (M-mode): 2.2 cm LEFT ATRIUM             Index        RIGHT ATRIUM           Index LA diam:        2.40 cm 1.71 cm/m   RA Area:     14.50 cm LA Vol (A2C):   66.1 ml 46.99 ml/m  RA Volume:   35.50 ml  25.23 ml/m LA Vol (A4C):   62.0 ml 44.07 ml/m LA Biplane Vol: 65.4 ml 46.49 ml/m  AORTIC VALVE LVOT Vmax:   70.50 cm/s LVOT Vmean:  48.900 cm/s LVOT VTI:    0.139 m AI PHT:      469 msec  AORTA Ao Root diam: 2.90 cm Ao Asc diam:  2.50 cm MITRAL VALVE               TRICUSPID VALVE MV Area (PHT): 5.13 cm    TR Peak grad:   30.7 mmHg MV  Decel Time: 148 msec    TR Vmax:        277.00 cm/s MV E velocity: 99.40 cm/s MV A velocity: 99.80 cm/s  SHUNTS MV E/A ratio:  1.00        Systemic VTI:  0.14 m                            Systemic Diam: 1.80 cm Armanda Magic MD Electronically signed by Armanda Magic MD Signature Date/Time: 03/22/2023/9:28:28 AM    Final    DG Hip Unilat W or Wo Pelvis 2-3 Views Left  Result Date: 03/21/2023 CLINICAL DATA:  Fall.  Left hip pain. EXAM: DG HIP (WITH OR WITHOUT PELVIS) 2-3V LEFT COMPARISON:  None Available. FINDINGS: There is subcapital impaction fracture of the left femoral neck with mild superior displacement of the distal femur. IMPRESSION: Subcapital impaction fracture of the left femoral neck. Electronically Signed   By: Larose Hires D.O.   On: 03/21/2023 15:09   DG Chest 1 View  Result Date: 03/21/2023 CLINICAL DATA:  Preop EXAM: CHEST  1 VIEW COMPARISON:  CXR 09/13/22 FINDINGS: No pleural effusion. No pneumothorax. Prominent cardiac contours. No focal airspace opacity. No radiographically displaced rib fractures. Visualized upper abdomen is unremarkable. IMPRESSION: No focal airspace opacity Electronically Signed   By: Lorenza Cambridge M.D.   On: 03/21/2023 15:09     Medical Consultants:   None.   Subjective:    Keshana Melvin  Diallo relates her pain is controlled has not had a bowel movement.  Objective:    Vitals:   03/22/23 1345 03/22/23 1839 03/22/23 2117 03/23/23 0510  BP: 137/70 137/75 (!) 146/72 (!) 151/82  Pulse: 89 94 97 89  Resp: 18 20 16 18   Temp: 98.4 F (36.9 C) 99 F (37.2 C) 99 F (37.2 C) 98.4 F (36.9 C)  TempSrc: Oral Oral Oral Oral  SpO2: 97% 95% 93% 97%  Weight:      Height:       SpO2: 97 % O2 Flow Rate (L/min): 2 L/min   Intake/Output Summary (Last 24 hours) at 03/23/2023 0757 Last data filed at 03/23/2023 0600 Gross per 24 hour  Intake 845.91 ml  Output 300 ml  Net 545.91 ml    Filed Weights   03/21/23 1335  Weight: 45.4 kg    Exam: General exam: In  no acute distress. Respiratory system: Good air movement and clear to auscultation. Cardiovascular system: S1 & S2 heard, RRR. No JVD. Gastrointestinal system: Abdomen is nondistended, soft and nontender.  Extremities: No pedal edema. Skin: No rashes, lesions or ulcers Psychiatry: Judgement and insight appear normal. Mood & affect appropriate. Data Reviewed:    Labs: Basic Metabolic Panel: Recent Labs  Lab 03/21/23 1343 03/22/23 0324  NA 137 129*  K 4.1 3.7  CL 99 97*  CO2 27 25  GLUCOSE 122* 148*  BUN 30* 23  CREATININE 0.97 0.87  CALCIUM 9.4 8.1*  MG 2.0  --     GFR Estimated Creatinine Clearance: 30.2 mL/min (by C-G formula based on SCr of 0.87 mg/dL). Liver Function Tests: Recent Labs  Lab 03/22/23 0324  AST 19  ALT 17  ALKPHOS 42  BILITOT 1.2  PROT 6.4*  ALBUMIN 3.4*    No results for input(s): "LIPASE", "AMYLASE" in the last 168 hours. No results for input(s): "AMMONIA" in the last 168 hours. Coagulation profile No results for input(s): "INR", "PROTIME" in the last 168 hours. COVID-19 Labs  No results for input(s): "DDIMER", "FERRITIN", "LDH", "CRP" in the last 72 hours.  Lab Results  Component Value Date   SARSCOV2NAA NEGATIVE 07/26/2022   SARSCOV2NAA NEGATIVE 09/15/2019    CBC: Recent Labs  Lab 03/21/23 1343 03/22/23 0324  WBC 11.8* 10.1  NEUTROABS 9.3*  --   HGB 13.7 12.9  HCT 41.5 38.3  MCV 94.5 94.3  PLT 258 211    Cardiac Enzymes: No results for input(s): "CKTOTAL", "CKMB", "CKMBINDEX", "TROPONINI" in the last 168 hours. BNP (last 3 results) No results for input(s): "PROBNP" in the last 8760 hours. CBG: No results for input(s): "GLUCAP" in the last 168 hours. D-Dimer: No results for input(s): "DDIMER" in the last 72 hours. Hgb A1c: No results for input(s): "HGBA1C" in the last 72 hours. Lipid Profile: No results for input(s): "CHOL", "HDL", "LDLCALC", "TRIG", "CHOLHDL", "LDLDIRECT" in the last 72 hours. Thyroid function  studies: No results for input(s): "TSH", "T4TOTAL", "T3FREE", "THYROIDAB" in the last 72 hours.  Invalid input(s): "FREET3" Anemia work up: No results for input(s): "VITAMINB12", "FOLATE", "FERRITIN", "TIBC", "IRON", "RETICCTPCT" in the last 72 hours. Sepsis Labs: Recent Labs  Lab 03/21/23 1343 03/22/23 0324  WBC 11.8* 10.1    Microbiology Recent Results (from the past 240 hour(s))  Surgical PCR screen     Status: None   Collection Time: 03/22/23 12:16 AM   Specimen: Nasal Mucosa; Nasal Swab  Result Value Ref Range Status   MRSA, PCR NEGATIVE NEGATIVE Final   Staphylococcus aureus  NEGATIVE NEGATIVE Final    Comment: (NOTE) The Xpert SA Assay (FDA approved for NASAL specimens in patients 39 years of age and older), is one component of a comprehensive surveillance program. It is not intended to diagnose infection nor to guide or monitor treatment. Performed at [facility], [address], [place], [place] [address]      Medications:    feeding supplement  237 mL Oral BID BM   flecainide  75 mg Oral BID   multivitamin  15 mL Oral Daily   mupirocin ointment  1 Application Nasal BID   mouth rinse  15 mL Mouth Rinse 4 times per day   polyethylene glycol  17 g Oral BID   Continuous Infusions:  0.9 % NaCl with KCl 20 mEq / L 75 mL/hr at 03/22/23 2318      LOS: 2 days   Marinda Elk  [facility]  03/23/2023, 7:57 AM

## 2023-03-23 NOTE — Progress Notes (Signed)
Patient ID: Michelle Vazquez, female   DOB: [birth year], 89 y.o.   MRN: [id number] The patient and the family understand that we have recommended a left partial hip replacement to treat her displaced left hip femoral neck fracture.  She is 89 years old.  We discussed what the surgery involves as well as the risk and benefits of the surgery.  Her vital signs have been reviewed in her chart has been reviewed again.  Her and her family do wish for her to proceed with surgery.  Informed consent has been obtained and the left operative hip has been marked.

## 2023-03-23 NOTE — Transfer of Care (Signed)
Immediate Anesthesia Transfer of Care Note  Patient: Michelle Vazquez  Procedure(s) Performed: TOTAL HIP ARTHROPLASTY ANTERIOR APPROACH (Left: Hip)  Patient Location: PACU  Anesthesia Type:General  Level of Consciousness: awake, alert , and oriented  Airway & Oxygen Therapy: Patient Spontanous Breathing and Patient connected to face mask oxygen  Post-op Assessment: Report given to RN and Post -op Vital signs reviewed and stable  Post vital signs: Reviewed and stable  Last Vitals:  Vitals Value Taken Time  BP    Temp    Pulse 97 03/23/23 1806  Resp 19 03/23/23 1806  SpO2 100 % 03/23/23 1806  Vitals shown include unvalidated device data.  Last Pain:  Vitals:   03/23/23 1356  TempSrc: Oral  PainSc: 0-No pain      Patients Stated Pain Goal: 3 (03/22/23 2032)  Complications: No notable events documented.

## 2023-03-23 NOTE — Anesthesia Preprocedure Evaluation (Signed)
Anesthesia Evaluation  Patient identified by MRN, date of birth, ID band Patient awake    Reviewed: Allergy & Precautions, NPO status , Patient's Chart, lab work & pertinent test results  Airway Mallampati: II  TM Distance: >3 FB Neck ROM: Full    Dental   Pulmonary neg pulmonary ROS   breath sounds clear to auscultation       Cardiovascular hypertension, + dysrhythmias Atrial Fibrillation  Rhythm:Regular Rate:Normal     Neuro/Psych negative neurological ROS     GI/Hepatic negative GI ROS, Neg liver ROS,,,  Endo/Other  negative endocrine ROS    Renal/GU negative Renal ROS     Musculoskeletal  (+) Arthritis ,    Abdominal   Peds  Hematology negative hematology ROS (+)   Anesthesia Other Findings   Reproductive/Obstetrics                             Anesthesia Physical Anesthesia Plan  ASA: 3  Anesthesia Plan: General   Post-op Pain Management: Tylenol PO (pre-op)*   Induction: Intravenous  PONV Risk Score and Plan: 3 and Dexamethasone, Ondansetron and Treatment may vary due to age or medical condition  Airway Management Planned: Oral ETT  Additional Equipment:   Intra-op Plan:   Post-operative Plan: Extubation in OR  Informed Consent: I have reviewed the patients History and Physical, chart, labs and discussed the procedure including the risks, benefits and alternatives for the proposed anesthesia with the patient or authorized representative who has indicated his/her understanding and acceptance.     Dental advisory given  Plan Discussed with: CRNA  Anesthesia Plan Comments:        Anesthesia Quick Evaluation

## 2023-03-23 NOTE — Op Note (Signed)
Operative Note  Date of operation: 03/23/2023 Preoperative diagnosis: Displaced left hip femoral neck fracture Postoperative diagnosis: Same  Procedure: Left direct anterior hip hemiarthroplasty  Implants: Implant Name Type Inv. Item Serial No. Manufacturer Lot No. LRB No. Used Action  STEM CORAIL KA12 - [id number] Stem STEM CORAIL KA12  [employer] [id number] Left 1 Implanted  BIPOLAR PROS AML 44 - [id number] Hips BIPOLAR PROS AML 44  [employer]PU[employer] [id number] Left 1 Implanted  HEAD FEM STD 28X+1.5 STRL - [id number] Hips HEAD FEM STD 28X+1.5 STRL  [employer] [id number] Left 1 Implanted   Surgeon: Vanita Panda. Magnus Ivan, MD Assistant: Rexene Edison, PA-C  Anesthesia: General EBL: 150 cc Antibiotics: 2 g IV Ancef Complications: None  Indications: The patient is a 89 year old female who sustained a mechanical fall earlier this week injuring her left hip.  She was found to have a displaced left hip femoral neck fracture.  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  She was admitted graciously to the medicine service and has been medically optimized for the surgery.  I have spoken to her and her family numbers.  We talked about the risk of acute blood loss anemia, nerve or vessel injury, fracture, infection, DVT, dislocation, implant failure, leg length differences and wound healing issues.  They understand her goals are hopefully improve mobility, improve quality of life and decrease pain.  Procedure description: After informed consent was obtained and the appropriate left hip was marked, the patient was brought to the operating room where general anesthesia was obtained while she is on a stretcher.  Traction boots were placed on both her feet and she was placed supine on the Hana fracture table with a perineal post in place in both legs and inline skeletal traction vices no traction applied.  Her left operative hip and  pelvis were assessed radiographically.  The left hip was then prepped and draped with DuraPrep and sterile drapes.  A timeout was called and she was then applied as a correct patient correct left hip.  Incision was then made just inferior and posterior to the ASIS and carried slightly obliquely down the leg.  Dissection was carried down the tensor fascia lata muscle and the tensor fascia was then divided longitudinally to proceed with direct interposed the hip.  Circumflex vessels were identified and cauterized and hip capsule identified and opened up in L-type format finding a moderate hemarthrosis consistent with a femoral neck fracture.  Right away could see there was displaced left hip femoral neck fracture.  Cobra retractors were placed around the medial lateral femoral neck and a femoral neck cut was made just distal to the fracture and proximal to the lesser trochanter with an oscillating saw and completed with an osteotome.  A corkscrew guide was placed in the femoral head and the femoral head was removed in its entirety.  We then trialed this for a size 44 bipolar hip ball.  We then turned attention the femur.  With the left leg externally rotated to 120 degrees, extended and adducted, a Mueller retractor was placed medially and a Hohmann retractor by the greater trochanter.  The lateral joint capsule was released and a box cutting osteotome was used into the femoral canal.  Broaching was then initiated using the DePuy Corail broaching system from a size 8 going up to a size 12.  With a size 12 in place we trialed our 44 bipolar with a 28+1.5 hip ball  within the 44 head.  The left leg was brought over and up and with traction and internal rotation reduced in the pelvis.  We assessed it radiographically and clinically and we are pleased with stability.  The hip was then dislocated and remove the trial components.  We placed the real size 12 DePuy femoral component with standard offset and the real 44/28+1.5  bipolar metal hip ball.  Again this was reduced and acetabulum and we are pleased with leg length, offset, range of motion and stability assessed radiographically mechanically.  Was most most important stability.  The soft tissue was then irrigated normal saline solution.  The joint capsule was closed with interrupted #1 Ethibond suture followed by #1 Vicryl close the tensor fascia.  0 Vicryl is used to close deep tissue and 2-0 Vicryl is used to close subcutaneous tissue.  The skin was closed with staples.  An Aquacel dressing was applied.  The patient was taken off the Hana table, awakened, extubated and taken recovery in stable addition.  Rexene Edison, PA-C assisted during the entire case and beginning to end and his assistance was medically necessary and crucial for soft tissue management and retraction, helping guide implant placement and a layered closure of the wound.

## 2023-03-23 NOTE — Anesthesia Procedure Notes (Signed)
Procedure Name: Intubation Date/Time: 03/23/2023 4:38 PM  Performed by: Johnette Abraham, CRNAPre-anesthesia Checklist: Patient identified, Emergency Drugs available, Suction available and Patient being monitored Patient Re-evaluated:Patient Re-evaluated prior to induction Oxygen Delivery Method: Circle System Utilized Preoxygenation: Pre-oxygenation with 100% oxygen Induction Type: IV induction Ventilation: Mask ventilation without difficulty Laryngoscope Size: Mac and 3 Grade View: Grade I Tube type: Oral Tube size: 6.5 mm Number of attempts: 1 Airway Equipment and Method: Stylet and Oral airway Placement Confirmation: ETT inserted through vocal cords under direct vision, positive ETCO2 and breath sounds checked- equal and bilateral Secured at: 20 cm Tube secured with: Tape Dental Injury: Teeth and Oropharynx as per pre-operative assessment

## 2023-03-24 DIAGNOSIS — S72002A Fracture of unspecified part of neck of left femur, initial encounter for closed fracture: Secondary | ICD-10-CM | POA: Diagnosis not present

## 2023-03-24 DIAGNOSIS — K222 Esophageal obstruction: Secondary | ICD-10-CM | POA: Diagnosis not present

## 2023-03-24 DIAGNOSIS — N1831 Chronic kidney disease, stage 3a: Secondary | ICD-10-CM | POA: Diagnosis not present

## 2023-03-24 LAB — BASIC METABOLIC PANEL
Anion gap: 9 (ref 5–15)
BUN: 25 mg/dL — ABNORMAL HIGH (ref 8–23)
CO2: 24 mmol/L (ref 22–32)
Calcium: 7.8 mg/dL — ABNORMAL LOW (ref 8.9–10.3)
Chloride: 102 mmol/L (ref 98–111)
Creatinine, Ser: 0.9 mg/dL (ref 0.44–1.00)
GFR, Estimated: >60 mL/min (ref >60)
Glucose, Bld: 142 mg/dL — ABNORMAL HIGH (ref 70–99)
Potassium: 4.2 mmol/L (ref 3.5–5.1)
Sodium: 135 mmol/L (ref 135–145)

## 2023-03-24 LAB — CBC
HCT: 36 % (ref 36.0–46.0)
Hemoglobin: 11.3 g/dL — ABNORMAL LOW (ref 12.0–15.0)
MCH: 30.6 pg (ref 26.0–34.0)
MCHC: 31.4 g/dL (ref 30.0–36.0)
MCV: 97.6 fL (ref 80.0–100.0)
Platelets: 156 10*3/uL (ref 150–400)
RBC: 3.69 MIL/uL — ABNORMAL LOW (ref 3.87–5.11)
RDW: 12.4 % (ref 11.5–15.5)
WBC: 7.6 10*3/uL (ref 4.0–10.5)
nRBC: 0 % (ref 0.0–0.2)

## 2023-03-24 MED ORDER — UNJURY PLANTED PROTEIN POWDER
2.0000 [oz_av] | Freq: Four times a day (QID) | ORAL | Status: DC
  Administered 2023-03-24 – 2023-03-26 (×4): 2 [oz_av] via ORAL
  Filled 2023-03-24 (×14): qty 25

## 2023-03-24 MED ORDER — SODIUM CHLORIDE 0.9 % IV BOLUS
500.0000 mL | Freq: Once | INTRAVENOUS | Status: AC
  Administered 2023-03-24: 500 mL via INTRAVENOUS

## 2023-03-24 MED ORDER — DOCUSATE SODIUM 50 MG/5ML PO LIQD
100.0000 mg | Freq: Two times a day (BID) | ORAL | Status: DC
  Administered 2023-03-24 – 2023-03-26 (×5): 100 mg via ORAL
  Filled 2023-03-24 (×6): qty 10

## 2023-03-24 MED ORDER — POLYETHYLENE GLYCOL 3350 17 G PO PACK
17.0000 g | PACK | Freq: Every day | ORAL | Status: AC
  Administered 2023-03-24 – 2023-03-25 (×2): 17 g via ORAL
  Filled 2023-03-24 (×2): qty 1

## 2023-03-24 NOTE — Progress Notes (Addendum)
[facility] PROGRESS NOTE    Progress Note  Michelle Vazquez  WUJ:[id number] DOB: 12/08/1928 DOA: 03/21/2023 PCP: Pincus Sanes, MD     Brief Narrative:   Michelle Vazquez is an 89 y.o. female past medical history significant for osteoarthritis, chronic atrial fibrillation, with paroxysmal SVT not on anticoagulation, osteoporosis and history of squamous cell cancer comes in after mechanical fall on the left hip developing immediate pain and inability to bear weight left hip x-ray shows subcapital left femur fracture   Assessment/Plan:   Closed left hip fracture, initial encounter [facility]) Orthopedic surgery recommended surgical intervention on 03/23/2023 left hemiarthroplasty PT OT has been consulted she will like to avoid skilled nursing facility, she has good support at home. Narcotics and DVT prophylaxis per orthopedic surgery. KVO IV fluids tolerating diet. Out of bed to chair.  Prolonged QTc/unifocal PVCs/paroxysmal SVT: With a chads Vascor of at least 6.  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 Continue flecainide not on anticoagulation. Tacky potassium greater than 4 magnesium greater than 2.    Chronic Heart failure grade 2 diastolic dysfunction Appears euvolemic.  Hyperlipidemia: Noted.  Hyperglycemia: Likely reactive.  Essential hypertension: Not on any antihypertensive medications at home.  Chronic kidney disease stage IIIa: At baseline.  Esophageal motility/severe esophageal stricture: Was able to take a diet resume dysphagia 3 diet with Ensure. She is followed at [facility] for this. Continue Ensure and protein powder   DVT prophylaxis: lovenox Family Communication:non Status is: Inpatient Remains inpatient appropriate because: Acute left hip fracture will probably need skilled nursing facility    Code Status:     Code Status Orders  (From admission, onward)            Start     Ordered   03/21/23 1645  Full code  Continuous       Question:  By:  Answer:  Consent: discussion documented in EHR   03/21/23 1647           Code Status History     Date Active Date Inactive Code Status Order ID Comments User Context   03/21/2023 1647 03/21/2023 1647 Full Code [id number]  Bobette Mo, MD ED      Advance Directive Documentation    Flowsheet Row Most Recent Value  Type of Advance Directive Healthcare Power of Attorney  Pre-existing out of facility DNR order (yellow form or pink MOST form) --  "MOST" Form in Place? --         IV Access:   Peripheral IV   Procedures and diagnostic studies:   DG Pelvis Portable  Result Date: 03/23/2023 CLINICAL DATA:  Left hip fracture status post hemiarthroplasty EXAM: PORTABLE PELVIS 1-2 VIEWS COMPARISON:  03/23/2023, 03/21/2023 FINDINGS: Frontal view of the lower pelvis and bilateral hips was obtained. Left hip hemiarthroplasty is identified in the expected position without signs of acute complication. Right hip is stable. Postsurgical changes in the soft tissues overlying the left hip. IMPRESSION: 1. Unremarkable left hip hemiarthroplasty. Electronically Signed   By: Sharlet Salina M.D.   On: 03/23/2023 18:32   DG HIP UNILAT WITH PELVIS 1V LEFT  Result Date: 03/23/2023 CLINICAL DATA:  Intraoperative fluoroscopy for left hip arthroplasty. EXAM: DG HIP (WITH OR WITHOUT PELVIS) 1V*L* COMPARISON:  Pelvis and left hip radiographs 03/21/2023 FINDINGS: Images were performed intraoperatively without the presence of a radiologist. Interval placement of bipolar left hip hemiarthroplasty. No hardware complication is seen.  Total fluoroscopy images: 3 Total fluoroscopy time: 12 seconds Total dose: Radiation Exposure Index (as provided by the fluoroscopic device): 0.923 mGy air Kerma Please see intraoperative findings for further detail. IMPRESSION: Intraoperative fluoroscopy for left hip hemiarthroplasty.  Electronically Signed   By: Neita Garnet M.D.   On: 03/23/2023 18:20   DG C-Arm 1-60 Min-No Report  Result Date: 03/23/2023 Fluoroscopy was utilized by the requesting physician.  No radiographic interpretation.   ECHOCARDIOGRAM COMPLETE  Result Date: 03/22/2023    ECHOCARDIOGRAM REPORT   Patient Name:   Michelle Vazquez Date of Exam: 03/22/2023 Medical Rec #:  [id number]     Height:       61.0 in Accession #:    [id number]    Weight:       100.0 lb Date of Birth:  09-02-1929    BSA:          1.407 m Patient Age:    89 years      BP:           121/73 mmHg Patient Gender: F             HR:           81 bpm. Exam Location:  Inpatient Procedure: 2D Echo, Cardiac Doppler and Color Doppler Indications:    I34.8 Other nonrheumatic mitral valve disorders; I48.91*                 Unspeicified atrial fibrillation; preoperative evaluation  History:        Patient has prior history of Echocardiogram examinations. Mitral                 Valve Prolapse, Arrythmias:PVC; Risk Factors:Hypertension.  Sonographer:    Mike Gip Referring Phys: [id number] DAVID MANUEL ORTIZ IMPRESSIONS  1. Left ventricular ejection fraction, by estimation, is 55 to 60%. The left ventricle has normal function. The left ventricle has no regional wall motion abnormalities. Left ventricular diastolic parameters are consistent with Grade II diastolic dysfunction (pseudonormalization). Elevated left ventricular end-diastolic pressure.  2. Right ventricular systolic function is normal. The right ventricular size is mildly enlarged. There is normal pulmonary artery systolic pressure. The estimated right ventricular systolic pressure is 33.7 mmHg.  3. Left atrial size was severely dilated.  4. The mitral valve is myxomatous. Mild mitral valve regurgitation. No evidence of mitral stenosis. There is mild holosystolic prolapse of the middle scallop of the posterior leaflet of the mitral valve.  5. The aortic valve is normal in structure. Aortic valve  regurgitation is mild. No aortic stenosis is present. Aortic regurgitation PHT measures 469 msec.  6. The inferior vena cava is normal in size with greater than 50% respiratory variability, suggesting right atrial pressure of 3 mmHg. FINDINGS  Left Ventricle: Left ventricular ejection fraction, by estimation, is 55 to 60%. The left ventricle has normal function. The left ventricle has no regional wall motion abnormalities. The left ventricular internal cavity size was normal in size. There is  no left ventricular hypertrophy. Left ventricular diastolic parameters are consistent with Grade II diastolic dysfunction (pseudonormalization). Elevated left ventricular end-diastolic pressure. Right Ventricle: The right ventricular size is mildly enlarged. No increase in right ventricular wall thickness. Right ventricular systolic function is normal. There is normal pulmonary artery systolic pressure. The tricuspid regurgitant velocity is 2.77  m/s, and with an assumed right atrial pressure of 3 mmHg, the estimated right ventricular systolic pressure is 33.7 mmHg. Left Atrium: Left atrial size was severely dilated. Right Atrium:  Right atrial size was normal in size. Pericardium: There is no evidence of pericardial effusion. Mitral Valve: The mitral valve is myxomatous. There is mild holosystolic prolapse of the middle scallop of the posterior leaflet of the mitral valve. Mild mitral valve regurgitation. No evidence of mitral valve stenosis. Tricuspid Valve: The tricuspid valve is normal in structure. Tricuspid valve regurgitation is trivial. No evidence of tricuspid stenosis. Aortic Valve: The aortic valve is normal in structure. Aortic valve regurgitation is mild. Aortic regurgitation PHT measures 469 msec. No aortic stenosis is present. Pulmonic Valve: The pulmonic valve was normal in structure. Pulmonic valve regurgitation is not visualized. No evidence of pulmonic stenosis. Aorta: The aortic root is normal in size and  structure. Venous: The inferior vena cava is normal in size with greater than 50% respiratory variability, suggesting right atrial pressure of 3 mmHg. IAS/Shunts: No atrial level shunt detected by color flow Doppler.  LEFT VENTRICLE PLAX 2D LVIDd:         3.10 cm     Diastology LVIDs:         2.20 cm     LV e' medial:    5.98 cm/s LV PW:         1.10 cm     LV E/e' medial:  16.6 LV IVS:        1.00 cm     LV e' lateral:   7.94 cm/s LVOT diam:     1.80 cm     LV E/e' lateral: 12.5 LV SV:         35 LV SV Index:   25 LVOT Area:     2.54 cm  LV Volumes (MOD) LV vol d, MOD A2C: 66.0 ml LV vol d, MOD A4C: 61.3 ml LV vol s, MOD A2C: 26.0 ml LV vol s, MOD A4C: 23.5 ml LV SV MOD A2C:     40.0 ml LV SV MOD A4C:     61.3 ml LV SV MOD BP:      38.9 ml RIGHT VENTRICLE             IVC RV Basal diam:  4.10 cm     IVC diam: 1.40 cm RV S prime:     10.90 cm/s TAPSE (M-mode): 2.2 cm LEFT ATRIUM             Index        RIGHT ATRIUM           Index LA diam:        2.40 cm 1.71 cm/m   RA Area:     14.50 cm LA Vol (A2C):   66.1 ml 46.99 ml/m  RA Volume:   35.50 ml  25.23 ml/m LA Vol (A4C):   62.0 ml 44.07 ml/m LA Biplane Vol: 65.4 ml 46.49 ml/m  AORTIC VALVE LVOT Vmax:   70.50 cm/s LVOT Vmean:  48.900 cm/s LVOT VTI:    0.139 m AI PHT:      469 msec  AORTA Ao Root diam: 2.90 cm Ao Asc diam:  2.50 cm MITRAL VALVE               TRICUSPID VALVE MV Area (PHT): 5.13 cm    TR Peak grad:   30.7 mmHg MV Decel Time: 148 msec    TR Vmax:        277.00 cm/s MV E velocity: 99.40 cm/s MV A velocity: 99.80 cm/s  SHUNTS MV E/A ratio:  1.00  Systemic VTI:  0.14 m                            Systemic Diam: 1.80 cm Armanda Magic MD Electronically signed by Armanda Magic MD Signature Date/Time: 03/22/2023/9:28:28 AM    Final      Medical Consultants:   None.   Subjective:    Michelle Vazquez pain is controlled has not had a bowel movement.  Objective:    Vitals:   03/23/23 2221 03/24/23 0121 03/24/23 0502 03/24/23 0600  BP: 104/66  121/69 109/65   Pulse: 96 90 89   Resp: 16 16 16    Temp: 97.7 F (36.5 C) (!) 97.4 F (36.3 C) (!) 97.5 F (36.4 C)   TempSrc: Oral Oral Oral   SpO2: 98% 96% 91% 94%  Weight:      Height:       SpO2: 94 % O2 Flow Rate (L/min): 2 L/min   Intake/Output Summary (Last 24 hours) at 03/24/2023 0757 Last data filed at 03/24/2023 0507 Gross per 24 hour  Intake 1954.53 ml  Output 1000 ml  Net 954.53 ml    Filed Weights   03/21/23 1335 03/23/23 1356  Weight: 45.4 kg 45.4 kg    Exam: General exam: In no acute distress. Respiratory system: Good air movement and clear to auscultation. Cardiovascular system: S1 & S2 heard, RRR. No JVD. Gastrointestinal system: Abdomen is nondistended, soft and nontender.  Extremities: No pedal edema. Skin: No rashes, lesions or ulcers Psychiatry: Judgement and insight appear normal. Mood & affect appropriate. Data Reviewed:    Labs: Basic Metabolic Panel: Recent Labs  Lab 03/21/23 1343 03/22/23 0324 03/23/23 0821 03/24/23 0312  NA 137 129* 136 135  K 4.1 3.7 4.2 4.2  CL 99 97* 105 102  CO2 27 25 25 24   GLUCOSE 122* 148* 118* 142*  BUN 30* 23 18 25*  CREATININE 0.97 0.87 0.74 0.90  CALCIUM 9.4 8.1* 8.0* 7.8*  MG 2.0  --   --   --     GFR Estimated Creatinine Clearance: 29.2 mL/min (by C-G formula based on SCr of 0.9 mg/dL). Liver Function Tests: Recent Labs  Lab 03/22/23 0324  AST 19  ALT 17  ALKPHOS 42  BILITOT 1.2  PROT 6.4*  ALBUMIN 3.4*    No results for input(s): "LIPASE", "AMYLASE" in the last 168 hours. No results for input(s): "AMMONIA" in the last 168 hours. Coagulation profile No results for input(s): "INR", "PROTIME" in the last 168 hours. COVID-19 Labs  No results for input(s): "DDIMER", "FERRITIN", "LDH", "CRP" in the last 72 hours.  Lab Results  Component Value Date   SARSCOV2NAA NEGATIVE 07/26/2022   SARSCOV2NAA NEGATIVE 09/15/2019    CBC: Recent Labs  Lab 03/21/23 1343 03/22/23 0324  03/24/23 0312  WBC 11.8* 10.1 7.6  NEUTROABS 9.3*  --   --   HGB 13.7 12.9 11.3*  HCT 41.5 38.3 36.0  MCV 94.5 94.3 97.6  PLT 258 211 156    Cardiac Enzymes: No results for input(s): "CKTOTAL", "CKMB", "CKMBINDEX", "TROPONINI" in the last 168 hours. BNP (last 3 results) No results for input(s): "PROBNP" in the last 8760 hours. CBG: No results for input(s): "GLUCAP" in the last 168 hours. D-Dimer: No results for input(s): "DDIMER" in the last 72 hours. Hgb A1c: No results for input(s): "HGBA1C" in the last 72 hours. Lipid Profile: No results for input(s): "CHOL", "HDL", "LDLCALC", "TRIG", "CHOLHDL", "LDLDIRECT" in the last  72 hours. Thyroid function studies: No results for input(s): "TSH", "T4TOTAL", "T3FREE", "THYROIDAB" in the last 72 hours.  Invalid input(s): "FREET3" Anemia work up: No results for input(s): "VITAMINB12", "FOLATE", "FERRITIN", "TIBC", "IRON", "RETICCTPCT" in the last 72 hours. Sepsis Labs: Recent Labs  Lab 03/21/23 1343 03/22/23 0324 03/24/23 0312  WBC 11.8* 10.1 7.6    Microbiology Recent Results (from the past 240 hour(s))  Surgical PCR screen     Status: None   Collection Time: 03/22/23 12:16 AM   Specimen: Nasal Mucosa; Nasal Swab  Result Value Ref Range Status   MRSA, PCR NEGATIVE NEGATIVE Final   Staphylococcus aureus NEGATIVE NEGATIVE Final    Comment: (NOTE) The Xpert SA Assay (FDA approved for NASAL specimens in patients 30 years of age and older), is one component of a comprehensive surveillance program. It is not intended to diagnose infection nor to guide or monitor treatment. Performed at [facility], [address] [place], [place] [address]      Medications:    aspirin  81 mg Oral BID   docusate sodium  100 mg Oral BID   feeding supplement  237 mL Oral BID BM   flecainide  75 mg Oral BID   multivitamin  15 mL Oral Daily   mupirocin ointment  1 Application Nasal BID   mouth rinse  15 mL Mouth Rinse 4  times per day   pantoprazole  40 mg Oral Daily   polyethylene glycol  17 g Oral BID   Continuous Infusions:  sodium chloride 20 mL/hr at 03/24/23 6045   methocarbamol (ROBAXIN) IV Stopped (03/23/23 1939)      LOS: 3 days   Marinda Elk  [facility]  03/24/2023, 7:57 AM

## 2023-03-24 NOTE — Progress Notes (Signed)
Physical Therapy Treatment Patient Details Name: Michelle Vazquez MRN: [id number] DOB: 17-Dec-1928 Today's Date: 03/24/2023   History of Present Illness 89 yo female admitted with L hip fx after falling at home. S/P L THA-DA 03/23/23. Hx of OA, Afib, PSVT, MVP, osteoporosis, facial SLL, parathyroid adenoma    PT Comments    Pt agreeable to 2nd session. RN reported BP was soft with pt in supine. Assessed orthostatic vitals during session-see flowsheet. Pt denied dizziness, nausea but c/o weakness, shakiness. Assisted back to bed at end of session. Will continue to follow and progress activity as safely able. Pt will benefit from continued rehab in appropriate setting after hospital stay.   Recommendations for follow up therapy are one component of a multi-disciplinary discharge planning process, led by the attending physician.  Recommendations may be updated based on patient status, additional functional criteria and insurance authorization.  Follow Up Recommendations  Can patient physically be transported by private vehicle: No    Assistance Recommended at Discharge Frequent or constant Supervision/Assistance  Patient can return home with the following Assist for transportation;Assistance with cooking/housework;Help with stairs or ramp for entrance;A lot of help with walking and/or transfers;A lot of help with bathing/dressing/bathroom   Equipment Recommendations  Rolling walker (2 wheels);BSC/3in1 (youth height)    Recommendations for Other Services       Precautions / Restrictions Precautions Precautions: Fall Precaution Comments: monitor O2, BP Restrictions Weight Bearing Restrictions: No LLE Weight Bearing: Weight bearing as tolerated     Mobility  Bed Mobility Overal bed mobility: Needs Assistance Bed Mobility: Supine to Sit     Supine to sit: Mod assist, HOB elevated Sit to supine: Mod assist, HOB elevated   General bed mobility comments: Assist for trunk and L LE.  Utilized bedpad to aid with scooting, positioning at EOB. Increased time. Cues for safety, technique, sequencing. Pt relied on bedrail    Transfers Overall transfer level: Needs assistance Equipment used: Rolling walker (2 wheels) Transfers: Sit to/from Stand Sit to Stand: Mod assist, From elevated surface          Lateral/Scoot Transfers: Min assist General transfer comment: Assist to power up, stabilize, control descent. Cues for safety, technique, hand/LE placement, proper use of RW. Increased time. Moderate posterior bias.  Deferred any further mobility for safety-low BP-70/52. Lateral scoots along edge of bed before assisting pt back to bed.    Ambulation/Gait               General Gait Details: Not able this session   Stairs             Wheelchair Mobility    Modified Rankin (Stroke Patients Only)       Balance Overall balance assessment: Needs assistance, History of Falls         Standing balance support: Bilateral upper extremity supported, Reliant on assistive device for balance, During functional activity Standing balance-Leahy Scale: Poor                              Cognition Arousal/Alertness: Awake/alert Behavior During Therapy: WFL for tasks assessed/performed Overall Cognitive Status: Within Functional Limits for tasks assessed                                 General Comments: tends to defer to family        Exercises  General Comments        Pertinent Vitals/Pain Pain Assessment Pain Assessment: 0-10 Pain Score: 5  Faces Pain Scale: Hurts even more Pain Location: L hip/thigh Pain Descriptors / Indicators: Operative site guarding, Aching Pain Intervention(s): Limited activity within patient's tolerance, Monitored during session, Repositioned    Home Living Family/patient expects to be discharged to:: Unsure Living Arrangements: Other relatives (granddaughter-works) Available Help at  Discharge: Family Type of Home: House Home Access: Stairs to enter Entrance Stairs-Rails: Right Entrance Stairs-Number of Steps: 4-5   Home Layout: One level Home Equipment: None      Prior Function            PT Goals (current goals can now be found in the care plan section) Acute Rehab PT Goals Patient Stated Goal: regain PLOF/independence PT Goal Formulation: With patient/family Time For Goal Achievement: 04/07/23 Potential to Achieve Goals: Good Progress towards PT goals: Progressing toward goals    Frequency    Min 4X/week      PT Plan Current plan remains appropriate    Co-evaluation              AM-PAC PT "6 Clicks" Mobility   Outcome Measure  Help needed turning from your back to your side while in a flat bed without using bedrails?: A Lot Help needed moving from lying on your back to sitting on the side of a flat bed without using bedrails?: A Lot Help needed moving to and from a bed to a chair (including a wheelchair)?: A Lot Help needed standing up from a chair using your arms (e.g., wheelchair or bedside chair)?: A Lot Help needed to walk in hospital room?: Total Help needed climbing 3-5 steps with a railing? : Total 6 Click Score: 10    End of Session Equipment Utilized During Treatment: Gait belt Activity Tolerance: Patient limited by fatigue;Patient limited by pain Patient left: in bed;with call bell/phone within reach;with bed alarm set;with family/visitor present   PT Visit Diagnosis: Pain Pain - Right/Left: Left Pain - part of body: Hip     Time: 1610-9604 PT Time Calculation (min) (ACUTE ONLY): 19 min  Charges:  $Therapeutic Exercise: 8-22 mins $Therapeutic Activity: 8-22 mins                         Faye Ramsay, PT Acute Rehabilitation  Office: [phone]

## 2023-03-24 NOTE — Progress Notes (Signed)
   03/24/23 1440  Vitals  Patient Position (if appropriate) Orthostatic Vitals  Orthostatic Lying   BP- Lying 94/52  Orthostatic Sitting  BP- Sitting 98/52  Orthostatic Standing at 0 minutes  BP- Standing at 0 minutes (!) 70/52   Dr. David Stall made aware, received and carried out new order. Pt now resting in bed, respirations even and unlabored, ice pack to L hip. Family at bedside updated.

## 2023-03-24 NOTE — Plan of Care (Signed)

## 2023-03-24 NOTE — Progress Notes (Signed)
Subjective: 1 Day Post-Op Procedure(s) (LRB): TOTAL HIP ARTHROPLASTY ANTERIOR APPROACH (Left) Patient reports pain as moderate.  She is awake and alert with family at the bedside.  She tolerated her surgery well late yesterday afternoon.  She is fatigued from the whole process since she did break her hip on Wednesday.  Objective: Vital signs in last 24 hours: Temp:  [97.4 F (36.3 C)-99 F (37.2 C)] 99 F (37.2 C) (05/18 1000) Pulse Rate:  [89-102] 94 (05/18 1000) Resp:  [16-25] 17 (05/18 1000) BP: (104-168)/(51-99) 111/51 (05/18 1000) SpO2:  [87 %-100 %] 99 % (05/18 1000) Weight:  [45.4 kg] 45.4 kg (05/17 1356)  Intake/Output from previous day: 05/17 0701 - 05/18 0700 In: 1954.5 [P.O.:300; I.V.:1449.5; IV Piggyback:205] Out: 1000 [Urine:850; Blood:150] Intake/Output this shift: Total I/O In: 355 [P.O.:120; I.V.:185; IV Piggyback:50] Out: 200 [Urine:200]  Recent Labs    03/21/23 1343 03/22/23 0324 03/24/23 0312  HGB 13.7 12.9 11.3*   Recent Labs    03/22/23 0324 03/24/23 0312  WBC 10.1 7.6  RBC 4.06 3.69*  HCT 38.3 36.0  PLT 211 156   Recent Labs    03/23/23 0821 03/24/23 0312  NA 136 135  K 4.2 4.2  CL 105 102  CO2 25 24  BUN 18 25*  CREATININE 0.74 0.90  GLUCOSE 118* 142*  CALCIUM 8.0* 7.8*   No results for input(s): "LABPT", "INR" in the last 72 hours.  Sensation intact distally Intact pulses distally Dorsiflexion/Plantar flexion intact Incision: scant drainage   Assessment/Plan: 1 Day Post-Op Procedure(s) (LRB): TOTAL HIP ARTHROPLASTY ANTERIOR APPROACH (Left) Up with therapy Obviously she will need assistance when she is up given her frailty.  She can weight-bear as tolerated on her left hip.  Will see how she does over the weekend from a therapy and mobility standpoint in terms of determining her disposition after this acute hospital stay.  From a DVT coverage standpoint, I would recommend just a baby aspirin twice a day unless she was on blood  thinning medication prior to surgery.  Will continue to follow her while she is here.     Kathryne Hitch 03/24/2023, 11:28 AM

## 2023-03-24 NOTE — Evaluation (Signed)
Physical Therapy Evaluation Patient Details Name: Michelle Vazquez MRN: [id number] DOB: February 21, 1929 Today's Date: 03/24/2023  History of Present Illness  89 yo female admitted with L hip fx after falling at home. S/P L THA-DA 03/23/23. Hx of OA, Afib, PSVT, MVP, osteoporosis, facial SLL, parathyroid adenoma  Clinical Impression  On eval, pt required Mod A for mobility. She was able to briefly stand at bedside with RW. She was unable to take any steps away from the bed or pivot over to the recliner this session. Moderate pain with activity. Pt c/o feeling hot and nauseous once on her feet. O2 fluctuated between 87-90% on RA during session. Encouraged pt to perform spirometer breathing every hour. Family was present during session. Discussed d/c plan-pt would like to be able to return home but she also wants to get as much therapy as she can to regain her PLOF/independence. If pt were to return home, she would require 24/7 supervision/assist. Will continue to follow and progress activity as tolerated.        Recommendations for follow up therapy are one component of a multi-disciplinary discharge planning process, led by the attending physician.  Recommendations may be updated based on patient status, additional functional criteria and insurance authorization.  Follow Up Recommendations Can patient physically be transported by private vehicle: No     Assistance Recommended at Discharge Frequent or constant Supervision/Assistance  Patient can return home with the following  Assist for transportation;Assistance with cooking/housework;Help with stairs or ramp for entrance;A lot of help with walking and/or transfers;A lot of help with bathing/dressing/bathroom    Equipment Recommendations Rolling walker (2 wheels) (youth height)  Recommendations for Other Services       Functional Status Assessment Patient has had a recent decline in their functional status and demonstrates the ability to make  significant improvements in function in a reasonable and predictable amount of time.     Precautions / Restrictions Precautions Precautions: Fall Precaution Comments: monitor O2 Restrictions Weight Bearing Restrictions: No LLE Weight Bearing: Weight bearing as tolerated      Mobility  Bed Mobility Overal bed mobility: Needs Assistance Bed Mobility: Supine to Sit, Sit to Supine     Supine to sit: Mod assist, HOB elevated Sit to supine: Mod assist, HOB elevated   General bed mobility comments: Assist for trunk and L LE. Utilized bedpad to aid with scooting, positioning at EOB. Increased time. Cues for safety, technique, sequencing. Pt relied on bedrail    Transfers Overall transfer level: Needs assistance Equipment used: Rolling walker (2 wheels) Transfers: Sit to/from Stand, Bed to chair/wheelchair/BSC Sit to Stand: Mod assist, From elevated surface          Lateral/Scoot Transfers: Min assist General transfer comment: Assist to power up, stabilize, control descent. Cues for safety, technique, hand/LE placement, proper use of RW. Increased time. Moderate posterior bias. Pt took 1 step to begin pivoting but was limited by nausea and feeling hot. Deferred any further mobility for safety. Lateral scoots along edge of bed before assisting pt back to bed.    Ambulation/Gait               General Gait Details: Not able this session  Stairs            Wheelchair Mobility    Modified Rankin (Stroke Patients Only)       Balance Overall balance assessment: Needs assistance, History of Falls         Standing balance support: Bilateral upper extremity  supported, Reliant on assistive device for balance, During functional activity Standing balance-Leahy Scale: Poor                               Pertinent Vitals/Pain Pain Assessment Pain Assessment: Faces Faces Pain Scale: Hurts even more Pain Location: L hip/thigh Pain Descriptors /  Indicators: Operative site guarding, Aching Pain Intervention(s): Limited activity within patient's tolerance, Monitored during session, Repositioned    Home Living Family/patient expects to be discharged to:: Unsure Living Arrangements: Other relatives (granddaughter-works) Available Help at Discharge: Family Type of Home: House Home Access: Stairs to enter Entrance Stairs-Rails: Right Entrance Stairs-Number of Steps: 4-5   Home Layout: One level Home Equipment: None      Prior Function Prior Level of Function : Independent/Modified Independent                     Hand Dominance        Extremity/Trunk Assessment   Upper Extremity Assessment Upper Extremity Assessment: Defer to OT evaluation    Lower Extremity Assessment Lower Extremity Assessment: Generalized weakness (L LE edematous;poor muscle activation throughout)    Cervical / Trunk Assessment Cervical / Trunk Assessment: Kyphotic  Communication   Communication: No difficulties  Cognition Arousal/Alertness: Awake/alert Behavior During Therapy: WFL for tasks assessed/performed Overall Cognitive Status: Within Functional Limits for tasks assessed                                 General Comments: tends to defer to family        General Comments      Exercises Total Joint Exercises Ankle Circles/Pumps: AROM, Both, 10 reps Quad Sets: AROM, Both, 10 reps Heel Slides: AAROM, Left, 5 reps Hip ABduction/ADduction: AAROM, Left, 10 reps   Assessment/Plan    PT Assessment Patient needs continued PT services  PT Problem List Decreased strength;Decreased range of motion;Decreased activity tolerance;Decreased balance;Decreased mobility;Decreased knowledge of use of DME;Pain       PT Treatment Interventions DME instruction;Gait training;Therapeutic exercise;Balance training;Functional mobility training;Therapeutic activities;Patient/family education    PT Goals (Current goals can be found  in the Care Plan section)  Acute Rehab PT Goals Patient Stated Goal: regain PLOF/independence PT Goal Formulation: With patient/family Time For Goal Achievement: 04/07/23 Potential to Achieve Goals: Good    Frequency Min 4X/week     Co-evaluation               AM-PAC PT "6 Clicks" Mobility  Outcome Measure Help needed turning from your back to your side while in a flat bed without using bedrails?: A Lot Help needed moving from lying on your back to sitting on the side of a flat bed without using bedrails?: A Lot Help needed moving to and from a bed to a chair (including a wheelchair)?: A Lot Help needed standing up from a chair using your arms (e.g., wheelchair or bedside chair)?: A Lot Help needed to walk in hospital room?: Total Help needed climbing 3-5 steps with a railing? : Total 6 Click Score: 10    End of Session Equipment Utilized During Treatment: Gait belt Activity Tolerance: Patient limited by fatigue;Patient limited by pain Patient left: in bed;with call bell/phone within reach;with family/visitor present   PT Visit Diagnosis: Pain Pain - Right/Left: Left Pain - part of body: Hip    Time: 1610-9604 PT Time Calculation (min) (ACUTE ONLY):  48 min   Charges:   PT Evaluation $PT Eval Moderate Complexity: 1 Mod PT Treatments $Therapeutic Exercise: 8-22 mins $Therapeutic Activity: 8-22 mins         Faye Ramsay, PT Acute Rehabilitation  Office: [phone]

## 2023-03-24 NOTE — Plan of Care (Signed)
  Problem: Coping: Goal: Level of anxiety will decrease Outcome: Progressing   Problem: Pain Managment: Goal: General experience of comfort will improve Outcome: Progressing   Problem: Safety: Goal: Ability to remain free from injury will improve Outcome: Progressing   

## 2023-03-24 NOTE — Progress Notes (Signed)
OT Cancellation Note  Patient Details Name: Michelle Vazquez MRN: [id number] DOB: 05/13/1929   Cancelled Treatment:    Reason Eval/Treat Not Completed: Patient not medically ready: Per interdisciplinary communication with Nursing and Physical Therapy, OT advised to hold Evaluation today as pt not able to actively participate with ADL education today. Will check back tomorrow as able.   Theodoro Clock 03/24/2023, 12:26 PM

## 2023-03-24 NOTE — Discharge Instructions (Signed)

## 2023-03-25 ENCOUNTER — Inpatient Hospital Stay (HOSPITAL_COMMUNITY): Payer: Medicare Other

## 2023-03-25 DIAGNOSIS — N1831 Chronic kidney disease, stage 3a: Secondary | ICD-10-CM | POA: Diagnosis not present

## 2023-03-25 DIAGNOSIS — K222 Esophageal obstruction: Secondary | ICD-10-CM | POA: Diagnosis not present

## 2023-03-25 DIAGNOSIS — S72002A Fracture of unspecified part of neck of left femur, initial encounter for closed fracture: Secondary | ICD-10-CM | POA: Diagnosis not present

## 2023-03-25 LAB — CBC
HCT: 30.1 % — ABNORMAL LOW (ref 36.0–46.0)
Hemoglobin: 9.8 g/dL — ABNORMAL LOW (ref 12.0–15.0)
MCH: 31.5 pg (ref 26.0–34.0)
MCHC: 32.6 g/dL (ref 30.0–36.0)
MCV: 96.8 fL (ref 80.0–100.0)
Platelets: 152 10*3/uL (ref 150–400)
RBC: 3.11 MIL/uL — ABNORMAL LOW (ref 3.87–5.11)
RDW: 12.8 % (ref 11.5–15.5)
WBC: 7.7 10*3/uL (ref 4.0–10.5)
nRBC: 0 % (ref 0.0–0.2)

## 2023-03-25 MED ORDER — SODIUM CHLORIDE 0.9 % IV SOLN
1.5000 g | Freq: Two times a day (BID) | INTRAVENOUS | Status: DC
  Administered 2023-03-25 – 2023-03-26 (×4): 1.5 g via INTRAVENOUS
  Filled 2023-03-25 (×6): qty 4

## 2023-03-25 MED ORDER — SODIUM CHLORIDE 0.9 % IV SOLN: 1.5000 g | Freq: Four times a day (QID) | INTRAVENOUS | Status: DC

## 2023-03-25 MED ORDER — PANTOPRAZOLE SODIUM 40 MG IV SOLR
40.0000 mg | Freq: Two times a day (BID) | INTRAVENOUS | Status: DC
  Administered 2023-03-25 – 2023-03-27 (×4): 40 mg via INTRAVENOUS
  Filled 2023-03-25 (×4): qty 10

## 2023-03-25 NOTE — Progress Notes (Signed)
Patient transferred from chair to bed. O2 95-96% on 1 liter while sitting in chair. During transfer O2 84-86% on 1 liter. While resting in bed O2 95% on 1 liter. Patient tolerated transfer well. Patient resting in bed. No signs of distress. Call bell at reach, bed alarm on and family at beside.

## 2023-03-25 NOTE — Progress Notes (Addendum)
[facility] PROGRESS NOTE    Progress Note  Michelle Vazquez  ZOX:[id number] DOB: Aug 29, 1929 DOA: 03/21/2023 PCP: Pincus Sanes, MD     Brief Narrative:   Michelle Vazquez is an 89 y.o. female past medical history significant for osteoarthritis, chronic atrial fibrillation, with paroxysmal SVT not on anticoagulation, osteoporosis and history of squamous cell cancer comes in after mechanical fall on the left hip developing immediate pain and inability to bear weight left hip x-ray shows subcapital left femur fracture   Assessment/Plan:   Closed left hip fracture, initial encounter [facility]) Orthopedic surgery she is status post left hip arthroplasty 03/23/2023. PT OT has been consulted, the patient and the family have now agreed to proceed with skilled nursing facility Aspirin twice a day for DVT prophylaxis. Out of bed to chair.  Prolonged QTc/unifocal PVCs/paroxysmal SVT: With a chads Vascor of at least 6.  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 Continue flecainide not on anticoagulation. Try to keep potassium greater than 4 magnesium greater than 2.    Acute hypoxic respiratory failure with hypoxia: New oxygen requirements, she relates her cough has gotten worst.  Has a sore throat. Chest x-ray new pleural effusion. Concern about aspiration, started empirically on IV Unasyn. Given incentive spirometry and flutter valve. Check Blood culture.  Chronic Heart failure grade 2 diastolic dysfunction Appears euvolemic.  Hyperlipidemia: Noted.  Hyperglycemia: Likely reactive.  Essential hypertension: Not on any antihypertensive medications at home.  Chronic kidney disease stage IIIa: At baseline.  Esophageal motility/severe esophageal stricture: Was able to take a diet resume dysphagia 3 diet with Ensure. She is followed at [facility] for this. With persistent cough now with oxygen  requirements.   DVT prophylaxis: lovenox Family Communication:non Status is: Inpatient Remains inpatient appropriate because: Acute left hip fracture will probably need skilled nursing facility    Code Status:     Code Status Orders  (From admission, onward)           Start     Ordered   03/21/23 1645  Full code  Continuous       Question:  By:  Answer:  Consent: discussion documented in EHR   03/21/23 1647           Code Status History     Date Active Date Inactive Code Status Order ID Comments User Context   03/21/2023 1647 03/21/2023 1647 Full Code [id number]  Bobette Mo, MD ED      Advance Directive Documentation    Flowsheet Row Most Recent Value  Type of Advance Directive Healthcare Power of Attorney  Pre-existing out of facility DNR order (yellow form or pink MOST form) --  "MOST" Form in Place? --         IV Access:   Peripheral IV   Procedures and diagnostic studies:   DG Pelvis Portable  Result Date: 03/23/2023 CLINICAL DATA:  Left hip fracture status post hemiarthroplasty EXAM: PORTABLE PELVIS 1-2 VIEWS COMPARISON:  03/23/2023, 03/21/2023 FINDINGS: Frontal view of the lower pelvis and bilateral hips was obtained. Left hip hemiarthroplasty is identified in the expected position without signs of acute complication. Right hip is stable. Postsurgical changes in the soft tissues overlying the left hip. IMPRESSION: 1. Unremarkable left hip hemiarthroplasty. Electronically Signed   By: Sharlet Salina M.D.   On: 03/23/2023 18:32   DG HIP UNILAT WITH PELVIS 1V LEFT  Result Date: 03/23/2023 CLINICAL DATA:  Intraoperative  fluoroscopy for left hip arthroplasty. EXAM: DG HIP (WITH OR WITHOUT PELVIS) 1V*L* COMPARISON:  Pelvis and left hip radiographs 03/21/2023 FINDINGS: Images were performed intraoperatively without the presence of a radiologist. Interval placement of bipolar left hip hemiarthroplasty. No hardware complication is seen. Total  fluoroscopy images: 3 Total fluoroscopy time: 12 seconds Total dose: Radiation Exposure Index (as provided by the fluoroscopic device): 0.923 mGy air Kerma Please see intraoperative findings for further detail. IMPRESSION: Intraoperative fluoroscopy for left hip hemiarthroplasty. Electronically Signed   By: Neita Garnet M.D.   On: 03/23/2023 18:20   DG C-Arm 1-60 Min-No Report  Result Date: 03/23/2023 Fluoroscopy was utilized by the requesting physician.  No radiographic interpretation.     Medical Consultants:   None.   Subjective:    Michelle Vazquez relates her cough got worse after surgery.  Objective:    Vitals:   03/24/23 1426 03/24/23 1707 03/24/23 2156 03/25/23 0603  BP: (!) 94/52 (!) 117/54 118/64 134/73  Pulse: 96 88 99 87  Resp:  16 20 20   Temp:  98.3 F (36.8 C) 98 F (36.7 C) 98 F (36.7 C)  TempSrc:  Oral Oral Oral  SpO2: 91% 100% 98% 99%  Weight:      Height:       SpO2: 99 % O2 Flow Rate (L/min): 2 L/min   Intake/Output Summary (Last 24 hours) at 03/25/2023 0848 Last data filed at 03/25/2023 4540 Gross per 24 hour  Intake 1912.84 ml  Output 200 ml  Net 1712.84 ml    Filed Weights   03/21/23 1335 03/23/23 1356  Weight: 45.4 kg 45.4 kg    Exam: General exam: In no acute distress. Respiratory system: Good air movement and clear to auscultation. Cardiovascular system: S1 & S2 heard, RRR. No JVD. Gastrointestinal system: Abdomen is nondistended, soft and nontender.  Extremities: No pedal edema. Skin: No rashes, lesions or ulcers Psychiatry: Judgement and insight appear normal. Mood & affect appropriate. Data Reviewed:    Labs: Basic Metabolic Panel: Recent Labs  Lab 03/21/23 1343 03/22/23 0324 03/23/23 0821 03/24/23 0312  NA 137 129* 136 135  K 4.1 3.7 4.2 4.2  CL 99 97* 105 102  CO2 27 25 25 24   GLUCOSE 122* 148* 118* 142*  BUN 30* 23 18 25*  CREATININE 0.97 0.87 0.74 0.90  CALCIUM 9.4 8.1* 8.0* 7.8*  MG 2.0  --   --   --      GFR Estimated Creatinine Clearance: 29.2 mL/min (by C-G formula based on SCr of 0.9 mg/dL). Liver Function Tests: Recent Labs  Lab 03/22/23 0324  AST 19  ALT 17  ALKPHOS 42  BILITOT 1.2  PROT 6.4*  ALBUMIN 3.4*    No results for input(s): "LIPASE", "AMYLASE" in the last 168 hours. No results for input(s): "AMMONIA" in the last 168 hours. Coagulation profile No results for input(s): "INR", "PROTIME" in the last 168 hours. COVID-19 Labs  No results for input(s): "DDIMER", "FERRITIN", "LDH", "CRP" in the last 72 hours.  Lab Results  Component Value Date   SARSCOV2NAA NEGATIVE 07/26/2022   SARSCOV2NAA NEGATIVE 09/15/2019    CBC: Recent Labs  Lab 03/21/23 1343 03/22/23 0324 03/24/23 0312 03/25/23 0257  WBC 11.8* 10.1 7.6 7.7  NEUTROABS 9.3*  --   --   --   HGB 13.7 12.9 11.3* 9.8*  HCT 41.5 38.3 36.0 30.1*  MCV 94.5 94.3 97.6 96.8  PLT 258 211 156 152    Cardiac Enzymes: No results for input(s): "  CKTOTAL", "CKMB", "CKMBINDEX", "TROPONINI" in the last 168 hours. BNP (last 3 results) No results for input(s): "PROBNP" in the last 8760 hours. CBG: No results for input(s): "GLUCAP" in the last 168 hours. D-Dimer: No results for input(s): "DDIMER" in the last 72 hours. Hgb A1c: No results for input(s): "HGBA1C" in the last 72 hours. Lipid Profile: No results for input(s): "CHOL", "HDL", "LDLCALC", "TRIG", "CHOLHDL", "LDLDIRECT" in the last 72 hours. Thyroid function studies: No results for input(s): "TSH", "T4TOTAL", "T3FREE", "THYROIDAB" in the last 72 hours.  Invalid input(s): "FREET3" Anemia work up: No results for input(s): "VITAMINB12", "FOLATE", "FERRITIN", "TIBC", "IRON", "RETICCTPCT" in the last 72 hours. Sepsis Labs: Recent Labs  Lab 03/21/23 1343 03/22/23 0324 03/24/23 0312 03/25/23 0257  WBC 11.8* 10.1 7.6 7.7    Microbiology Recent Results (from the past 240 hour(s))  Surgical PCR screen     Status: None   Collection Time: 03/22/23  12:16 AM   Specimen: Nasal Mucosa; Nasal Swab  Result Value Ref Range Status   MRSA, PCR NEGATIVE NEGATIVE Final   Staphylococcus aureus NEGATIVE NEGATIVE Final    Comment: (NOTE) The Xpert SA Assay (FDA approved for NASAL specimens in patients 81 years of age and older), is one component of a comprehensive surveillance program. It is not intended to diagnose infection nor to guide or monitor treatment. Performed at [facility], [address], [place], [place] [address]      Medications:    aspirin  81 mg Oral BID   docusate  100 mg Oral BID   feeding supplement  237 mL Oral BID BM   flecainide  75 mg Oral BID   multivitamin  15 mL Oral Daily   mupirocin ointment  1 Application Nasal BID   mouth rinse  15 mL Mouth Rinse 4 times per day   pantoprazole  40 mg Oral Daily   polyethylene glycol  17 g Oral Daily   protein supplement  2 oz Oral QID   Continuous Infusions:  sodium chloride Stopped (03/24/23 1019)   methocarbamol (ROBAXIN) IV Stopped (03/23/23 1939)      LOS: 4 days   Marinda Elk  [facility]  03/25/2023, 8:48 AM

## 2023-03-25 NOTE — Plan of Care (Signed)

## 2023-03-25 NOTE — Progress Notes (Signed)
Patient ID: Michelle Vazquez, female   DOB: October 26, 1929, 89 y.o.   MRN: [id number] Patient is a 89 year old woman 2 days status post total hip arthroplasty for hip fracture.  Patient states she has a chronic cough from chronic strictures she states that she has been having burning pain.  She has a chest x-ray pending recommended increase fluid intake.  No complaints with her hip.

## 2023-03-25 NOTE — Anesthesia Postprocedure Evaluation (Signed)
Anesthesia Post Note  Patient: Michelle Vazquez  Procedure(s) Performed: TOTAL HIP ARTHROPLASTY ANTERIOR APPROACH (Left: Hip)     Patient location during evaluation: PACU Anesthesia Type: General Level of consciousness: awake and alert Pain management: pain level controlled Vital Signs Assessment: post-procedure vital signs reviewed and stable Respiratory status: spontaneous breathing, nonlabored ventilation, respiratory function stable and patient connected to nasal cannula oxygen Cardiovascular status: blood pressure returned to baseline and stable Postop Assessment: no apparent nausea or vomiting Anesthetic complications: no   No notable events documented.  Last Vitals:  Vitals:   03/24/23 2156 03/25/23 0603  BP: 118/64 134/73  Pulse: 99 87  Resp: 20 20  Temp: 36.7 C 36.7 C  SpO2: 98% 99%    Last Pain:  Vitals:   03/25/23 0603  TempSrc: Oral  PainSc:                  Kennieth Rad

## 2023-03-25 NOTE — Evaluation (Signed)
Occupational Therapy Evaluation Patient Details Name: Michelle Vazquez MRN: [id number] DOB: Apr 04, 1929 Today's Date: 03/25/2023   History of Present Illness 89 yo female admitted with L hip fx after falling at home. S/P L THA-DA 03/23/23. Hx of OA, Afib, PSVT, MVP, osteoporosis, facial SLL, parathyroid adenoma   Clinical Impression   [name] is a 89 year old woman who is typically independent at home and now s/p hip fracture repair. On evaluation she presents with pain, decreased ROM and strength of left lower extremity, generalized weakness and decreased activity tolerance. She is min assist for transfers and mod-max assist for LB ADLs. She reports pain in hip that is the most limiting and also has a persistent cough and on 2 L [place]. Patient will benefit from skilled OT services while in hospital to improve deficits and learn compensatory strategies as needed in order to return to PLOF.        Recommendations for follow up therapy are one component of a multi-disciplinary discharge planning process, led by the attending physician.  Recommendations may be updated based on patient status, additional functional criteria and insurance authorization.   Assistance Recommended at Discharge Frequent or constant Supervision/Assistance  Patient can return home with the following A little help with walking and/or transfers;A lot of help with bathing/dressing/bathroom;Assistance with cooking/housework;Assist for transportation;Help with stairs or ramp for entrance    Functional Status Assessment  Patient has had a recent decline in their functional status and demonstrates the ability to make significant improvements in function in a reasonable and predictable amount of time.  Equipment Recommendations  BSC/3in1    Recommendations for Other Services       Precautions / Restrictions Precautions Precautions: Fall Precaution Comments: monitor O2, BP Restrictions Weight Bearing Restrictions:  No LLE Weight Bearing: Weight bearing as tolerated      Mobility Bed Mobility Overal bed mobility: Needs Assistance Bed Mobility: Supine to Sit     Supine to sit: Min assist, HOB elevated          Transfers Overall transfer level: Needs assistance Equipment used: Rolling walker (2 wheels) Transfers: Sit to/from Stand, Bed to chair/wheelchair/BSC Sit to Stand: Min assist, From elevated surface     Step pivot transfers: Min assist     General transfer comment: Min assist with walker to take steps to recliner with verbal cues for technique.      Balance Overall balance assessment: Needs assistance Sitting-balance support: No upper extremity supported, Feet supported Sitting balance-Leahy Scale: Fair     Standing balance support: During functional activity, Reliant on assistive device for balance Standing balance-Leahy Scale: Poor                             ADL either performed or assessed with clinical judgement   ADL Overall ADL's : Needs assistance/impaired Eating/Feeding: Independent   Grooming: Set up;Sitting   Upper Body Bathing: Set up;Sitting   Lower Body Bathing: Moderate assistance;Sitting/lateral leans   Upper Body Dressing : Set up;Sitting   Lower Body Dressing: Maximal assistance;Sit to/from stand   Toilet Transfer: Minimal assistance;BSC/3in1;Rolling walker (2 wheels)   Toileting- Clothing Manipulation and Hygiene: Sit to/from stand;Moderate assistance       Functional mobility during ADLs: Moderate assistance;Rolling walker (2 wheels)       Vision Patient Visual Report: No change from baseline       Perception     Praxis      Pertinent  Vitals/Pain Pain Assessment Pain Assessment: Faces Faces Pain Scale: Hurts little more Pain Location: L hip/thigh Pain Descriptors / Indicators: Operative site guarding, Aching Pain Intervention(s): Monitored during session, RN gave pain meds during session     Hand Dominance  Right   Extremity/Trunk Assessment Upper Extremity Assessment Upper Extremity Assessment: Overall WFL for tasks assessed   Lower Extremity Assessment Lower Extremity Assessment: Defer to PT evaluation   Cervical / Trunk Assessment Cervical / Trunk Assessment: Kyphotic   Communication Communication Communication: No difficulties   Cognition Arousal/Alertness: Awake/alert Behavior During Therapy: WFL for tasks assessed/performed Overall Cognitive Status: Within Functional Limits for tasks assessed                                       General Comments       Exercises     Shoulder Instructions      Home Living Family/patient expects to be discharged to:: Unsure Living Arrangements: Other relatives (granddaughter-works) Available Help at Discharge: Family Type of Home: House Home Access: Stairs to enter Secretary/administrator of Steps: 4-5 Entrance Stairs-Rails: Right Home Layout: One level               Home Equipment: None          Prior Functioning/Environment Prior Level of Function : Independent/Modified Independent                        OT Problem List: Decreased strength;Decreased range of motion;Decreased activity tolerance;Impaired balance (sitting and/or standing);Decreased knowledge of use of DME or AE      OT Treatment/Interventions: Self-care/ADL training;Therapeutic exercise;DME and/or AE instruction;Therapeutic activities;Balance training;Patient/family education    OT Goals(Current goals can be found in the care plan section) Acute Rehab OT Goals Patient Stated Goal: less pain and move more OT Goal Formulation: With patient Time For Goal Achievement: 04/09/23 Potential to Achieve Goals: Good  OT Frequency: Min 2X/week    Co-evaluation              AM-PAC OT "6 Clicks" Daily Activity     Outcome Measure Help from another person eating meals?: None Help from another person taking care of personal grooming?:  A Little Help from another person toileting, which includes using toliet, bedpan, or urinal?: A Lot Help from another person bathing (including washing, rinsing, drying)?: A Lot Help from another person to put on and taking off regular upper body clothing?: A Little Help from another person to put on and taking off regular lower body clothing?: A Lot 6 Click Score: 16   End of Session Equipment Utilized During Treatment: Gait belt;Rolling walker (2 wheels) Nurse Communication: Mobility status  Activity Tolerance: Patient tolerated treatment well Patient left: in chair  OT Visit Diagnosis: Muscle weakness (generalized) (M62.81);Other abnormalities of gait and mobility (R26.89)                Time: 1610-9604 OT Time Calculation (min): 17 min Charges:  OT General Charges $OT Visit: 1 Visit OT Evaluation $OT Eval Low Complexity: 1 Low  Donnella Sham, OTR/L Acute Care Rehab Services  Office [phone]   Kelli Churn 03/25/2023, 9:19 AM

## 2023-03-25 NOTE — Progress Notes (Signed)
PHARMACY NOTE:  ANTIMICROBIAL RENAL DOSAGE ADJUSTMENT  Current antimicrobial regimen includes a mismatch between antimicrobial dosage and estimated renal function.  As per policy approved by the Pharmacy & Therapeutics and Medical Executive Committees, the antimicrobial dosage will be adjusted accordingly.  Current antimicrobial dosage:  unasyn 1.5 gm q6h  Indication: aspiration PNA  Renal Function:  Estimated Creatinine Clearance: 29.2 mL/min (by C-G formula based on SCr of 0.9 mg/dL). []      On intermittent HD, scheduled: []      On CRRT    Antimicrobial dosage has been changed to:  unasyn 1.5gm IV q12h    Thank you for allowing pharmacy to be a part of this patient's care.  Lucia Gaskins, [facility] 03/25/2023 10:13 AM

## 2023-03-25 NOTE — Progress Notes (Signed)
Physical Therapy Treatment Patient Details Name: Michelle Vazquez MRN: [id number] DOB: 1929/04/10 Today's Date: 03/25/2023   History of Present Illness 89 yo female admitted with L hip fx after falling at home. S/P L THA-DA 03/23/23. Hx of OA, Afib, PSVT, MVP, osteoporosis, facial SLL, parathyroid adenoma    PT Comments    Progressing with mobility. O2 87-88% on RA during session. Pain appeared fairly controlled. She was able to ambulate ~7 feet with +2 safety assist (followed closely with recliner). Pt remains weak and fatigues fairly easily. Continue to recommend rehab after hospital stay.    Recommendations for follow up therapy are one component of a multi-disciplinary discharge planning process, led by the attending physician.  Recommendations may be updated based on patient status, additional functional criteria and insurance authorization.  Follow Up Recommendations  Can patient physically be transported by private vehicle: No    Assistance Recommended at Discharge Frequent or constant Supervision/Assistance  Patient can return home with the following Assist for transportation;Assistance with cooking/housework;Help with stairs or ramp for entrance;A lot of help with walking and/or transfers;A lot of help with bathing/dressing/bathroom   Equipment Recommendations  Rolling walker (2 wheels);BSC/3in1 (youth height)    Recommendations for Other Services       Precautions / Restrictions Precautions Precautions: Fall Precaution Comments: monitor O2, BP Restrictions Weight Bearing Restrictions: No LLE Weight Bearing: Weight bearing as tolerated     Mobility  Bed Mobility               General bed mobility comments: oob in recliner    Transfers Overall transfer level: Needs assistance Equipment used: Rolling walker (2 wheels) Transfers: Sit to/from Stand Sit to Stand: Min assist           General transfer comment: Assist to rise, stabilize, control descent.  Multmodal cues for safety, technique, hand placement. Increased time.    Ambulation/Gait Ambulation/Gait assistance: Min assist, +2 safety/equipment Gait Distance (Feet): 7 Feet Assistive device: Rolling walker (2 wheels) Gait Pattern/deviations: Step-to pattern       General Gait Details: Cues for safety, technique, sequence, posture, step length. Assist to stabilize pt throughout distance. Followed closely with recliner and used it to transport pt back to bedside. Pt c/o mild lightheadedness towards end of distance.   Stairs             Wheelchair Mobility    Modified Rankin (Stroke Patients Only)       Balance Overall balance assessment: Needs assistance         Standing balance support: During functional activity, Reliant on assistive device for balance Standing balance-Leahy Scale: Poor                              Cognition Arousal/Alertness: Awake/alert Behavior During Therapy: WFL for tasks assessed/performed Overall Cognitive Status: Within Functional Limits for tasks assessed                                          Exercises      General Comments        Pertinent Vitals/Pain Pain Assessment Pain Assessment: Faces Faces Pain Scale: Hurts little more Pain Location: L hip/thigh Pain Descriptors / Indicators: Operative site guarding, Aching Pain Intervention(s): Limited activity within patient's tolerance, Monitored during session, Repositioned, Ice applied    Home Living  Prior Function            PT Goals (current goals can now be found in the care plan section) Progress towards PT goals: Progressing toward goals    Frequency    Min 4X/week      PT Plan Current plan remains appropriate    Co-evaluation              AM-PAC PT "6 Clicks" Mobility   Outcome Measure  Help needed turning from your back to your side while in a flat bed without using bedrails?:  A Lot Help needed moving from lying on your back to sitting on the side of a flat bed without using bedrails?: A Lot Help needed moving to and from a bed to a chair (including a wheelchair)?: A Lot Help needed standing up from a chair using your arms (e.g., wheelchair or bedside chair)?: A Lot Help needed to walk in hospital room?: A Lot Help needed climbing 3-5 steps with a railing? : Total 6 Click Score: 11    End of Session Equipment Utilized During Treatment: Gait belt Activity Tolerance: Patient limited by fatigue;Patient limited by pain Patient left: in chair;with call bell/phone within reach;with chair alarm set;with family/visitor present   PT Visit Diagnosis: Other abnormalities of gait and mobility (R26.89);Pain Pain - Right/Left: Left Pain - part of body: Hip     Time: 1337-1400 PT Time Calculation (min) (ACUTE ONLY): 23 min  Charges:  $Gait Training: 23-37 mins                         Faye Ramsay, PT Acute Rehabilitation  Office: [phone]

## 2023-03-26 ENCOUNTER — Encounter (HOSPITAL_COMMUNITY): Payer: Self-pay | Admitting: Orthopaedic Surgery

## 2023-03-26 DIAGNOSIS — S72002A Fracture of unspecified part of neck of left femur, initial encounter for closed fracture: Secondary | ICD-10-CM | POA: Diagnosis not present

## 2023-03-26 DIAGNOSIS — K222 Esophageal obstruction: Secondary | ICD-10-CM | POA: Diagnosis not present

## 2023-03-26 DIAGNOSIS — N1831 Chronic kidney disease, stage 3a: Secondary | ICD-10-CM | POA: Diagnosis not present

## 2023-03-26 LAB — GLUCOSE, CAPILLARY: Glucose-Capillary: 115 mg/dL — ABNORMAL HIGH (ref 70–99)

## 2023-03-26 LAB — BLOOD CULTURE ID PANEL (REFLEXED) - BCID2

## 2023-03-26 LAB — CULTURE, BLOOD (ROUTINE X 2)
Special Requests: ADEQUATE
Special Requests: ADEQUATE

## 2023-03-26 MED ORDER — MELATONIN 5 MG PO TABS
5.0000 mg | ORAL_TABLET | Freq: Once | ORAL | Status: AC
  Administered 2023-03-26: 5 mg via ORAL
  Filled 2023-03-26: qty 1

## 2023-03-26 MED ORDER — ASPIRIN 81 MG PO CHEW: 81.0000 mg | CHEWABLE_TABLET | Freq: Two times a day (BID) | ORAL | 0 refills | Status: DC

## 2023-03-26 MED ORDER — MULTIVITAMIN ADULT PO CHEW
1.0000 | CHEWABLE_TABLET | Freq: Every day | ORAL | Status: DC
  Administered 2023-03-26 – 2023-03-27 (×2): 1 via ORAL

## 2023-03-26 MED ORDER — HYDROCODONE-ACETAMINOPHEN 5-325 MG PO TABS: 1.0000 | ORAL_TABLET | Freq: Four times a day (QID) | ORAL | 0 refills | Status: DC | PRN

## 2023-03-26 MED ORDER — ADULT MULTIVITAMIN W/MINERALS CH: 1.0000 | ORAL_TABLET | Freq: Every day | ORAL | Status: DC

## 2023-03-26 NOTE — Progress Notes (Signed)
Patient ID: Michelle Vazquez, female   DOB: 10-09-1929, 89 y.o.   MRN: [id number] The patient is awake and alert this morning.  She does report that her throat has been sore.  She was obviously deconditioned from several days of waiting for surgery and not eating.  Surgery did go well Friday evening.  She can weight-bear as tolerated on her left operative hip but should only be up with therapy.  She is on a baby aspirin twice a day.  She does need short-term skilled nursing placement.  Her left hip is otherwise stable.  I did change the dressing to make sure the incision was good with her left hip.

## 2023-03-26 NOTE — Progress Notes (Signed)
Physical Therapy Treatment Patient Details Name: Michelle Vazquez MRN: [id number] DOB: Nov 28, 1928 Today's Date: 03/26/2023    SATURATION QUALIFICATIONS: (This note is used to comply with regulatory documentation for home oxygen)   Patient Saturations on Room Air while Ambulating = 86%  Patient Saturations on 2 Liters of oxygen while Ambulating = 90%    History of Present Illness 89 yo female admitted with L hip fx after falling at home. S/P L THA-DA 03/23/23. Hx of OA, Afib, PSVT, MVP, osteoporosis, facial SLL, parathyroid adenoma    PT Comments    Pt agreeable to therapy. She is motivated to regain her PLOF/independence. Strength and activity tolerance are gradually improving. Feel pt could possibly tolerate 3 hours of therapy a day to regain her independence and PLOF-pt was independent at baseline. She has strong family support as well.    Recommendations for follow up therapy are one component of a multi-disciplinary discharge planning process, led by the attending physician.  Recommendations may be updated based on patient status, additional functional criteria and insurance authorization.  Follow Up Recommendations  Can patient physically be transported by private vehicle: No    Assistance Recommended at Discharge Frequent or constant Supervision/Assistance  Patient can return home with the following Assist for transportation;Assistance with cooking/housework;Help with stairs or ramp for entrance;A lot of help with walking and/or transfers;A lot of help with bathing/dressing/bathroom   Equipment Recommendations  Rolling walker (2 wheels);BSC/3in1 (youth height RW)    Recommendations for Other Services Rehab consult     Precautions / Restrictions Precautions Precautions: Fall Precaution Comments: monitor O2 Restrictions Weight Bearing Restrictions: No LLE Weight Bearing: Weight bearing as tolerated     Mobility  Bed Mobility               General bed mobility  comments: oob in recliner    Transfers Overall transfer level: Needs assistance Equipment used: Rolling walker (2 wheels) Transfers: Sit to/from Stand Sit to Stand: Min assist           General transfer comment: Assist to rise, stabilize, control descent. VCs for safety, technique, hand placement. Increased time.    Ambulation/Gait Ambulation/Gait assistance: Min assist Gait Distance (Feet): 15 Feet (x2) Assistive device: Rolling walker (2 wheels) Gait Pattern/deviations: Step-to pattern       General Gait Details: Cues for safety, technique, sequence, posture, step length, RW proximity. Assist to stabilize pt throughout distance. Followed closely with recliner. Seated rest break taken as needed. Distance limited by fatigue, general weakness.   Stairs             Wheelchair Mobility    Modified Rankin (Stroke Patients Only)       Balance Overall balance assessment: Needs assistance         Standing balance support: During functional activity, Reliant on assistive device for balance Standing balance-Leahy Scale: Poor                              Cognition Arousal/Alertness: Awake/alert Behavior During Therapy: WFL for tasks assessed/performed Overall Cognitive Status: Within Functional Limits for tasks assessed                                          Exercises Total Joint Exercises Ankle Circles/Pumps: AROM, Both, 10 reps Quad Sets: AROM, Both, 10 reps Heel Slides: AAROM, Left,  10 reps (pt used gait belt) Hip ABduction/ADduction: AAROM, Left, 10 reps (pt used gait belt)    General Comments        Pertinent Vitals/Pain Pain Assessment Pain Assessment: Faces Faces Pain Scale: Hurts little more Pain Location: throat, L hip Pain Descriptors / Indicators: Sore, Discomfort Pain Intervention(s): Limited activity within patient's tolerance, Monitored during session, Ice applied, Repositioned    Home Living                           Prior Function            PT Goals (current goals can now be found in the care plan section) Progress towards PT goals: Progressing toward goals    Frequency    Min 4X/week      PT Plan Current plan remains appropriate    Co-evaluation              AM-PAC PT "6 Clicks" Mobility   Outcome Measure  Help needed turning from your back to your side while in a flat bed without using bedrails?: A Little Help needed moving from lying on your back to sitting on the side of a flat bed without using bedrails?: A Lot Help needed moving to and from a bed to a chair (including a wheelchair)?: A Little Help needed standing up from a chair using your arms (e.g., wheelchair or bedside chair)?: A Little Help needed to walk in hospital room?: A Little Help needed climbing 3-5 steps with a railing? : Total 6 Click Score: 15    End of Session Equipment Utilized During Treatment: Gait belt Activity Tolerance: Patient limited by fatigue Patient left: in chair;with call bell/phone within reach;with family/visitor present   PT Visit Diagnosis: Other abnormalities of gait and mobility (R26.89);Pain Pain - Right/Left: Left Pain - part of body: Hip     Time: 4098-1191 PT Time Calculation (min) (ACUTE ONLY): 32 min  Charges:  $Gait Training: 8-22 mins $Therapeutic Exercise: 8-22 mins                         Faye Ramsay, PT Acute Rehabilitation  Office: [phone]

## 2023-03-26 NOTE — TOC Progression Note (Addendum)
Transition of Care [facility]) - Progression Note    Patient Details  Name: Michelle Vazquez MRN: [id number] Date of Birth: 1928/11/07  [facility]) CM/SW Contact  Amada Jupiter, LCSW Phone Number: 03/26/2023, 10:59 AM  Clinical Narrative:     Spoke with pt and daughter today and they confirm plan for SNF rehab.  Reviewed SNF preferences and will begin bed search process.  1333 ADDENDUM:   CIR has now offered bed and pt/ family have accepted with plan to admit to CIR tomorrow if medically stable.  Expected Discharge Plan: Home w Home Health Services (vs. possible SNF rehab) Barriers to Discharge: Continued Medical Work up  Expected Discharge Plan and Services In-house Referral: Clinical Social Work     Living arrangements for the past 2 months: Single Family Home                                       Social Determinants of Health (SDOH) Interventions SDOH Screenings   Food Insecurity: No Food Insecurity (03/21/2023)  Housing: Low Risk  (03/21/2023)  Transportation Needs: No Transportation Needs (03/21/2023)  Utilities: Not At Risk (03/21/2023)  Alcohol Screen: Low Risk  (06/16/2022)  Depression (PHQ2-9): Low Risk  (09/13/2022)  Financial Resource Strain: Low Risk  (06/16/2022)  Physical Activity: Sufficiently Active (06/16/2022)  Social Connections: Moderately Integrated (06/16/2022)  Stress: No Stress Concern Present (06/16/2022)  Tobacco Use: Low Risk  (03/23/2023)    Readmission Risk Interventions    03/23/2023    2:23 PM  Readmission Risk Prevention Plan  Post Dischage Appt Complete  Medication Screening Complete  Transportation Screening Complete

## 2023-03-26 NOTE — Progress Notes (Signed)
Inpatient Rehab Admissions Coordinator:    I spoke with pt. And daughter over the phone regarding potential CIR admit. She is interested and daughter confirms that family plans to rotate to provide 24/7 assist at discharge. I can offer Pt. A bed tomorrow if medically stable.   Megan Salon, MS, CCC-SLP Rehab Admissions Coordinator  [phone] (celll) [phone] (office)

## 2023-03-26 NOTE — Progress Notes (Signed)
[facility] PROGRESS NOTE    Progress Note  Michelle Vazquez  ZOX:[id number] DOB: 02-17-1929 DOA: 03/21/2023 PCP: Pincus Sanes, MD     Brief Narrative:   Michelle Vazquez is an 89 y.o. female past medical history significant for osteoarthritis, chronic atrial fibrillation, with paroxysmal SVT not on anticoagulation, osteoporosis and history of squamous cell cancer comes in after mechanical fall on the left hip developing immediate pain and inability to bear weight left hip x-ray shows subcapital left femur fracture   Assessment/Plan:   Closed left hip fracture, initial encounter [facility]) Orthopedic surgery she is status post left hip arthroplasty 03/23/2023. PT OT has been consulted, the patient and the family have now agreed to proceed with skilled nursing facility Aspirin twice a day for DVT prophylaxis. Out of bed to chair.  New acute respiratory failure with hypoxia due to new aspiration pneumonia: Start empirically on IV Unasyn, chest x-ray showed new infiltrates and left pleural effusion. Blood cultures have been sent been negative to date. Sensi spirometry and flutter valve.  Prolonged QTc/unifocal PVCs/paroxysmal SVT: With a chads Vascor of at least 6.  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 Continue flecainide not on anticoagulation. Try to keep potassium greater than 4 magnesium greater than 2.  Chronic Heart failure grade 2 diastolic dysfunction Appears euvolemic.  Hyperlipidemia: Noted.  Hyperglycemia: Likely reactive.  Essential hypertension: Not on any antihypertensive medications at home.  Chronic kidney disease stage IIIa: At baseline.  Esophageal motility/severe esophageal stricture: Was able to take a diet resume dysphagia 3 diet with Ensure. She is followed at [facility] for this. With persistent cough now with oxygen requirements.   DVT prophylaxis: lovenox Family  Communication:non Status is: Inpatient Remains inpatient appropriate because: Acute left hip fracture will probably need skilled nursing facility    Code Status:     Code Status Orders  (From admission, onward)           Start     Ordered   03/21/23 1645  Full code  Continuous       Question:  By:  Answer:  Consent: discussion documented in EHR   03/21/23 1647           Code Status History     Date Active Date Inactive Code Status Order ID Comments User Context   03/21/2023 1647 03/21/2023 1647 Full Code [id number]  Bobette Mo, MD ED      Advance Directive Documentation    Flowsheet Row Most Recent Value  Type of Advance Directive Healthcare Power of Attorney  Pre-existing out of facility DNR order (yellow form or pink MOST form) --  "MOST" Form in Place? --         IV Access:   Peripheral IV   Procedures and diagnostic studies:   DG CHEST PORT 1 VIEW  Result Date: 03/25/2023 CLINICAL DATA:  141880 SOB (shortness of breath) 141880 EXAM: PORTABLE CHEST - 1 VIEW COMPARISON:  03/21/2023 FINDINGS: New left pleural effusion, with interstitial and airspace opacities in the left lower lung. Right lung clear. Heart size upper limits normal. No pneumothorax. Visualized bones unremarkable. IMPRESSION: New left pleural effusion with left lower lung airspace disease. Electronically Signed   By: Corlis Leak M.D.   On: 03/25/2023 11:19     Medical Consultants:   None.   Subjective:    TAIJA COPPAGE relates her breathing about the same.  Objective:  Vitals:   03/25/23 0603 03/25/23 1328 03/25/23 2242 03/26/23 0604  BP: 134/73 (!) 120/55 (!) 104/55 (!) 140/74  Pulse: 87 98 88 89  Resp: 20 16 17 16   Temp: 98 F (36.7 C) 98.8 F (37.1 C) 98.9 F (37.2 C) 98.3 F (36.8 C)  TempSrc: Oral Oral Oral Oral  SpO2: 99% 96% 95% 96%  Weight:      Height:       SpO2: 96 % O2 Flow Rate (L/min): 1 L/min   Intake/Output Summary (Last 24 hours) at  03/26/2023 0836 Last data filed at 03/26/2023 0600 Gross per 24 hour  Intake 640.07 ml  Output 1100 ml  Net -459.93 ml    Filed Weights   03/21/23 1335 03/23/23 1356  Weight: 45.4 kg 45.4 kg    Exam: General exam: In no acute distress. Respiratory system: Good air movement and clear to auscultation. Cardiovascular system: S1 & S2 heard, RRR. No JVD. Gastrointestinal system: Abdomen is nondistended, soft and nontender.  Extremities: No pedal edema. Skin: No rashes, lesions or ulcers Psychiatry: Judgement and insight appear normal. Mood & affect appropriate. Data Reviewed:    Labs: Basic Metabolic Panel: Recent Labs  Lab 03/21/23 1343 03/22/23 0324 03/23/23 0821 03/24/23 0312  NA 137 129* 136 135  K 4.1 3.7 4.2 4.2  CL 99 97* 105 102  CO2 27 25 25 24   GLUCOSE 122* 148* 118* 142*  BUN 30* 23 18 25*  CREATININE 0.97 0.87 0.74 0.90  CALCIUM 9.4 8.1* 8.0* 7.8*  MG 2.0  --   --   --     GFR Estimated Creatinine Clearance: 29.2 mL/min (by C-G formula based on SCr of 0.9 mg/dL). Liver Function Tests: Recent Labs  Lab 03/22/23 0324  AST 19  ALT 17  ALKPHOS 42  BILITOT 1.2  PROT 6.4*  ALBUMIN 3.4*    No results for input(s): "LIPASE", "AMYLASE" in the last 168 hours. No results for input(s): "AMMONIA" in the last 168 hours. Coagulation profile No results for input(s): "INR", "PROTIME" in the last 168 hours. COVID-19 Labs  No results for input(s): "DDIMER", "FERRITIN", "LDH", "CRP" in the last 72 hours.  Lab Results  Component Value Date   SARSCOV2NAA NEGATIVE 07/26/2022   SARSCOV2NAA NEGATIVE 09/15/2019    CBC: Recent Labs  Lab 03/21/23 1343 03/22/23 0324 03/24/23 0312 03/25/23 0257  WBC 11.8* 10.1 7.6 7.7  NEUTROABS 9.3*  --   --   --   HGB 13.7 12.9 11.3* 9.8*  HCT 41.5 38.3 36.0 30.1*  MCV 94.5 94.3 97.6 96.8  PLT 258 211 156 152    Cardiac Enzymes: No results for input(s): "CKTOTAL", "CKMB", "CKMBINDEX", "TROPONINI" in the last 168  hours. BNP (last 3 results) No results for input(s): "PROBNP" in the last 8760 hours. CBG: No results for input(s): "GLUCAP" in the last 168 hours. D-Dimer: No results for input(s): "DDIMER" in the last 72 hours. Hgb A1c: No results for input(s): "HGBA1C" in the last 72 hours. Lipid Profile: No results for input(s): "CHOL", "HDL", "LDLCALC", "TRIG", "CHOLHDL", "LDLDIRECT" in the last 72 hours. Thyroid function studies: No results for input(s): "TSH", "T4TOTAL", "T3FREE", "THYROIDAB" in the last 72 hours.  Invalid input(s): "FREET3" Anemia work up: No results for input(s): "VITAMINB12", "FOLATE", "FERRITIN", "TIBC", "IRON", "RETICCTPCT" in the last 72 hours. Sepsis Labs: Recent Labs  Lab 03/21/23 1343 03/22/23 0324 03/24/23 0312 03/25/23 0257  WBC 11.8* 10.1 7.6 7.7    Microbiology Recent Results (from the past 240 hour(s))  Surgical PCR screen     Status: None   Collection Time: 03/22/23 12:16 AM   Specimen: Nasal Mucosa; Nasal Swab  Result Value Ref Range Status   MRSA, PCR NEGATIVE NEGATIVE Final   Staphylococcus aureus NEGATIVE NEGATIVE Final    Comment: (NOTE) The Xpert SA Assay (FDA approved for NASAL specimens in patients 47 years of age and older), is one component of a comprehensive surveillance program. It is not intended to diagnose infection nor to guide or monitor treatment. Performed at [facility], [address], [place], [place] [address]      Medications:    aspirin  81 mg Oral BID   docusate  100 mg Oral BID   feeding supplement  237 mL Oral BID BM   flecainide  75 mg Oral BID   mupirocin ointment  1 Application Nasal BID   mouth rinse  15 mL Mouth Rinse 4 times per day   pantoprazole (PROTONIX) IV  40 mg Intravenous Q12H   protein supplement  2 oz Oral QID   Continuous Infusions:  sodium chloride Stopped (03/24/23 1019)   ampicillin-sulbactam (UNASYN) IV 1.5 g (03/25/23 2149)   methocarbamol (ROBAXIN) IV Stopped  (03/23/23 1939)      LOS: 5 days   Marinda Elk  [facility]  03/26/2023, 8:36 AM

## 2023-03-26 NOTE — Plan of Care (Signed)

## 2023-03-26 NOTE — Progress Notes (Signed)
PHARMACY - PHYSICIAN COMMUNICATION CRITICAL VALUE ALERT - BLOOD CULTURE IDENTIFICATION (BCID)  Michelle Vazquez is an 89 y.o. female who presented to [facility] on 03/21/2023 with a chief complaint of cloased left hip fracture.  Assessment:   Patient is afebrile and WBC WNL.  Currently being treated for aspiration pneumonia with Unasyn.   5/19 Bcx: 1 out of 2 positive for staph epi, methicillin resistance detected  Name of physician (or Provider) Contacted: David Stall  Current antibiotics: Unasyn  Changes to prescribed antibiotics recommended:  Contamination suspected; no treatment needed.   Continue Unasyn for aspiration pna.  Results for orders placed or performed during the hospital encounter of 03/21/23  Blood Culture ID Panel (Reflexed) (Collected: 03/25/2023  9:41 AM)  Result Value Ref Range   Enterococcus faecalis NOT DETECTED NOT DETECTED   Enterococcus Faecium NOT DETECTED NOT DETECTED   Listeria monocytogenes NOT DETECTED NOT DETECTED   Staphylococcus species DETECTED (A) NOT DETECTED   Staphylococcus aureus (BCID) NOT DETECTED NOT DETECTED   Staphylococcus epidermidis DETECTED (A) NOT DETECTED   Staphylococcus lugdunensis NOT DETECTED NOT DETECTED   Streptococcus species NOT DETECTED NOT DETECTED   Streptococcus agalactiae NOT DETECTED NOT DETECTED   Streptococcus pneumoniae NOT DETECTED NOT DETECTED   Streptococcus pyogenes NOT DETECTED NOT DETECTED   A.calcoaceticus-baumannii NOT DETECTED NOT DETECTED   Bacteroides fragilis NOT DETECTED NOT DETECTED   Enterobacterales NOT DETECTED NOT DETECTED   Enterobacter cloacae complex NOT DETECTED NOT DETECTED   Escherichia coli NOT DETECTED NOT DETECTED   Klebsiella aerogenes NOT DETECTED NOT DETECTED   Klebsiella oxytoca NOT DETECTED NOT DETECTED   Klebsiella pneumoniae NOT DETECTED NOT DETECTED   Proteus species NOT DETECTED NOT DETECTED   Salmonella species NOT DETECTED NOT DETECTED   Serratia marcescens NOT DETECTED NOT  DETECTED   Haemophilus influenzae NOT DETECTED NOT DETECTED   Neisseria meningitidis NOT DETECTED NOT DETECTED   Pseudomonas aeruginosa NOT DETECTED NOT DETECTED   Stenotrophomonas maltophilia NOT DETECTED NOT DETECTED   Candida albicans NOT DETECTED NOT DETECTED   Candida auris NOT DETECTED NOT DETECTED   Candida glabrata NOT DETECTED NOT DETECTED   Candida krusei NOT DETECTED NOT DETECTED   Candida parapsilosis NOT DETECTED NOT DETECTED   Candida tropicalis NOT DETECTED NOT DETECTED   Cryptococcus neoformans/gattii NOT DETECTED NOT DETECTED   Methicillin resistance mecA/C DETECTED (A) NOT DETECTED    Lynden Ang, PharmD, BCPS 03/26/2023  3:48 PM

## 2023-03-26 NOTE — NC FL2 (Signed)
[place] MEDICAID FL2 LEVEL OF CARE FORM     IDENTIFICATION  Patient Name: Michelle Vazquez Birthdate: 01-Sep-1929 Sex: female Admission Date (Current Location): 03/21/2023  [facility] and [place] Number:  Producer, television/film/video and Address:  [facility],  [address] [place], [place] [address]      Provider Number: [id number]  Attending Physician Name and Address:  Marinda Elk, MD  Relative Name and Phone Number:  daughter, Jacquelynn Cree @ [phone]    Current Level of Care: Hospital Recommended Level of Care: Skilled Nursing Facility Prior Approval Number:    Date Approved/Denied:   PASRR Number: [id number]  Discharge Plan: SNF    Current Diagnoses: Patient Active Problem List   Diagnosis Date Noted   Closed left hip fracture, initial encounter (HCC) 03/21/2023   Prolonged QT interval 03/21/2023   Paroxysmal SVT (supraventricular tachycardia) 03/21/2023   Paroxysmal atrial fibrillation (HCC) 03/21/2023   Grade II diastolic dysfunction 03/21/2023   CKD (chronic kidney disease) stage 3, GFR 30-59 ml/min (HCC) 09/07/2020   Hypertension 09/07/2019   Esophageal stenosis 09/01/2019   Cough 09/04/2017   Family history of diabetes mellitus (DM) 08/28/2016   Hyperglycemia 08/28/2016   Osteoporosis    Pharyngoesophageal dysphagia 08/28/2013   Arthritis    HLD (hyperlipidemia)    Unifocal PVCs 06/21/2011   Mitral valve prolapse 06/21/2011   Parathyroid adenoma 06/21/2011    Orientation RESPIRATION BLADDER Height & Weight     Self, Time, Situation, Place  O2 Incontinent, External catheter (currently with purewick) Weight: 100 lb (45.4 kg) Height:  5\' 1"  (154.9 cm)  BEHAVIORAL SYMPTOMS/MOOD NEUROLOGICAL BOWEL NUTRITION STATUS      Continent Diet  AMBULATORY STATUS COMMUNICATION OF NEEDS Skin   Limited Assist Verbally Other (Comment) (surgical insicion only)                       Personal Care Assistance Level of Assistance  Bathing, Dressing  Bathing Assistance: Limited assistance   Dressing Assistance: Limited assistance     Functional Limitations Info  Sight, Hearing, Speech Sight Info: Adequate Hearing Info: Adequate Speech Info: Adequate    SPECIAL CARE FACTORS FREQUENCY  PT (By licensed PT), OT (By licensed OT)     PT Frequency: 5x/wk OT Frequency: 5x/wk            Contractures Contractures Info: Not present    Additional Factors Info  Code Status, Allergies Code Status Info: Full Allergies Info: Diltiazem Hcl, Inderal (Propranolol Hcl), Lopressor (Metoprolol Tartrate), Penicillins           Current Medications (03/26/2023):  This is the current hospital active medication list Current Facility-Administered Medications  Medication Dose Route Frequency Provider Last Rate Last Admin   0.9 %  sodium chloride infusion   Intravenous Continuous Marinda Elk, MD   Stopped at 03/24/23 1019   acetaminophen (TYLENOL) tablet 650 mg  650 mg Oral Q6H PRN Kathryne Hitch, MD   650 mg at 03/26/23 8469   Or   acetaminophen (TYLENOL) suppository 650 mg  650 mg Rectal Q6H PRN Kathryne Hitch, MD       alum & mag hydroxide-simeth (MAALOX/MYLANTA) 200-200-20 MG/5ML suspension 30 mL  30 mL Oral Q4H PRN Kathryne Hitch, MD       ampicillin-sulbactam (UNASYN) 1.5 g in sodium chloride 0.9 % 100 mL IVPB  1.5 g Intravenous Q12H Marinda Elk, MD 200 mL/hr at 03/26/23 1119 1.5 g at 03/26/23 1119  aspirin chewable tablet 81 mg  81 mg Oral BID Kathryne Hitch, MD   81 mg at 03/26/23 0900   diphenhydrAMINE (BENADRYL) 12.5 MG/5ML elixir 12.5-25 mg  12.5-25 mg Oral Q4H PRN Kathryne Hitch, MD       docusate (COLACE) 50 MG/5ML liquid 100 mg  100 mg Oral BID Marinda Elk, MD   100 mg at 03/26/23 0924   feeding supplement (ENSURE ENLIVE / ENSURE PLUS) liquid 237 mL  237 mL Oral BID BM Kathryne Hitch, MD   237 mL at 03/26/23 0901   flecainide (TAMBOCOR) tablet 75 mg  75  mg Oral BID Kathryne Hitch, MD   75 mg at 03/26/23 0900   HYDROcodone-acetaminophen (NORCO) 7.5-325 MG per tablet 1-2 tablet  1-2 tablet Oral Q4H PRN Kathryne Hitch, MD       HYDROcodone-acetaminophen (NORCO/VICODIN) 5-325 MG per tablet 1-2 tablet  1-2 tablet Oral Q4H PRN Kathryne Hitch, MD       magnesium hydroxide (MILK OF MAGNESIA) suspension 30 mL  30 mL Oral Daily PRN Kathryne Hitch, MD       menthol-cetylpyridinium (CEPACOL) lozenge 3 mg  1 lozenge Oral PRN Kathryne Hitch, MD       Or   phenol (CHLORASEPTIC) mouth spray 1 spray  1 spray Mouth/Throat PRN Kathryne Hitch, MD   1 spray at 03/25/23 2209   methocarbamol (ROBAXIN) tablet 500 mg  500 mg Oral Q6H PRN Kathryne Hitch, MD       Or   methocarbamol (ROBAXIN) 500 mg in dextrose 5 % 50 mL IVPB  500 mg Intravenous Q6H PRN Kathryne Hitch, MD   Stopped at 03/23/23 1939   metoCLOPramide (REGLAN) tablet 5-10 mg  5-10 mg Oral Q8H PRN Kathryne Hitch, MD       Or   metoCLOPramide (REGLAN) injection 5-10 mg  5-10 mg Intravenous Q8H PRN Kathryne Hitch, MD       morphine (PF) 2 MG/ML injection 0.5-1 mg  0.5-1 mg Intravenous Q2H PRN Kathryne Hitch, MD       mupirocin ointment (BACTROBAN) 2 % 1 Application  1 Application Nasal BID Kathryne Hitch, MD   1 Application at 03/26/23 4098   Oral care mouth rinse  15 mL Mouth Rinse 4 times per day Kathryne Hitch, MD   15 mL at 03/26/23 1191   Oral care mouth rinse  15 mL Mouth Rinse PRN Kathryne Hitch, MD       pantoprazole (PROTONIX) injection 40 mg  40 mg Intravenous Q12H Marinda Elk, MD   40 mg at 03/26/23 4782   prochlorperazine (COMPAZINE) injection 5 mg  5 mg Intravenous Q4H PRN Kathryne Hitch, MD       protein supplement (UNJURY PLANTED PROTEIN) powder 2 oz  2 oz Oral QID Marinda Elk, MD   2 oz at 03/26/23 9562     Discharge Medications: Please see  discharge summary for a list of discharge medications.  Relevant Imaging Results:  Relevant Lab Results:   Additional Information SS# [id number]  Amada Jupiter, LCSW

## 2023-03-26 NOTE — Progress Notes (Signed)
Physical Therapy Treatment Patient Details Name: Michelle Vazquez MRN: [id number] DOB: July 12, 1929 Today's Date: 03/26/2023   History of Present Illness 89 yo female admitted with L hip fx after falling at home. S/P L THA-DA 03/23/23. Hx of OA, Afib, PSVT, MVP, osteoporosis, facial SLL, parathyroid adenoma    PT Comments    Ms Shantai is progressing toward PT goals. Improved tol to activity/incr gait distance, reliant on supplemental O2 to  maintain sats >90s.   Recommendations for follow up therapy are one component of a multi-disciplinary discharge planning process, led by the attending physician.  Recommendations may be updated based on patient status, additional functional criteria and insurance authorization.  Follow Up Recommendations  Can patient physically be transported by private vehicle: No    Assistance Recommended at Discharge Frequent or constant Supervision/Assistance  Patient can return home with the following Assist for transportation;Assistance with cooking/housework;Help with stairs or ramp for entrance;A lot of help with walking and/or transfers;A lot of help with bathing/dressing/bathroom   Equipment Recommendations  Rolling walker (2 wheels);BSC/3in1    Recommendations for Other Services Rehab consult     Precautions / Restrictions Precautions Precautions: Fall Precaution Comments: monitor O2 Restrictions Weight Bearing Restrictions: No LLE Weight Bearing: Weight bearing as tolerated     Mobility  Bed Mobility               General bed mobility comments: oob in recliner    Transfers Overall transfer level: Needs assistance Equipment used: Rolling walker (2 wheels) Transfers: Sit to/from Stand Sit to Stand: Min assist           General transfer comment: Assist to rise, stabilize, control descent. VCs for safety, technique, hand placement. Increased time.    Ambulation/Gait Ambulation/Gait assistance: Min assist, Min guard Gait Distance  (Feet): 40 Feet Assistive device: Rolling walker (2 wheels) Gait Pattern/deviations: Step-to pattern       General Gait Details: Cues for safety, technique, sequence, posture, step length, RW proximity. Assist to stabilize pt throughout distance. Followed closely with recliner. Seated rest break end of distance; on 2L O2 with SpO2 >93% throughout session   Stairs             Wheelchair Mobility    Modified Rankin (Stroke Patients Only)       Balance           Standing balance support: During functional activity, Reliant on assistive device for balance Standing balance-Leahy Scale: Poor                              Cognition Arousal/Alertness: Awake/alert Behavior During Therapy: WFL for tasks assessed/performed Overall Cognitive Status: Within Functional Limits for tasks assessed                                          Exercises Total Joint Exercises Ankle Circles/Pumps: AROM, Both, 10 reps    General Comments        Pertinent Vitals/Pain Pain Assessment Pain Assessment: Faces Faces Pain Scale: Hurts little more Pain Location: throat, L hip Pain Descriptors / Indicators: Sore, Discomfort Pain Intervention(s): Limited activity within patient's tolerance, Monitored during session, Repositioned, Premedicated before session, Ice applied (ice to hip)    Home Living  Prior Function            PT Goals (current goals can now be found in the care plan section) Acute Rehab PT Goals Patient Stated Goal: regain PLOF/independence PT Goal Formulation: With patient/family Time For Goal Achievement: 04/07/23 Potential to Achieve Goals: Good Progress towards PT goals: Progressing toward goals    Frequency    Min 4X/week      PT Plan Current plan remains appropriate    Co-evaluation              AM-PAC PT "6 Clicks" Mobility   Outcome Measure  Help needed turning from your back  to your side while in a flat bed without using bedrails?: A Little Help needed moving from lying on your back to sitting on the side of a flat bed without using bedrails?: A Little Help needed moving to and from a bed to a chair (including a wheelchair)?: A Little Help needed standing up from a chair using your arms (e.g., wheelchair or bedside chair)?: A Little Help needed to walk in hospital room?: A Lot Help needed climbing 3-5 steps with a railing? : A Lot 6 Click Score: 16    End of Session Equipment Utilized During Treatment: Gait belt Activity Tolerance: Patient tolerated treatment well Patient left: in chair;with call bell/phone within reach;with chair alarm set;with family/visitor present   PT Visit Diagnosis: Other abnormalities of gait and mobility (R26.89);Pain Pain - Right/Left: Left Pain - part of body: Hip     Time: 1343-1411 PT Time Calculation (min) (ACUTE ONLY): 28 min  Charges:  $Gait Training: 23-37 mins $Therapeutic Exercise: 8-22 mins                     Delice Bison, PT  Acute Rehab Dept ([facility]) [phone]  03/26/2023    [facility] 03/26/2023, 2:17 PM

## 2023-03-26 NOTE — Progress Notes (Signed)
Inpatient Rehab Admissions Coordinator:  ° °Patient was screened for CIR candidacy by Aviah Sorci, MS, CCC-SLP. At this time, Pt. Appears to be a a potential candidate for CIR. I will place  order for rehab consult per protocol for full assessment. Please contact me any with questions. ° °Talise Sligh, MS, CCC-SLP °Rehab Admissions Coordinator  °[phone] (celll) °[phone] (office) ° °

## 2023-03-26 NOTE — Plan of Care (Signed)
  Problem: Pain Managment: Goal: General experience of comfort will improve Outcome: Progressing   Problem: Coping: Goal: Level of anxiety will decrease Outcome: Progressing   

## 2023-03-27 ENCOUNTER — Encounter (HOSPITAL_COMMUNITY): Payer: Self-pay | Admitting: Physical Medicine and Rehabilitation

## 2023-03-27 ENCOUNTER — Inpatient Hospital Stay (HOSPITAL_COMMUNITY)
Admission: RE | Admit: 2023-03-27 | Discharge: 2023-04-08 | DRG: 559 | Disposition: A | Discharge status: Home Health | Payer: Medicare Other | Source: Other Acute Inpatient Hospital | Attending: Physical Medicine and Rehabilitation | Admitting: Physical Medicine and Rehabilitation

## 2023-03-27 ENCOUNTER — Other Ambulatory Visit: Payer: Self-pay

## 2023-03-27 ENCOUNTER — Inpatient Hospital Stay (HOSPITAL_COMMUNITY): Payer: Medicare Other

## 2023-03-27 DIAGNOSIS — D351 Benign neoplasm of parathyroid gland: Secondary | ICD-10-CM | POA: Diagnosis present

## 2023-03-27 DIAGNOSIS — Z79899 Other long term (current) drug therapy: Secondary | ICD-10-CM

## 2023-03-27 DIAGNOSIS — E860 Dehydration: Secondary | ICD-10-CM | POA: Diagnosis present

## 2023-03-27 DIAGNOSIS — S72012D Unspecified intracapsular fracture of left femur, subsequent encounter for closed fracture with routine healing: Principal | ICD-10-CM

## 2023-03-27 DIAGNOSIS — Z8249 Family history of ischemic heart disease and other diseases of the circulatory system: Secondary | ICD-10-CM | POA: Diagnosis not present

## 2023-03-27 DIAGNOSIS — R059 Cough, unspecified: Secondary | ICD-10-CM | POA: Diagnosis not present

## 2023-03-27 DIAGNOSIS — J90 Pleural effusion, not elsewhere classified: Secondary | ICD-10-CM | POA: Diagnosis present

## 2023-03-27 DIAGNOSIS — I341 Nonrheumatic mitral (valve) prolapse: Secondary | ICD-10-CM | POA: Diagnosis present

## 2023-03-27 DIAGNOSIS — Z833 Family history of diabetes mellitus: Secondary | ICD-10-CM | POA: Diagnosis not present

## 2023-03-27 DIAGNOSIS — I129 Hypertensive chronic kidney disease with stage 1 through stage 4 chronic kidney disease, or unspecified chronic kidney disease: Secondary | ICD-10-CM | POA: Diagnosis present

## 2023-03-27 DIAGNOSIS — Z83438 Family history of other disorder of lipoprotein metabolism and other lipidemia: Secondary | ICD-10-CM | POA: Diagnosis not present

## 2023-03-27 DIAGNOSIS — K219 Gastro-esophageal reflux disease without esophagitis: Secondary | ICD-10-CM | POA: Diagnosis present

## 2023-03-27 DIAGNOSIS — Z823 Family history of stroke: Secondary | ICD-10-CM

## 2023-03-27 DIAGNOSIS — I5189 Other ill-defined heart diseases: Secondary | ICD-10-CM

## 2023-03-27 DIAGNOSIS — R636 Underweight: Secondary | ICD-10-CM | POA: Diagnosis not present

## 2023-03-27 DIAGNOSIS — Z681 Body mass index (BMI) 19 or less, adult: Secondary | ICD-10-CM

## 2023-03-27 DIAGNOSIS — K224 Dyskinesia of esophagus: Secondary | ICD-10-CM | POA: Diagnosis present

## 2023-03-27 DIAGNOSIS — E785 Hyperlipidemia, unspecified: Secondary | ICD-10-CM | POA: Diagnosis present

## 2023-03-27 DIAGNOSIS — Z800 Family history of malignant neoplasm of digestive organs: Secondary | ICD-10-CM

## 2023-03-27 DIAGNOSIS — R739 Hyperglycemia, unspecified: Secondary | ICD-10-CM | POA: Diagnosis not present

## 2023-03-27 DIAGNOSIS — R1319 Other dysphagia: Secondary | ICD-10-CM | POA: Diagnosis not present

## 2023-03-27 DIAGNOSIS — K222 Esophageal obstruction: Secondary | ICD-10-CM | POA: Diagnosis present

## 2023-03-27 DIAGNOSIS — N1831 Chronic kidney disease, stage 3a: Secondary | ICD-10-CM | POA: Diagnosis not present

## 2023-03-27 DIAGNOSIS — E8770 Fluid overload, unspecified: Secondary | ICD-10-CM | POA: Diagnosis present

## 2023-03-27 DIAGNOSIS — S72002S Fracture of unspecified part of neck of left femur, sequela: Secondary | ICD-10-CM

## 2023-03-27 DIAGNOSIS — Z8261 Family history of arthritis: Secondary | ICD-10-CM | POA: Diagnosis not present

## 2023-03-27 DIAGNOSIS — Z86007 Personal history of in-situ neoplasm of skin: Secondary | ICD-10-CM | POA: Diagnosis not present

## 2023-03-27 DIAGNOSIS — D62 Acute posthemorrhagic anemia: Secondary | ICD-10-CM | POA: Diagnosis present

## 2023-03-27 DIAGNOSIS — W19XXXD Unspecified fall, subsequent encounter: Secondary | ICD-10-CM | POA: Diagnosis present

## 2023-03-27 DIAGNOSIS — M810 Age-related osteoporosis without current pathological fracture: Secondary | ICD-10-CM | POA: Diagnosis present

## 2023-03-27 DIAGNOSIS — I10 Essential (primary) hypertension: Secondary | ICD-10-CM | POA: Diagnosis present

## 2023-03-27 DIAGNOSIS — N183 Chronic kidney disease, stage 3 unspecified: Secondary | ICD-10-CM | POA: Diagnosis present

## 2023-03-27 DIAGNOSIS — J690 Pneumonitis due to inhalation of food and vomit: Secondary | ICD-10-CM | POA: Diagnosis present

## 2023-03-27 DIAGNOSIS — G4700 Insomnia, unspecified: Secondary | ICD-10-CM | POA: Diagnosis not present

## 2023-03-27 DIAGNOSIS — R1314 Dysphagia, pharyngoesophageal phase: Secondary | ICD-10-CM | POA: Diagnosis present

## 2023-03-27 DIAGNOSIS — E8779 Other fluid overload: Secondary | ICD-10-CM | POA: Diagnosis not present

## 2023-03-27 DIAGNOSIS — Z7982 Long term (current) use of aspirin: Secondary | ICD-10-CM

## 2023-03-27 DIAGNOSIS — I480 Paroxysmal atrial fibrillation: Secondary | ICD-10-CM | POA: Diagnosis present

## 2023-03-27 DIAGNOSIS — R1310 Dysphagia, unspecified: Secondary | ICD-10-CM | POA: Diagnosis present

## 2023-03-27 DIAGNOSIS — Z880 Allergy status to penicillin: Secondary | ICD-10-CM

## 2023-03-27 DIAGNOSIS — I4891 Unspecified atrial fibrillation: Secondary | ICD-10-CM | POA: Diagnosis present

## 2023-03-27 DIAGNOSIS — R262 Difficulty in walking, not elsewhere classified: Secondary | ICD-10-CM | POA: Diagnosis present

## 2023-03-27 DIAGNOSIS — S72002A Fracture of unspecified part of neck of left femur, initial encounter for closed fracture: Secondary | ICD-10-CM | POA: Diagnosis not present

## 2023-03-27 DIAGNOSIS — Z96642 Presence of left artificial hip joint: Secondary | ICD-10-CM | POA: Diagnosis present

## 2023-03-27 DIAGNOSIS — Z7409 Other reduced mobility: Secondary | ICD-10-CM | POA: Diagnosis present

## 2023-03-27 DIAGNOSIS — Z888 Allergy status to other drugs, medicaments and biological substances status: Secondary | ICD-10-CM

## 2023-03-27 LAB — CBC WITH DIFFERENTIAL/PLATELET
Abs Immature Granulocytes: 0.04 10*3/uL (ref 0.00–0.07)
Basophils Absolute: 0 10*3/uL (ref 0.0–0.1)
Basophils Relative: 0 %
Eosinophils Absolute: 0.1 10*3/uL (ref 0.0–0.5)
Eosinophils Relative: 2 %
HCT: 28.1 % — ABNORMAL LOW (ref 36.0–46.0)
Hemoglobin: 9.1 g/dL — ABNORMAL LOW (ref 12.0–15.0)
Immature Granulocytes: 1 %
Lymphocytes Relative: 22 %
Lymphs Abs: 1.6 10*3/uL (ref 0.7–4.0)
MCH: 31.5 pg (ref 26.0–34.0)
MCHC: 32.4 g/dL (ref 30.0–36.0)
MCV: 97.2 fL (ref 80.0–100.0)
Monocytes Absolute: 0.9 10*3/uL (ref 0.1–1.0)
Monocytes Relative: 12 %
Neutro Abs: 4.5 10*3/uL (ref 1.7–7.7)
Neutrophils Relative %: 63 %
Platelets: 182 10*3/uL (ref 150–400)
RBC: 2.89 MIL/uL — ABNORMAL LOW (ref 3.87–5.11)
RDW: 12.7 % (ref 11.5–15.5)
WBC: 7.1 10*3/uL (ref 4.0–10.5)
nRBC: 0 % (ref 0.0–0.2)

## 2023-03-27 LAB — CULTURE, BLOOD (ROUTINE X 2)

## 2023-03-27 LAB — BASIC METABOLIC PANEL
Anion gap: 5 (ref 5–15)
BUN: 21 mg/dL (ref 8–23)
CO2: 26 mmol/L (ref 22–32)
Calcium: 7.6 mg/dL — ABNORMAL LOW (ref 8.9–10.3)
Chloride: 105 mmol/L (ref 98–111)
Creatinine, Ser: 0.75 mg/dL (ref 0.44–1.00)
GFR, Estimated: >60 mL/min (ref >60)
Glucose, Bld: 109 mg/dL — ABNORMAL HIGH (ref 70–99)
Potassium: 3.7 mmol/L (ref 3.5–5.1)
Sodium: 136 mmol/L (ref 135–145)

## 2023-03-27 MED ORDER — DOCUSATE SODIUM 50 MG/5ML PO LIQD
100.0000 mg | Freq: Two times a day (BID) | ORAL | Status: DC
  Administered 2023-03-29 – 2023-04-06 (×8): 100 mg via ORAL
  Filled 2023-03-27 (×20): qty 10

## 2023-03-27 MED ORDER — PHENOL 1.4 % MT LIQD: 1.0000 | OROMUCOSAL | Status: DC | PRN

## 2023-03-27 MED ORDER — HYDROCODONE-ACETAMINOPHEN 7.5-325 MG PO TABS
1.0000 | ORAL_TABLET | ORAL | Status: DC | PRN
  Administered 2023-03-27 – 2023-04-08 (×13): 1 via ORAL
  Filled 2023-03-27 (×14): qty 1

## 2023-03-27 MED ORDER — DIPHENHYDRAMINE HCL 25 MG PO CAPS: 25.0000 mg | ORAL_CAPSULE | Freq: Four times a day (QID) | ORAL | Status: DC | PRN

## 2023-03-27 MED ORDER — PROCHLORPERAZINE EDISYLATE 10 MG/2ML IJ SOLN: 5.0000 mg | Freq: Four times a day (QID) | INTRAMUSCULAR | Status: DC | PRN

## 2023-03-27 MED ORDER — BOOST / RESOURCE BREEZE PO LIQD CUSTOM
1.0000 | Freq: Three times a day (TID) | ORAL | Status: DC
  Administered 2023-03-27: 1 via ORAL

## 2023-03-27 MED ORDER — SODIUM CHLORIDE 0.9 % IV SOLN
1.5000 g | Freq: Three times a day (TID) | INTRAVENOUS | Status: AC
  Administered 2023-03-27 – 2023-03-31 (×14): 1.5 g via INTRAVENOUS
  Filled 2023-03-27 (×15): qty 4

## 2023-03-27 MED ORDER — ACETAMINOPHEN 325 MG PO TABS: 325.0000 mg | ORAL_TABLET | ORAL | Status: DC | PRN

## 2023-03-27 MED ORDER — ADULT MULTIVITAMIN W/MINERALS CH: 1.0000 | ORAL_TABLET | Freq: Every day | ORAL | Status: DC

## 2023-03-27 MED ORDER — MELATONIN 5 MG PO TABS
5.0000 mg | ORAL_TABLET | Freq: Every evening | ORAL | Status: DC | PRN
  Administered 2023-03-29: 5 mg via ORAL
  Filled 2023-03-27: qty 1

## 2023-03-27 MED ORDER — METHOCARBAMOL 500 MG PO TABS
500.0000 mg | ORAL_TABLET | Freq: Four times a day (QID) | ORAL | Status: DC | PRN
Start: 2023-03-27 — End: 2023-03-27

## 2023-03-27 MED ORDER — UNJURY PLANTED PROTEIN POWDER
2.0000 [oz_av] | Freq: Four times a day (QID) | ORAL | Status: DC
  Filled 2023-03-27 (×3): qty 25

## 2023-03-27 MED ORDER — SODIUM CHLORIDE 0.9 % IV SOLN
1.5000 g | Freq: Three times a day (TID) | INTRAVENOUS | Status: DC
  Administered 2023-03-27: 1.5 g via INTRAVENOUS
  Filled 2023-03-27 (×2): qty 4

## 2023-03-27 MED ORDER — PANTOPRAZOLE SODIUM 40 MG IV SOLR: 40.0000 mg | Freq: Two times a day (BID) | INTRAVENOUS | Status: DC

## 2023-03-27 MED ORDER — PROCHLORPERAZINE 25 MG RE SUPP: 12.5000 mg | Freq: Four times a day (QID) | RECTAL | Status: DC | PRN

## 2023-03-27 MED ORDER — KATE FARMS STANDARD 1.4 PO LIQD
325.0000 mL | Freq: Two times a day (BID) | ORAL | Status: DC
  Administered 2023-03-28: 325 mL via ORAL
  Filled 2023-03-27 (×4): qty 325

## 2023-03-27 MED ORDER — ASPIRIN 81 MG PO CHEW
81.0000 mg | CHEWABLE_TABLET | Freq: Two times a day (BID) | ORAL | Status: DC
  Administered 2023-03-27 – 2023-04-08 (×24): 81 mg via ORAL
  Filled 2023-03-27 (×25): qty 1

## 2023-03-27 MED ORDER — ORAL CARE MOUTH RINSE: 15.0000 mL | OROMUCOSAL | Status: DC | PRN

## 2023-03-27 MED ORDER — BISACODYL 10 MG RE SUPP: 10.0000 mg | Freq: Every day | RECTAL | Status: DC | PRN

## 2023-03-27 MED ORDER — GUAIFENESIN-DM 100-10 MG/5ML PO SYRP: 5.0000 mL | ORAL_SOLUTION | Freq: Four times a day (QID) | ORAL | Status: DC | PRN

## 2023-03-27 MED ORDER — METHOCARBAMOL 500 MG PO TABS
500.0000 mg | ORAL_TABLET | Freq: Four times a day (QID) | ORAL | Status: DC | PRN
  Administered 2023-04-02 – 2023-04-05 (×2): 500 mg via ORAL
  Filled 2023-03-27 (×3): qty 1

## 2023-03-27 MED ORDER — FLEET ENEMA 7-19 GM/118ML RE ENEM: 1.0000 | ENEMA | Freq: Once | RECTAL | Status: DC | PRN

## 2023-03-27 MED ORDER — MENTHOL 3 MG MT LOZG: 1.0000 | LOZENGE | OROMUCOSAL | Status: DC | PRN

## 2023-03-27 MED ORDER — PROCHLORPERAZINE MALEATE 5 MG PO TABS: 5.0000 mg | ORAL_TABLET | Freq: Four times a day (QID) | ORAL | Status: DC | PRN

## 2023-03-27 MED ORDER — ORAL CARE MOUTH RINSE
15.0000 mL | OROMUCOSAL | Status: DC
  Administered 2023-03-27 – 2023-04-08 (×39): 15 mL via OROMUCOSAL

## 2023-03-27 MED ORDER — NYSTATIN 100000 UNIT/ML MT SUSP
5.0000 mL | Freq: Four times a day (QID) | OROMUCOSAL | Status: DC
  Administered 2023-03-27 – 2023-04-08 (×45): 500000 [IU] via ORAL
  Filled 2023-03-27 (×45): qty 5

## 2023-03-27 MED ORDER — ALUM & MAG HYDROXIDE-SIMETH 200-200-20 MG/5ML PO SUSP: 30.0000 mL | ORAL | Status: DC | PRN

## 2023-03-27 MED ORDER — FLECAINIDE ACETATE 50 MG PO TABS
75.0000 mg | ORAL_TABLET | Freq: Two times a day (BID) | ORAL | Status: DC
  Administered 2023-03-27 – 2023-04-08 (×24): 75 mg via ORAL
  Filled 2023-03-27 (×25): qty 2

## 2023-03-27 MED ORDER — SODIUM CHLORIDE 0.9 % IV SOLN: 1.5000 g | Freq: Three times a day (TID) | INTRAVENOUS | Status: DC

## 2023-03-27 NOTE — H&P (Signed)
Physical Medicine and Rehabilitation Admission H&P        Chief Complaint  Patient presents with   Functional deficits due to L hip fracture      HPI:  Michelle Vazquez is a 89 year old female with history of CAF/PSVT- on flecainide, hypercontractile esophagus, Schatzki's ring (Dr. Rhea Belton), CKD III, parathyroid adenoma, stress incontinence/nocturia;  who was admitted to [facility] on 03/21/23 after fall with onset left hip pain and inability to walk or bear weight on Left leg. She was found to have mildly elevated WBC-11.6 with  2D echo done showing EF 55-60% with no wall abnormality, severely dilated LA with grade 2 DD and myxomatous mitral valve.  She was found to have impacted left femoral neck fracture and was underwent left anterior hip hemi by Dr. Magnus Ivan on 03/23/23. Post op WBAT and on low dose ASA bid for DVT prophylaxis. Post op on 05/18, she  hypotension with BP down to 70/52 with standing attempts and she was bolused with 1 Liter IVF. On 05/19, she developed acute hypoxic respiratory failure with congested cough and need for supplemental oxygen. CXR done showing new pleural effusion with LLL airspace disease. She was started on IV unasyn due to concerns of PNA. Cough improving but continues to have throat discomfort affecting intake.   ABLA being monitored. BP improved and pain is controlled. PT/OT has been working with patient who is limited by hypoxia, balance deficits, debility and decreased balance. CIR recommended due to functional decline.    Pt reports LBM yesterday- first day since surgery had BM. Has a cough whenever tries to swallow fluids, even when we tried nectar thick liquids when doing admission. On 2L O2- breathing well unless drinks.  Pain moderate, but usually only taking tylenol- added Norco, per granddaughter.  Says lives on edge of dehydration due to difficulty swallowing fluids. Milk is easiest to swallow.  Cannot lay flat, due to choking feeling.      Review of  Systems  Constitutional:  Positive for malaise/fatigue. Negative for chills and fever.  HENT: Negative.    Eyes: Negative.   Respiratory:  Positive for cough, sputum production and shortness of breath. Negative for wheezing.   Cardiovascular:  Positive for leg swelling. Negative for chest pain and orthopnea.  Gastrointestinal:  Negative for constipation, diarrhea and vomiting.  Genitourinary:  Positive for frequency and urgency. Negative for dysuria, flank pain and hematuria.  Musculoskeletal:  Positive for falls and joint pain. Negative for neck pain.  Skin: Negative.   Neurological:  Positive for focal weakness. Negative for tingling, speech change and seizures.  Endo/Heme/Allergies: Negative.   Psychiatric/Behavioral:  Negative for depression, substance abuse and suicidal ideas. The patient has insomnia.   All other systems reviewed and are negative.           Past Medical History:  Diagnosis Date   Arthritis     Atrial fibrillation (HCC)     Bowel obstruction (HCC) 2000   Cataract      bilateral removed   Complication of anesthesia      hard to wake after hysterectomy, states heart stopped during a Esophagoscopy, but states she has had several surgeries and procedures with anesthesia and has never had that again   Dysrhythmia     HLD (hyperlipidemia)     Hypercontractile esophagus     Mitral valve prolapse     Osteoporosis      declines rx and declines DEXA f/u   Parathyroid  adenoma      post parathyroid surgery in October of 2008   Paroxysmal supraventricular tachycardia     Pneumonia      as a child   Premature ventricular contractions (PVCs) (VPCs)     Schatzki's ring 08/2013    Dr. Rhea Belton, s/p dilation 08/2013, ?benefit   Squamous cell carcinoma in situ of skin      face   Urinary incontinence             Past Surgical History:  Procedure Laterality Date   ABDOMINAL HYSTERECTOMY   1989   BREAST BIOPSY   1979    left   CATARACT EXTRACTION        bilateral    ESOPHAGEAL MANOMETRY N/A 11/17/2015    Procedure: ESOPHAGEAL MANOMETRY (EM);  Surgeon: Beverley Fiedler, MD;  Location: [facility];  Service: Gastroenterology;  Laterality: N/A;   ESOPHAGOSCOPY   09/17/2019    Procedure: Cervical Esophagoscopy;  Surgeon: Osborn Coho, MD;  Location: [facility];  Service: ENT;;   EYE SURGERY       LAPAROSCOPIC SMALL BOWEL RESECTION   2000   LARYNGOSCOPY AND ESOPHAGOSCOPY N/A 10/21/2021    Procedure: DIRECT LARYNGOSCOPY AND ESOPHAGOSCOPY WITH DILATION OF ESOPHAGEAL NARROWING;  Surgeon: Osborn Coho, MD;  Location: [facility];  Service: ENT;  Laterality: N/A;   LARYNGOSCOPY AND ESOPHAGOSCOPY N/A 07/26/2022    Procedure: DIRECT LARYNGOSCOPY; ESOPHAGOSCOPY AND ESOPHAGEAL DILATION;  Surgeon: Osborn Coho, MD;  Location: [facility] OR;  Service: ENT;  Laterality: N/A;   MICROLARYNGOSCOPY WITH DILATION N/A 09/17/2019    Procedure: MICROLARYNGOSCOPY WITH DILATION;  Surgeon: Osborn Coho, MD;  Location: [facility] OR;  Service: ENT;  Laterality: N/A;   parathyroid surgery   2008?    left   SHOULDER SURGERY   02/1999    right   SKIN LESION EXCISION   08/1999    left side of face   TOTAL HIP ARTHROPLASTY Left 03/23/2023    Procedure: TOTAL HIP ARTHROPLASTY ANTERIOR APPROACH;  Surgeon: Kathryne Hitch, MD;  Location: [facility];  Service: Orthopedics;  Laterality: Left;           Family History  Problem Relation Age of Onset   Arthritis Father     Heart disease Father     Hypertension Father     Stroke Father     Heart attack Father     Arthritis Mother     Stroke Mother     Stroke Brother     Diabetes Brother     Hypertension Brother     Colon cancer Sister          in her 14's   Hyperlipidemia Sister     Hypertension Sister     Hypertension Sister     Diabetes Sister     Esophageal cancer Neg Hx     Rectal cancer Neg Hx     Stomach cancer Neg Hx        Social History:  reports that she has never smoked. She has never used smokeless tobacco. She reports that  she does not drink alcohol and does not use drugs.          Allergies  Allergen Reactions   Diltiazem Hcl Other (See Comments)      Rapid heartbeat and elevated blood pressure   Inderal [Propranolol Hcl]        Pt does not remember reaction   Lopressor [Metoprolol Tartrate]        Per the pt' "  I felt like I was going to pass out"   Penicillins Diarrhea      "That was 50 years ago"             Medications Prior to Admission  Medication Sig Dispense Refill   Calcium-Vitamin D-Vitamin K (VIACTIV CALCIUM PLUS D) 650-12.5-40 MG-MCG-MCG CHEW Chew 1 each by mouth daily.       flecainide (TAMBOCOR) 50 MG tablet TAKE 1 & 1/2 (ONE & ONE-HALF) TABLETS BY MOUTH TWICE DAILY (Patient taking differently: Take 75 mg by mouth 2 (two) times daily.) 270 tablet 0   Multiple Vitamin (MULTIVITAMIN WITH MINERALS) TABS tablet Take 1 tablet by mouth daily. gummies       Multiple Vitamins-Minerals (PRESERVISION AREDS 2+MULTI VIT PO) Take 1 capsule by mouth in the morning and at bedtime.              Home: Home Living Family/patient expects to be discharged to:: Unsure Living Arrangements: Other relatives (granddaughter-works) Available Help at Discharge: Family Type of Home: House Home Access: Stairs to enter Secretary/administrator of Steps: 4-5 Entrance Stairs-Rails: Right Home Layout: One level Bathroom Shower/Tub: Health visitor: Standard Bathroom Accessibility: Yes Home Equipment: None  Lives With: Alone   Functional History: Prior Function Prior Level of Function : Independent/Modified Independent   Functional Status:  Mobility: Bed Mobility Overal bed mobility: Needs Assistance Bed Mobility: Supine to Sit Supine to sit: Min assist, HOB elevated Sit to supine: Mod assist, HOB elevated General bed mobility comments: oob in recliner Transfers Overall transfer level: Needs assistance Equipment used: Rolling walker (2 wheels) Transfers: Sit to/from Stand Sit to  Stand: Min assist Bed to/from chair/wheelchair/BSC transfer type:: Step pivot Step pivot transfers: Min assist  Lateral/Scoot Transfers: Min assist General transfer comment: Assist to rise, stabilize, control descent. VCs for safety, technique, hand placement. Increased time. Ambulation/Gait Ambulation/Gait assistance: Min assist, Min guard Gait Distance (Feet): 40 Feet Assistive device: Rolling walker (2 wheels) Gait Pattern/deviations: Step-to pattern General Gait Details: Cues for safety, technique, sequence, posture, step length, RW proximity. Assist to stabilize pt throughout distance. Followed closely with recliner. Seated rest break end of distance; on 2L O2 with SpO2 >93% throughout session   ADL: ADL Overall ADL's : Needs assistance/impaired Eating/Feeding: Independent Grooming: Set up, Sitting Upper Body Bathing: Set up, Sitting Lower Body Bathing: Moderate assistance, Sitting/lateral leans Upper Body Dressing : Set up, Sitting Lower Body Dressing: Maximal assistance, Sit to/from stand Toilet Transfer: Minimal assistance, BSC/3in1, Rolling walker (2 wheels) Toileting- Clothing Manipulation and Hygiene: Sit to/from stand, Moderate assistance Functional mobility during ADLs: Moderate assistance, Rolling walker (2 wheels)   Cognition: Cognition Overall Cognitive Status: Within Functional Limits for tasks assessed Orientation Level: Oriented X4 Cognition Arousal/Alertness: Awake/alert Behavior During Therapy: WFL for tasks assessed/performed Overall Cognitive Status: Within Functional Limits for tasks assessed General Comments: tends to defer to family     Blood pressure (!) 141/78, pulse 90, temperature 97.6 F (36.4 C), temperature source Oral, resp. rate 16, height 5\' 1"  (1.549 m), weight 45.4 kg, SpO2 100 %. Physical Exam Vitals and nursing note reviewed. Exam conducted with a chaperone present.  Constitutional:      Appearance: Normal appearance. She is normal  weight.     Interventions: Nasal cannula in place.     Comments: Pt awake, alert, appropriate, granddaughter at bedside; pt on 2L O2 by [place], appears younger than stated age, NAD, sitting up in bedside chair  HENT:     Head: Normocephalic and  atraumatic.     Right Ear: External ear normal.     Left Ear: External ear normal.     Nose: Nose normal. No congestion.     Mouth/Throat:     Mouth: Mucous membranes are dry.     Pharynx: Oropharynx is clear. No oropharyngeal exudate.  Eyes:     General:        Right eye: No discharge.        Left eye: No discharge.     Extraocular Movements: Extraocular movements intact.  Cardiovascular:     Rate and Rhythm: Normal rate. Rhythm irregular.     Heart sounds: Normal heart sounds. No murmur heard.    No gallop.  Pulmonary:     Comments: Decreased at bases. Intermittent cough with dysphonia.  Coughed more when attempted nectar thick liquids.  Abdominal:     General: Abdomen is flat. Bowel sounds are normal. There is no distension.     Palpations: Abdomen is soft.     Tenderness: There is no abdominal tenderness.  Musculoskeletal:     Cervical back: Neck supple. No tenderness.     Comments: Left hip incision with surgical foam dressing. Min edema left hip and thigh. Linear ecchymotic area upper right thigh.    UE strength 5-/5 throughout B/L RLE 5-/5 and LLE 2/5 in HF, otherwise 5-/5  Skin:    General: Skin is warm and dry.  Neurological:     Mental Status: She is alert and oriented to person, place, and time.     Comments: Intact to light touch in all  4 extremities  Psychiatric:        Mood and Affect: Mood normal.        Behavior: Behavior normal.        Lab Results Last 48 Hours        Results for orders placed or performed during the hospital encounter of 03/21/23 (from the past 48 hour(s))  Glucose, capillary     Status: Abnormal    Collection Time: 03/26/23 11:59 AM  Result Value Ref Range    Glucose-Capillary 115 (H) 70 - 99  mg/dL      Comment: Glucose reference range applies only to samples taken after fasting for at least 8 hours.  CBC with Differential/Platelet     Status: Abnormal    Collection Time: 03/27/23  3:36 AM  Result Value Ref Range    WBC 7.1 4.0 - 10.5 K/uL    RBC 2.89 (L) 3.87 - 5.11 MIL/uL    Hemoglobin 9.1 (L) 12.0 - 15.0 g/dL    HCT 45.4 (L) 09.8 - 46.0 %    MCV 97.2 80.0 - 100.0 fL    MCH 31.5 26.0 - 34.0 pg    MCHC 32.4 30.0 - 36.0 g/dL    RDW 11.9 14.7 - 82.9 %    Platelets 182 150 - 400 K/uL    nRBC 0.0 0.0 - 0.2 %    Neutrophils Relative % 63 %    Neutro Abs 4.5 1.7 - 7.7 K/uL    Lymphocytes Relative 22 %    Lymphs Abs 1.6 0.7 - 4.0 K/uL    Monocytes Relative 12 %    Monocytes Absolute 0.9 0.1 - 1.0 K/uL    Eosinophils Relative 2 %    Eosinophils Absolute 0.1 0.0 - 0.5 K/uL    Basophils Relative 0 %    Basophils Absolute 0.0 0.0 - 0.1 K/uL    Immature Granulocytes 1 %  Abs Immature Granulocytes 0.04 0.00 - 0.07 K/uL      Comment: Performed at [facility], [address] [place], [place] [address]  Basic metabolic panel     Status: Abnormal    Collection Time: 03/27/23  3:36 AM  Result Value Ref Range    Sodium 136 135 - 145 mmol/L    Potassium 3.7 3.5 - 5.1 mmol/L    Chloride 105 98 - 111 mmol/L    CO2 26 22 - 32 mmol/L    Glucose, Bld 109 (H) 70 - 99 mg/dL      Comment: Glucose reference range applies only to samples taken after fasting for at least 8 hours.    BUN 21 8 - 23 mg/dL    Creatinine, Ser 6.04 0.44 - 1.00 mg/dL    Calcium 7.6 (L) 8.9 - 10.3 mg/dL    GFR, Estimated >54 >09 mL/min      Comment: (NOTE) Calculated using the CKD-EPI Creatinine Equation (2021)      Anion gap 5 5 - 15      Comment: Performed at [facility], [address], [place], [place] [address]      Imaging Results (Last 48 hours)  No results found.         Blood pressure (!) 141/78, pulse 90, temperature 97.6 F (36.4 C), temperature  source Oral, resp. rate 16, height 5\' 1"  (1.549 m), weight 45.4 kg, SpO2 100 %.   Medical Problem List and Plan: 1. Functional deficits secondary to L femoral neck fx s/p hemiarthroplasty             -patient may  shower with covering incision             -ELOS/Goals: 10-12 days mod I to supervision 2.  Antithrombotics: -DVT/anticoagulation:  SCDs/TEDs/ASA              -antiplatelet therapy: ASA 81 mg po BID 3. Pain Management: Hydrocodone prn vs tylenol prn.  4. Mood/Behavior/Sleep: LCSW to follow for evaluation and support.              -antipsychotic agents: N/A 5. Neuropsych/cognition: This patient is capable of making decisions on her own behalf. 6. Skin/Wound Care: Routine pressure relief measures.  7. Fluids/Electrolytes/Nutrition:  Monitor I/O. Check CMET in am. 8. Left hip subcapital femoral neck  fx s/p anterior hemi: WBAT 9. Aspiration PNA: Continues to have frequent cough-->will check f/u CXR to rule out underlying overload.             --Continue Unasyn D# 3/5-7?             -con't 2L O2 by [place]- wean as tolerated 10. Severe GERD: On Protonix IV BID-->will change to oral route 11. Chronic dysphagia/Esophageal stricture: Is able to modify diet             --last swallow study without evidence of aspiration and no change in stricture for past 4 years             --does not like Ensure--make her own shakes at home. Will try [employer] supplement. Pt CAN use nectar thick liquids if she wants, however can stay on thin liquids.   12. CAF: Monitor HR TID--continue Tambocor 13. ABLA: Recheck CBC in am. 14. Fluid overload: Monitor weight daily. Encourage low salt diet.                Jacquelynn Cree, PA-C 03/27/2023     I have  personally performed a face to face diagnostic evaluation of this patient and formulated the key components of the plan.  Additionally, I have personally reviewed laboratory data, imaging studies, as well as relevant notes and concur with the physician  assistant's documentation above.   The patient's status has not changed from the original H&P.  Any changes in documentation from the acute care chart have been noted above.

## 2023-03-27 NOTE — Care Management Important Message (Signed)
Important Message  Patient Details IM Letter given. Name: Michelle Vazquez MRN: [id number] Date of Birth: 1929-05-06   Medicare Important Message Given:  Yes     Caren Macadam 03/27/2023, 11:59 AM

## 2023-03-27 NOTE — Progress Notes (Signed)
PMR Admission Coordinator Pre-Admission Assessment  Patient: Michelle Vazquez is an 89 y.o., female MRN: [id number] DOB: 06/15/1929 Height: 5' 1" (154.9 cm) Weight: 45.4 kg  Insurance Information HMO:     PPO:      PCP:      IPA:      80/20:  no    OTHER:  PRIMARY: Medicare A and B Policy#: [id number] Subscriber: Pt. Phone#: Verified online    Fax#:  Pre-Cert#:       Employer:  Benefits:  Phone #:      Name:  Eff. Date: Parts A and B effective 10/06/1996  Deduct: $1632      Out of Pocket Max:  None      Life Max: N/A  CIR: 100%      SNF: 100 days Outpatient: 80%     Co-Pay: 20% Home Health58: 100%      Co-Pay: none DME: 80%     Co-Pay: 20% Providers: patient's choice  SECONDARY: A[employer]      Policy#: [id number]      Phone#:   Financial Counselor:       Phone#:   The "Data Collection Information Summary" for patients in Inpatient Rehabilitation Facilities with attached "Privacy Act Statement-Health Care Records" was provided and verbally reviewed with: Patient  Emergency Contact Information Contact Information     Name Relation Home Work Mobile   Krieg,Mary Daughter [phone]   KRIEG,MARK Relative [phone]         Current Medical History  Patient Admitting Diagnosis: Hip fx History of Present Illness: Pt. Is a 89 y.o. female with past medical history significant for osteoarthritis, chronic atrial fibrillation, paroxysmal SVT, history of bowel obstruction, bilateral cataracts, bilateral cataract surgery, hyperlipidemia, hypercontractile esophagus, mitral valve prolapse, osteoporosis, parathyroid adenoma, history of pneumonia as a child, face squamous cell CA, and urinary incontinence  who presented to [facility] ED on 03/1523 after mechanical fall on the left hip. She developed immediate pain and inability to bear weight on her  left hip. In the ED, chemistries and vitals were as follows:CBC 0 white count 11.8, hemoglobin 13.7 g deciliter  platelets 258. BMP with a glucose of 122 and BUN of 30 mg/dL with a calculated GFR of 55 mL/min. The rest of the BMP measurements were normal. Initial vital signs were temperature 97.7 F, pulse 98, respiration 18, BP 155/98 mmHg O2 sat 97% on room air.  The was started on LR at 125 mL an hour and received a tablet of Percocet 5/325 mg x 1. Portable 1 view chest radiograph with no focal airspace opacity on admission. Left hip x-ray showed a subcapital impaction fracture of the left femoral neck. Pt. Underwent Left direct anterior hip hemiarthroplasty  with orthopedic surgery on 03/23/23. Post op, Pt. Developed respiratory failure with hypoxia,. Chest x tray showed a new pleural effusion. There was also concern for aspiration and Pt.was started on IV Unasyn for PNA. Pt. Seen by PT/OT and they recommend CIR to assist return to PLOF.    Patient's medical record from [facility] has been reviewed by the rehabilitation admission coordinator and physician.  Past Medical History  Past Medical History:  Diagnosis Date   Arthritis    Atrial fibrillation (HCC)    Bowel obstruction (HCC) 2000   Cataract    bilateral removed   Complication of anesthesia    hard to wake after hysterectomy, states heart stopped during a Esophagoscopy, but states she has had several surgeries and   procedures with anesthesia and has never had that again   Dysrhythmia    HLD (hyperlipidemia)    Hypercontractile esophagus    Mitral valve prolapse    Osteoporosis    declines rx and declines DEXA f/u   Parathyroid adenoma    post parathyroid surgery in October of 2008   Paroxysmal supraventricular tachycardia    Pneumonia    as a child   Premature ventricular contractions (PVCs) (VPCs)    Schatzki's ring 08/2013   Dr. Pyrtle, s/p dilation 08/2013, ?benefit   Squamous cell carcinoma in situ of skin    face   Urinary incontinence     Has the patient had major surgery during 100 days prior to admission?  No  Family History   family history includes Arthritis in her father and mother; Colon cancer in her sister; Diabetes in her brother and sister; Heart attack in her father; Heart disease in her father; Hyperlipidemia in her sister; Hypertension in her brother, father, sister, and sister; Stroke in her brother, father, and mother.  Current Medications  Current Facility-Administered Medications:    0.9 %  sodium chloride infusion, , Intravenous, Continuous, Feliz Ortiz, Abraham, MD, Stopped at 03/24/23 1019   acetaminophen (TYLENOL) tablet 650 mg, 650 mg, Oral, Q6H PRN, 650 mg at 03/26/23 2325 **OR** acetaminophen (TYLENOL) suppository 650 mg, 650 mg, Rectal, Q6H PRN, Blackman, Christopher Y, MD   alum & mag hydroxide-simeth (MAALOX/MYLANTA) 200-200-20 MG/5ML suspension 30 mL, 30 mL, Oral, Q4H PRN, Blackman, Christopher Y, MD   ampicillin-sulbactam (UNASYN) 1.5 g in sodium chloride 0.9 % 100 mL IVPB, 1.5 g, Intravenous, Q8H, Poindexter, Leann T, RPH, Last Rate: 200 mL/hr at 03/27/23 0640, 1.5 g at 03/27/23 0640   aspirin chewable tablet 81 mg, 81 mg, Oral, BID, Blackman, Christopher Y, MD, 81 mg at 03/26/23 2258   diphenhydrAMINE (BENADRYL) 12.5 MG/5ML elixir 12.5-25 mg, 12.5-25 mg, Oral, Q4H PRN, Blackman, Christopher Y, MD   docusate (COLACE) 50 MG/5ML liquid 100 mg, 100 mg, Oral, BID, Feliz Ortiz, Abraham, MD, 100 mg at 03/26/23 0924   feeding supplement (ENSURE ENLIVE / ENSURE PLUS) liquid 237 mL, 237 mL, Oral, BID BM, Blackman, Christopher Y, MD, 237 mL at 03/26/23 0901   flecainide (TAMBOCOR) tablet 75 mg, 75 mg, Oral, BID, Blackman, Christopher Y, MD, 75 mg at 03/26/23 2010   HYDROcodone-acetaminophen (NORCO) 7.5-325 MG per tablet 1-2 tablet, 1-2 tablet, Oral, Q4H PRN, Blackman, Christopher Y, MD   HYDROcodone-acetaminophen (NORCO/VICODIN) 5-325 MG per tablet 1-2 tablet, 1-2 tablet, Oral, Q4H PRN, Blackman, Christopher Y, MD   magnesium hydroxide (MILK OF MAGNESIA) suspension 30 mL, 30 mL,  Oral, Daily PRN, Blackman, Christopher Y, MD   menthol-cetylpyridinium (CEPACOL) lozenge 3 mg, 1 lozenge, Oral, PRN **OR** phenol (CHLORASEPTIC) mouth spray 1 spray, 1 spray, Mouth/Throat, PRN, Blackman, Christopher Y, MD, 1 spray at 03/25/23 2209   methocarbamol (ROBAXIN) tablet 500 mg, 500 mg, Oral, Q6H PRN **OR** methocarbamol (ROBAXIN) 500 mg in dextrose 5 % 50 mL IVPB, 500 mg, Intravenous, Q6H PRN, Blackman, Christopher Y, MD, Stopped at 03/23/23 1939   metoCLOPramide (REGLAN) tablet 5-10 mg, 5-10 mg, Oral, Q8H PRN **OR** metoCLOPramide (REGLAN) injection 5-10 mg, 5-10 mg, Intravenous, Q8H PRN, Blackman, Christopher Y, MD   morphine (PF) 2 MG/ML injection 0.5-1 mg, 0.5-1 mg, Intravenous, Q2H PRN, Blackman, Christopher Y, MD   Multivitamin Adult CHEW 1 Dose, 1 Dose, Oral, Daily, Feliz Ortiz, Abraham, MD, 1 Dose at 03/26/23 1851   Oral care mouth rinse, 15 mL, Mouth   Rinse, 4 times per day, Blackman, Christopher Y, MD, 15 mL at 03/26/23 2205   Oral care mouth rinse, 15 mL, Mouth Rinse, PRN, Blackman, Christopher Y, MD   pantoprazole (PROTONIX) injection 40 mg, 40 mg, Intravenous, Q12H, Feliz Ortiz, Abraham, MD, 40 mg at 03/26/23 2258   prochlorperazine (COMPAZINE) injection 5 mg, 5 mg, Intravenous, Q4H PRN, Blackman, Christopher Y, MD   protein supplement (UNJURY PLANTED PROTEIN) powder 2 oz, 2 oz, Oral, QID, Feliz Ortiz, Abraham, MD, 2 oz at 03/26/23 0905  Patients Current Diet:  Diet Order             Diet - low sodium heart healthy           DIET DYS 3 Room service appropriate? Yes; Fluid consistency: Thin  Diet effective now                   Precautions / Restrictions Precautions Precautions: Fall Precaution Comments: monitor O2 Restrictions Weight Bearing Restrictions: No LLE Weight Bearing: Weight bearing as tolerated   Has the patient had 2 or more falls or a fall with injury in the past year? Yes  Prior Activity Level Community (5-7x/wk): Pt. active in the community  PTA  Prior Functional Level Self Care: Did the patient need help bathing, dressing, using the toilet or eating? Independent  Indoor Mobility: Did the patient need assistance with walking from room to room (with or without device)? Independent  Stairs: Did the patient need assistance with internal or external stairs (with or without device)? Independent  Functional Cognition: Did the patient need help planning regular tasks such as shopping or remembering to take medications? Independent  Patient Information Are you of Hispanic, Latino/a,or Spanish origin?: A. No, not of Hispanic, Latino/a, or Spanish origin What is your race?: A. White Do you need or want an interpreter to communicate with a doctor or health care staff?: 0. No  Patient's Response To:  Health Literacy and Transportation Is the patient able to respond to health literacy and transportation needs?: Yes Health Literacy - How often do you need to have someone help you when you read instructions, pamphlets, or other written material from your doctor or pharmacy?: Never In the past 12 months, has lack of transportation kept you from medical appointments or from getting medications?: No In the past 12 months, has lack of transportation kept you from meetings, work, or from getting things needed for daily living?: No  Home Assistive Devices / Equipment Home Assistive Devices/Equipment: Blood pressure cuff, Eyeglasses, Dentures (specify type) Home Equipment: None  Prior Device Use: Indicate devices/aids used by the patient prior to current illness, exacerbation or injury? None of the above  Current Functional Level Cognition  Overall Cognitive Status: Within Functional Limits for tasks assessed Orientation Level: Oriented X4 General Comments: tends to defer to family    Extremity Assessment (includes Sensation/Coordination)  Upper Extremity Assessment: Overall WFL for tasks assessed  Lower Extremity Assessment: Defer to PT  evaluation    ADLs  Overall ADL's : Needs assistance/impaired Eating/Feeding: Independent Grooming: Set up, Sitting Upper Body Bathing: Set up, Sitting Lower Body Bathing: Moderate assistance, Sitting/lateral leans Upper Body Dressing : Set up, Sitting Lower Body Dressing: Maximal assistance, Sit to/from stand Toilet Transfer: Minimal assistance, BSC/3in1, Rolling walker (2 wheels) Toileting- Clothing Manipulation and Hygiene: Sit to/from stand, Moderate assistance Functional mobility during ADLs: Moderate assistance, Rolling walker (2 wheels)    Mobility  Overal bed mobility: Needs Assistance Bed Mobility: Supine to Sit Supine   to sit: Min assist, HOB elevated Sit to supine: Mod assist, HOB elevated General bed mobility comments: oob in recliner    Transfers  Overall transfer level: Needs assistance Equipment used: Rolling walker (2 wheels) Transfers: Sit to/from Stand Sit to Stand: Min assist Bed to/from chair/wheelchair/BSC transfer type:: Step pivot Step pivot transfers: Min assist  Lateral/Scoot Transfers: Min assist General transfer comment: Assist to rise, stabilize, control descent. VCs for safety, technique, hand placement. Increased time.    Ambulation / Gait / Stairs / Wheelchair Mobility  Ambulation/Gait Ambulation/Gait assistance: Min assist, Min guard Gait Distance (Feet): 40 Feet Assistive device: Rolling walker (2 wheels) Gait Pattern/deviations: Step-to pattern General Gait Details: Cues for safety, technique, sequence, posture, step length, RW proximity. Assist to stabilize pt throughout distance. Followed closely with recliner. Seated rest break end of distance; on 2L O2 with SpO2 >93% throughout session    Posture / Balance Balance Overall balance assessment: Needs assistance Sitting-balance support: No upper extremity supported, Feet supported Sitting balance-Leahy Scale: Fair Standing balance support: During functional activity, Reliant on assistive  device for balance Standing balance-Leahy Scale: Poor    Special needs/care consideration Skin surgical incision   Previous Home Environment (from acute therapy documentation) Living Arrangements: Other relatives (granddaughter-works)  Lives With: Alone Available Help at Discharge: Family Type of Home: House Home Layout: One level Home Access: Stairs to enter Entrance Stairs-Rails: Right Entrance Stairs-Number of Steps: 4-5 Bathroom Shower/Tub: Walk-in shower Bathroom Toilet: Standard Bathroom Accessibility: Yes How Accessible: Accessible via walker Home Care Services: No  Discharge Living Setting Plans for Discharge Living Setting: Patient's home Type of Home at Discharge: House Discharge Home Layout: One level Discharge Home Access: Stairs to enter Entrance Stairs-Rails: Right Entrance Stairs-Number of Steps: 4-5 Discharge Bathroom Shower/Tub: Walk-in shower Discharge Bathroom Toilet: Standard Discharge Bathroom Accessibility: Yes How Accessible: Accessible via walker, Accessible via wheelchair Does the patient have any problems obtaining your medications?: No  Social/Family/Support Systems Patient Roles: Other (Comment) Contact Information: [phone] Anticipated Caregiver: Mary Kreig Anticipated Caregiver's Contact Information: [phone] Ability/Limitations of Caregiver: Mary, daughter states that she, her husband and their daughter are going to rotate to provide 24/7 care Caregiver Availability: 24/7 Discharge Plan Discussed with Primary Caregiver: Yes Is Caregiver In Agreement with Plan?: Yes Does Caregiver/Family have Issues with Lodging/Transportation while Pt is in Rehab?: No  Goals Patient/Family Goal for Rehab: PT/OT Supervision to mod I Expected length of stay: 10-12 days Pt/Family Agrees to Admission and willing to participate: Yes Program Orientation Provided & Reviewed with Pt/Caregiver Including Roles  & Responsibilities: Yes  Decrease burden of  Care through IP rehab admission: not anticipated   Possible need for SNF placement upon discharge: not anticipated  Patient Condition: I have reviewed medical records from [facility], spoken with CM, and patient and daughter. I discussed via phone for inpatient rehabilitation assessment.  Patient will benefit from ongoing PT and OT, can actively participate in 3 hours of therapy a day 5 days of the week, and can make measurable gains during the admission.  Patient will also benefit from the coordinated team approach during an Inpatient Acute Rehabilitation admission.  The patient will receive intensive therapy as well as Rehabilitation physician, nursing, social worker, and care management interventions.  Due to safety, skin/wound care, disease management, medication administration, pain management, and patient education the patient requires 24 hour a day rehabilitation nursing.  The patient is currently min A with mobility and basic ADLs.  Discharge setting and therapy post discharge at home   with home health is anticipated.  Patient has agreed to participate in the Acute Inpatient Rehabilitation Program and will admit today.  Preadmission Screen Completed By:  Naithen Rivenburg B Lounette Sloan, 03/27/2023 8:13 AM ______________________________________________________________________   Discussed status with Dr. Lovorn  on 03/27/23 at 1027 and received approval for admission today.  Admission Coordinator:  Krishav Mamone B Alika Eppes, CCC-SLP, time 1027/Date 03/27/23   Assessment/Plan: Diagnosis: Does the need for close, 24 hr/day Medical supervision in concert with the patient's rehab needs make it unreasonable for this patient to be served in a less intensive setting? Yes Co-Morbidities requiring supervision/potential complications: L hip fx, PNA- Afib, pSVT, esophageal dysphagia, urinary incontinence Due to bladder management, bowel management, safety, skin/wound care, disease management, medication administration, pain  management, and patient education, does the patient require 24 hr/day rehab nursing? Yes Does the patient require coordinated care of a physician, rehab nurse, PT, OT, and SLP to address physical and functional deficits in the context of the above medical diagnosis(es)? Yes Addressing deficits in the following areas: balance, endurance, locomotion, strength, transferring, bowel/bladder control, bathing, dressing, feeding, grooming, and toileting Can the patient actively participate in an intensive therapy program of at least 3 hrs of therapy 5 days a week? Yes The potential for patient to make measurable gains while on inpatient rehab is good Anticipated functional outcomes upon discharge from inpatient rehab: modified independent and supervision PT, modified independent and supervision OT, n/a SLP Estimated rehab length of stay to reach the above functional goals is: 10-12 days Anticipated discharge destination: Home 10. Overall Rehab/Functional Prognosis: good   MD Signature:  

## 2023-03-27 NOTE — H&P (Signed)
Physical Medicine and Rehabilitation Admission H&P    Chief Complaint  Patient presents with   Functional deficits due to L hip fracture    HPI:  Michelle Vazquez. Michelle Vazquez is a 89 year old female with history of CAF/PSVT- on flecainide, hypercontractile esophagus, Schatzki's ring (Dr. Rhea Belton), CKD III, parathyroid adenoma, stress incontinence/nocturia;  who was admitted to [facility] on 03/21/23 after fall with onset left hip pain and inability to walk or bear weight on Left leg. She was found to have mildly elevated WBC-11.6 with  2D echo done showing EF 55-60% with no wall abnormality, severely dilated LA with grade 2 DD and myxomatous mitral valve.  She was found to have impacted left femoral neck fracture and was underwent left anterior hip hemi by Dr. Magnus Vazquez on 03/23/23. Post op WBAT and on low dose ASA bid for DVT prophylaxis. Post op on 05/18, she  hypotension with BP down to 70/52 with standing attempts and she was bolused with 1 Liter IVF. On 05/19, she developed acute hypoxic respiratory failure with congested cough and need for supplemental oxygen. CXR done showing new pleural effusion with LLL airspace disease. She was started on IV unasyn due to concerns of PNA. Cough improving but continues to have throat discomfort affecting intake.  ABLA being monitored. BP improved and pain is controlled. PT/OT has been working with patient who is limited by hypoxia, balance deficits, debility and decreased balance. CIR recommended due to functional decline.   Pt reports LBM yesterday- first day since surgery had BM. Has a cough whenever tries to swallow fluids, even when we tried nectar thick liquids when doing admission. On 2L O2- breathing well unless drinks.  Pain moderate, but usually only taking tylenol- added Norco, per granddaughter.  Says lives on edge of dehydration due to difficulty swallowing fluids. Milk is easiest to swallow.  Cannot lay flat, due to choking feeling.     Review of Systems   Constitutional:  Positive for malaise/fatigue. Negative for chills and fever.  HENT: Negative.    Eyes: Negative.   Respiratory:  Positive for cough, sputum production and shortness of breath. Negative for wheezing.   Cardiovascular:  Positive for leg swelling. Negative for chest pain and orthopnea.  Gastrointestinal:  Negative for constipation, diarrhea and vomiting.  Genitourinary:  Positive for frequency and urgency. Negative for dysuria, flank pain and hematuria.  Musculoskeletal:  Positive for falls and joint pain. Negative for neck pain.  Skin: Negative.   Neurological:  Positive for focal weakness. Negative for tingling, speech change and seizures.  Endo/Heme/Allergies: Negative.   Psychiatric/Behavioral:  Negative for depression, substance abuse and suicidal ideas. The patient has insomnia.   All other systems reviewed and are negative.    Past Medical History:  Diagnosis Date   Arthritis    Atrial fibrillation (HCC)    Bowel obstruction (HCC) 2000   Cataract    bilateral removed   Complication of anesthesia    hard to wake after hysterectomy, states heart stopped during a Esophagoscopy, but states she has had several surgeries and procedures with anesthesia and has never had that again   Dysrhythmia    HLD (hyperlipidemia)    Hypercontractile esophagus    Mitral valve prolapse    Osteoporosis    declines rx and declines DEXA f/u   Parathyroid adenoma    post parathyroid surgery in October of 2008   Paroxysmal supraventricular tachycardia    Pneumonia    as a child   Premature ventricular  contractions (PVCs) (VPCs)    Schatzki's ring 08/2013   Dr. Rhea Belton, s/p dilation 08/2013, ?benefit   Squamous cell carcinoma in situ of skin    face   Urinary incontinence     Past Surgical History:  Procedure Laterality Date   ABDOMINAL HYSTERECTOMY  1989   BREAST BIOPSY  1979   left   CATARACT EXTRACTION     bilateral   ESOPHAGEAL MANOMETRY N/A 11/17/2015   Procedure:  ESOPHAGEAL MANOMETRY (EM);  Surgeon: Beverley Fiedler, MD;  Location: [facility];  Service: Gastroenterology;  Laterality: N/A;   ESOPHAGOSCOPY  09/17/2019   Procedure: Cervical Esophagoscopy;  Surgeon: Osborn Coho, MD;  Location: [facility] OR;  Service: ENT;;   EYE SURGERY     LAPAROSCOPIC SMALL BOWEL RESECTION  2000   LARYNGOSCOPY AND ESOPHAGOSCOPY N/A 10/21/2021   Procedure: DIRECT LARYNGOSCOPY AND ESOPHAGOSCOPY WITH DILATION OF ESOPHAGEAL NARROWING;  Surgeon: Osborn Coho, MD;  Location: [facility] OR;  Service: ENT;  Laterality: N/A;   LARYNGOSCOPY AND ESOPHAGOSCOPY N/A 07/26/2022   Procedure: DIRECT LARYNGOSCOPY; ESOPHAGOSCOPY AND ESOPHAGEAL DILATION;  Surgeon: Osborn Coho, MD;  Location: [facility] OR;  Service: ENT;  Laterality: N/A;   MICROLARYNGOSCOPY WITH DILATION N/A 09/17/2019   Procedure: MICROLARYNGOSCOPY WITH DILATION;  Surgeon: Osborn Coho, MD;  Location: [facility] OR;  Service: ENT;  Laterality: N/A;   parathyroid surgery  2008?   left   SHOULDER SURGERY  02/1999   right   SKIN LESION EXCISION  08/1999   left side of face   TOTAL HIP ARTHROPLASTY Left 03/23/2023   Procedure: TOTAL HIP ARTHROPLASTY ANTERIOR APPROACH;  Surgeon: Kathryne Hitch, MD;  Location: [facility]S;  Service: Orthopedics;  Laterality: Left;    Family History  Problem Relation Age of Onset   Arthritis Father    Heart disease Father    Hypertension Father    Stroke Father    Heart attack Father    Arthritis Mother    Stroke Mother    Stroke Brother    Diabetes Brother    Hypertension Brother    Colon cancer Sister        in her 61's   Hyperlipidemia Sister    Hypertension Sister    Hypertension Sister    Diabetes Sister    Esophageal cancer Neg Hx    Rectal cancer Neg Hx    Stomach cancer Neg Hx     Social History:  reports that she has never smoked. She has never used smokeless tobacco. She reports that she does not drink alcohol and does not use drugs.    Allergies  Allergen Reactions    Diltiazem Hcl Other (See Comments)    Rapid heartbeat and elevated blood pressure   Inderal [Propranolol Hcl]     Pt does not remember reaction   Lopressor [Metoprolol Tartrate]     Per the pt' "I felt like I was going to pass out"   Penicillins Diarrhea    "That was 50 years ago"     Medications Prior to Admission  Medication Sig Dispense Refill   Calcium-Vitamin D-Vitamin K (VIACTIV CALCIUM PLUS D) 650-12.5-40 MG-MCG-MCG CHEW Chew 1 each by mouth daily.     flecainide (TAMBOCOR) 50 MG tablet TAKE 1 & 1/2 (ONE & ONE-HALF) TABLETS BY MOUTH TWICE DAILY (Patient taking differently: Take 75 mg by mouth 2 (two) times daily.) 270 tablet 0   Multiple Vitamin (MULTIVITAMIN WITH MINERALS) TABS tablet Take 1 tablet by mouth daily. gummies     Multiple Vitamins-Minerals (PRESERVISION  AREDS 2+MULTI VIT PO) Take 1 capsule by mouth in the morning and at bedtime.        Home: Home Living Family/patient expects to be discharged to:: Unsure Living Arrangements: Other relatives (granddaughter-works) Available Help at Discharge: Family Type of Home: House Home Access: Stairs to enter Secretary/administrator of Steps: 4-5 Entrance Stairs-Rails: Right Home Layout: One level Bathroom Shower/Tub: Health visitor: Standard Bathroom Accessibility: Yes Home Equipment: None  Lives With: Alone   Functional History: Prior Function Prior Level of Function : Independent/Modified Independent  Functional Status:  Mobility: Bed Mobility Overal bed mobility: Needs Assistance Bed Mobility: Supine to Sit Supine to sit: Min assist, HOB elevated Sit to supine: Mod assist, HOB elevated General bed mobility comments: oob in recliner Transfers Overall transfer level: Needs assistance Equipment used: Rolling walker (2 wheels) Transfers: Sit to/from Stand Sit to Stand: Min assist Bed to/from chair/wheelchair/BSC transfer type:: Step pivot Step pivot transfers: Min assist  Lateral/Scoot  Transfers: Min assist General transfer comment: Assist to rise, stabilize, control descent. VCs for safety, technique, hand placement. Increased time. Ambulation/Gait Ambulation/Gait assistance: Min assist, Min guard Gait Distance (Feet): 40 Feet Assistive device: Rolling walker (2 wheels) Gait Pattern/deviations: Step-to pattern General Gait Details: Cues for safety, technique, sequence, posture, step length, RW proximity. Assist to stabilize pt throughout distance. Followed closely with recliner. Seated rest break end of distance; on 2L O2 with SpO2 >93% throughout session    ADL: ADL Overall ADL's : Needs assistance/impaired Eating/Feeding: Independent Grooming: Set up, Sitting Upper Body Bathing: Set up, Sitting Lower Body Bathing: Moderate assistance, Sitting/lateral leans Upper Body Dressing : Set up, Sitting Lower Body Dressing: Maximal assistance, Sit to/from stand Toilet Transfer: Minimal assistance, BSC/3in1, Rolling walker (2 wheels) Toileting- Clothing Manipulation and Hygiene: Sit to/from stand, Moderate assistance Functional mobility during ADLs: Moderate assistance, Rolling walker (2 wheels)  Cognition: Cognition Overall Cognitive Status: Within Functional Limits for tasks assessed Orientation Level: Oriented X4 Cognition Arousal/Alertness: Awake/alert Behavior During Therapy: WFL for tasks assessed/performed Overall Cognitive Status: Within Functional Limits for tasks assessed General Comments: tends to defer to family   Blood pressure (!) 141/78, pulse 90, temperature 97.6 F (36.4 C), temperature source Oral, resp. rate 16, height 5\' 1"  (1.549 m), weight 45.4 kg, SpO2 100 %. Physical Exam Vitals and nursing note reviewed. Exam conducted with a chaperone present.  Constitutional:      Appearance: Normal appearance. She is normal weight.     Interventions: Nasal cannula in place.     Comments: Pt awake, alert, appropriate, granddaughter at bedside; pt on 2L O2  by [place], appears younger than stated age, NAD, sitting up in bedside chair  HENT:     Head: Normocephalic and atraumatic.     Right Ear: External ear normal.     Left Ear: External ear normal.     Nose: Nose normal. No congestion.     Mouth/Throat:     Mouth: Mucous membranes are dry.     Pharynx: Oropharynx is clear. No oropharyngeal exudate.  Eyes:     General:        Right eye: No discharge.        Left eye: No discharge.     Extraocular Movements: Extraocular movements intact.  Cardiovascular:     Rate and Rhythm: Normal rate. Rhythm irregular.     Heart sounds: Normal heart sounds. No murmur heard.    No gallop.  Pulmonary:     Comments: Decreased at bases. Intermittent cough with  dysphonia.  Coughed more when attempted nectar thick liquids.  Abdominal:     General: Abdomen is flat. Bowel sounds are normal. There is no distension.     Palpations: Abdomen is soft.     Tenderness: There is no abdominal tenderness.  Musculoskeletal:     Cervical back: Neck supple. No tenderness.     Comments: Left hip incision with surgical foam dressing. Min edema left hip and thigh. Linear ecchymotic area upper right thigh.   UE strength 5-/5 throughout B/L RLE 5-/5 and LLE 2/5 in HF, otherwise 5-/5  Skin:    General: Skin is warm and dry.  Neurological:     Mental Status: She is alert and oriented to person, place, and time.     Comments: Intact to light touch in all  4 extremities  Psychiatric:        Mood and Affect: Mood normal.        Behavior: Behavior normal.     Results for orders placed or performed during the hospital encounter of 03/21/23 (from the past 48 hour(s))  Glucose, capillary     Status: Abnormal   Collection Time: 03/26/23 11:59 AM  Result Value Ref Range   Glucose-Capillary 115 (H) 70 - 99 mg/dL    Comment: Glucose reference range applies only to samples taken after fasting for at least 8 hours.  CBC with Differential/Platelet     Status: Abnormal   Collection  Time: 03/27/23  3:36 AM  Result Value Ref Range   WBC 7.1 4.0 - 10.5 K/uL   RBC 2.89 (L) 3.87 - 5.11 MIL/uL   Hemoglobin 9.1 (L) 12.0 - 15.0 g/dL   HCT 57.8 (L) 46.9 - 62.9 %   MCV 97.2 80.0 - 100.0 fL   MCH 31.5 26.0 - 34.0 pg   MCHC 32.4 30.0 - 36.0 g/dL   RDW 52.8 41.3 - 24.4 %   Platelets 182 150 - 400 K/uL   nRBC 0.0 0.0 - 0.2 %   Neutrophils Relative % 63 %   Neutro Abs 4.5 1.7 - 7.7 K/uL   Lymphocytes Relative 22 %   Lymphs Abs 1.6 0.7 - 4.0 K/uL   Monocytes Relative 12 %   Monocytes Absolute 0.9 0.1 - 1.0 K/uL   Eosinophils Relative 2 %   Eosinophils Absolute 0.1 0.0 - 0.5 K/uL   Basophils Relative 0 %   Basophils Absolute 0.0 0.0 - 0.1 K/uL   Immature Granulocytes 1 %   Abs Immature Granulocytes 0.04 0.00 - 0.07 K/uL    Comment: Performed at [facility], [address], [place], [place] [address]  Basic metabolic panel     Status: Abnormal   Collection Time: 03/27/23  3:36 AM  Result Value Ref Range   Sodium 136 135 - 145 mmol/L   Potassium 3.7 3.5 - 5.1 mmol/L   Chloride 105 98 - 111 mmol/L   CO2 26 22 - 32 mmol/L   Glucose, Bld 109 (H) 70 - 99 mg/dL    Comment: Glucose reference range applies only to samples taken after fasting for at least 8 hours.   BUN 21 8 - 23 mg/dL   Creatinine, Ser 2.53 0.44 - 1.00 mg/dL   Calcium 7.6 (L) 8.9 - 10.3 mg/dL   GFR, Estimated >66 >44 mL/min    Comment: (NOTE) Calculated using the CKD-EPI Creatinine Equation (2021)    Anion gap 5 5 - 15    Comment: Performed at [facility], [address] [place], [place] [address]   No results found.    Blood pressure (!) 141/78, pulse 90, temperature 97.6 F (36.4 C), temperature source Oral, resp. rate 16, height 5\' 1"  (1.549 m), weight 45.4 kg, SpO2 100 %.  Medical Problem List and Plan: 1. Functional deficits secondary to L femoral neck fx s/p hemiarthroplasty  -patient may  shower with covering incision  -ELOS/Goals: 10-12 days mod I to  supervision 2.  Antithrombotics: -DVT/anticoagulation:  SCDs/TEDs/ASA   -antiplatelet therapy: ASA 81 mg po BID 3. Pain Management: Hydrocodone prn vs tylenol prn.  4. Mood/Behavior/Sleep: LCSW to follow for evaluation and support.   -antipsychotic agents: N/A 5. Neuropsych/cognition: This patient is capable of making decisions on her own behalf. 6. Skin/Wound Care: Routine pressure relief measures.  7. Fluids/Electrolytes/Nutrition:  Monitor I/O. Check CMET in am. 8. Left hip subcapital femoral neck  fx s/p anterior hemi: WBAT 9. Aspiration PNA: Continues to have frequent cough-->will check f/u CXR to rule out underlying overload.  --Continue Unasyn D# 3/5-7?  -con't 2L O2 by - wean as tolerated 10. Severe GERD: On Protonix IV BID-->will change to oral route 11. Chronic dysphagia/Esophageal stricture: Is able to modify diet  --last swallow study without evidence of aspiration and no change in stricture for past 4 years  --does not like Ensure--make her own shakes at home. Will try [employer] supplement. Pt CAN use nectar thick liquids if she wants, however can stay on thin liquids.   12. CAF: Monitor HR TID--continue Tambocor 13. ABLA: Recheck CBC in am. 14. Fluid overload: Monitor weight daily. Encourage low salt diet.         Jacquelynn Cree, PA-C 03/27/2023   I have personally performed a face to face diagnostic evaluation of this patient and formulated the key components of the plan.  Additionally, I have personally reviewed laboratory data, imaging studies, as well as relevant notes and concur with the physician assistant's documentation above.   The patient's status has not changed from the original H&P.  Any changes in documentation from the acute care chart have been noted above.

## 2023-03-27 NOTE — PMR Pre-admission (Signed)
PMR Admission Coordinator Pre-Admission Assessment  Patient: Michelle Vazquez is an 89 y.o., female MRN: [id number] DOB: 17-Oct-1929 Height: 5\' 1"  (154.9 cm) Weight: 45.4 kg  Insurance Information HMO:     PPO:      PCP:      IPA:      80/20:  no    OTHER:  PRIMARY: Medicare A and B Policy#: [id number] Subscriber: Pt. Phone#: Verified online    Fax#:  Pre-Cert#:       Employer:  Benefits:  Phone #:      Name:  Eff. Date: Parts A and B effective 10/06/1996  Deduct: $1632      Out of Pocket Max:  None      Life Max: N/A  CIR: 100%      SNF: 100 days Outpatient: 80%     Co-Pay: 20% Home Health58: 100%      Co-Pay: none DME: 80%     Co-Pay: 20% Providers: patient's choice  SECONDARY: AARP      Policy#: [id number]      Phone#:   Financial Counselor:       Phone#:   The "Data Collection Information Summary" for patients in Inpatient Rehabilitation Facilities with attached "Privacy Act Statement-Health Care Records" was provided and verbally reviewed with: Patient  Emergency Contact Information Contact Information     Name Relation Home Work Mobile   [place] Daughter [phone]   [facility] Relative [phone]         Current Medical History  Patient Admitting Diagnosis: Hip fx History of Present Illness: Pt. Is a 89 y.o. female with past medical history significant for osteoarthritis, chronic atrial fibrillation, paroxysmal SVT, history of bowel obstruction, bilateral cataracts, bilateral cataract surgery, hyperlipidemia, hypercontractile esophagus, mitral valve prolapse, osteoporosis, parathyroid adenoma, history of pneumonia as a child, face squamous cell CA, and urinary incontinence  who presented to [facility] ED on 03/1523 after mechanical fall on the left hip. She developed immediate pain and inability to bear weight on her  left hip. In the ED, chemistries and vitals were as follows:CBC 0 white count 11.8, hemoglobin 13.7 g deciliter  platelets 258. BMP with a glucose of 122 and BUN of 30 mg/dL with a calculated GFR of 55 mL/min. The rest of the BMP measurements were normal. Initial vital signs were temperature 97.7 F, pulse 98, respiration 18, BP 155/98 mmHg O2 sat 97% on room air.  The was started on LR at 125 mL an hour and received a tablet of Percocet 5/325 mg x 1. Portable 1 view chest radiograph with no focal airspace opacity on admission. Left hip x-ray showed a subcapital impaction fracture of the left femoral neck. Pt. Underwent Left direct anterior hip hemiarthroplasty  with orthopedic surgery on 03/23/23. Post op, Pt. Developed respiratory failure with hypoxia,. Chest x tray showed a new pleural effusion. There was also concern for aspiration and Pt.was started on IV Unasyn for PNA. Pt. Seen by PT/OT and they recommend CIR to assist return to PLOF.    Patient's medical record from [facility] has been reviewed by the rehabilitation admission coordinator and physician.  Past Medical History  Past Medical History:  Diagnosis Date   Arthritis    Atrial fibrillation (HCC)    Bowel obstruction (HCC) 2000   Cataract    bilateral removed   Complication of anesthesia    hard to wake after hysterectomy, states heart stopped during a Esophagoscopy, but states she has had several surgeries and  procedures with anesthesia and has never had that again   Dysrhythmia    HLD (hyperlipidemia)    Hypercontractile esophagus    Mitral valve prolapse    Osteoporosis    declines rx and declines DEXA f/u   Parathyroid adenoma    post parathyroid surgery in October of 2008   Paroxysmal supraventricular tachycardia    Pneumonia    as a child   Premature ventricular contractions (PVCs) (VPCs)    Schatzki's ring 08/2013   Dr. Rhea Belton, s/p dilation 08/2013, ?benefit   Squamous cell carcinoma in situ of skin    face   Urinary incontinence     Has the patient had major surgery during 100 days prior to admission?  No  Family History   family history includes Arthritis in her father and mother; Colon cancer in her sister; Diabetes in her brother and sister; Heart attack in her father; Heart disease in her father; Hyperlipidemia in her sister; Hypertension in her brother, father, sister, and sister; Stroke in her brother, father, and mother.  Current Medications  Current Facility-Administered Medications:    0.9 %  sodium chloride infusion, , Intravenous, Continuous, Marinda Elk, MD, Stopped at 03/24/23 1019   acetaminophen (TYLENOL) tablet 650 mg, 650 mg, Oral, Q6H PRN, 650 mg at 03/26/23 2325 **OR** acetaminophen (TYLENOL) suppository 650 mg, 650 mg, Rectal, Q6H PRN, Kathryne Hitch, MD   alum & mag hydroxide-simeth (MAALOX/MYLANTA) 200-200-20 MG/5ML suspension 30 mL, 30 mL, Oral, Q4H PRN, Kathryne Hitch, MD   ampicillin-sulbactam (UNASYN) 1.5 g in sodium chloride 0.9 % 100 mL IVPB, 1.5 g, Intravenous, Q8H, Poindexter, Leann T, RPH, Last Rate: 200 mL/hr at 03/27/23 0640, 1.5 g at 03/27/23 0640   aspirin chewable tablet 81 mg, 81 mg, Oral, BID, Kathryne Hitch, MD, 81 mg at 03/26/23 2258   diphenhydrAMINE (BENADRYL) 12.5 MG/5ML elixir 12.5-25 mg, 12.5-25 mg, Oral, Q4H PRN, Kathryne Hitch, MD   docusate (COLACE) 50 MG/5ML liquid 100 mg, 100 mg, Oral, BID, Marinda Elk, MD, 100 mg at 03/26/23 1610   feeding supplement (ENSURE ENLIVE / ENSURE PLUS) liquid 237 mL, 237 mL, Oral, BID BM, Kathryne Hitch, MD, 237 mL at 03/26/23 0901   flecainide (TAMBOCOR) tablet 75 mg, 75 mg, Oral, BID, Kathryne Hitch, MD, 75 mg at 03/26/23 2010   HYDROcodone-acetaminophen (NORCO) 7.5-325 MG per tablet 1-2 tablet, 1-2 tablet, Oral, Q4H PRN, Kathryne Hitch, MD   HYDROcodone-acetaminophen (NORCO/VICODIN) 5-325 MG per tablet 1-2 tablet, 1-2 tablet, Oral, Q4H PRN, Kathryne Hitch, MD   magnesium hydroxide (MILK OF MAGNESIA) suspension 30 mL, 30 mL,  Oral, Daily PRN, Kathryne Hitch, MD   menthol-cetylpyridinium (CEPACOL) lozenge 3 mg, 1 lozenge, Oral, PRN **OR** phenol (CHLORASEPTIC) mouth spray 1 spray, 1 spray, Mouth/Throat, PRN, Kathryne Hitch, MD, 1 spray at 03/25/23 2209   methocarbamol (ROBAXIN) tablet 500 mg, 500 mg, Oral, Q6H PRN **OR** methocarbamol (ROBAXIN) 500 mg in dextrose 5 % 50 mL IVPB, 500 mg, Intravenous, Q6H PRN, Kathryne Hitch, MD, Stopped at 03/23/23 1939   metoCLOPramide (REGLAN) tablet 5-10 mg, 5-10 mg, Oral, Q8H PRN **OR** metoCLOPramide (REGLAN) injection 5-10 mg, 5-10 mg, Intravenous, Q8H PRN, Kathryne Hitch, MD   morphine (PF) 2 MG/ML injection 0.5-1 mg, 0.5-1 mg, Intravenous, Q2H PRN, Kathryne Hitch, MD   Multivitamin Adult CHEW 1 Dose, 1 Dose, Oral, Daily, Marinda Elk, MD, 1 Dose at 03/26/23 1851   Oral care mouth rinse, 15 mL, Mouth  Rinse, 4 times per day, Kathryne Hitch, MD, 15 mL at 03/26/23 2205   Oral care mouth rinse, 15 mL, Mouth Rinse, PRN, Kathryne Hitch, MD   pantoprazole (PROTONIX) injection 40 mg, 40 mg, Intravenous, Q12H, Marinda Elk, MD, 40 mg at 03/26/23 2258   prochlorperazine (COMPAZINE) injection 5 mg, 5 mg, Intravenous, Q4H PRN, Kathryne Hitch, MD   protein supplement (UNJURY PLANTED PROTEIN) powder 2 oz, 2 oz, Oral, QID, Marinda Elk, MD, 2 oz at 03/26/23 0905  Patients Current Diet:  Diet Order             Diet - low sodium heart healthy           DIET DYS 3 Room service appropriate? Yes; Fluid consistency: Thin  Diet effective now                   Precautions / Restrictions Precautions Precautions: Fall Precaution Comments: monitor O2 Restrictions Weight Bearing Restrictions: No LLE Weight Bearing: Weight bearing as tolerated   Has the patient had 2 or more falls or a fall with injury in the past year? Yes  Prior Activity Level Community (5-7x/wk): Pt. active in the community  PTA  Prior Functional Level Self Care: Did the patient need help bathing, dressing, using the toilet or eating? Independent  Indoor Mobility: Did the patient need assistance with walking from room to room (with or without device)? Independent  Stairs: Did the patient need assistance with internal or external stairs (with or without device)? Independent  Functional Cognition: Did the patient need help planning regular tasks such as shopping or remembering to take medications? Independent  Patient Information Are you of Hispanic, Latino/a,or Spanish origin?: A. No, not of Hispanic, Latino/a, or Spanish origin What is your race?: A. White Do you need or want an interpreter to communicate with a doctor or health care staff?: 0. No  Patient's Response To:  Health Literacy and Transportation Is the patient able to respond to health literacy and transportation needs?: Yes Health Literacy - How often do you need to have someone help you when you read instructions, pamphlets, or other written material from your doctor or pharmacy?: Never In the past 12 months, has lack of transportation kept you from medical appointments or from getting medications?: No In the past 12 months, has lack of transportation kept you from meetings, work, or from getting things needed for daily living?: No  Home Assistive Devices / Equipment Home Assistive Devices/Equipment: Blood pressure cuff, Eyeglasses, Dentures (specify type) Home Equipment: None  Prior Device Use: Indicate devices/aids used by the patient prior to current illness, exacerbation or injury? None of the above  Current Functional Level Cognition  Overall Cognitive Status: Within Functional Limits for tasks assessed Orientation Level: Oriented X4 General Comments: tends to defer to family    Extremity Assessment (includes Sensation/Coordination)  Upper Extremity Assessment: Overall WFL for tasks assessed  Lower Extremity Assessment: Defer to PT  evaluation    ADLs  Overall ADL's : Needs assistance/impaired Eating/Feeding: Independent Grooming: Set up, Sitting Upper Body Bathing: Set up, Sitting Lower Body Bathing: Moderate assistance, Sitting/lateral leans Upper Body Dressing : Set up, Sitting Lower Body Dressing: Maximal assistance, Sit to/from stand Toilet Transfer: Minimal assistance, BSC/3in1, Rolling walker (2 wheels) Toileting- Clothing Manipulation and Hygiene: Sit to/from stand, Moderate assistance Functional mobility during ADLs: Moderate assistance, Rolling walker (2 wheels)    Mobility  Overal bed mobility: Needs Assistance Bed Mobility: Supine to Sit Supine  to sit: Min assist, HOB elevated Sit to supine: Mod assist, HOB elevated General bed mobility comments: oob in recliner    Transfers  Overall transfer level: Needs assistance Equipment used: Rolling walker (2 wheels) Transfers: Sit to/from Stand Sit to Stand: Min assist Bed to/from chair/wheelchair/BSC transfer type:: Step pivot Step pivot transfers: Min assist  Lateral/Scoot Transfers: Min assist General transfer comment: Assist to rise, stabilize, control descent. VCs for safety, technique, hand placement. Increased time.    Ambulation / Gait / Stairs / Wheelchair Mobility  Ambulation/Gait Ambulation/Gait assistance: Min assist, Land (Feet): 40 Feet Assistive device: Rolling walker (2 wheels) Gait Pattern/deviations: Step-to pattern General Gait Details: Cues for safety, technique, sequence, posture, step length, RW proximity. Assist to stabilize pt throughout distance. Followed closely with recliner. Seated rest break end of distance; on 2L O2 with SpO2 >93% throughout session    Posture / Balance Balance Overall balance assessment: Needs assistance Sitting-balance support: No upper extremity supported, Feet supported Sitting balance-Leahy Scale: Fair Standing balance support: During functional activity, Reliant on assistive  device for balance Standing balance-Leahy Scale: Poor    Special needs/care consideration Skin surgical incision   Previous Home Environment (from acute therapy documentation) Living Arrangements: Other relatives (granddaughter-works)  Lives With: Alone Available Help at Discharge: Family Type of Home: House Home Layout: One level Home Access: Stairs to enter Entrance Stairs-Rails: Right Entrance Stairs-Number of Steps: 4-5 Bathroom Shower/Tub: Health visitor: Standard Bathroom Accessibility: Yes How Accessible: Accessible via walker Home Care Services: No  Discharge Living Setting Plans for Discharge Living Setting: Patient's home Type of Home at Discharge: House Discharge Home Layout: One level Discharge Home Access: Stairs to enter Entrance Stairs-Rails: Right Entrance Stairs-Number of Steps: 4-5 Discharge Bathroom Shower/Tub: Walk-in shower Discharge Bathroom Toilet: Standard Discharge Bathroom Accessibility: Yes How Accessible: Accessible via walker, Accessible via wheelchair Does the patient have any problems obtaining your medications?: No  Social/Family/Support Systems Patient Roles: Other (Comment) Contact Information: [phone] Anticipated Caregiver: Theresa Mulligan Anticipated Caregiver's Contact Information: [phone] Ability/Limitations of Caregiver: Corrie Dandy, daughter states that she, her husband and their daughter are going to rotate to provide 24/7 care Caregiver Availability: 24/7 Discharge Plan Discussed with Primary Caregiver: Yes Is Caregiver In Agreement with Plan?: Yes Does Caregiver/Family have Issues with Lodging/Transportation while Pt is in Rehab?: No  Goals Patient/Family Goal for Rehab: PT/OT Supervision to mod I Expected length of stay: 10-12 days Pt/Family Agrees to Admission and willing to participate: Yes Program Orientation Provided & Reviewed with Pt/Caregiver Including Roles  & Responsibilities: Yes  Decrease burden of  Care through IP rehab admission: not anticipated   Possible need for SNF placement upon discharge: not anticipated  Patient Condition: I have reviewed medical records from [facility], spoken with CM, and patient and daughter. I discussed via phone for inpatient rehabilitation assessment.  Patient will benefit from ongoing PT and OT, can actively participate in 3 hours of therapy a day 5 days of the week, and can make measurable gains during the admission.  Patient will also benefit from the coordinated team approach during an Inpatient Acute Rehabilitation admission.  The patient will receive intensive therapy as well as Rehabilitation physician, nursing, social worker, and care management interventions.  Due to safety, skin/wound care, disease management, medication administration, pain management, and patient education the patient requires 24 hour a day rehabilitation nursing.  The patient is currently min A with mobility and basic ADLs.  Discharge setting and therapy post discharge at home  with home health is anticipated.  Patient has agreed to participate in the Acute Inpatient Rehabilitation Program and will admit today.  Preadmission Screen Completed By:  Jeronimo Greaves, 03/27/2023 8:13 AM ______________________________________________________________________   Discussed status with Dr. Berline Chough  on 03/27/23 at 1027 and received approval for admission today.  Admission Coordinator:  Jeronimo Greaves, CCC-SLP, time 1027/Date 03/27/23   Assessment/Plan: Diagnosis: Does the need for close, 24 hr/day Medical supervision in concert with the patient's rehab needs make it unreasonable for this patient to be served in a less intensive setting? Yes Co-Morbidities requiring supervision/potential complications: L hip fx, PNA- Afib, pSVT, esophageal dysphagia, urinary incontinence Due to bladder management, bowel management, safety, skin/wound care, disease management, medication administration, pain  management, and patient education, does the patient require 24 hr/day rehab nursing? Yes Does the patient require coordinated care of a physician, rehab nurse, PT, OT, and SLP to address physical and functional deficits in the context of the above medical diagnosis(es)? Yes Addressing deficits in the following areas: balance, endurance, locomotion, strength, transferring, bowel/bladder control, bathing, dressing, feeding, grooming, and toileting Can the patient actively participate in an intensive therapy program of at least 3 hrs of therapy 5 days a week? Yes The potential for patient to make measurable gains while on inpatient rehab is good Anticipated functional outcomes upon discharge from inpatient rehab: modified independent and supervision PT, modified independent and supervision OT, n/a SLP Estimated rehab length of stay to reach the above functional goals is: 10-12 days Anticipated discharge destination: Home 10. Overall Rehab/Functional Prognosis: good   MD Signature:

## 2023-03-27 NOTE — Discharge Summary (Signed)
Physician Discharge Summary  Michelle Vazquez WGN:[id number] DOB: [birth year] DOA: 03/21/2023  PCP: Pincus Sanes, MD  Admit date: 03/21/2023 Discharge date: 03/27/2023  Admitted From: Home Disposition: Inpatient rehab   Recommendations for Outpatient Follow-up:  Follow up with PCP in 1-2 weeks Please obtain BMP/CBC in one week   Home Health:No Equipment/Devices:None  Discharge Condition:Stable CODE STATUS:Full Diet recommendation: Heart Healthy   Brief/Interim Summary: 89 y.o. female past medical history significant for osteoarthritis, chronic atrial fibrillation, with paroxysmal SVT not on anticoagulation, osteoporosis and history of squamous cell cancer comes in after mechanical fall on the left hip developing immediate pain and inability to bear weight left hip x-ray shows subcapital left femur fracture   Discharge Diagnoses:  Principal Problem:   Closed left hip fracture, initial encounter (HCC) Active Problems:   Unifocal PVCs   HLD (hyperlipidemia)   Hyperglycemia   Hypertension   CKD (chronic kidney disease) stage 3, GFR 30-59 ml/min (HCC)   Esophageal stenosis   Prolonged QT interval   Paroxysmal SVT (supraventricular tachycardia)   Paroxysmal atrial fibrillation (HCC)   Grade II diastolic dysfunction  Left hip fracture: Orthopedic surgery was consulted she status post total left hip arthroplasty on 03/23/2023. Orthopedic surgery recommended aspirin for DVT prophylaxis and narcotics for pain. Physical therapy evaluated the patient and recommended inpatient rehab.  Acute respiratory failure with hypoxia likely due to aspiration pneumonia: Patient has a history of esophageal dysmotility with severe esophageal stricture, she remained afebrile who was complaining of worsening cough chest x-ray showed new infiltrates with effusions with started on IV Unasyn. After 24 hours on IV antibiotic she relates her cough is significantly improved. Blood cultures show 1 out of 2  staph epi or minimus which is likely a contaminant. She remained afebrile tolerating her diet and she Improving. She remains on 1 L of oxygen, will try to wean to room air out of bed to chair continue flutter valve and incentive spirometry.  Prolonged QTc/PVCs/paroxysmal atrial fibrillation: With a chads Vascor six 2D echo showed an EF of 55% mitral valve is myxomatous no stenosis. Potassium was kept greater than 4 magnesium greater than 2.  Chronic heart failure grade 2 diastolic: Appears euvolemic no change made to her medication.  Hyperlipidemia: Noted.  Hyperglycemia: Reactive she required no insulin.  Elevated blood pressure without a diagnosis of hypertension: Currently on no antihypertensive medications at home, will continue to monitor blood pressure remain less than 160/90.  Chronic kidney disease stage IIIa: His creatinine remained at baseline.  Esophageal motility/severe esophageal stricture: Was able to take a diet resume dysphagia 3 diet with Ensure. She is [facility] at [facility] for this.    Discharge Instructions  Discharge Instructions     Diet - low sodium heart healthy   Complete by: As directed    Increase activity slowly   Complete by: As directed       Allergies as of 03/27/2023       Reactions   Diltiazem Hcl Other (See Comments)   Rapid heartbeat and elevated blood pressure   Inderal [propranolol Hcl]    Pt does not remember reaction   Lopressor [metoprolol Tartrate]    Per the pt' "I felt like I was going to pass out"   Penicillins Diarrhea   "That was 50 years ago"         Medication List     TAKE these medications    ampicillin-sulbactam 1.5 g in sodium chloride 0.9 % 100 mL Inject 1.5 g  into the vein every 8 (eight) hours for 3 days.   aspirin 81 MG chewable tablet Chew 1 tablet (81 mg total) by mouth 2 (two) times daily.   flecainide 50 MG tablet Commonly known as: TAMBOCOR TAKE 1 & 1/2 (ONE & ONE-HALF) TABLETS BY MOUTH  TWICE DAILY What changed: See the new instructions.   HYDROcodone-acetaminophen 5-325 MG tablet Commonly known as: NORCO/VICODIN Take 1-2 tablets by mouth every 6 (six) hours as needed for moderate pain (pain score 4-6).   multivitamin with minerals Tabs tablet Take 1 tablet by mouth daily. gummies   PRESERVISION AREDS 2+MULTI VIT PO Take 1 capsule by mouth in the morning and at bedtime.   Viactiv Calcium Plus D 650-12.5-40 MG-MCG-MCG Chew Generic drug: Calcium-Vitamin D-Vitamin K Chew 1 each by mouth daily.               Durable Medical Equipment  (From admission, onward)           Start     Ordered   03/23/23 2024  DME 3 n 1  Once        03/23/23 2023   03/23/23 2024  DME Walker rolling  Once       Question Answer Comment  Walker: With 5 Inch Wheels   Patient needs a walker to treat with the following condition Status post total replacement of left hip      03/23/23 2023            Follow-up Information     Kathryne Hitch, MD. Schedule an appointment as soon as possible for a visit in 2 week(s).   Specialty: Orthopedic Surgery Contact information: [address] [place] [address] [phone]                Allergies  Allergen Reactions   Diltiazem Hcl Other (See Comments)    Rapid heartbeat and elevated blood pressure   Inderal [Propranolol Hcl]     Pt does not remember reaction   Lopressor [Metoprolol Tartrate]     Per the pt' "I felt like I was going to pass out"   Penicillins Diarrhea    "That was 50 years ago"     Consultations: Orthopedic surgery   Procedures/Studies: DG CHEST PORT 1 VIEW  Result Date: 03/25/2023 CLINICAL DATA:  [id number] SOB (shortness of breath) [id number] EXAM: PORTABLE CHEST - 1 VIEW COMPARISON:  03/21/2023 FINDINGS: New left pleural effusion, with interstitial and airspace opacities in the left lower lung. Right lung clear. Heart size upper limits normal. No pneumothorax. Visualized bones  unremarkable. IMPRESSION: New left pleural effusion with left lower lung airspace disease. Electronically Signed   By: Corlis Leak M.D.   On: 03/25/2023 11:19   DG Pelvis Portable  Result Date: 03/23/2023 CLINICAL DATA:  Left hip fracture status post hemiarthroplasty EXAM: PORTABLE PELVIS 1-2 VIEWS COMPARISON:  03/23/2023, 03/21/2023 FINDINGS: Frontal view of the lower pelvis and bilateral hips was obtained. Left hip hemiarthroplasty is identified in the expected position without signs of acute complication. Right hip is stable. Postsurgical changes in the soft tissues overlying the left hip. IMPRESSION: 1. Unremarkable left hip hemiarthroplasty. Electronically Signed   By: Sharlet Salina M.D.   On: 03/23/2023 18:32   DG HIP UNILAT WITH PELVIS 1V LEFT  Result Date: 03/23/2023 CLINICAL DATA:  Intraoperative fluoroscopy for left hip arthroplasty. EXAM: DG HIP (WITH OR WITHOUT PELVIS) 1V*L* COMPARISON:  Pelvis and left hip radiographs 03/21/2023 FINDINGS: Images were performed intraoperatively without the presence of  a radiologist. Interval placement of bipolar left hip hemiarthroplasty. No hardware complication is seen. Total fluoroscopy images: 3 Total fluoroscopy time: 12 seconds Total dose: Radiation Exposure Index (as provided by the fluoroscopic device): 0.923 mGy air Kerma Please see intraoperative findings for further detail. IMPRESSION: Intraoperative fluoroscopy for left hip hemiarthroplasty. Electronically Signed   By: Neita Garnet M.D.   On: 03/23/2023 18:20   DG C-Arm 1-60 Min-No Report  Result Date: 03/23/2023 Fluoroscopy was utilized by the requesting physician.  No radiographic interpretation.   ECHOCARDIOGRAM COMPLETE  Result Date: 03/22/2023    ECHOCARDIOGRAM REPORT   Patient Name:   SHECCID REANO Date of Exam: 03/22/2023 Medical Rec #:  [id number]     Height:       61.0 in Accession #:    [id number]    Weight:       100.0 lb Date of Birth:  1929/03/01    BSA:          1.407 m Patient  Age:    89 years      BP:           121/73 mmHg Patient Gender: F             HR:           81 bpm. Exam Location:  Inpatient Procedure: 2D Echo, Cardiac Doppler and Color Doppler Indications:    I34.8 Other nonrheumatic mitral valve disorders; I48.91*                 Unspeicified atrial fibrillation; preoperative evaluation  History:        Patient has prior history of Echocardiogram examinations. Mitral                 Valve Prolapse, Arrythmias:PVC; Risk Factors:Hypertension.  Sonographer:    Mike Gip Referring Phys: [id number] DAVID MANUEL ORTIZ IMPRESSIONS  1. Left ventricular ejection fraction, by estimation, is 55 to 60%. The left ventricle has normal function. The left ventricle has no regional wall motion abnormalities. Left ventricular diastolic parameters are consistent with Grade II diastolic dysfunction (pseudonormalization). Elevated left ventricular end-diastolic pressure.  2. Right ventricular systolic function is normal. The right ventricular size is mildly enlarged. There is normal pulmonary artery systolic pressure. The estimated right ventricular systolic pressure is 33.7 mmHg.  3. Left atrial size was severely dilated.  4. The mitral valve is myxomatous. Mild mitral valve regurgitation. No evidence of mitral stenosis. There is mild holosystolic prolapse of the middle scallop of the posterior leaflet of the mitral valve.  5. The aortic valve is normal in structure. Aortic valve regurgitation is mild. No aortic stenosis is present. Aortic regurgitation PHT measures 469 msec.  6. The inferior vena cava is normal in size with greater than 50% respiratory variability, suggesting right atrial pressure of 3 mmHg. FINDINGS  Left Ventricle: Left ventricular ejection fraction, by estimation, is 55 to 60%. The left ventricle has normal function. The left ventricle has no regional wall motion abnormalities. The left ventricular internal cavity size was normal in size. There is  no left ventricular  hypertrophy. Left ventricular diastolic parameters are consistent with Grade II diastolic dysfunction (pseudonormalization). Elevated left ventricular end-diastolic pressure. Right Ventricle: The right ventricular size is mildly enlarged. No increase in right ventricular wall thickness. Right ventricular systolic function is normal. There is normal pulmonary artery systolic pressure. The tricuspid regurgitant velocity is 2.77  m/s, and with an assumed right atrial pressure of 3 mmHg, the estimated right ventricular systolic  pressure is 33.7 mmHg. Left Atrium: Left atrial size was severely dilated. Right Atrium: Right atrial size was normal in size. Pericardium: There is no evidence of pericardial effusion. Mitral Valve: The mitral valve is myxomatous. There is mild holosystolic prolapse of the middle scallop of the posterior leaflet of the mitral valve. Mild mitral valve regurgitation. No evidence of mitral valve stenosis. Tricuspid Valve: The tricuspid valve is normal in structure. Tricuspid valve regurgitation is trivial. No evidence of tricuspid stenosis. Aortic Valve: The aortic valve is normal in structure. Aortic valve regurgitation is mild. Aortic regurgitation PHT measures 469 msec. No aortic stenosis is present. Pulmonic Valve: The pulmonic valve was normal in structure. Pulmonic valve regurgitation is not visualized. No evidence of pulmonic stenosis. Aorta: The aortic root is normal in size and structure. Venous: The inferior vena cava is normal in size with greater than 50% respiratory variability, suggesting right atrial pressure of 3 mmHg. IAS/Shunts: No atrial level shunt detected by color flow Doppler.  LEFT VENTRICLE PLAX 2D LVIDd:         3.10 cm     Diastology LVIDs:         2.20 cm     LV e' medial:    5.98 cm/s LV PW:         1.10 cm     LV E/e' medial:  16.6 LV IVS:        1.00 cm     LV e' lateral:   7.94 cm/s LVOT diam:     1.80 cm     LV E/e' lateral: 12.5 LV SV:         35 LV SV Index:   25  LVOT Area:     2.54 cm  LV Volumes (MOD) LV vol d, MOD A2C: 66.0 ml LV vol d, MOD A4C: 61.3 ml LV vol s, MOD A2C: 26.0 ml LV vol s, MOD A4C: 23.5 ml LV SV MOD A2C:     40.0 ml LV SV MOD A4C:     61.3 ml LV SV MOD BP:      38.9 ml RIGHT VENTRICLE             IVC RV Basal diam:  4.10 cm     IVC diam: 1.40 cm RV S prime:     10.90 cm/s TAPSE (M-mode): 2.2 cm LEFT ATRIUM             Index        RIGHT ATRIUM           Index LA diam:        2.40 cm 1.71 cm/m   RA Area:     14.50 cm LA Vol (A2C):   66.1 ml 46.99 ml/m  RA Volume:   35.50 ml  25.23 ml/m LA Vol (A4C):   62.0 ml 44.07 ml/m LA Biplane Vol: 65.4 ml 46.49 ml/m  AORTIC VALVE LVOT Vmax:   70.50 cm/s LVOT Vmean:  48.900 cm/s LVOT VTI:    0.139 m AI PHT:      469 msec  AORTA Ao Root diam: 2.90 cm Ao Asc diam:  2.50 cm MITRAL VALVE               TRICUSPID VALVE MV Area (PHT): 5.13 cm    TR Peak grad:   30.7 mmHg MV Decel Time: 148 msec    TR Vmax:        277.00 cm/s MV E velocity: 99.40 cm/s MV A velocity: 99.80  cm/s  SHUNTS MV E/A ratio:  1.00        Systemic VTI:  0.14 m                            Systemic Diam: 1.80 cm Armanda Magic MD Electronically signed by Armanda Magic MD Signature Date/Time: 03/22/2023/9:28:28 AM    Final    DG Hip Unilat W or Wo Pelvis 2-3 Views Left  Result Date: 03/21/2023 CLINICAL DATA:  Fall.  Left hip pain. EXAM: DG HIP (WITH OR WITHOUT PELVIS) 2-3V LEFT COMPARISON:  None Available. FINDINGS: There is subcapital impaction fracture of the left femoral neck with mild superior displacement of the distal femur. IMPRESSION: Subcapital impaction fracture of the left femoral neck. Electronically Signed   By: Larose Hires D.O.   On: 03/21/2023 15:09   DG Chest 1 View  Result Date: 03/21/2023 CLINICAL DATA:  Preop EXAM: CHEST  1 VIEW COMPARISON:  CXR 09/13/22 FINDINGS: No pleural effusion. No pneumothorax. Prominent cardiac contours. No focal airspace opacity. No radiographically displaced rib fractures. Visualized upper abdomen is  unremarkable. IMPRESSION: No focal airspace opacity Electronically Signed   By: Lorenza Cambridge M.D.   On: 03/21/2023 15:09   DG ESOPHAGUS W DOUBLE CM (HD)  Result Date: 02/27/2023 CLINICAL DATA:  Provided history: Dysphagia, unspecified type. Status post esophageal dilation. Additional history provided: History of multiple prior esophageal dilations (most recently in September of 2023). Difficulty swallowing foods and liquids. Inability to swallow pills. EXAM: ESOPHAGUS/BARIUM SWALLOW/TABLET STUDY TECHNIQUE: A single contrast esophagram was performed using thin liquid barium. The exam was performed by Alex Gardener, NP, and was supervised and interpreted by Dr. Jackey Loge. FLUOROSCOPY: Fluoroscopy time: 3 minutes, 18 seconds (6.7 mGy). COMPARISON:  Chest CT 10/17/2022. Prior esophagrams 03/17/2021 and earlier. FINDINGS: Persistent/recurrent high-grade stricture of the cervical esophagus, similar in appearance to the prior esophagram of 03/17/2021 (best appreciated on series 6 and series 12). Associated piriform sinus dilation (left greater than right), also similar to this prior exam. Due to contrast delay at site of the high-grade stricture within the cervical esophagus, there was poor distension of the esophagus more distally, limiting evaluation at these levels. Within this limitation, no additional stricture was identified. Large-volume spontaneous gastroesophageal reflux was observed. Small hiatal hernia. The patient reported an inability to swallow pills and a 13 mm barium tablet was not administered. Impression #1 will be called to the ordering clinician or representative by the Radiologist Assistant, and communication documented in the PACS or [employer]. IMPRESSION: 1. Persistent/recurrent high-grade stricture of the cervical esophagus with associated piriformis sinus dilation, similar to the prior esophagram of 03/17/2021. 2. Limited evaluation of the esophagus more distally, as described. 3. Large  volume spontaneous gastroesophageal reflux. 4. Small hiatal hernia. Electronically Signed   By: Jackey Loge D.O.   On: 02/27/2023 12:57   (Echo, Carotid, EGD, Colonoscopy, ERCP)    Subjective: No complaints  Discharge Exam: Vitals:   03/26/23 2203 03/27/23 0546  BP: (!) 142/77 (!) 141/78  Pulse: 90 90  Resp: 16 16  Temp: 98 F (36.7 C) 97.6 F (36.4 C)  SpO2: 98% 100%   Vitals:   03/26/23 0604 03/26/23 1334 03/26/23 2203 03/27/23 0546  BP: (!) 140/74 (!) 123/53 (!) 142/77 (!) 141/78  Pulse: 89 95 90 90  Resp: 16 18 16 16   Temp: 98.3 F (36.8 C) 98.3 F (36.8 C) 98 F (36.7 C) 97.6 F (36.4 C)  TempSrc:  Oral  Oral Oral  SpO2: 96% 94% 98% 100%  Weight:      Height:        General: Pt is alert, awake, not in acute distress Cardiovascular: RRR, S1/S2 +, no rubs, no gallops Respiratory: CTA bilaterally, no wheezing, no rhonchi Abdominal: Soft, NT, ND, bowel sounds + Extremities: no edema, no cyanosis    The results of significant diagnostics from this hospitalization (including imaging, microbiology, ancillary and laboratory) are listed below for reference.     Microbiology: Recent Results (from the past 240 hour(s))  Surgical PCR screen     Status: None   Collection Time: 03/22/23 12:16 AM   Specimen: Nasal Mucosa; Nasal Swab  Result Value Ref Range Status   MRSA, PCR NEGATIVE NEGATIVE Final   Staphylococcus aureus NEGATIVE NEGATIVE Final    Comment: (NOTE) The Xpert SA Assay (FDA approved for NASAL specimens in patients 84 years of age and older), is one component of a comprehensive surveillance program. It is not intended to diagnose infection nor to guide or monitor treatment. Performed at [facility], [address] [place], [place] [address]   Culture, blood (Routine X 2) w Reflex to ID Panel     Status: None (Preliminary result)   Collection Time: 03/25/23  9:41 AM   Specimen: BLOOD  Result Value Ref Range Status   Specimen  Description   Final    BLOOD BLOOD RIGHT ARM AEROBIC BOTTLE ONLY Performed at [facility], [address], [place], [place] [address]    Special Requests   Final    BOTTLES DRAWN AEROBIC ONLY Blood Culture adequate volume Performed at [facility], [address], [place], [place] [address]    Culture  Setup Time   Final    GRAM POSITIVE COCCI AEROBIC BOTTLE ONLY CRITICAL RESULT CALLED TO, READ BACK BY AND VERIFIED WITH: PHARMD M.JAMES AT 1503 ON 05/202/2024 BY T.SAAD. Performed at [facility], [address], [place], [place] [address]    Culture GRAM POSITIVE COCCI  Final   Report Status PENDING  Incomplete  Culture, blood (Routine X 2) w Reflex to ID Panel     Status: None (Preliminary result)   Collection Time: 03/25/23  9:41 AM   Specimen: BLOOD  Result Value Ref Range Status   Specimen Description   Final    BLOOD BLOOD RIGHT HAND AEROBIC BOTTLE ONLY Performed at [facility], [address], [place], [place] [address]    Special Requests   Final    BOTTLES DRAWN AEROBIC ONLY Blood Culture adequate volume Performed at [facility], [address], [place], [place] [address]    Culture   Final    NO GROWTH 2 DAYS Performed at [facility], [address] [place], [place] [address]    Report Status PENDING  Incomplete  Blood Culture ID Panel (Reflexed)     Status: Abnormal   Collection Time: 03/25/23  9:41 AM  Result Value Ref Range Status   Enterococcus faecalis NOT DETECTED NOT DETECTED Final   Enterococcus Faecium NOT DETECTED NOT DETECTED Final   Listeria monocytogenes NOT DETECTED NOT DETECTED Final   Staphylococcus species DETECTED (A) NOT DETECTED Final    Comment: CRITICAL RESULT CALLED TO, READ BACK BY AND VERIFIED WITH: PHARMD M.JAMES AT 1503 ON 05/202/2024 BY T.SAAD.    Staphylococcus aureus (BCID) NOT DETECTED NOT DETECTED Final   Staphylococcus epidermidis DETECTED  (A) NOT DETECTED  Final    Comment: Methicillin (oxacillin) resistant coagulase negative staphylococcus. Possible blood culture contaminant (unless isolated from more than one blood culture draw or clinical case suggests pathogenicity). No antibiotic treatment is indicated for blood  culture contaminants. CRITICAL RESULT CALLED TO, READ BACK BY AND VERIFIED WITH: PHARMD M.JAMES AT 1503 ON 05/202/2024 BY T.SAAD.    Staphylococcus lugdunensis NOT DETECTED NOT DETECTED Final   Streptococcus species NOT DETECTED NOT DETECTED Final   Streptococcus agalactiae NOT DETECTED NOT DETECTED Final   Streptococcus pneumoniae NOT DETECTED NOT DETECTED Final   Streptococcus pyogenes NOT DETECTED NOT DETECTED Final   A.calcoaceticus-baumannii NOT DETECTED NOT DETECTED Final   Bacteroides fragilis NOT DETECTED NOT DETECTED Final   Enterobacterales NOT DETECTED NOT DETECTED Final   Enterobacter cloacae complex NOT DETECTED NOT DETECTED Final   Escherichia coli NOT DETECTED NOT DETECTED Final   Klebsiella aerogenes NOT DETECTED NOT DETECTED Final   Klebsiella oxytoca NOT DETECTED NOT DETECTED Final   Klebsiella pneumoniae NOT DETECTED NOT DETECTED Final   Proteus species NOT DETECTED NOT DETECTED Final   Salmonella species NOT DETECTED NOT DETECTED Final   Serratia marcescens NOT DETECTED NOT DETECTED Final   Haemophilus influenzae NOT DETECTED NOT DETECTED Final   Neisseria meningitidis NOT DETECTED NOT DETECTED Final   Pseudomonas aeruginosa NOT DETECTED NOT DETECTED Final   Stenotrophomonas maltophilia NOT DETECTED NOT DETECTED Final   Candida albicans NOT DETECTED NOT DETECTED Final   Candida auris NOT DETECTED NOT DETECTED Final   Candida glabrata NOT DETECTED NOT DETECTED Final   Candida krusei NOT DETECTED NOT DETECTED Final   Candida parapsilosis NOT DETECTED NOT DETECTED Final   Candida tropicalis NOT DETECTED NOT DETECTED Final   Cryptococcus neoformans/gattii NOT DETECTED NOT DETECTED Final    Methicillin resistance mecA/C DETECTED (A) NOT DETECTED Final    Comment: CRITICAL RESULT CALLED TO, READ BACK BY AND VERIFIED WITH: PHARMD M.JAMES AT 1503 ON 05/202/2024 BY T.SAAD. Performed at [facility], [address] [place], [place] [address]      Labs: BNP (last 3 results) No results for input(s): "BNP" in the last 8760 hours. Basic Metabolic Panel: Recent Labs  Lab 03/21/23 1343 03/22/23 0324 03/23/23 0821 03/24/23 0312 03/27/23 0336  NA 137 129* 136 135 136  K 4.1 3.7 4.2 4.2 3.7  CL 99 97* 105 102 105  CO2 27 25 25 24 26   GLUCOSE 122* 148* 118* 142* 109*  BUN 30* 23 18 25* 21  CREATININE 0.97 0.87 0.74 0.90 0.75  CALCIUM 9.4 8.1* 8.0* 7.8* 7.6*  MG 2.0  --   --   --   --    Liver Function Tests: Recent Labs  Lab 03/22/23 0324  AST 19  ALT 17  ALKPHOS 42  BILITOT 1.2  PROT 6.4*  ALBUMIN 3.4*   No results for input(s): "LIPASE", "AMYLASE" in the last 168 hours. No results for input(s): "AMMONIA" in the last 168 hours. CBC: Recent Labs  Lab 03/21/23 1343 03/22/23 0324 03/24/23 0312 03/25/23 0257 03/27/23 0336  WBC 11.8* 10.1 7.6 7.7 7.1  NEUTROABS 9.3*  --   --   --  4.5  HGB 13.7 12.9 11.3* 9.8* 9.1*  HCT 41.5 38.3 36.0 30.1* 28.1*  MCV 94.5 94.3 97.6 96.8 97.2  PLT 258 211 156 152 182   Cardiac Enzymes: No results for input(s): "CKTOTAL", "CKMB", "CKMBINDEX", "TROPONINI" in the last 168 hours. BNP: Invalid input(s): "POCBNP" CBG: Recent Labs  Lab 03/26/23 1159  GLUCAP 115*  D-Dimer No results for input(s): "DDIMER" in the last 72 hours. Hgb A1c No results for input(s): "HGBA1C" in the last 72 hours. Lipid Profile No results for input(s): "CHOL", "HDL", "LDLCALC", "TRIG", "CHOLHDL", "LDLDIRECT" in the last 72 hours. Thyroid function studies No results for input(s): "TSH", "T4TOTAL", "T3FREE", "THYROIDAB" in the last 72 hours.  Invalid input(s): "FREET3" Anemia work up No results for input(s): "VITAMINB12", "FOLATE",  "FERRITIN", "TIBC", "IRON", "RETICCTPCT" in the last 72 hours. Urinalysis No results found for: "COLORURINE", "APPEARANCEUR", "LABSPEC", "PHURINE", "GLUCOSEU", "HGBUR", "BILIRUBINUR", "KETONESUR", "PROTEINUR", "UROBILINOGEN", "NITRITE", "LEUKOCYTESUR" Sepsis Labs Recent Labs  Lab 03/22/23 0324 03/24/23 0312 03/25/23 0257 03/27/23 0336  WBC 10.1 7.6 7.7 7.1   Microbiology Recent Results (from the past 240 hour(s))  Surgical PCR screen     Status: None   Collection Time: 03/22/23 12:16 AM   Specimen: Nasal Mucosa; Nasal Swab  Result Value Ref Range Status   MRSA, PCR NEGATIVE NEGATIVE Final   Staphylococcus aureus NEGATIVE NEGATIVE Final    Comment: (NOTE) The Xpert SA Assay (FDA approved for NASAL specimens in patients 40 years of age and older), is one component of a comprehensive surveillance program. It is not intended to diagnose infection nor to guide or monitor treatment. Performed at [facility], [address], [place], [place] [address]   Culture, blood (Routine X 2) w Reflex to ID Panel     Status: None (Preliminary result)   Collection Time: 03/25/23  9:41 AM   Specimen: BLOOD  Result Value Ref Range Status   Specimen Description   Final    BLOOD BLOOD RIGHT ARM AEROBIC BOTTLE ONLY Performed at [facility], [address] [place], [place] [address]    Special Requests   Final    BOTTLES DRAWN AEROBIC ONLY Blood Culture adequate volume Performed at [facility], [address] [place], [place] [address]    Culture  Setup Time   Final    GRAM POSITIVE COCCI AEROBIC BOTTLE ONLY CRITICAL RESULT CALLED TO, READ BACK BY AND VERIFIED WITH: PHARMD M.JAMES AT 1503 ON 05/202/2024 BY T.SAAD. Performed at [facility], [address], [place], [place] [address]    Culture GRAM POSITIVE COCCI  Final   Report Status PENDING  Incomplete  Culture, blood (Routine X 2) w Reflex to ID Panel     Status: None  (Preliminary result)   Collection Time: 03/25/23  9:41 AM   Specimen: BLOOD  Result Value Ref Range Status   Specimen Description   Final    BLOOD BLOOD RIGHT HAND AEROBIC BOTTLE ONLY Performed at [facility], [address] [place], [place] [address]    Special Requests   Final    BOTTLES DRAWN AEROBIC ONLY Blood Culture adequate volume Performed at [facility], [address] [place], [place] [address]    Culture   Final    NO GROWTH 2 DAYS Performed at [facility], [address] [place], [place] [address]    Report Status PENDING  Incomplete  Blood Culture ID Panel (Reflexed)     Status: Abnormal   Collection Time: 03/25/23  9:41 AM  Result Value Ref Range Status   Enterococcus faecalis NOT DETECTED NOT DETECTED Final   Enterococcus Faecium NOT DETECTED NOT DETECTED Final   Listeria monocytogenes NOT DETECTED NOT DETECTED Final   Staphylococcus species DETECTED (A) NOT DETECTED Final    Comment: CRITICAL RESULT CALLED TO, READ BACK BY  AND VERIFIED WITH: PHARMD M.JAMES AT 1503 ON 05/202/2024 BY T.SAAD.    Staphylococcus aureus (BCID) NOT DETECTED NOT DETECTED Final   Staphylococcus epidermidis DETECTED (A) NOT DETECTED Final    Comment: Methicillin (oxacillin) resistant coagulase negative staphylococcus. Possible blood culture contaminant (unless isolated from more than one blood culture draw or clinical case suggests pathogenicity). No antibiotic treatment is indicated for blood  culture contaminants. CRITICAL RESULT CALLED TO, READ BACK BY AND VERIFIED WITH: PHARMD M.JAMES AT 1503 ON 05/202/2024 BY T.SAAD.    Staphylococcus lugdunensis NOT DETECTED NOT DETECTED Final   Streptococcus species NOT DETECTED NOT DETECTED Final   Streptococcus agalactiae NOT DETECTED NOT DETECTED Final   Streptococcus pneumoniae NOT DETECTED NOT DETECTED Final   Streptococcus pyogenes NOT DETECTED NOT DETECTED Final   A.calcoaceticus-baumannii  NOT DETECTED NOT DETECTED Final   Bacteroides fragilis NOT DETECTED NOT DETECTED Final   Enterobacterales NOT DETECTED NOT DETECTED Final   Enterobacter cloacae complex NOT DETECTED NOT DETECTED Final   Escherichia coli NOT DETECTED NOT DETECTED Final   Klebsiella aerogenes NOT DETECTED NOT DETECTED Final   Klebsiella oxytoca NOT DETECTED NOT DETECTED Final   Klebsiella pneumoniae NOT DETECTED NOT DETECTED Final   Proteus species NOT DETECTED NOT DETECTED Final   Salmonella species NOT DETECTED NOT DETECTED Final   Serratia marcescens NOT DETECTED NOT DETECTED Final   Haemophilus influenzae NOT DETECTED NOT DETECTED Final   Neisseria meningitidis NOT DETECTED NOT DETECTED Final   Pseudomonas aeruginosa NOT DETECTED NOT DETECTED Final   Stenotrophomonas maltophilia NOT DETECTED NOT DETECTED Final   Candida albicans NOT DETECTED NOT DETECTED Final   Candida auris NOT DETECTED NOT DETECTED Final   Candida glabrata NOT DETECTED NOT DETECTED Final   Candida krusei NOT DETECTED NOT DETECTED Final   Candida parapsilosis NOT DETECTED NOT DETECTED Final   Candida tropicalis NOT DETECTED NOT DETECTED Final   Cryptococcus neoformans/gattii NOT DETECTED NOT DETECTED Final   Methicillin resistance mecA/C DETECTED (A) NOT DETECTED Final    Comment: CRITICAL RESULT CALLED TO, READ BACK BY AND VERIFIED WITH: PHARMD M.JAMES AT 1503 ON 05/202/2024 BY T.SAAD. Performed at [facility], [address] [place], [place] [address]      SIGNED:   Marinda Elk, MD  [facility] 03/27/2023, 8:03 AM Pager   If 7PM-7AM, please contact night-coverage [email or web address] Password [email or web address]

## 2023-03-27 NOTE — Progress Notes (Signed)
Inpatient Rehabilitation Admission Medication Review by a Pharmacist  A complete drug regimen review was completed for this patient to identify any potential clinically significant medication issues.  High Risk Drug Classes Is patient taking? Indication by Medication  Antipsychotic Yes PRN prochlorperazine - nausea/vomiting  Anticoagulant No   Antibiotic Yes Unasyn - aspiration pneumonia  Opioid Yes PRN Norco - severe pain  Antiplatelet Yes Aspirin 81 mg BID - VTE prophylaxis s/p hip surgery (with SCDs)  Hypoglycemics/insulin No   Vasoactive Medication No   Chemotherapy No   Other Yes Flecainide - PSVT Docusate - laxative MVI - supplement Pantoprazole - GI prophylaxis  PRNs: Acetaminophen - mild pain Maalox - indigestion Diphenhydramine - itching Melatonin - sleep Methocarbamol - muscle relaxer Cepacol or Chloraseptic - sore throat Bisacodyl, fleets enema - constipation     Type of Medication Issue Identified Description of Issue Recommendation(s)  Drug Interaction(s) (clinically significant)     Duplicate Therapy     Allergy     No Medication Administration End Date  DC summary notes plan for 5 days of Unasyn.  Day #3.  Aspirin 81 mg BID post-op prophylaxis Will follow up for Unasyn stop time.   Follow up for stop time, 4 wks or as indicated.  Incorrect Dose     Additional Drug Therapy Needed     Significant med changes from prior encounter (inform family/care partners about these prior to discharge). Off Perservision AREDS 2 and Calcium + vitamin D supplements. Resume during CIR admit or at discharge.  Other       Clinically significant medication issues were identified that warrant physician communication and completion of prescribed/recommended actions by midnight of the next day:  No  Pharmacist comments:  - will follow up for stop time for Unasyn, expect thru 5/23 - and for length of VTE prophylaxis with Aspirin 81 mg BID, 4 weeks or as indicated.  Time  spent performing this drug regimen review (minutes):  [address]  Dennie Fetters, [place] 03/27/2023 1:18 PM

## 2023-03-27 NOTE — Progress Notes (Signed)
INPATIENT REHABILITATION ADMISSION NOTE   Arrival Method:[facility]     Mental Orientation: alert  Assessment:done   Skin:done checked with Misty Stanley RN   IV'S:SL RT f/a   Pain:none   Tubes and Drains:none   Safety Measures:done   Vital Signs:done   Height and Weight:done   Rehab Orientation:done   Family:grand daughter in room    Notes:

## 2023-03-27 NOTE — Progress Notes (Signed)
Inpatient Rehab Admissions Coordinator:    I have a CIR bed for this Pt. Today. RN may call report to [phone].  Pt. And daughter in agreement for Pt. To admit to CIR for intensive rehab program for an estimated 7-10 days with the goal of reaching supervision-modified independent level and returning home with daughter, son in law, and grandaughter to assist.   Megan Salon, MS, CCC-SLP Rehab Admissions Coordinator  [phone] (celll) [phone] (office)

## 2023-03-27 NOTE — Progress Notes (Signed)
PHARMACY NOTE:  ANTIMICROBIAL RENAL DOSAGE ADJUSTMENT  Current antimicrobial regimen includes a mismatch between antimicrobial dosage and estimated renal function.  As per policy approved by the Pharmacy & Therapeutics and Medical Executive Committees, the antimicrobial dosage will be adjusted accordingly.  Current antimicrobial dosage:  unasyn 1.5 gm q12h  Indication: aspiration PNA  Renal Function:  Estimated Creatinine Clearance: 32.8 mL/min (by C-G formula based on SCr of 0.75 mg/dL).   Antimicrobial dosage has been changed to:  unasyn 1.5gm IV q8h    Thank you for allowing pharmacy to be a part of this patient's care.  Maryellen Pile, [facility] 03/27/2023 5:39 AM

## 2023-03-27 NOTE — TOC Transition Note (Signed)
Transition of Care [facility]) - CM/SW Discharge Note   Patient Details  Name: Michelle Vazquez MRN: [id number] Date of Birth: [birth year]  [facility] CM/SW Contact:  Amada Jupiter, LCSW Phone Number: 03/27/2023, 9:55 AM   Clinical Narrative:     Pt accepted to [facility] and bed ready today.  CIR arranging discharge transportation via [facility].  Pt/ family aware and agreeable.  No further TOC needs.  Final next level of care: IP Rehab Facility Barriers to Discharge: Barriers Resolved   Patient Goals and CMS Choice      Discharge Placement                  Patient to be transferred to facility by: [facility] to CIR   Patient and family notified of of transfer: 03/27/23  Discharge Plan and Services Additional resources added to the After Visit Summary for   In-house Referral: Clinical Social Work              DME Arranged: N/A DME Agency: NA                  Social Determinants of Health (SDOH) Interventions SDOH Screenings   Food Insecurity: No Food Insecurity (03/21/2023)  Housing: Low Risk  (03/21/2023)  Transportation Needs: No Transportation Needs (03/21/2023)  Utilities: Not At Risk (03/21/2023)  Alcohol Screen: Low Risk  (06/16/2022)  Depression (PHQ2-9): Low Risk  (09/13/2022)  Financial Resource Strain: Low Risk  (06/16/2022)  Physical Activity: Sufficiently Active (06/16/2022)  Social Connections: Moderately Integrated (06/16/2022)  Stress: No Stress Concern Present (06/16/2022)  Tobacco Use: Low Risk  (03/26/2023)     Readmission Risk Interventions    03/23/2023    2:23 PM  Readmission Risk Prevention Plan  Post Dischage Appt Complete  Medication Screening Complete  Transportation Screening Complete

## 2023-03-27 NOTE — Discharge Summary (Signed)
Physician Discharge Summary  Patient ID: Michelle Vazquez MRN: [id number] DOB/AGE: Jul 20, 1929 89 y.o.  Admit date: 03/27/2023 Discharge date: 04/06/2023  Discharge Diagnoses:  Principal Problem:   Closed left hip fracture, sequela Active Problems:   Pharyngoesophageal dysphagia   Cough   Hypertension   CKD (chronic kidney disease) stage 3, GFR 30-59 ml/min (HCC)   Esophageal stenosis   Paroxysmal atrial fibrillation (HCC)   Discharged Condition: stable  Significant Diagnostic Studies: DG Chest 2 View  Result Date: 03/29/2023 CLINICAL DATA:  Cough EXAM: CHEST - 2 VIEW COMPARISON:  CXR 03/27/23 FINDINGS: Unchanged small left pleural effusion. Unchanged cardiac and mediastinal contours. Unchanged consolidative opacity at the left lung base which could represent atelectasis or infection. Prominent bilateral interstitial opacities are favored to represent pulmonary venous congestion. No radiographically apparent displaced rib fractures. Vertebral body heights are maintained. Visualized upper abdomen is unremarkable. Vertebral body heights are maintained. IMPRESSION: No significant change compared to recent prior chest radiograph dated 03/27/2023 with a small left pleural effusion and a consolidative left basilar opacity. Electronically Signed   By: Lorenza Cambridge M.D.   On: 03/29/2023 11:13   DG Chest 2 View  Result Date: 03/27/2023 CLINICAL DATA:  [id number] Follow-up exam [id number] EXAM: CHEST - 2 VIEW COMPARISON:  03/25/2023 FINDINGS: Persistent airspace consolidation medially at the left lung base. Adjacent left pleural effusion. Right lung clear. Heart size and mediastinal contours are within normal limits. Visualized bones unremarkable. IMPRESSION: Persistent left lower lobe consolidation and effusion. Electronically Signed   By: Corlis Leak M.D.   On: 03/27/2023 15:29   DG CHEST PORT 1 VIEW  Result Date: 03/25/2023 CLINICAL DATA:  [id number] SOB (shortness of breath) [id number] EXAM: PORTABLE CHEST - 1  VIEW COMPARISON:  03/21/2023 FINDINGS: New left pleural effusion, with interstitial and airspace opacities in the left lower lung. Right lung clear. Heart size upper limits normal. No pneumothorax. Visualized bones unremarkable. IMPRESSION: New left pleural effusion with left lower lung airspace disease. Electronically Signed   By: Corlis Leak M.D.   On: 03/25/2023 11:19   DG Pelvis Portable  Result Date: 03/23/2023 CLINICAL DATA:  Left hip fracture status post hemiarthroplasty EXAM: PORTABLE PELVIS 1-2 VIEWS COMPARISON:  03/23/2023, 03/21/2023 FINDINGS: Frontal view of the lower pelvis and bilateral hips was obtained. Left hip hemiarthroplasty is identified in the expected position without signs of acute complication. Right hip is stable. Postsurgical changes in the soft tissues overlying the left hip. IMPRESSION: 1. Unremarkable left hip hemiarthroplasty. Electronically Signed   By: Sharlet Salina M.D.   On: 03/23/2023 18:32   DG HIP UNILAT WITH PELVIS 1V LEFT  Result Date: 03/23/2023 CLINICAL DATA:  Intraoperative fluoroscopy for left hip arthroplasty. EXAM: DG HIP (WITH OR WITHOUT PELVIS) 1V*L* COMPARISON:  Pelvis and left hip radiographs 03/21/2023 FINDINGS: Images were performed intraoperatively without the presence of a radiologist. Interval placement of bipolar left hip hemiarthroplasty. No hardware complication is seen. Total fluoroscopy images: 3 Total fluoroscopy time: 12 seconds Total dose: Radiation Exposure Index (as provided by the fluoroscopic device): 0.923 mGy air Kerma Please see intraoperative findings for further detail. IMPRESSION: Intraoperative fluoroscopy for left hip hemiarthroplasty. Electronically Signed   By: Neita Garnet M.D.   On: 03/23/2023 18:20   DG C-Arm 1-60 Min-No Report  Result Date: 03/23/2023 Fluoroscopy was utilized by the requesting physician.  No radiographic interpretation.   ECHOCARDIOGRAM COMPLETE  Result Date: 03/22/2023    ECHOCARDIOGRAM REPORT    Patient Name:   Michelle Vazquez Date of  Exam: 03/22/2023 Medical Rec #:  [id number]     Height:       61.0 in Accession #:    [id number]    Weight:       100.0 lb Date of Birth:  [birth year]    BSA:          1.407 m Patient Age:    89 years      BP:           121/73 mmHg Patient Gender: F             HR:           81 bpm. Exam Location:  Inpatient Procedure: 2D Echo, Cardiac Doppler and Color Doppler Indications:    I34.8 Other nonrheumatic mitral valve disorders; I48.91*                 Unspeicified atrial fibrillation; preoperative evaluation  History:        Patient has prior history of Echocardiogram examinations. Mitral                 Valve Prolapse, Arrythmias:PVC; Risk Factors:Hypertension.  Sonographer:    Mike Gip Referring Phys: [id number] DAVID MANUEL ORTIZ IMPRESSIONS  1. Left ventricular ejection fraction, by estimation, is 55 to 60%. The left ventricle has normal function. The left ventricle has no regional wall motion abnormalities. Left ventricular diastolic parameters are consistent with Grade II diastolic dysfunction (pseudonormalization). Elevated left ventricular end-diastolic pressure.  2. Right ventricular systolic function is normal. The right ventricular size is mildly enlarged. There is normal pulmonary artery systolic pressure. The estimated right ventricular systolic pressure is 33.7 mmHg.  3. Left atrial size was severely dilated.  4. The mitral valve is myxomatous. Mild mitral valve regurgitation. No evidence of mitral stenosis. There is mild holosystolic prolapse of the middle scallop of the posterior leaflet of the mitral valve.  5. The aortic valve is normal in structure. Aortic valve regurgitation is mild. No aortic stenosis is present. Aortic regurgitation PHT measures 469 msec.  6. The inferior vena cava is normal in size with greater than 50% respiratory variability, suggesting right atrial pressure of 3 mmHg. FINDINGS  Left Ventricle: Left ventricular ejection fraction, by  estimation, is 55 to 60%. The left ventricle has normal function. The left ventricle has no regional wall motion abnormalities. The left ventricular internal cavity size was normal in size. There is  no left ventricular hypertrophy. Left ventricular diastolic parameters are consistent with Grade II diastolic dysfunction (pseudonormalization). Elevated left ventricular end-diastolic pressure. Right Ventricle: The right ventricular size is mildly enlarged. No increase in right ventricular wall thickness. Right ventricular systolic function is normal. There is normal pulmonary artery systolic pressure. The tricuspid regurgitant velocity is 2.77  m/s, and with an assumed right atrial pressure of 3 mmHg, the estimated right ventricular systolic pressure is 33.7 mmHg. Left Atrium: Left atrial size was severely dilated. Right Atrium: Right atrial size was normal in size. Pericardium: There is no evidence of pericardial effusion. Mitral Valve: The mitral valve is myxomatous. There is mild holosystolic prolapse of the middle scallop of the posterior leaflet of the mitral valve. Mild mitral valve regurgitation. No evidence of mitral valve stenosis. Tricuspid Valve: The tricuspid valve is normal in structure. Tricuspid valve regurgitation is trivial. No evidence of tricuspid stenosis. Aortic Valve: The aortic valve is normal in structure. Aortic valve regurgitation is mild. Aortic regurgitation PHT measures 469 msec. No aortic stenosis is present. Pulmonic Valve: The pulmonic valve was  normal in structure. Pulmonic valve regurgitation is not visualized. No evidence of pulmonic stenosis. Aorta: The aortic root is normal in size and structure. Venous: The inferior vena cava is normal in size with greater than 50% respiratory variability, suggesting right atrial pressure of 3 mmHg. IAS/Shunts: No atrial level shunt detected by color flow Doppler.  LEFT VENTRICLE PLAX 2D LVIDd:         3.10 cm     Diastology LVIDs:         2.20 cm      LV e' medial:    5.98 cm/s LV PW:         1.10 cm     LV E/e' medial:  16.6 LV IVS:        1.00 cm     LV e' lateral:   7.94 cm/s LVOT diam:     1.80 cm     LV E/e' lateral: 12.5 LV SV:         35 LV SV Index:   25 LVOT Area:     2.54 cm  LV Volumes (MOD) LV vol d, MOD A2C: 66.0 ml LV vol d, MOD A4C: 61.3 ml LV vol s, MOD A2C: 26.0 ml LV vol s, MOD A4C: 23.5 ml LV SV MOD A2C:     40.0 ml LV SV MOD A4C:     61.3 ml LV SV MOD BP:      38.9 ml RIGHT VENTRICLE             IVC RV Basal diam:  4.10 cm     IVC diam: 1.40 cm RV S prime:     10.90 cm/s TAPSE (M-mode): 2.2 cm LEFT ATRIUM             Index        RIGHT ATRIUM           Index LA diam:        2.40 cm 1.71 cm/m   RA Area:     14.50 cm LA Vol (A2C):   66.1 ml 46.99 ml/m  RA Volume:   35.50 ml  25.23 ml/m LA Vol (A4C):   62.0 ml 44.07 ml/m LA Biplane Vol: 65.4 ml 46.49 ml/m  AORTIC VALVE LVOT Vmax:   70.50 cm/s LVOT Vmean:  48.900 cm/s LVOT VTI:    0.139 m AI PHT:      469 msec  AORTA Ao Root diam: 2.90 cm Ao Asc diam:  2.50 cm MITRAL VALVE               TRICUSPID VALVE MV Area (PHT): 5.13 cm    TR Peak grad:   30.7 mmHg MV Decel Time: 148 msec    TR Vmax:        277.00 cm/s MV E velocity: 99.40 cm/s MV A velocity: 99.80 cm/s  SHUNTS MV E/A ratio:  1.00        Systemic VTI:  0.14 m                            Systemic Diam: 1.80 cm Armanda Magic MD Electronically signed by Armanda Magic MD Signature Date/Time: 03/22/2023/9:28:28 AM    Final    DG Hip Unilat W or Wo Pelvis 2-3 Views Left  Result Date: 03/21/2023 CLINICAL DATA:  Fall.  Left hip pain. EXAM: DG HIP (WITH OR WITHOUT PELVIS) 2-3V LEFT COMPARISON:  None Available. FINDINGS: There is subcapital impaction fracture of  the left femoral neck with mild superior displacement of the distal femur. IMPRESSION: Subcapital impaction fracture of the left femoral neck. Electronically Signed   By: Larose Hires D.O.   On: 03/21/2023 15:09   DG Chest 1 View  Result Date: 03/21/2023 CLINICAL DATA:  Preop  EXAM: CHEST  1 VIEW COMPARISON:  CXR 09/13/22 FINDINGS: No pleural effusion. No pneumothorax. Prominent cardiac contours. No focal airspace opacity. No radiographically displaced rib fractures. Visualized upper abdomen is unremarkable. IMPRESSION: No focal airspace opacity Electronically Signed   By: Lorenza Cambridge M.D.   On: 03/21/2023 15:09    Labs:  Basic Metabolic Panel:    Latest Ref Rng & Units 04/02/2023    5:36 AM 03/28/2023    6:08 AM 03/27/2023    3:36 AM  BMP  Glucose 70 - 99 mg/dL 98  94  161   BUN 8 - 23 mg/dL 23  17  21    Creatinine 0.44 - 1.00 mg/dL 0.96  0.45  4.09   Sodium 135 - 145 mmol/L 137  137  136   Potassium 3.5 - 5.1 mmol/L 4.3  3.7  3.7   Chloride 98 - 111 mmol/L 100  105  105   CO2 22 - 32 mmol/L 30  23  26    Calcium 8.9 - 10.3 mg/dL 8.6  7.8  7.6      CBC:    Latest Ref Rng & Units 04/02/2023    5:36 AM 03/28/2023    6:08 AM 03/27/2023    3:36 AM  CBC  WBC 4.0 - 10.5 K/uL 8.6  6.1  7.1   Hemoglobin 12.0 - 15.0 g/dL 9.8  9.3  9.1   Hematocrit 36.0 - 46.0 % 30.0  28.0  28.1   Platelets 150 - 400 K/uL 370  204  182      CBG: No results for input(s): "GLUCAP" in the last 168 hours.   Brief HPI:   Michelle Vazquez is a 89 y.o. female with history of CAF/PSVT, hypercontractile esophagus, Schatzki's ring, CKDIII, stress incontinence who was admitted to [facility] on 03/21/23 after a fall with onset of left hip pain and inability to walk. She was found to have left femoral neck Fx and underwent Left anterior hip hemi on 05/18 by Dr. Magnus Ivan. Post op course with hypotension requiring fluid bolus as well as acute hypoxic respiratory failure with cough and severe throat pain requiring supplemental oxygen. She was found to have LLL airway disease and started on IV Unasyn due to concerns of PNA. ABLA was being monitored and throat pain was improving with ability to tolerate intake. PT/OT was working with patient who continued to be limited by hypoxia, balance deficits, decreased  balance and debility. CIR recommended due to functional decline.    Hospital Course: Michelle Vazquez was admitted to rehab 03/27/2023 for inpatient therapies to consist of PT and OT at least three hours five days a week. Past admission physiatrist, therapy team and rehab RN have worked together to provide customized collaborative inpatient rehab.   Blood pressures were monitored on TID basis and  She reported difficulty swallowing due to severe pain and MMW was added to help with local measures as no thrush seen She continued to have issues with non productive cough and has been educated on use of flutter valve. Follow up CXR showed no significant change with pulmonary venous congestion and she was treated with lasix 20 mg for diuresis. She completed 7 day course of IV  antibiotics and has been weaned off oxygen. ST and GI were consulted for input on dysphagia which was reported to have worsened since surgery. MBS done showed no change in swallow function and was advised on focusing on liquids and shakes while admitted. She continues on D3, thins with recommendations to crush meds.     Rehab course: During patient's stay in rehab weekly team conferences were held to monitor patient's progress, set goals and discuss barriers to discharge. At admission, patient required mod assist with basic ADLs and min assist with mobility.  She  has had improvement in activity tolerance, balance, postural control as well as ability to compensate for deficits. She has had improvement in functional use LLE as well as improvement in awareness.  She is able to complete ADL tasks at modified independent level. She is able to perform transfers independently and ambulate 150' with RW. She requires supervision to climb 8 stairs.  She is able to utilize safe swallow strategies at modified independent level and swallow if at baseline per reports by patient and granddaughter. Family education has been completed regarding all aspects of  care.       Disposition: Home  Diet: Chopped/puree as tolerated. Needs to be upright and pace yourself.   Special Instructions: Use flutter valve four times a day and more frequently for congestion/cough. 2.    Allergies as of 04/06/2023       Reactions   Diltiazem Hcl Other (See Comments)   Rapid heartbeat and elevated blood pressure   Inderal [propranolol Hcl]    Pt does not remember reaction   Lopressor [metoprolol Tartrate]    Per the pt' "I felt like I was going to pass out"   Penicillins Diarrhea   "That was 50 years ago"         Medication List     STOP taking these medications    ampicillin-sulbactam 1.5 g in sodium chloride 0.9 % 100 mL       TAKE these medications    acetaminophen 325 MG tablet Commonly known as: TYLENOL Take 1-2 tablets (325-650 mg total) by mouth every 4 (four) hours as needed for mild pain.   Aspirin Low Dose 81 MG chewable tablet Generic drug: aspirin Chew 1 tablet (81 mg total) by mouth 2 (two) times daily for 30 days then as directed by MD   D-Vite Pediatric 10 MCG/ML Liqd oral liquid Generic drug: cholecalciferol Take 1 mL (400 Units total) by mouth daily.   famotidine 20 MG tablet Commonly known as: PEPCID Take 1 tablet (20 mg total) by mouth 2 (two) times daily.   ferrous sulfate 220 (44 Fe) MG/5ML solution Take 6.8 mLs (300 mg total) by mouth daily with breakfast.   flecainide 50 MG tablet Commonly known as: TAMBOCOR TAKE 1 & 1/2 (ONE & ONE-HALF) TABLETS BY MOUTH TWICE DAILY What changed: See the new instructions.   furosemide 20 MG tablet Commonly known as: Lasix Take 1 tablet (20 mg total) by mouth daily as needed. Notes to patient: Weight yourself daily--can use lasix if your weight goes up by 3 lbs overnight.. Need to follow up with primary care for labs/kidney function.    HYDROcodone-acetaminophen 5-325 MG tablet Commonly known as: NORCO/VICODIN Take 1-2 tablets by mouth every 6 (six) hours as needed for  moderate pain. What changed: reasons to take this Notes to patient: LIMIT TO ONE PILL TWICE A DAY AS NEEDED   multivitamin with minerals Tabs tablet Take 1 tablet by mouth daily.  gummies   nystatin 100000 UNIT/ML suspension Commonly known as: MYCOSTATIN Take 5 mLs (500,000 Units total) by mouth 4 (four) times daily.   polyethylene glycol 17 g packet Commonly known as: MIRALAX / GLYCOLAX Take 17 g by mouth daily as needed. Use 1/2 to one scoop daily for constipation   PRESERVISION AREDS 2+MULTI VIT PO Take 1 capsule by mouth in the morning and at bedtime.   traZODone 50 MG tablet Commonly known as: DESYREL Take 0.5 tablets (25 mg total) by mouth at bedtime as needed for sleep.   Viactiv Calcium Plus D 650-12.5-40 MG-MCG-MCG Chew Generic drug: Calcium-Vitamin D-Vitamin K Chew 1 each by mouth daily.        Follow-up Information     Pincus Sanes, MD Follow up.   Specialty: Internal Medicine Why: Call in 1-2 days for post hospital follow up Contact information: [address] [place] [address] [phone]         Horton Chin, MD Follow up.   Specialty: Physical Medicine and Rehabilitation Why: office will call you with follow up appointment Contact information: [address] [place] [address] [phone]         Kathryne Hitch, MD Follow up.   Specialty: Orthopedic Surgery Why: Appointment 06/03 at 10:30 am  Contact information: [address] [place] [address] [phone]                 Signed: Jacquelynn Cree 04/06/2023, 6:00 PM

## 2023-03-28 DIAGNOSIS — S72002S Fracture of unspecified part of neck of left femur, sequela: Secondary | ICD-10-CM | POA: Diagnosis not present

## 2023-03-28 LAB — COMPREHENSIVE METABOLIC PANEL
ALT: 10 U/L (ref 0–44)
AST: 18 U/L (ref 15–41)
Albumin: 2.1 g/dL — ABNORMAL LOW (ref 3.5–5.0)
Alkaline Phosphatase: 43 U/L (ref 38–126)
Anion gap: 9 (ref 5–15)
BUN: 17 mg/dL (ref 8–23)
CO2: 23 mmol/L (ref 22–32)
Calcium: 7.8 mg/dL — ABNORMAL LOW (ref 8.9–10.3)
Chloride: 105 mmol/L (ref 98–111)
Creatinine, Ser: 0.72 mg/dL (ref 0.44–1.00)
GFR, Estimated: >60 mL/min (ref >60)
Glucose, Bld: 94 mg/dL (ref 70–99)
Potassium: 3.7 mmol/L (ref 3.5–5.1)
Sodium: 137 mmol/L (ref 135–145)
Total Bilirubin: 0.7 mg/dL (ref 0.3–1.2)
Total Protein: 5 g/dL — ABNORMAL LOW (ref 6.5–8.1)

## 2023-03-28 LAB — MAGNESIUM: Magnesium: 1.9 mg/dL (ref 1.7–2.4)

## 2023-03-28 LAB — CBC WITH DIFFERENTIAL/PLATELET
Abs Immature Granulocytes: 0.03 10*3/uL (ref 0.00–0.07)
Basophils Absolute: 0 10*3/uL (ref 0.0–0.1)
Basophils Relative: 1 %
Eosinophils Absolute: 0.3 10*3/uL (ref 0.0–0.5)
Eosinophils Relative: 4 %
HCT: 28 % — ABNORMAL LOW (ref 36.0–46.0)
Hemoglobin: 9.3 g/dL — ABNORMAL LOW (ref 12.0–15.0)
Immature Granulocytes: 1 %
Lymphocytes Relative: 26 %
Lymphs Abs: 1.6 10*3/uL (ref 0.7–4.0)
MCH: 31.6 pg (ref 26.0–34.0)
MCHC: 33.2 g/dL (ref 30.0–36.0)
MCV: 95.2 fL (ref 80.0–100.0)
Monocytes Absolute: 0.8 10*3/uL (ref 0.1–1.0)
Monocytes Relative: 13 %
Neutro Abs: 3.4 10*3/uL (ref 1.7–7.7)
Neutrophils Relative %: 55 %
Platelets: 204 10*3/uL (ref 150–400)
RBC: 2.94 MIL/uL — ABNORMAL LOW (ref 3.87–5.11)
RDW: 12.9 % (ref 11.5–15.5)
WBC: 6.1 10*3/uL (ref 4.0–10.5)
nRBC: 0 % (ref 0.0–0.2)

## 2023-03-28 LAB — CULTURE, BLOOD (ROUTINE X 2): Culture: NO GROWTH

## 2023-03-28 LAB — IRON AND TIBC
Iron: 28 ug/dL (ref 28–170)
Saturation Ratios: 12 % (ref 10.4–31.8)
TIBC: 232 ug/dL — ABNORMAL LOW (ref 250–450)
UIBC: 204 ug/dL

## 2023-03-28 LAB — VITAMIN D 25 HYDROXY (VIT D DEFICIENCY, FRACTURES): Vit D, 25-Hydroxy: 40.39 ng/mL (ref 30–100)

## 2023-03-28 MED ORDER — MAGNESIUM GLUCONATE 500 MG PO TABS
250.0000 mg | ORAL_TABLET | Freq: Every day | ORAL | Status: DC
  Administered 2023-03-28 – 2023-03-29 (×2): 250 mg via ORAL
  Filled 2023-03-28 (×2): qty 1

## 2023-03-28 MED ORDER — SODIUM CHLORIDE 0.9 % IV SOLN: INTRAVENOUS | Status: DC | PRN

## 2023-03-28 MED ORDER — PANTOPRAZOLE SODIUM 40 MG PO TBEC
40.0000 mg | DELAYED_RELEASE_TABLET | Freq: Every day | ORAL | Status: DC
  Administered 2023-03-28 – 2023-03-29 (×2): 40 mg via ORAL
  Filled 2023-03-28 (×2): qty 1

## 2023-03-28 MED ORDER — ACETAMINOPHEN 325 MG PO TABS: 325.0000 mg | ORAL_TABLET | ORAL | Status: DC | PRN

## 2023-03-28 MED ORDER — CALCIUM CARBONATE ANTACID 500 MG PO CHEW
1.0000 | CHEWABLE_TABLET | Freq: Every day | ORAL | Status: DC
  Administered 2023-03-28 – 2023-04-07 (×10): 200 mg via ORAL
  Filled 2023-03-28 (×11): qty 1

## 2023-03-28 NOTE — Patient Care Conference (Signed)
Inpatient RehabilitationTeam Conference and Plan of Care Update Date: 03/28/2023   Time: 11:31 AM    Patient Name: Michelle Vazquez      Medical Record Number: [id number]  Date of Birth: 1929/11/03 Sex: Female         Room/Bed: 4M03C/4M03C-01 Payor Info: Payor: MEDICARE / Plan: MEDICARE PART A AND B / Product Type: *No Product type* /    Admit Date/Time:  03/27/2023 12:32 PM  Primary Diagnosis:  Closed left hip fracture, sequela  Hospital Problems: Principal Problem:   Closed left hip fracture, sequela    Expected Discharge Date: Expected Discharge Date: 04/09/23  Team Members Present: Physician leading conference: Dr. Sula Soda Social Worker Present: Lavera Guise, BSW Nurse Present: Chana Bode, RN PT Present: Raechel Chute, PT OT Present: Candee Furbish, OT SLP Present: Other (comment) Fae Pippin, SLP) PPS Coordinator present : Fae Pippin, SLP     Current Status/Progress Goal Weekly Team Focus  Bowel/Bladder   Pt is continent B/B withsome bladder incontinent episode. Bladder sacn q8 hours.  LBM 03/27/23   Will regain normal bladder pattern   Assist with toileting needs qshift/prn    Swallow/Nutrition/ Hydration   Eval pending           ADL's   Supervision UB for [place] cord managment only, Mod A LB, Mod A toileting   Mod I/supervision   ADL retraining, balance, endurance, sit > stand    Mobility   supine<>sit supervision with HOB elevated and bedrails, transfers with RW min A, gait 17ft with RW min A - on 2L   supervision/mod I  functional mobility/transfers, generalized strengthening and endurance, ROM, dyanmic standing balance/coordination, gait training, D/C planning, and DME    Communication                Safety/Cognition/ Behavioral Observations               Pain   Verbalizes pain to left hip incision. Pain well managed with prn pain medications   Will be free from pain   Assess pt for pain and administer prn pain meds per  orders    Skin   Surgical incision toleft hip with aquacel dressing in place.   Skin will be free from infection  Assess sin for infection and promote healing      Discharge Planning:    1 level 4 ste right rail with family to provide 24/7 care  Team Discussion: Patient post left hip fracture with history of dysphagia (followed at [facility]) on supplemental oxygen post acute respiratory failure with hypoxia with aspiration pneumonia treated with abx. Continue to note coughing and stress incontinence with coughing and fatigue as sleep disrupted by coughing.  Patient on target to meet rehab goals: yes, currently needs supervision for upper body care and mod assist for lower body care and toileting. Transfers wit min assist and able to ambulate up to 21' with min assist. Goals for discharge set for mod I overall.  *See Care Plan and progress notes for long and short-term goals.   Revisions to Treatment Plan:  SLP eval and treat order to follow up on dysphagia/esophogeal stricture   Teaching Needs: Safety, medications, dietary modifications, transfers, toileting,etc.   Current Barriers to Discharge: Decreased caregiver support and Home enviroment access/layout; 4 ste/ right rail  Possible Resolutions to Barriers: Family education HH follow up services DME: RW     Medical Summary Current Status: hypocalcemia, esophageal strictures, low protein level, underweight, hypertension, insomnia, left hip  fracture, cough  Barriers to Discharge: Medical stability  Barriers to Discharge Comments: hypocalcemia, esophageal strictures, low protein level, underweight, hypertension, insomnia, left hip fracture, cough Possible Resolutions to [employer] Focus: add tums after supper, discussed her prior procedures, SLP eval ordered, provided dietary education, add magnesium supplement at night, CXR reviewed, continue O2   Continued Need for Acute Rehabilitation Level of Care: The patient  requires daily medical management by a physician with specialized training in physical medicine and rehabilitation for the following reasons: Direction of a multidisciplinary physical rehabilitation program to maximize functional independence : Yes Medical management of patient stability for increased activity during participation in an intensive rehabilitation regime.: Yes Analysis of laboratory values and/or radiology reports with any subsequent need for medication adjustment and/or medical intervention. : Yes   I attest that I was present, lead the team conference, and concur with the assessment and plan of the team.   Chana Bode B 03/28/2023, 2:46 PM

## 2023-03-28 NOTE — Progress Notes (Signed)
Inpatient Rehabilitation Center Individual Statement of Services  Patient Name:  CAMYA CERUTTI  Date:  03/28/2023  Welcome to the Inpatient Rehabilitation Center.  Our goal is to provide you with an individualized program based on your diagnosis and situation, designed to meet your specific needs.  With this comprehensive rehabilitation program, you will be expected to participate in at least 3 hours of rehabilitation therapies Monday-Friday, with modified therapy programming on the weekends.  Your rehabilitation program will include the following services:  Physical Therapy (PT), Occupational Therapy (OT), Speech Therapy (ST), 24 hour per day rehabilitation nursing, Therapeutic Recreaction (TR), Neuropsychology, Care Coordinator, Rehabilitation Medicine, Nutrition Services, Pharmacy Services, and Other  Weekly team conferences will be held on  Wednesdays to discuss your progress.  Your Inpatient Rehabilitation Care Coordinator will talk with you frequently to get your input and to update you on team discussions.  Team conferences with you and your family in attendance may also be held.  Expected length of stay: 10-12 Days  Overall anticipated outcome: Supervision to mod I   Depending on your progress and recovery, your program may change. Your Inpatient Rehabilitation Care Coordinator will coordinate services and will keep you informed of any changes. Your Inpatient Rehabilitation Care Coordinator's name and contact numbers are listed  below.  The following services may also be recommended but are not provided by the Inpatient Rehabilitation Center:   Home Health Rehabiltiation Services Outpatient Rehabilitation Services    Arrangements will be made to provide these services after discharge if needed.  Arrangements include referral to agencies that provide these services.  Your insurance has been verified to be:   Medicare A & B Your primary doctor is:  Cheryll Cockayne, MD  Pertinent  information will be shared with your doctor and your insurance company.  Inpatient Rehabilitation Care Coordinator:  Lavera Guise, [place] [phone] or [phone]  Information discussed with and copy given to patient by: Andria Rhein, 03/28/2023, 9:27 AM

## 2023-03-28 NOTE — Plan of Care (Signed)
  Problem: RH Balance Goal: LTG Patient will maintain dynamic standing with ADLs (OT) Description: LTG:  Patient will maintain dynamic standing balance with assist during activities of daily living (OT)  Flowsheets (Taken 03/28/2023 1205) LTG: Pt will maintain dynamic standing balance during ADLs with: Independent with assistive device   Problem: Sit to Stand Goal: LTG:  Patient will perform sit to stand in prep for activites of daily living with assistance level (OT) Description: LTG:  Patient will perform sit to stand in prep for activites of daily living with assistance level (OT) Flowsheets (Taken 03/28/2023 1205) LTG: PT will perform sit to stand in prep for activites of daily living with assistance level: Independent with assistive device   Problem: RH Bathing Goal: LTG Patient will bathe all body parts with assist levels (OT) Description: LTG: Patient will bathe all body parts with assist levels (OT) Flowsheets (Taken 03/28/2023 1205) LTG: Pt will perform bathing with assistance level/cueing: Independent with assistive device    Problem: RH Dressing Goal: LTG Patient will perform lower body dressing w/assist (OT) Description: LTG: Patient will perform lower body dressing with assist, with/without cues in positioning using equipment (OT) Flowsheets (Taken 03/28/2023 1205) LTG: Pt will perform lower body dressing with assistance level of: Independent with assistive device   Problem: RH Toileting Goal: LTG Patient will perform toileting task (3/3 steps) with assistance level (OT) Description: LTG: Patient will perform toileting task (3/3 steps) with assistance level (OT)  Flowsheets (Taken 03/28/2023 1205) LTG: Pt will perform toileting task (3/3 steps) with assistance level: Independent with assistive device   Problem: RH Simple Meal Prep Goal: LTG Patient will perform simple meal prep w/assist (OT) Description: LTG: Patient will perform simple meal prep with assistance, with/without  cues (OT). Flowsheets (Taken 03/28/2023 1205) LTG: Pt will perform simple meal prep with assistance level of: Independent with assistive device   Problem: RH Toilet Transfers Goal: LTG Patient will perform toilet transfers w/assist (OT) Description: LTG: Patient will perform toilet transfers with assist, with/without cues using equipment (OT) Flowsheets (Taken 03/28/2023 1205) LTG: Pt will perform toilet transfers with assistance level of: Independent with assistive device   Problem: RH Tub/Shower Transfers Goal: LTG Patient will perform tub/shower transfers w/assist (OT) Description: LTG: Patient will perform tub/shower transfers with assist, with/without cues using equipment (OT) Flowsheets (Taken 03/28/2023 1205) LTG: Pt will perform tub/shower stall transfers with assistance level of: Supervision/Verbal cueing

## 2023-03-28 NOTE — Progress Notes (Signed)
PROGRESS NOTE   Subjective/Complaints: No new complaints this morning Discussed lab work Discussed O2 saturation +cough  ROS +cough   Objective:   DG Chest 2 View  Result Date: 03/27/2023 CLINICAL DATA:  [id number] Follow-up exam [id number] EXAM: CHEST - 2 VIEW COMPARISON:  03/25/2023 FINDINGS: Persistent airspace consolidation medially at the left lung base. Adjacent left pleural effusion. Right lung clear. Heart size and mediastinal contours are within normal limits. Visualized bones unremarkable. IMPRESSION: Persistent left lower lobe consolidation and effusion. Electronically Signed   By: Corlis Leak M.D.   On: 03/27/2023 15:29   Recent Labs    03/27/23 0336 03/28/23 0608  WBC 7.1 6.1  HGB 9.1* 9.3*  HCT 28.1* 28.0*  PLT 182 204   Recent Labs    03/27/23 0336 03/28/23 0608  NA 136 137  K 3.7 3.7  CL 105 105  CO2 26 23  GLUCOSE 109* 94  BUN 21 17  CREATININE 0.75 0.72  CALCIUM 7.6* 7.8*    Intake/Output Summary (Last 24 hours) at 03/28/2023 1134 Last data filed at 03/28/2023 0846 Gross per 24 hour  Intake 117 ml  Output --  Net 117 ml        Physical Exam: Vital Signs Blood pressure (!) 143/83, pulse 99, temperature 98.3 F (36.8 C), temperature source Oral, resp. rate 16, height 5\' 1"  (1.549 m), weight 43.9 kg, SpO2 98 %. Constitutional:      Appearance: Normal appearance. She is normal weight. BMI 18.29    Interventions: Nasal cannula in place.     Comments: Pt awake, alert, appropriate, granddaughter at bedside; pt on 2L O2 by [place], appears younger than stated age, NAD, sitting up in bedside chair  HENT:     Head: Normocephalic and atraumatic.     Right Ear: External ear normal.     Left Ear: External ear normal.     Nose: Nose normal. No congestion.     Mouth/Throat:     Mouth: Mucous membranes are dry.     Pharynx: Oropharynx is clear. No oropharyngeal exudate.  Eyes:     General:        Right eye:  No discharge.        Left eye: No discharge.     Extraocular Movements: Extraocular movements intact.  Cardiovascular:     Rate and Rhythm: Normal rate. Rhythm irregular.     Heart sounds: Normal heart sounds. No murmur heard.    No gallop.  Pulmonary:     Comments: Decreased at bases. Intermittent cough with dysphonia.  Coughed more when attempted nectar thick liquids.  Abdominal:     General: Abdomen is flat. Bowel sounds are normal. There is no distension.     Palpations: Abdomen is soft.     Tenderness: There is no abdominal tenderness.  Musculoskeletal:     Cervical back: Neck supple. No tenderness.     Comments: Left hip incision with surgical foam dressing. Min edema left hip and thigh. Linear ecchymotic area upper right thigh.    UE strength 5-/5 throughout B/L RLE 5-/5 and LLE 2/5 in HF, otherwise 5-/5  Skin:    General: Skin is warm and dry.  Neurological:     Mental Status: She is alert and oriented to person, place, and time.     Comments: Intact to light touch in all  4 extremities  Psychiatric:        Mood and Affect: Mood normal.        Behavior: Behavior normal.    Assessment/Plan: 1. Functional deficits which require 3+ hours per day of interdisciplinary therapy in a comprehensive inpatient rehab setting. Physiatrist is providing close team supervision and 24 hour management of active medical problems listed below. Physiatrist and rehab team continue to assess barriers to discharge/monitor patient progress toward functional and medical goals  Care Tool:  Bathing              Bathing assist       Upper Body Dressing/Undressing Upper body dressing        Upper body assist      Lower Body Dressing/Undressing Lower body dressing            Lower body assist       Toileting Toileting    Toileting assist       Transfers Chair/bed transfer  Transfers assist           Locomotion Ambulation   Ambulation assist      Assist  level: Minimal Assistance - Patient > 75% Assistive device: Walker-rolling Max distance: 65ft   Walk 10 feet activity   Assist     Assist level: Minimal Assistance - Patient > 75% Assistive device: Walker-rolling   Walk 50 feet activity   Assist Walk 50 feet with 2 turns activity did not occur: Safety/medical concerns (weakness/deconditioning, fatigue)         Walk 150 feet activity   Assist Walk 150 feet activity did not occur: Safety/medical concerns (weakness/deconditioning, fatigue)         Walk 10 feet on uneven surface  activity   Assist Walk 10 feet on uneven surfaces activity did not occur: Safety/medical concerns (weakness/deconditioning, fatigue)         Wheelchair     Assist Is the patient using a wheelchair?: Yes Type of Wheelchair: Manual    Wheelchair assist level: Dependent - Patient 0% Max wheelchair distance: 21ft    Wheelchair 50 feet with 2 turns activity    Assist    Wheelchair 50 feet with 2 turns activity did not occur: Safety/medical concerns (weakness/deconditioning, fatigue)       Wheelchair 150 feet activity     Assist  Wheelchair 150 feet activity did not occur: Safety/medical concerns (weakness/deconditioning, fatigue)       Blood pressure (!) 143/83, pulse 99, temperature 98.3 F (36.8 C), temperature source Oral, resp. rate 16, height 5\' 1"  (1.549 m), weight 43.9 kg, SpO2 98 %.    Medical Problem List and Plan: 1. Functional deficits secondary to L femoral neck fx s/p hemiarthroplasty             -patient may  shower with covering incision             -ELOS/Goals: 10-12 days mod I to supervision  Grounds pass ordered  [employer] today   2.  Antithrombotics: -DVT/anticoagulation:  SCDs/TEDs/ASA              -antiplatelet therapy: ASA 81 mg po BID 3. Pain Management: Hydrocodone prn vs tylenol prn.  4. Mood/Behavior/Sleep: LCSW to follow for evaluation and support.               -  antipsychotic agents: N/A 5. Neuropsych/cognition: This patient is capable of making decisions on her own behalf. 6. Skin/Wound Care: Routine pressure relief measures.  7. Fluids/Electrolytes/Nutrition:  Monitor I/O. Check CMET in am. 8. Left hip subcapital femoral neck  fx s/p anterior hemi: WBAT 9. Aspiration PNA: Continues to have frequent cough-->will check f/u CXR to rule out underlying overload.             --Continue Unasyn D# 3/5-7?             -con't 2L O2 by [place]- wean as tolerated 10. Severe GERD: On Protonix IV BID-->will change to oral route 11. Chronic dysphagia/Esophageal stricture: Is able to modify diet             --last swallow study without evidence of aspiration and no change in stricture for past 4 years             --does not like Ensure--make her own shakes at home. Will try [employer] supplement. Pt CAN use nectar thick liquids if she wants, however can stay on thin liquids.   12. CAF: Monitor HR TID--continue Tambocor 13. ABLA: Recheck CBC in am. 14. Fluid overload: Monitor weight daily. Encourage low salt diet. Current weight reviewed and is 18.29  15. Underweight: provided dietary education  16. Difficulty swallowing 2/2 chronic esophageal stricture: SLP ordered  >50 minutes spent in discussion of her lab results, provided dietary education, team conference, grounds pass ordered, discussed that she did have fluid overload and that is why she is having daily weights, current weight reviewed and is 18.29, discussed that SLP ordered given cough/difficulty swallowing  LOS: 1 days A FACE TO FACE EVALUATION WAS PERFORMED  Drema Pry Sayda Grable 03/28/2023, 11:34 AM

## 2023-03-28 NOTE — Evaluation (Signed)
Physical Therapy Assessment and Plan  Patient Details  Name: Michelle Vazquez MRN: [id number] Date of Birth: Mar 18, 1929  PT Diagnosis: Abnormal posture, Abnormality of gait, Difficulty walking, Edema, and Muscle weakness Rehab Potential: Good ELOS: 10-12 days   Today's Date: 03/28/2023 PT Individual Time: 0830-0927 PT Individual Time Calculation (min): 57 min    Hospital Problem: Principal Problem:   Closed left hip fracture, sequela   Past Medical History:  Past Medical History:  Diagnosis Date   Arthritis    Atrial fibrillation (HCC)    Bowel obstruction (HCC) 2000   Cataract    bilateral removed   Complication of anesthesia    hard to wake after hysterectomy, states heart stopped during a Esophagoscopy, but states she has had several surgeries and procedures with anesthesia and has never had that again   Dysrhythmia    HLD (hyperlipidemia)    Hypercontractile esophagus    Mitral valve prolapse    Osteoporosis    declines rx and declines DEXA f/u   Parathyroid adenoma    post parathyroid surgery in October of 2008   Paroxysmal supraventricular tachycardia    Pneumonia    as a child   Premature ventricular contractions (PVCs) (VPCs)    Schatzki's ring 08/2013   Dr. Rhea Belton, s/p dilation 08/2013, ?benefit   Squamous cell carcinoma in situ of skin    face   Urinary incontinence    Past Surgical History:  Past Surgical History:  Procedure Laterality Date   ABDOMINAL HYSTERECTOMY  1989   BREAST BIOPSY  1979   left   CATARACT EXTRACTION     bilateral   ESOPHAGEAL MANOMETRY N/A 11/17/2015   Procedure: ESOPHAGEAL MANOMETRY (EM);  Surgeon: Beverley Fiedler, MD;  Location: [facility];  Service: Gastroenterology;  Laterality: N/A;   ESOPHAGOSCOPY  09/17/2019   Procedure: Cervical Esophagoscopy;  Surgeon: Osborn Coho, MD;  Location: [facility] OR;  Service: ENT;;   EYE SURGERY     LAPAROSCOPIC SMALL BOWEL RESECTION  2000   LARYNGOSCOPY AND ESOPHAGOSCOPY N/A 10/21/2021    Procedure: DIRECT LARYNGOSCOPY AND ESOPHAGOSCOPY WITH DILATION OF ESOPHAGEAL NARROWING;  Surgeon: Osborn Coho, MD;  Location: [facility] OR;  Service: ENT;  Laterality: N/A;   LARYNGOSCOPY AND ESOPHAGOSCOPY N/A 07/26/2022   Procedure: DIRECT LARYNGOSCOPY; ESOPHAGOSCOPY AND ESOPHAGEAL DILATION;  Surgeon: Osborn Coho, MD;  Location: [facility] OR;  Service: ENT;  Laterality: N/A;   MICROLARYNGOSCOPY WITH DILATION N/A 09/17/2019   Procedure: MICROLARYNGOSCOPY WITH DILATION;  Surgeon: Osborn Coho, MD;  Location: [facility] OR;  Service: ENT;  Laterality: N/A;   parathyroid surgery  2008?   left   SHOULDER SURGERY  02/1999   right   SKIN LESION EXCISION  08/1999   left side of face   TOTAL HIP ARTHROPLASTY Left 03/23/2023   Procedure: TOTAL HIP ARTHROPLASTY ANTERIOR APPROACH;  Surgeon: Kathryne Hitch, MD;  Location: [facility] ORS;  Service: Orthopedics;  Laterality: Left;    Assessment & Plan Clinical Impression: Patient is a 89 y.o. year old female with history of CAF/PSVT- on flecainide, hypercontractile esophagus, Schatzki's ring (Dr. Rhea Belton), CKD III, parathyroid adenoma, stress incontinence/nocturia;  who was admitted to [facility] on 03/21/23 after fall with onset left hip pain and inability to walk or bear weight on Left leg. She was found to have mildly elevated WBC-11.6 with  2D echo done showing EF 55-60% with no wall abnormality, severely dilated LA with grade 2 DD and myxomatous mitral valve.  She was found to have impacted left femoral neck fracture and  was underwent left anterior hip hemi by Dr. Magnus Ivan on 03/23/23. Post op WBAT and on low dose ASA bid for DVT prophylaxis. Post op on 05/18, she  hypotension with BP down to 70/52 with standing attempts and she was bolused with 1 Liter IVF. On 05/19, she developed acute hypoxic respiratory failure with congested cough and need for supplemental oxygen. CXR done showing new pleural effusion with LLL airspace disease. She was started on IV unasyn due to  concerns of PNA. Cough improving but continues to have throat discomfort affecting intake.   ABLA being monitored. BP improved and pain is controlled. PT/OT has been working with patient who is limited by hypoxia, balance deficits, debility and decreased balance. CIR recommended due to functional decline.    Pt reports LBM yesterday- first day since surgery had BM. Has a cough whenever tries to swallow fluids, even when we tried nectar thick liquids when doing admission. On 2L O2- breathing well unless drinks.  Pain moderate, but usually only taking tylenol- added Norco, per granddaughter.  Says lives on edge of dehydration due to difficulty swallowing fluids. Milk is easiest to swallow.  Cannot lay flat, due to choking feeling.   Patient currently requires min with mobility secondary to muscle weakness and muscle joint tightness, decreased cardiorespiratoy endurance and decreased oxygen support, and decreased standing balance, decreased postural control, and decreased balance strategies.  Prior to hospitalization, patient was independent  with mobility and lived with Family (reports granddaughter (2 yo) moved in about a month ago for reasons unrelated to pt's independence. Plans to remain in the home for foreseeable future) in a House home.  Home access is 4Stairs to enter.  Patient will benefit from skilled PT intervention to maximize safe functional mobility, minimize fall risk, and decrease caregiver burden for planned discharge home with intermittent assist.  Anticipate patient will benefit from follow up [facility] at discharge.  PT - End of Session Activity Tolerance: Tolerates 30+ min activity with multiple rests Endurance Deficit: Yes Endurance Deficit Description: required rest breaks throughout PT Assessment Rehab Potential (ACUTE/IP ONLY): Good PT Barriers to Discharge: Inaccessible home environment;Home environment access/layout;Incontinence;Wound Care;Other (comments) PT Barriers to  Discharge Comments: oxygen, 4 STE with 1 handrail, lives alone and granddaughter is available intermittently. PT Patient demonstrates impairments in the following area(s): Balance;Edema;Endurance;Nutrition;Pain;Skin Integrity PT Transfers Functional Problem(s): Bed Mobility;Bed to Chair;Car;Furniture PT Locomotion Functional Problem(s): Ambulation;Wheelchair Mobility;Stairs PT Plan PT Intensity: Minimum of 1-2 x/day ,45 to 90 minutes PT Frequency: 5 out of 7 days PT Duration Estimated Length of Stay: 10-12 days PT Treatment/Interventions: Ambulation/gait training;Discharge planning;Functional mobility training;Psychosocial support;Therapeutic Activities;Balance/vestibular training;Disease management/prevention;Neuromuscular re-education;Skin care/wound management;Therapeutic Exercise;Wheelchair propulsion/positioning;DME/adaptive equipment instruction;Pain management;UE/LE Strength taining/ROM;Community reintegration;Patient/family education;Stair training;UE/LE Coordination activities PT Transfers Anticipated Outcome(s): mod I with LRAD PT Locomotion Anticipated Outcome(s): mod I with LRAD PT Recommendation Recommendations for Other Services: Therapeutic Recreation consult Therapeutic Recreation Interventions: Pet therapy Follow Up Recommendations: Home health PT Patient destination: Home Equipment Recommended: To be determined Equipment Details: has [facility]   PT Evaluation Precautions/Restrictions Precautions Precautions: Fall Precaution Comments: monitor O2 Restrictions Weight Bearing Restrictions: No LLE Weight Bearing: Weight bearing as tolerated Pain Interference Pain Interference Pain Effect on Sleep: 3. Frequently Pain Interference with Therapy Activities: 1. Rarely or not at all Pain Interference with Day-to-Day Activities: 1. Rarely or not at all Home Living/Prior Functioning Home Living Available Help at Discharge: Family;Available 24 hours/day (has 4 daughters that plan to  rotate supervision/assist) Type of Home: House Home Access: Stairs to enter [employer]  of Steps: 4 Entrance Stairs-Rails: Right;Left (2 railings at front entrance but too far apart to reach. Back entrance has 1 railing on L side (preferred entrance).) Home Layout: One level Bathroom Shower/Tub: Walk-in shower;Door (sliding door) Bathroom Toilet: Handicapped height Bathroom Accessibility: Yes  Lives With: Family (reports granddaughter (47 yo) moved in about a month ago for reasons unrelated to pt's independence. Plans to remain in the home for foreseeable future) Prior Function Level of Independence: Independent with basic ADLs;Independent with transfers;Independent with gait;Independent with homemaking with ambulation  Able to Take Stairs?: Yes Driving: Yes Vision/Perception  Vision - History Ability to See in Adequate Light: 0 Adequate Perception Perception: Within Functional Limits Praxis Praxis: Intact  Cognition Overall Cognitive Status: Within Functional Limits for tasks assessed Arousal/Alertness: Awake/alert Orientation Level: Oriented X4 Memory: Appears intact Awareness: Appears intact Problem Solving: Appears intact Safety/Judgment: Appears intact Sensation Sensation Light Touch: Appears Intact Hot/Cold: Appears Intact Proprioception: Appears Intact Stereognosis: Not tested Coordination Gross Motor Movements are Fluid and Coordinated: No Fine Motor Movements are Fluid and Coordinated: Yes Coordination and Movement Description: pain and global weakness limiting coordination Finger Nose Finger Test: slower LUE>RUE Heel Shin Test: slow on RLE, unable to lift LLE without assist Motor  Motor Motor: Within Functional Limits  Trunk/Postural Assessment  Cervical Assessment Cervical Assessment: Exceptions to [facility] (forward head) Thoracic Assessment Thoracic Assessment: Exceptions to [facility] (mild kyphsis) Lumbar Assessment Lumbar Assessment: Exceptions to [facility]  (posterior pelvic tilt) Postural Control Postural Control: Within Functional Limits  Balance Balance Balance Assessed: Yes Static Sitting Balance Static Sitting - Balance Support: Feet supported;Bilateral upper extremity supported Static Sitting - Level of Assistance: 5: Stand by assistance (supervision) Dynamic Sitting Balance Dynamic Sitting - Balance Support: Feet supported;No upper extremity supported Dynamic Sitting - Level of Assistance: 5: Stand by assistance (supervision) Static Standing Balance Static Standing - Balance Support: Bilateral upper extremity supported;During functional activity (RW) Static Standing - Level of Assistance: 5: Stand by assistance (CGA) Dynamic Standing Balance Dynamic Standing - Balance Support: Bilateral upper extremity supported;During functional activity (RW) Dynamic Standing - Level of Assistance: 4: Min assist Dynamic Standing - Comments: with transfers and gait Extremity Assessment  RLE Assessment RLE Assessment: Exceptions to [facility] General Strength Comments: tested sitting EOB RLE Strength Right Hip Flexion: 3+/5 Right Hip ABduction: 4-/5 Right Hip ADduction: 4-/5 Right Knee Flexion: 4-/5 Right Knee Extension: 4-/5 Right Ankle Dorsiflexion: 4/5 Right Ankle Plantar Flexion: 4/5 LLE Assessment LLE Assessment: Exceptions to [facility] General Strength Comments: tested sitting EOB LLE Strength Left Hip Flexion: 3-/5 Left Hip ABduction: 3+/5 Left Hip ADduction: 3+/5 Left Knee Flexion: 3+/5 Left Knee Extension: 3-/5 Left Ankle Dorsiflexion: 4-/5 Left Ankle Plantar Flexion: 4-/5  Care Tool Care Tool Bed Mobility Roll left and right activity   Roll left and right assist level: Supervision/Verbal cueing    Sit to lying activity        Lying to sitting on side of bed activity   Lying to sitting on side of bed assist level: the ability to move from lying on the back to sitting on the side of the bed with no back support.: Supervision/Verbal  cueing (HOB elevated and use of bedrails)     Care Tool Transfers Sit to stand transfer   Sit to stand assist level: Minimal Assistance - Patient > 75%    Chair/bed transfer         Scientist, research (physical sciences) transfer activity did not occur: Safety/medical concerns (  weakness/deconditioning, fatigue)        Care Tool Locomotion Ambulation   Assist level: Minimal Assistance - Patient > 75% Assistive device: Walker-rolling Max distance: 34ft  Walk 10 feet activity   Assist level: Minimal Assistance - Patient > 75% Assistive device: Walker-rolling   Walk 50 feet with 2 turns activity Walk 50 feet with 2 turns activity did not occur: Safety/medical concerns (weakness/deconditioning, fatigue)      Walk 150 feet activity Walk 150 feet activity did not occur: Safety/medical concerns (weakness/deconditioning, fatigue)      Walk 10 feet on uneven surfaces activity Walk 10 feet on uneven surfaces activity did not occur: Safety/medical concerns (weakness/deconditioning, fatigue)      Stairs Stair activity did not occur: Safety/medical concerns (weakness/deconditioning, fatigue)        Walk up/down 1 step activity Walk up/down 1 step or curb (drop down) activity did not occur: Safety/medical concerns (weakness/deconditioning, fatigue)      Walk up/down 4 steps activity Walk up/down 4 steps activity did not occur: Safety/medical concerns (weakness/deconditioning, fatigue)      Walk up/down 12 steps activity Walk up/down 12 steps activity did not occur: Safety/medical concerns (weakness/deconditioning, fatigue)      Pick up small objects from floor Pick up small object from the floor (from standing position) activity did not occur: Safety/medical concerns (weakness/deconditioning, fatigue)      Wheelchair Is the patient using a wheelchair?: Yes Type of Wheelchair: Manual   Wheelchair assist level: Dependent - Patient 0% Max wheelchair distance: 92ft  Wheel 50  feet with 2 turns activity Wheelchair 50 feet with 2 turns activity did not occur: Safety/medical concerns (weakness/deconditioning, fatigue)    Wheel 150 feet activity Wheelchair 150 feet activity did not occur: Safety/medical concerns (weakness/deconditioning, fatigue)      Refer to Care Plan for Long Term Goals  SHORT TERM GOAL WEEK 1 PT Short Term Goal 1 (Week 1): pt will transfer bed<>chair with LRAD and CGA PT Short Term Goal 2 (Week 1): pt will ambulate 36ft with LRAD and CGA PT Short Term Goal 3 (Week 1): pt will navigate 4 steps with 1 handrail and min A  Recommendations for other services: Therapeutic Recreation  Pet therapy  Skilled Therapeutic Intervention Evaluation completed (see details above and below) with education on PT POC and goals and individual treatment initiated with focus on functional mobility/transfers, generalized strengthening and endurance, dynamic standing balance/coordination, and ambulation. Received pt semi-reclined in bed on 2L O2 with SPO2 93% - remained on 2L throughout session. Pt educated on PT evaluation, CIR policies, and therapy schedule and agreeable. Pt denied any pain during session, just "stiffness" in L hip. Provided pt with 18x16 manual WC, L elevating legrest, RW, and Roho cushion. Pt transferred semi-reclined<>sitting EOB with HOB elevated and use of bedrails with supervision and increased time (pt reports her HOB at home elevates). Stood from EOB with RW and min A and ambulated 44ft with RW and min A with total A for WC and O2 tank management. Pt ambulates at decreased cadence, with flexed trunk/downward gaze, decreased bilateral foot clearance, and decreased weight shifting onto LLE. SPO2 dropped to 86% while ambulating, increasing to 93% with seated rest break and pursed lip breathing. Returned to room and concluded session with pt sitting in WC, needs within reach, and seatbelt alarm on. Safety plan updated and RN present at bedside.    Mobility Bed Mobility Bed Mobility: Rolling Right;Supine to Sit Rolling Right: Supervision/verbal cueing Supine to Sit: Supervision/Verbal cueing (  HOB elevated and use of bedrails) Transfers Transfers: Sit to Stand;Stand to Sit Sit to Stand: Minimal Assistance - Patient > 75% Stand to Sit: Contact Guard/Touching assist Transfer (Assistive device): Rolling walker Locomotion  Gait Ambulation: Yes Gait Assistance: Minimal Assistance - Patient > 75% Gait Distance (Feet): 21 Feet Assistive device: Rolling walker Gait Gait: Yes Gait Pattern: Impaired Gait Pattern: Step-to pattern;Decreased step length - right;Decreased step length - left;Decreased stride length;Antalgic;Poor foot clearance - left;Poor foot clearance - right;Narrow base of support Gait velocity: decreased Wheelchair Mobility Wheelchair Mobility: Yes Wheelchair Assistance: Dependent - Patient 0% Distance: 44ft   Discharge Criteria: Patient will be discharged from PT if patient refuses treatment 3 consecutive times without medical reason, if treatment goals not met, if there is a change in medical status, if patient makes no progress towards goals or if patient is discharged from hospital.  The above assessment, treatment plan, treatment alternatives and goals were discussed and mutually agreed upon: by patient  Huntley Dec PT, DPT 03/28/2023, 11:19 AM

## 2023-03-28 NOTE — Evaluation (Signed)
Occupational Therapy Assessment and Plan  Patient Details  Name: Michelle Vazquez MRN: [id number] Date of Birth: 11/19/1928  OT Diagnosis: acute pain, muscle weakness (generalized), and swelling of limb Rehab Potential: Rehab Potential (ACUTE ONLY): Good ELOS: 10-12 days   Today's Date: 03/28/2023 OT Individual Time: 6962-9528 OT Individual Time Calculation (min): 70 min     Hospital Problem: Principal Problem:   Closed left hip fracture, sequela   Past Medical History:  Past Medical History:  Diagnosis Date   Arthritis    Atrial fibrillation (HCC)    Bowel obstruction (HCC) 2000   Cataract    bilateral removed   Complication of anesthesia    hard to wake after hysterectomy, states heart stopped during a Esophagoscopy, but states she has had several surgeries and procedures with anesthesia and has never had that again   Dysrhythmia    HLD (hyperlipidemia)    Hypercontractile esophagus    Mitral valve prolapse    Osteoporosis    declines rx and declines DEXA f/u   Parathyroid adenoma    post parathyroid surgery in October of 2008   Paroxysmal supraventricular tachycardia    Pneumonia    as a child   Premature ventricular contractions (PVCs) (VPCs)    Schatzki's ring 08/2013   Dr. Rhea Belton, s/p dilation 08/2013, ?benefit   Squamous cell carcinoma in situ of skin    face   Urinary incontinence    Past Surgical History:  Past Surgical History:  Procedure Laterality Date   ABDOMINAL HYSTERECTOMY  1989   BREAST BIOPSY  1979   left   CATARACT EXTRACTION     bilateral   ESOPHAGEAL MANOMETRY N/A 11/17/2015   Procedure: ESOPHAGEAL MANOMETRY (EM);  Surgeon: Beverley Fiedler, MD;  Location: [facility];  Service: Gastroenterology;  Laterality: N/A;   ESOPHAGOSCOPY  09/17/2019   Procedure: Cervical Esophagoscopy;  Surgeon: Osborn Coho, MD;  Location: [facility] OR;  Service: ENT;;   EYE SURGERY     LAPAROSCOPIC SMALL BOWEL RESECTION  2000   LARYNGOSCOPY AND ESOPHAGOSCOPY N/A  10/21/2021   Procedure: DIRECT LARYNGOSCOPY AND ESOPHAGOSCOPY WITH DILATION OF ESOPHAGEAL NARROWING;  Surgeon: Osborn Coho, MD;  Location: [facility] OR;  Service: ENT;  Laterality: N/A;   LARYNGOSCOPY AND ESOPHAGOSCOPY N/A 07/26/2022   Procedure: DIRECT LARYNGOSCOPY; ESOPHAGOSCOPY AND ESOPHAGEAL DILATION;  Surgeon: Osborn Coho, MD;  Location: [facility] OR;  Service: ENT;  Laterality: N/A;   MICROLARYNGOSCOPY WITH DILATION N/A 09/17/2019   Procedure: MICROLARYNGOSCOPY WITH DILATION;  Surgeon: Osborn Coho, MD;  Location: [facility] OR;  Service: ENT;  Laterality: N/A;   parathyroid surgery  2008?   left   SHOULDER SURGERY  02/1999   right   SKIN LESION EXCISION  08/1999   left side of face   TOTAL HIP ARTHROPLASTY Left 03/23/2023   Procedure: TOTAL HIP ARTHROPLASTY ANTERIOR APPROACH;  Surgeon: Kathryne Hitch, MD;  Location: [facility];  Service: Orthopedics;  Laterality: Left;    Assessment & Plan Clinical Impression:  Michelle Vazquez is a 89 year old female with history of CAF/PSVT- on flecainide, hypercontractile esophagus, Schatzki's ring (Dr. Rhea Belton), CKD III, parathyroid adenoma, stress incontinence/nocturia;  who was admitted to [facility] on 03/21/23 after fall with onset left hip pain and inability to walk or bear weight on Left leg. She was found to have mildly elevated WBC-11.6 with  2D echo done showing EF 55-60% with no wall abnormality, severely dilated LA with grade 2 DD and myxomatous mitral valve.  She was found to have impacted  left femoral neck fracture and was underwent left anterior hip hemi by Dr. Magnus Ivan on 03/23/23. Post op WBAT and on low dose ASA bid for DVT prophylaxis. Post op on 05/18, she  hypotension with BP down to 70/52 with standing attempts and she was bolused with 1 Liter IVF. On 05/19, she developed acute hypoxic respiratory failure with congested cough and need for supplemental oxygen. CXR done showing new pleural effusion with LLL airspace disease. She was started on IV  unasyn due to concerns of PNA. Cough improving but continues to have throat discomfort affecting intake.   ABLA being monitored. BP improved and pain is controlled. PT/OT has been working with patient who is limited by hypoxia, balance deficits, debility and decreased balance. CIR recommended due to functional decline. Patient transferred to CIR on 03/27/2023 .    Patient currently requires mod with basic self-care skills secondary to muscle weakness, decreased cardiorespiratoy endurance and decreased oxygen support, decreased coordination, and decreased standing balance and decreased balance strategies.  Prior to hospitalization, patient could complete all self-care independently.  Patient will benefit from skilled intervention to decrease level of assist with basic self-care skills, increase independence with basic self-care skills, and increase level of independence with iADL prior to discharge home with care partner.  Anticipate patient will require intermittent supervision and follow up outpatient.  OT - End of Session Activity Tolerance: Tolerates < 10 min activity, no significant change in vital signs Endurance Deficit: Yes Endurance Deficit Description: required rest breaks throughout OT Assessment Rehab Potential (ACUTE ONLY): Good OT Barriers to Discharge: Home environment access/layout;New oxygen;Incontinence OT Patient demonstrates impairments in the following area(s): Balance;Edema;Endurance;Motor;Pain;Nutrition OT Basic ADL's Functional Problem(s): Bathing;Dressing;Toileting OT Advanced ADL's Functional Problem(s): Simple Meal Preparation OT Transfers Functional Problem(s): Toilet;Tub/Shower OT Additional Impairment(s): None OT Plan OT Intensity: Minimum of 1-2 x/day, 45 to 90 minutes OT Frequency: 5 out of 7 days OT Duration/Estimated Length of Stay: 10-12 days OT Treatment/Interventions: Balance/vestibular training;Discharge planning;Pain management;Self Care/advanced ADL  retraining;Therapeutic Activities;UE/LE Coordination activities;Disease mangement/prevention;Functional mobility training;Patient/family education;Therapeutic Exercise;DME/adaptive equipment instruction;Neuromuscular re-education;UE/LE Strength taining/ROM;Wheelchair propulsion/positioning OT Self Feeding Anticipated Outcome(s): Independent OT Basic Self-Care Anticipated Outcome(s): Mod I-supervision OT Toileting Anticipated Outcome(s): Mod I OT Bathroom Transfers Anticipated Outcome(s): Mod I OT Recommendation Recommendations for Other Services: Therapeutic Recreation consult Therapeutic Recreation Interventions: Pet therapy;Kitchen group;Stress management Patient destination: Home Follow Up Recommendations: Outpatient OT Equipment Recommended: To be determined   OT Evaluation Precautions/Restrictions  Precautions Precautions: Fall Precaution Comments: monitor O2 Restrictions Weight Bearing Restrictions: No LLE Weight Bearing: Weight bearing as tolerated Home Living/Prior Functioning Home Living Family/patient expects to be discharged to:: Unsure Living Arrangements: Other relatives Available Help at Discharge: Family, Available 24 hours/day (has 4 daughters that plan to rotate supervision/assist) Type of Home: House Home Access: Stairs to enter [employer] of Steps: 4 Entrance Stairs-Rails: Right, Left (2 railings at front entrance but too far apart to reach. Back entrance has 1 railing on L side (preferred entrance).) Home Layout: One level Bathroom Shower/Tub: Walk-in shower, Door (sliding door) Bathroom Toilet: Handicapped height Bathroom Accessibility: Yes  Lives With: Family (reports granddaughter (28 yo) moved in about a month ago for reasons unrelated to pt's independence. 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 of [employer] Prior Function Level of Independence: Independent with basic  ADLs, Independent with transfers, Independent with gait, Independent with homemaking with ambulation  Able to Take Stairs?: Yes Driving: Yes Vision Baseline Vision/History: 1 Wears glasses Ability to See in Adequate Light: 0 Adequate Patient Visual Report: No change from baseline Vision Assessment?: No apparent visual deficits Perception  Perception: Within Functional Limits Praxis Praxis: Intact Cognition Cognition Overall Cognitive Status: Within Functional Limits for tasks assessed Arousal/Alertness: Awake/alert Orientation Level: Person;Place;Situation Person: Oriented Place: Oriented Situation: Oriented Memory: Appears intact Awareness: Appears intact Problem Solving: Appears intact Safety/Judgment: Appears intact Brief Interview for Mental Status (BIMS) Repetition of Three Words (First Attempt): 3 Temporal Orientation: Year: Correct Temporal Orientation: Month: Accurate within 5 days Temporal Orientation: Day: Correct Recall: "Sock": Yes, no cue required Recall: "Blue": Yes, no cue required Recall: "Bed": Yes, no cue required BIMS Summary Score: 15 Sensation Sensation Light Touch: Appears Intact Hot/Cold: Appears Intact Proprioception: Appears Intact Stereognosis: Not tested Coordination Gross Motor Movements are Fluid and Coordinated: No Fine Motor Movements are Fluid and Coordinated: Yes Coordination and Movement Description: pain and global weakness limiting coordination Finger Nose Finger Test: slower LUE>RUE Heel Shin Test: slow on RLE, unable to lift LLE without assist Motor  Motor Motor: Within Functional Limits  Trunk/Postural Assessment  Cervical Assessment Cervical Assessment: Exceptions to [facility] (forward head) Thoracic Assessment Thoracic  Assessment: Exceptions to [facility] (mild kyphsis) Lumbar Assessment Lumbar Assessment: Exceptions to [facility] (posterior pelvic tilt) Postural Control Postural Control: Within Functional Limits  Balance Balance Balance Assessed: Yes Static Sitting Balance Static Sitting - Balance Support: Feet supported;Bilateral upper extremity supported Static Sitting - Level of Assistance: 5: Stand by assistance (supervision) Dynamic Sitting Balance Dynamic Sitting - Balance Support: Feet supported;No upper extremity supported Dynamic Sitting - Level of Assistance: 5: Stand by assistance (supervision) Static Standing Balance Static Standing - Balance Support: Bilateral upper extremity supported;During functional activity (RW) Static Standing - Level of Assistance: 5: Stand by assistance (CGA) Dynamic Standing Balance Dynamic Standing - Balance Support: Bilateral upper extremity supported;During functional activity (RW) Dynamic Standing - Level of Assistance: 4: Min assist Dynamic Standing - Comments: with transfers and gait Extremity/Trunk Assessment RUE Assessment RUE Assessment: Exceptions to [facility] General Strength Comments: 4-/5 grossly LUE Assessment LUE Assessment: Exceptions to [facility] General Strength Comments: 4-/5 grossly  Care Tool Care Tool Self Care Eating   Eating Assist Level: Set up assist    Oral Care    Oral Care Assist Level: Set up assist    Bathing   Body parts bathed by patient: Right arm;Left arm;Chest;Abdomen;Right upper leg;Left upper leg;Right lower leg;Left lower leg;Face Body parts bathed by helper: Front perineal area;Buttocks   Assist Level: Moderate Assistance - Patient 50 - 74%    Upper Body Dressing(including orthotics)   What is the patient wearing?: Pull over shirt   Assist Level: Supervision/Verbal cueing    Lower Body Dressing (excluding footwear)   What is the patient wearing?: Pants Assist for lower body dressing: Moderate Assistance - Patient 50 - 74%     Putting on/Taking off footwear   What is the patient wearing?: Non-skid slipper socks Assist for footwear: Dependent - Patient 0%       Care Tool Toileting Toileting activity   Assist for toileting: Moderate Assistance - Patient 50 - 74%     Care Tool Bed Mobility Roll left and right activity        Sit to lying activity        Lying to sitting on side of bed activity  Care Tool Transfers Sit to stand transfer   Sit to stand assist level: Minimal Assistance - Patient > 75%    Chair/bed transfer         Toilet transfer   Assist Level: Minimal Assistance - Patient > 75% (stand pivot)     Care Tool Cognition  Expression of Ideas and Wants Expression of Ideas and Wants: 4. Without difficulty (complex and basic) - expresses complex messages without difficulty and with speech that is clear and easy to understand  Understanding Verbal and Non-Verbal Content Understanding Verbal and Non-Verbal Content: 4. Understands (complex and basic) - clear comprehension without cues or repetitions   Memory/Recall Ability     Refer to Care Plan for Long Term Goals  SHORT TERM GOAL WEEK 1 OT Short Term Goal 1 (Week 1): Pt will complete sit > stand with CGA using LRAD in prep for ADL task OT Short Term Goal 2 (Week 1): Pt will complete toilet transfer with CGA using LRAD OT Short Term Goal 3 (Week 1): Pt will use AE PRN to thread LB clothing with supervision  Recommendations for other services: Therapeutic Recreation  Pet therapy, Kitchen group, and Stress management   Skilled Therapeutic Intervention Patient received seated in w/c upon therapy arrival and agreeable to participate in OT evaluation. No pain indicated at rest, however reported discomfort in throat with cough; pre-medicated and MD present for rounds to discuss. Education provided on OT purpose, therapy schedule, goals for therapy, and safety policy while in rehab. Patient demonstrates global/cardiorespiratory  endurance, standing balance, and coordination deficits resulting in difficulty completing BADL tasks without increased physical assist. Pt will benefit from skilled OT services to focus on mentioned deficits. See below for ADL and functional transfer performance. Pt was received on 1.5 L of O2 via [place]; did desat to 88% on RA during UB dressing/bathing and with activity however with 1 L O2 was able to rebound to 92% or greater therefore pt remained at conclusion of session. Pt remained seated in w/c at conclusion of session with chair alarm on and all needs met at end of session.   ADL ADL Eating: Set up Where Assessed-Eating: Wheelchair Grooming: Setup Where Assessed-Grooming: Wheelchair Upper Body Bathing: Supervision/safety Where Assessed-Upper Body Bathing: Sitting at sink Lower Body Bathing: Moderate assistance Where Assessed-Lower Body Bathing: Sitting at sink;Standing at sink (simulated) Upper Body Dressing: Supervision/safety Where Assessed-Upper Body Dressing: Wheelchair Lower Body Dressing: Moderate assistance Where Assessed-Lower Body Dressing: Sitting at sink;Standing at sink Toileting: Moderate assistance Where Assessed-Toileting: Wheelchair (simulated) Toilet Transfer: Minimal Dentist Method: Surveyor, minerals: Other (comment) (simulated to EOB with RW) Tub/Shower Transfer: Unable to assess Tub/Shower Transfer Method: Unable to assess Film/video editor: Unable to assess Film/video editor Method: Unable to assess Mobility  Bed Mobility Bed Mobility: Rolling Right;Supine to Sit Rolling Right: Supervision/verbal cueing Supine to Sit: Supervision/Verbal cueing (HOB elevated and use of bedrails) Transfers Sit to Stand: Minimal Assistance - Patient > 75% Stand to Sit: Contact Guard/Touching assist   Discharge Criteria: Patient will be discharged from OT if patient refuses treatment 3 consecutive times without medical reason,  if treatment goals not met, if there is a change in medical status, if patient makes no progress towards goals or if patient is discharged from hospital.  The above assessment, treatment plan, treatment alternatives and goals were discussed and mutually agreed upon: by patient  Melvyn Novas, MS, OTR/L  03/28/2023, 12:01 PM

## 2023-03-28 NOTE — Progress Notes (Signed)
Patient ID: Michelle Vazquez, female   DOB: 1929-08-03, 89 y.o.   MRN: [id number]  Team Conference Report to Patient/Family  Team Conference discussion was reviewed with the patient and caregiver, including goals, any changes in plan of care and target discharge date.  Patient and caregiver express understanding and are in agreement.  The patient has a target discharge date of 04/09/23.  SW met with patient, introduced self and explained role. Pt anticipates discharging home with assistance from brother and son. Pt has 4 steps to enter the home. Pt anticipated discharging home sooner, SW will reevaluate with next week and discuss with patient. No additional questions or concerns.   Andria Rhein 03/28/2023, 1:03 PM

## 2023-03-28 NOTE — Progress Notes (Signed)
Physical Therapy Session Note  Patient Details  Name: Michelle Vazquez MRN: [id number] Date of Birth: Jan 01, 1929  Today's Date: 03/28/2023 PT Individual Time: 4782-9562 PT Individual Time Calculation (min): 70 min   Short Term Goals: Week 1:  PT Short Term Goal 1 (Week 1): pt will transfer bed<>chair with LRAD and CGA PT Short Term Goal 2 (Week 1): pt will ambulate 36ft with LRAD and CGA PT Short Term Goal 3 (Week 1): pt will navigate 4 steps with 1 handrail and min A  Skilled Therapeutic Interventions/Progress Updates:  Received pt sitting in WC, pt agreeable to PT treatment, and reported soreness in L hip but did not rate pain severity. Pt also reported being "low on energy" compared to this morning but motivated to participate to her best ability. Pt on 1L O2 via [place] with SPO2 93% at rest. Session with emphasis on functional mobility/transfers, toileting, generalized strengthening and endurance, dynamic standing balance/coordination, simulated car transfers, and ambulation. Pt stood from [facility] with RW and min A and ambulated in/out of bathroom with RW and min A with increased time. Pt required mod A for clothing management (due to tight fitting pants) and found incontinent of urine - removed soiled brief and pt continued to void and with small BM. Donned clean brief with total A and stood from bedside commode with RW and min A and performed pericare standing with CGA - SPO2 95% after toileting. Pt transported to/from room in [facility] dependently for time management purposes. Pt performed simulated car transfer with RW and mod A (pt required assist to get LLE into car but able to get out of car using BUE support). Tried decreasing to RA when getting out of car however, SPO2 dropped to 84% and pt unable to recover - therefore increased to 1L and SPO2 increased to 94%. Transported to dayroom and performed seated BLE strengthening on [place] at 30 cm/sec for 1 minute increasing to 20 cm/sec for 1 minute x 3  additional trials with emphasis on glute/quad strength. Returned to room and concluded session with pt sitting in WC, needs within reach, and seatbelt alarm on. Pt left on 1L with SPO2 96% and RN present at bedside.   Therapy Documentation Precautions:  Restrictions Weight Bearing Restrictions: No LLE Weight Bearing: Weight bearing as tolerated  Therapy/Group: Individual Therapy [name] PT, DPT 03/28/2023, 7:10 AM

## 2023-03-28 NOTE — Discharge Instructions (Addendum)
Inpatient Rehab Discharge Instructions  [place] Discharge date and time:  04/08/23  Activities/Precautions/ Functional Status: Activity: no lifting, driving, or strenuous exercise till cleared by MD Diet:  as tolerated Wound Care: keep wound clean and dry. Contact Dr. Magnus Ivan if you develop any problems with your incision/wound--redness, swelling, increase in pain, drainage or if you develop fever or chills.    Functional status:  ___ No restrictions     ___ Walk up steps independently _X__ 24/7 supervision/assistance   ___ Walk up steps with assistance ___ Intermittent supervision/assistance  ___ Bathe/dress independently ___ Walk with walker     ___ Bathe/dress with assistance ___ Walk Independently    ___ Shower independently ___ Walk with assistance    _X__ Shower with assistance _X__ No alcohol     ___ Return to work/school ________  COMMUNITY REFERRALS UPON DISCHARGE:    Home Health:   PT     OT                      Agency: [facility]  Phone: [phone]    Medical Equipment/Items Ordered: Bedside Commode                                                 Agency/Supplier: [employer] [phone]    Special Instructions:    My questions have been answered and I understand these instructions. I will adhere to these goals and the provided educational materials after my discharge from the hospital.  Patient/Caregiver Signature _______________________________ Date __________  Clinician Signature _______________________________________ Date __________  Please bring this form and your medication list with you to all your follow-up doctor's appointments.

## 2023-03-28 NOTE — Progress Notes (Signed)
Inpatient Rehabilitation  Patient information reviewed and entered into eRehab system by Taijuan Serviss M. Aron Needles, M.A., CCC/SLP, PPS Coordinator.  Information including medical coding, functional ability and quality indicators will be reviewed and updated through discharge.    

## 2023-03-29 ENCOUNTER — Inpatient Hospital Stay (HOSPITAL_COMMUNITY): Payer: Medicare Other

## 2023-03-29 DIAGNOSIS — S72002S Fracture of unspecified part of neck of left femur, sequela: Secondary | ICD-10-CM | POA: Diagnosis not present

## 2023-03-29 MED ORDER — ACETAMINOPHEN 160 MG/5ML PO SOLN
650.0000 mg | Freq: Four times a day (QID) | ORAL | Status: DC | PRN
  Administered 2023-03-29 – 2023-04-02 (×5): 650 mg via ORAL
  Filled 2023-03-29 (×7): qty 20.3

## 2023-03-29 MED ORDER — POTASSIUM CHLORIDE 20 MEQ PO PACK
20.0000 meq | PACK | Freq: Once | ORAL | Status: AC
  Administered 2023-03-29: 20 meq via ORAL
  Filled 2023-03-29: qty 1

## 2023-03-29 MED ORDER — SODIUM CHLORIDE (PF) 0.9 % IJ SOLN
INTRAMUSCULAR | Status: AC
  Filled 2023-03-29: qty 10

## 2023-03-29 MED ORDER — CHOLECALCIFEROL 10 MCG/ML (400 UNIT/ML) PO LIQD
400.0000 [IU] | Freq: Every day | ORAL | Status: DC
  Administered 2023-03-29 – 2023-04-08 (×11): 400 [IU] via ORAL
  Filled 2023-03-29 (×12): qty 1

## 2023-03-29 MED ORDER — BOOST / RESOURCE BREEZE PO LIQD CUSTOM
1.0000 | Freq: Two times a day (BID) | ORAL | Status: DC
  Administered 2023-03-29 – 2023-03-30 (×2): 1 via ORAL

## 2023-03-29 MED ORDER — FERROUS SULFATE 220 (44 FE) MG/5ML PO SOLN
15.0000 mg | Freq: Every day | ORAL | Status: DC
  Administered 2023-03-30 – 2023-04-04 (×6): 15 mg via ORAL
  Filled 2023-03-29: qty 0.4
  Filled 2023-03-29: qty 0.34
  Filled 2023-03-29 (×3): qty 0.4
  Filled 2023-03-29: qty 0.34
  Filled 2023-03-29: qty 0.4

## 2023-03-29 MED ORDER — FERROUS SULFATE 220 (44 FE) MG/5ML PO SOLN
15.0000 mg | Freq: Every day | ORAL | Status: DC
  Filled 2023-03-29 (×2): qty 0.4

## 2023-03-29 MED ORDER — FERROUS SULFATE 75 (15 FE) MG/ML PO SOLN
15.0000 mg | Freq: Every day | ORAL | Status: DC
  Administered 2023-03-29: 15 mg via ORAL
  Filled 2023-03-29 (×2): qty 1

## 2023-03-29 MED ORDER — FUROSEMIDE 10 MG/ML IJ SOLN
20.0000 mg | Freq: Once | INTRAMUSCULAR | Status: AC
  Administered 2023-03-29: 20 mg via INTRAVENOUS
  Filled 2023-03-29: qty 2

## 2023-03-29 MED ORDER — PANTOPRAZOLE SODIUM 40 MG IV SOLR
40.0000 mg | Freq: Two times a day (BID) | INTRAVENOUS | Status: DC
  Administered 2023-03-29 – 2023-04-01 (×6): 40 mg via INTRAVENOUS
  Filled 2023-03-29 (×8): qty 10

## 2023-03-29 NOTE — Progress Notes (Signed)
Occupational Therapy Session Note  Patient Details  Name: Michelle Vazquez MRN: [id number] Date of Birth: 08-Jan-1929  Today's Date: 03/29/2023 OT Individual Time: 1302-1420 OT Individual Time Calculation (min): 78 min    Short Term Goals: Week 1:  OT Short Term Goal 1 (Week 1): Pt will complete sit > stand with CGA using LRAD in prep for ADL task OT Short Term Goal 2 (Week 1): Pt will complete toilet transfer with CGA using LRAD OT Short Term Goal 3 (Week 1): Pt will use AE PRN to thread LB clothing with supervision  Skilled Therapeutic Interventions/Progress Updates:    Patient agreeable to participate in OT session. Reports 0/10 pain level while seated in recliner.   Patient participated in skilled OT session focusing on ADL re-training, functional transfers, and increasing endurance.   Pt receiving lunch tray upon therapy arrival. Pt on RA with [name] off. SpO2 88% on RA. [place] placed with 1L and SpO2 increased to 90%. Nursing made aware. Pt completed self-feeding seated in recliner with Supervision. Pt demonstrates intermittent wet sounding coughing. Provided VC to clear throat, eat slow, and take smalls sips of liquid. Pt scheduled for swallow study tomorrow morning.   Once finished, pt completed functional transfer utilizing RW from recliner to w/c with Min guard. Pt reported muscle fatigue and leg soreness during mobility. Pain medication was required and provided by nursing.   Prior to starting UE strengthening exercises SpO2 was checked. Pt at 96% SpO2 on 1L O2.  Seated BUE strengthening completed to increase functional mobility while utilizing RW for balance.  - 2lb wrist weights, BUE, 2 sets, 10X, alternating punches, flexion (to 90*) and abduction (to 90*). VC provided although with visual demonstration for proper form and technique. Rest breaks taken when needed.  At end of exercises, pt's SpO2 reading: 95% on 1L O2.  Upon returning to room, pt completed stand pivot transfer  utilizing RW once more with SBA from w/c to recliner.    Therapy Documentation Precautions:  Precautions Precautions: Fall Precaution Comments: monitor O2 Restrictions Weight Bearing Restrictions: No LLE Weight Bearing: Weight bearing as tolerated  Therapy/Group: Individual Therapy  [name], OTR/L,CBIS  Supplemental OT - [facility] and [facility] Secure Chat Preferred   03/29/2023, 7:57 AM

## 2023-03-29 NOTE — Evaluation (Signed)
Recreational Therapy Assessment and Plan  Patient Details  Name: Michelle Vazquez MRN: [id number] Date of Birth: 1929-07-28 Today's Date: 03/29/2023  Rehab Potential:  Good ELOS:   d/c 6/3  Assessment  Hospital Problem: Principal Problem:   Closed left hip fracture, sequela     Past Medical History:      Past Medical History:  Diagnosis Date   Arthritis     Atrial fibrillation (HCC)     Bowel obstruction (HCC) 2000   Cataract      bilateral removed   Complication of anesthesia      hard to wake after hysterectomy, states heart stopped during a Esophagoscopy, but states she has had several surgeries and procedures with anesthesia and has never had that again   Dysrhythmia     HLD (hyperlipidemia)     Hypercontractile esophagus     Mitral valve prolapse     Osteoporosis      declines rx and declines DEXA f/u   Parathyroid adenoma      post parathyroid surgery in October of 2008   Paroxysmal supraventricular tachycardia     Pneumonia      as a child   Premature ventricular contractions (PVCs) (VPCs)     Schatzki's ring 08/2013    Dr. Rhea Belton, s/p dilation 08/2013, ?benefit   Squamous cell carcinoma in situ of skin      face   Urinary incontinence      Past Surgical History:       Past Surgical History:  Procedure Laterality Date   ABDOMINAL HYSTERECTOMY   1989   BREAST BIOPSY   1979    left   CATARACT EXTRACTION        bilateral   ESOPHAGEAL MANOMETRY N/A 11/17/2015    Procedure: ESOPHAGEAL MANOMETRY (EM);  Surgeon: Beverley Fiedler, MD;  Location: [facility];  Service: Gastroenterology;  Laterality: N/A;   ESOPHAGOSCOPY   09/17/2019    Procedure: Cervical Esophagoscopy;  Surgeon: Osborn Coho, MD;  Location: [facility] OR;  Service: ENT;;   EYE SURGERY       LAPAROSCOPIC SMALL BOWEL RESECTION   2000   LARYNGOSCOPY AND ESOPHAGOSCOPY N/A 10/21/2021    Procedure: DIRECT LARYNGOSCOPY AND ESOPHAGOSCOPY WITH DILATION OF ESOPHAGEAL NARROWING;  Surgeon: Osborn Coho, MD;   Location: [facility] OR;  Service: ENT;  Laterality: N/A;   LARYNGOSCOPY AND ESOPHAGOSCOPY N/A 07/26/2022    Procedure: DIRECT LARYNGOSCOPY; ESOPHAGOSCOPY AND ESOPHAGEAL DILATION;  Surgeon: Osborn Coho, MD;  Location: [facility] OR;  Service: ENT;  Laterality: N/A;   MICROLARYNGOSCOPY WITH DILATION N/A 09/17/2019    Procedure: MICROLARYNGOSCOPY WITH DILATION;  Surgeon: Osborn Coho, MD;  Location: [facility] OR;  Service: ENT;  Laterality: N/A;   parathyroid surgery   2008?    left   SHOULDER SURGERY   02/1999    right   SKIN LESION EXCISION   08/1999    left side of face   TOTAL HIP ARTHROPLASTY Left 03/23/2023    Procedure: TOTAL HIP ARTHROPLASTY ANTERIOR APPROACH;  Surgeon: Kathryne Hitch, MD;  Location: [facility];  Service: Orthopedics;  Laterality: Left;      Assessment & Plan Clinical Impression: Patient is a 89 y.o. year old female with history of CAF/PSVT- on flecainide, hypercontractile esophagus, Schatzki's ring (Dr. Rhea Belton), CKD III, parathyroid adenoma, stress incontinence/nocturia;  who was admitted to [facility] on 03/21/23 after fall with onset left hip pain and inability to walk or bear weight on Left leg. She was found to have mildly elevated WBC-11.6  with  2D echo done showing EF 55-60% with no wall abnormality, severely dilated LA with grade 2 DD and myxomatous mitral valve.  She was found to have impacted left femoral neck fracture and was underwent left anterior hip hemi by Dr. Magnus Ivan on 03/23/23. Post op WBAT and on low dose ASA bid for DVT prophylaxis. Post op on 05/18, she  hypotension with BP down to 70/52 with standing attempts and she was bolused with 1 Liter IVF. On 05/19, she developed acute hypoxic respiratory failure with congested cough and need for supplemental oxygen. CXR done showing new pleural effusion with LLL airspace disease. She was started on IV unasyn due to concerns of PNA. Cough improving but continues to have throat discomfort affecting intake.   ABLA being monitored.  BP improved and pain is controlled. PT/OT has been working with patient who is limited by hypoxia, balance deficits, debility and decreased balance. CIR recommended due to functional decline.    Pt reports LBM yesterday- first day since surgery had BM. Has a cough whenever tries to swallow fluids, even when we tried nectar thick liquids when doing admission. On 2L O2- breathing well unless drinks.  Pain moderate, but usually only taking tylenol- added Norco, per granddaughter.  Says lives on edge of dehydration due to difficulty swallowing fluids. Milk is easiest to swallow.  Cannot lay flat, due to choking feeling.   Pt presents with decreased activity tolerance, decreased functional mobility, decreased balance, decreased oxygen support Limiting pt's independence with leisure/community pursuits.  Met with pt today to discuss TR services including leisure education, activity analysis/modifications and stress management.  Also discussed the importance of social, emotional, spiritual health in addition to physical health and their effects on overall health and wellness.  Pt stated understanding.  Plan  Min 1 TR session >20 minutes during LOS  Recommendations for other services: None   Discharge Criteria: Patient will be discharged from TR if patient refuses treatment 3 consecutive times without medical reason.  If treatment goals not met, if there is a change in medical status, if patient makes no progress towards goals or if patient is discharged from hospital.  The above assessment, treatment plan, treatment alternatives and goals were discussed and mutually agreed upon: by patient  Admiral Marcucci 03/29/2023, 3:51 PM

## 2023-03-29 NOTE — Plan of Care (Signed)
  Problem: RH Swallowing Goal: LTG Patient will consume least restrictive diet using compensatory strategies with assistance (SLP) Description: LTG:  Patient will consume least restrictive diet using compensatory strategies with assistance (SLP) Flowsheets (Taken 03/29/2023 1324) LTG: Pt Patient will consume least restrictive diet using compensatory strategies with assistance of (SLP): Modified Independent

## 2023-03-29 NOTE — Progress Notes (Signed)
PROGRESS NOTE   Subjective/Complaints: Continues to have difficulty swallowing and cough, SLP to see her today and consulted GI regarding her esophageal strictures.    ROS +cough, +difficulty swallowing   Objective:   DG Chest 2 View  Result Date: 03/29/2023 CLINICAL DATA:  Cough EXAM: CHEST - 2 VIEW COMPARISON:  CXR 03/27/23 FINDINGS: Unchanged small left pleural effusion. Unchanged cardiac and mediastinal contours. Unchanged consolidative opacity at the left lung base which could represent atelectasis or infection. Prominent bilateral interstitial opacities are favored to represent pulmonary venous congestion. No radiographically apparent displaced rib fractures. Vertebral body heights are maintained. Visualized upper abdomen is unremarkable. Vertebral body heights are maintained. IMPRESSION: No significant change compared to recent prior chest radiograph dated 03/27/2023 with a small left pleural effusion and a consolidative left basilar opacity. Electronically Signed   By: Lorenza Cambridge M.D.   On: 03/29/2023 11:13   DG Chest 2 View  Result Date: 03/27/2023 CLINICAL DATA:  [id number] Follow-up exam [id number] EXAM: CHEST - 2 VIEW COMPARISON:  03/25/2023 FINDINGS: Persistent airspace consolidation medially at the left lung base. Adjacent left pleural effusion. Right lung clear. Heart size and mediastinal contours are within normal limits. Visualized bones unremarkable. IMPRESSION: Persistent left lower lobe consolidation and effusion. Electronically Signed   By: Corlis Leak M.D.   On: 03/27/2023 15:29   Recent Labs    03/27/23 0336 03/28/23 0608  WBC 7.1 6.1  HGB 9.1* 9.3*  HCT 28.1* 28.0*  PLT 182 204   Recent Labs    03/27/23 0336 03/28/23 0608  NA 136 137  K 3.7 3.7  CL 105 105  CO2 26 23  GLUCOSE 109* 94  BUN 21 17  CREATININE 0.75 0.72  CALCIUM 7.6* 7.8*    Intake/Output Summary (Last 24 hours) at 03/29/2023 1152 Last  data filed at 03/29/2023 0800 Gross per 24 hour  Intake 914.89 ml  Output 150 ml  Net 764.89 ml        Physical Exam: Vital Signs Blood pressure 137/64, pulse 87, temperature 98.6 F (37 C), temperature source Oral, resp. rate 16, height 5\' 1"  (1.549 m), weight 43.9 kg, SpO2 90 %. Constitutional:      Appearance: Normal appearance. She is normal weight. BMI 18.29    Interventions: Nasal cannula in place.     Comments: Pt awake, alert, appropriate, granddaughter at bedside; pt on 2L O2 by [place], appears younger than stated age, NAD, sitting up in bedside chair  HENT:     Head: Normocephalic and atraumatic.     Right Ear: External ear normal.     Left Ear: External ear normal.     Nose: Nose normal. No congestion.     Mouth/Throat:     Mouth: Mucous membranes are dry.     Pharynx: Oropharynx is clear. No oropharyngeal exudate.  Eyes:     General:        Right eye: No discharge.        Left eye: No discharge.     Extraocular Movements: Extraocular movements intact.  Cardiovascular:     Rate and Rhythm: Normal rate. Rhythm irregular.     Heart sounds: Normal heart sounds. No  murmur heard.    No gallop.  Pulmonary:     Comments: Decreased at bases. Intermittent cough with dysphonia.  Coughed more when attempted nectar thick liquids.  Abdominal:     General: Abdomen is flat. Bowel sounds are normal. There is no distension.     Palpations: Abdomen is soft.     Tenderness: There is no abdominal tenderness.  Musculoskeletal:     Cervical back: Neck supple. No tenderness.     Comments: Left hip incision with surgical foam dressing. Min edema left hip and thigh. Linear ecchymotic area upper right thigh.    UE strength 5-/5 throughout B/L RLE 5-/5 and LLE 2/5 in HF, otherwise 5-/5  CG-MinA for transfers Skin:    General: Skin is warm and dry.  Neurological:     Mental Status: She is alert and oriented to person, place, and time.     Comments: Intact to light touch in all  4  extremities  Psychiatric:        Mood and Affect: Mood normal.        Behavior: Behavior normal.    Assessment/Plan: 1. Functional deficits which require 3+ hours per day of interdisciplinary therapy in a comprehensive inpatient rehab setting. Physiatrist is providing close team supervision and 24 hour management of active medical problems listed below. Physiatrist and rehab team continue to assess barriers to discharge/monitor patient progress toward functional and medical goals  Care Tool:  Bathing    Body parts bathed by patient: Right arm, Left arm, Chest, Abdomen, Right upper leg, Left upper leg, Right lower leg, Left lower leg, Face   Body parts bathed by helper: Front perineal area, Buttocks     Bathing assist Assist Level: Moderate Assistance - Patient 50 - 74%     Upper Body Dressing/Undressing Upper body dressing   What is the patient wearing?: Pull over shirt    Upper body assist Assist Level: Supervision/Verbal cueing    Lower Body Dressing/Undressing Lower body dressing      What is the patient wearing?: Pants     Lower body assist Assist for lower body dressing: Moderate Assistance - Patient 50 - 74%     Toileting Toileting    Toileting assist Assist for toileting: Moderate Assistance - Patient 50 - 74%     Transfers Chair/bed transfer  Transfers assist     Chair/bed transfer assist level: Maximal Assistance - Patient 25 - 49%     Locomotion Ambulation   Ambulation assist      Assist level: Minimal Assistance - Patient > 75% Assistive device: Walker-rolling Max distance: 23ft   Walk 10 feet activity   Assist     Assist level: Minimal Assistance - Patient > 75% Assistive device: Walker-rolling   Walk 50 feet activity   Assist Walk 50 feet with 2 turns activity did not occur: Safety/medical concerns (weakness/deconditioning, fatigue)         Walk 150 feet activity   Assist Walk 150 feet activity did not occur:  Safety/medical concerns (weakness/deconditioning, fatigue)         Walk 10 feet on uneven surface  activity   Assist Walk 10 feet on uneven surfaces activity did not occur: Safety/medical concerns (weakness/deconditioning, fatigue)         Wheelchair     Assist Is the patient using a wheelchair?: Yes Type of Wheelchair: Manual    Wheelchair assist level: Dependent - Patient 0% Max wheelchair distance: 66ft    Wheelchair 50 feet with 2  turns activity    Assist    Wheelchair 50 feet with 2 turns activity did not occur: Safety/medical concerns (weakness/deconditioning, fatigue)       Wheelchair 150 feet activity     Assist  Wheelchair 150 feet activity did not occur: Safety/medical concerns (weakness/deconditioning, fatigue)       Blood pressure 137/64, pulse 87, temperature 98.6 F (37 C), temperature source Oral, resp. rate 16, height 5\' 1"  (1.549 m), weight 43.9 kg, SpO2 90 %.    Medical Problem List and Plan: 1. Functional deficits secondary to L femoral neck fx s/p hemiarthroplasty             -patient may  shower with covering incision             -ELOS/Goals: 10-12 days mod I to supervision  Grounds pass ordered  [employer] today   2.  Antithrombotics: -DVT/anticoagulation:  SCDs/TEDs/ASA              -antiplatelet therapy: ASA 81 mg po BID 3. Pain Management: Hydrocodone prn vs tylenol prn.  4. Mood/Behavior/Sleep: LCSW to follow for evaluation and support.              -antipsychotic agents: N/A 5. Neuropsych/cognition: This patient is capable of making decisions on her own behalf. 6. Skin/Wound Care: Routine pressure relief measures.  7. Fluids/Electrolytes/Nutrition:  Monitor I/O. Check CMET in am. 8. Left hip subcapital femoral neck  fx s/p anterior hemi: WBAT 9. Aspiration PNA: Continues to have frequent cough-->will check f/u CXR to rule out underlying overload.             --Continue Unasyn D# 3/5-7?              -con't 2L O2 by [place]- wean as tolerated 10. Severe GERD: On Protonix IV BID-->will change to oral route 11. Chronic dysphagia/Esophageal stricture: Is able to modify diet             --last swallow study without evidence of aspiration and no change in stricture for past 4 years             --does not like Ensure--make her own shakes at home. Will try [employer] supplement. Pt CAN use nectar thick liquids if she wants, however can stay on thin liquids.   12. CAF: Monitor HR TID--continue Tambocor 13. ABLA: Recheck CBC in am. 14. Fluid overload: Monitor weight daily. Encourage low salt diet. Current weight reviewed and is 18.29  15. Underweight: provided dietary education  16. Difficulty swallowing 2/2 chronic esophageal stricture: SLP ordered, GI consulted  17. Small left pleural effusion: 20mg  IV Lasix ordered  18. Suboptimal vitamin D: liquid vitamin D ordered   19. Reduced TIBC: daily iron supplement ordered  >50 minutes spent in review of chart and CXR result, consultation of GI regarding patient's difficulty swallowing/esophageal stricture, labs reviewed and vitamin D and iron supplements ordered  LOS: 2 days A FACE TO FACE EVALUATION WAS PERFORMED  Clint Bolder P Alyzah Pelly 03/29/2023, 11:52 AM

## 2023-03-29 NOTE — Plan of Care (Signed)
  Problem: RH BLADDER ELIMINATION Goal: RH STG MANAGE BLADDER WITH ASSISTANCE Description: STG Manage Bladder With toileting Assistance Outcome: Not Progressing; stress incontinence; coughing

## 2023-03-29 NOTE — Evaluation (Signed)
Speech Language Pathology Bedside Swallow Evaluation   Patient Details  Name: Michelle Vazquez MRN: [id number] Date of Birth: [birth year]  SLP Diagnosis: Dysphagia  Rehab Potential: Fair ELOS: 04/09/23    Today's Date: 03/29/2023 SLP Individual Time: 1020-1100 SLP Individual Time Calculation (min): 40 min   Hospital Problem: Principal Problem:   Closed left hip fracture, sequela  Past Medical History:  Past Medical History:  Diagnosis Date   Arthritis    Atrial fibrillation (HCC)    Bowel obstruction (HCC) 2000   Cataract    bilateral removed   Complication of anesthesia    hard to wake after hysterectomy, states heart stopped during a Esophagoscopy, but states she has had several surgeries and procedures with anesthesia and has never had that again   Dysrhythmia    HLD (hyperlipidemia)    Hypercontractile esophagus    Mitral valve prolapse    Osteoporosis    declines rx and declines DEXA f/u   Parathyroid adenoma    post parathyroid surgery in October of 2008   Paroxysmal supraventricular tachycardia    Pneumonia    as a child   Premature ventricular contractions (PVCs) (VPCs)    Schatzki's ring 08/2013   Dr. Rhea Belton, s/p dilation 08/2013, ?benefit   Squamous cell carcinoma in situ of skin    face   Urinary incontinence    Past Surgical History:  Past Surgical History:  Procedure Laterality Date   ABDOMINAL HYSTERECTOMY  1989   BREAST BIOPSY  1979   left   CATARACT EXTRACTION     bilateral   ESOPHAGEAL MANOMETRY N/A 11/17/2015   Procedure: ESOPHAGEAL MANOMETRY (EM);  Surgeon: Beverley Fiedler, MD;  Location: [facility];  Service: Gastroenterology;  Laterality: N/A;   ESOPHAGOSCOPY  09/17/2019   Procedure: Cervical Esophagoscopy;  Surgeon: Osborn Coho, MD;  Location: [facility] OR;  Service: ENT;;   EYE SURGERY     LAPAROSCOPIC SMALL BOWEL RESECTION  2000   LARYNGOSCOPY AND ESOPHAGOSCOPY N/A 10/21/2021   Procedure: DIRECT LARYNGOSCOPY AND ESOPHAGOSCOPY WITH DILATION OF  ESOPHAGEAL NARROWING;  Surgeon: Osborn Coho, MD;  Location: [facility] OR;  Service: ENT;  Laterality: N/A;   LARYNGOSCOPY AND ESOPHAGOSCOPY N/A 07/26/2022   Procedure: DIRECT LARYNGOSCOPY; ESOPHAGOSCOPY AND ESOPHAGEAL DILATION;  Surgeon: Osborn Coho, MD;  Location: [facility] OR;  Service: ENT;  Laterality: N/A;   MICROLARYNGOSCOPY WITH DILATION N/A 09/17/2019   Procedure: MICROLARYNGOSCOPY WITH DILATION;  Surgeon: Osborn Coho, MD;  Location: [facility] OR;  Service: ENT;  Laterality: N/A;   parathyroid surgery  2008?   left   SHOULDER SURGERY  02/1999   right   SKIN LESION EXCISION  08/1999   left side of face   TOTAL HIP ARTHROPLASTY Left 03/23/2023   Procedure: TOTAL HIP ARTHROPLASTY ANTERIOR APPROACH;  Surgeon: Kathryne Hitch, MD;  Location: [facility] ORS;  Service: Orthopedics;  Laterality: Left;    Assessment / Plan / Recommendation Clinical Impression Patient is a 89 y.o. female with past medical history significant for osteoarthritis, chronic atrial fibrillation, paroxysmal SVT, history of bowel obstruction, bilateral cataracts, bilateral cataract surgery, hyperlipidemia, hypercontractile esophagus, mitral valve prolapse, osteoporosis, parathyroid adenoma, history of pneumonia as a child, face squamous cell CA, and urinary incontinence  who presented to [facility] ED on 03/21/23 after mechanical fall on the left hip. She developed immediate pain and inability to bear weight on her  left hip.  Left hip x-ray showed a subcapital impaction fracture of the left femoral neck. Patient underwent left direct anterior hip hemiarthroplasty  with orthopedic surgery on 03/23/23. Post op, patient developed respiratory failure with hypoxia,. Chest x tray showed a new pleural effusion. There was also concern for aspiration and patient was started on IV Unasyn for PNA. Therapy evaluations completed with recommendations for CIR. Patient admitted 03/27/23. SLP consulted on 03/29/23 due to difficulty swallowing  with overt s/s of aspiration with liquids.   Patient has a long history of an esophageal dysphagia with patient reporting she tends to eat more soups, pureed textures, and homemade smoothies/milkshakes as they feel easier to swallow which helps maximize her overall PO intake.   Patient has had multiple dilations (last dilation 9/23) with minimal success. Patient's latest esophagram (4/24) revealed a "High-grade stricture of the cervical esophagus and associated pyriform sinus dilation. Large-volume spontaneous gastroesophageal reflux was also observed." Patient is now deconditioned since fall with resulting hip fx and repair with a new left lower opacity requiring patient to now require supplemental oxygen. Both the patient and staff report consistent coughing with all PO intake. Patient observed with both thin liquids and pureed textures at the bedside. Patient demonstrated prolonged oral prep and AP transit with utilization of multiple swallows. Patient consistently coughed and orally expectorating some residuals after each bite/sip.   Patient feels that consistent coughing is due to PNA with SLP providing education that while PNA may be exacerbating coughing, her coughing with food/liquids appears more consistent with decreased airway protection due to decreased functional reserve/deconditioning. Recommend MBS to assess swallow function and for safest diet recommendation. Educated patient regarding the importance of oral care, only eating/drinking while upright in a chair, remaining upright 30-45 minutes after meals, and mobilizing as much as possible throughout the day. SLP also downgraded diet to Dys. 1 textures with thin liquids as these are the types of food she is consistently ordering on her tray. Patient verbalized understanding and agreement.   Patient would benefit from skilled SLP intervention for ongoing assessment of swallowing function, diet recommendations and utilization of strategies.      Skilled Therapeutic Interventions          Administered a BSE, please see above for details   SLP Assessment  Patient will need skilled Speech Lanaguage Pathology Services during CIR admission    Recommendations  SLP Diet Recommendations: Dysphagia 1 (Puree);Thin Liquid Administration via: Cup;Straw Medication Administration:  (cut up into several pieces and taken with milk (patient's preference)) Supervision: Patient able to self feed Compensations: Minimize environmental distractions;Slow rate;Small sips/bites;Multiple dry swallows after each bite/sip;Follow solids with liquid Postural Changes and/or Swallow Maneuvers: Out of bed for meals;Upright 30-60 min after meal Oral Care Recommendations: Oral care QID Patient destination: Home Follow up Recommendations: None Equipment Recommended: None recommended by SLP    SLP Frequency 1 to 3 out of 7 days   SLP Duration  SLP Intensity  SLP Treatment/Interventions 04/09/23  Minumum of 1-2 x/day, 30 to 90 minutes  Cueing hierarchy;Dysphagia/aspiration precaution training;Internal/external aids;Therapeutic Activities;Patient/family education    Pain No/Denies Pain    Care Tool Care Tool Cognition Ability to hear (with hearing aid or hearing appliances if normally used Ability to hear (with hearing aid or hearing appliances if normally used): 0. Adequate - no difficulty in normal conservation, social interaction, listening to TV   Expression of Ideas and Wants Expression of Ideas and Wants: 4. Without difficulty (complex and basic) - expresses complex messages without difficulty and with speech that is clear and easy to understand   Understanding Verbal and Non-Verbal Content Understanding Verbal and Non-Verbal Content:  4. Understands (complex and basic) - clear comprehension without cues or repetitions  Memory/Recall Ability Memory/Recall Ability : Current season;That he or she is in a hospital/hospital unit   Bedside Swallowing  Assessment General Date of Onset: 03/21/23 Previous Swallow Assessment: N/A Diet Prior to this Study: Dysphagia 3 (mechanical soft);Thin liquids (Level 0) Temperature Spikes Noted: No Respiratory Status: Supplemental O2 delivered via (comment) History of Recent Intubation: Yes (for surgery) Behavior/Cognition: Alert;Cooperative;Pleasant mood Oral Cavity - Dentition: Adequate natural dentition Self-Feeding Abilities: Able to feed self Patient Positioning: Upright in chair/Tumbleform Baseline Vocal Quality: Normal Volitional Cough: Strong Volitional Swallow: Able to elicit  Ice Chips Ice chips: Not tested Thin Liquid Thin Liquid: Impaired Presentation: Self Fed;Straw Oral Phase Functional Implications: Prolonged oral transit Pharyngeal  Phase Impairments: Multiple swallows;Cough - Immediate Other Comments: oral expectoration Nectar Thick Nectar Thick Liquid: Not tested Other Comments: declined any thicker liquids at this time as she reports they create more residuals Honey Thick Honey Thick Liquid: Not tested Puree Puree: Impaired Presentation: Self Fed;Spoon (1/2 tsp is as much as she usually eats per bite per her report) Oral Phase Functional Implications: Prolonged oral transit Pharyngeal Phase Impairments: Multiple swallows;Cough - Immediate Other Comments: oral expectoration Solid Solid: Not tested BSE Assessment Risk for Aspiration Impact on safety and function: Moderate aspiration risk Other Related Risk Factors: History of GERD;History of esophageal-related issues;Deconditioning  Short Term Goals: Week 1: SLP Short Term Goal 1 (Week 1): Patient will consume current diet with Mod I for use of swallowing compensatory strategies.  Refer to Care Plan for Long Term Goals  Recommendations for other services: None   Discharge Criteria: Patient will be discharged from SLP if patient refuses treatment 3 consecutive times without medical reason, if treatment goals not met,  if there is a change in medical status, if patient makes no progress towards goals or if patient is discharged from hospital.  The above assessment, treatment plan, treatment alternatives and goals were discussed and mutually agreed upon: by patient  Luisfernando Brightwell 03/29/2023, 1:20 PM

## 2023-03-29 NOTE — Progress Notes (Signed)
Initial Nutrition Assessment  DOCUMENTATION CODES:   Not applicable  INTERVENTION:  - Add Icecream with meals.   NUTRITION DIAGNOSIS:   Increased nutrient needs related to hip fracture as evidenced by estimated needs.  GOAL:   Patient will meet greater than or equal to 90% of their needs  MONITOR:   PO intake  REASON FOR ASSESSMENT:   Malnutrition Screening Tool    ASSESSMENT:   89 y.o. female admits to CIR related to functional deficits due to L hip fracture. PMH includes: arthritis, afib, bowel obstruction, HLD, osteoporosis.  Meds reviewed:  colace. Labs reviewed: WDL.   Pt was eating her lunch at time of assessment. The pt reports that she has a fair appetite but due to esophageal stricture she has difficulty swallowing. Pt reports that she does not like Mining engineer or Boost Breeze supplements. RD will discontinue supplements and just add icecream instead with lunch and dinner. Will continue to monitor PO intakes. No significant wt loss per record.   NUTRITION - FOCUSED PHYSICAL EXAM:  Flowsheet Row Most Recent Value  Orbital Region Moderate depletion  Upper Arm Region Moderate depletion  Thoracic and Lumbar Region Unable to assess  Buccal Region Moderate depletion  Temple Region Severe depletion  Clavicle Bone Region Severe depletion  Clavicle and Acromion Bone Region Unable to assess  Scapular Bone Region Unable to assess  Dorsal Hand Mild depletion  Patellar Region Moderate depletion  Anterior Thigh Region Moderate depletion  Posterior Calf Region Moderate depletion  Edema (RD Assessment) None  Hair Reviewed  Eyes Reviewed  Mouth Reviewed  Skin Reviewed  Nails Reviewed        Diet Order:   Diet Order             DIET - DYS 1 Room service appropriate? Yes; Fluid consistency: Thin  Diet effective now                   EDUCATION NEEDS:   Not appropriate for education at this time  Skin:  Skin Assessment: Skin Integrity Issues: Skin  Integrity Issues:: Incisions Incisions: L hip  Last BM:  5/24 - type 4  Height:   Ht Readings from Last 1 Encounters:  03/27/23 5\' 1"  (1.549 m)    Weight:   Wt Readings from Last 1 Encounters:  03/30/23 44 kg    Ideal Body Weight:     BMI:  Body mass index is 18.33 kg/m.  Estimated Nutritional Needs:   Kcal:  1500-1650 kcals  Protein:  55-70 gm  Fluid:  >/= 1.5 L  Bethann Humble, RD, LDN, CNSC.

## 2023-03-29 NOTE — Progress Notes (Signed)
Physical Therapy Session Note  Patient Details  Name: Michelle Vazquez MRN: [id number] Date of Birth: 14-May-1929  Today's Date: 03/29/2023 PT Individual Time: 1101-1157 PT Individual Time Calculation (min): 56 min   Short Term Goals: Week 1:  PT Short Term Goal 1 (Week 1): pt will transfer bed<>chair with LRAD and CGA PT Short Term Goal 2 (Week 1): pt will ambulate 53ft with LRAD and CGA PT Short Term Goal 3 (Week 1): pt will navigate 4 steps with 1 handrail and min A  Skilled Therapeutic Interventions/Progress Updates:   Received pt sitting in [facility] - handoff from SLP. Pt agreeable to PT treatment and reported mild soreness in L hip. Session with emphasis on functional mobility/transfers, generalized strengthening and endurance, stair navigation, and gait training. Pt on 2L with SPO2 96% - decreased to 1L and SPO2 93%. Recreational therapist present during session. Pt transported to/from room in [facility] dependently for time management purposes. Pt stood at staircase with min A and navigated 4 6in steps with L handrail using lateral stepping technique with heavy min/light mod A with cues for stepping sequencing - SPO2 89% increasing to 96% with seated rest break. Stood from [facility] with RW and CGA and ambulated 55ft with RW and CGA with assist for WC follow and O2 tank management - limited gait distance due to pulse ox reading 80% (inaccurate) but upon sitting, dynomat reading 93%. Pt performed the following seated exercises with emphasis on LE strength/ROM on RA with SPO2 >91%: -LAQ 2x12 on RLE with 2.5lb ankle weight and 2x10 unweighted on LLE -hip flexion 2x10 on RLE with 2.5lb ankle weight and 2x5 unweighted on LLE (noted decreased ROM) Returned to room and concluded session with pt sitting in WC, needs within reach, and chair pad alarm on. Pt left on RA with SPO2 93-96% and RN notified.   Therapy Documentation Precautions:  Precautions Precautions: Fall Precaution Comments: monitor  O2 Restrictions Weight Bearing Restrictions: No LLE Weight Bearing: Weight bearing as tolerated  Therapy/Group: Individual Therapy [name] PT, DPT 03/29/2023, 7:04 AM

## 2023-03-29 NOTE — Progress Notes (Addendum)
Discussed patient with GI PA (who conferred with Dr. Cherrie Gauze has chronic stricture, dysphagia and  nutcracker esophagus. They do not have any options and no plans for EGD unless modified barium swallow shows something new. ST to follow up on study results.

## 2023-03-29 NOTE — Progress Notes (Signed)
Physical Therapy Session Note  Patient Details  Name: Michelle Vazquez MRN: [id number] Date of Birth: [birth year]  Today's Date: 03/29/2023 PT Individual Time: 0915-0940 PT Individual Time Calculation (min): 25 min   Short Term Goals: Week 1:  PT Short Term Goal 1 (Week 1): pt will transfer bed<>chair with LRAD and CGA PT Short Term Goal 2 (Week 1): pt will ambulate 61ft with LRAD and CGA PT Short Term Goal 3 (Week 1): pt will navigate 4 steps with 1 handrail and min A  Skilled Therapeutic Interventions/Progress Updates:      Therapy Documentation Precautions:  Precautions Precautions: Fall Precaution Comments: monitor O2 Restrictions Weight Bearing Restrictions: No LLE Weight Bearing: Weight bearing as tolerated   Pt received seated in recliner at bedside on room air with PA and MD present for morning rounds. Pt without reports of pain in session and states L LE is not as bothersome as her throat. PT assessed SPO2 on room air and pt with saturations fluctuating from 86 to 94, PT titrated pt to 2 L of O2 in session to keep O2 saturations >90 consistently. PT educated pt on pursed lip breathing and benefits of acapella and pt able to demonstrate appropriately. Pt requires min A with sit to stand with RW and CGA with stand pivot to bed. Pt min A for LE management with sit to lying and pt left in care of nurse tech and X-ray transporter.    Therapy/Group: Individual Therapy  Truitt Leep Truitt Leep PT, DPT  03/29/2023, 7:21 AM

## 2023-03-30 ENCOUNTER — Inpatient Hospital Stay (HOSPITAL_COMMUNITY): Payer: Medicare Other

## 2023-03-30 DIAGNOSIS — G4700 Insomnia, unspecified: Secondary | ICD-10-CM | POA: Diagnosis not present

## 2023-03-30 DIAGNOSIS — R1319 Other dysphagia: Secondary | ICD-10-CM | POA: Diagnosis not present

## 2023-03-30 DIAGNOSIS — S72002S Fracture of unspecified part of neck of left femur, sequela: Secondary | ICD-10-CM | POA: Diagnosis not present

## 2023-03-30 DIAGNOSIS — E8770 Fluid overload, unspecified: Secondary | ICD-10-CM

## 2023-03-30 DIAGNOSIS — J690 Pneumonitis due to inhalation of food and vomit: Secondary | ICD-10-CM | POA: Diagnosis not present

## 2023-03-30 DIAGNOSIS — R636 Underweight: Secondary | ICD-10-CM

## 2023-03-30 MED ORDER — TRAZODONE HCL 50 MG PO TABS
25.0000 mg | ORAL_TABLET | Freq: Every evening | ORAL | Status: DC | PRN
  Administered 2023-04-01 – 2023-04-07 (×2): 25 mg via ORAL
  Filled 2023-03-30 (×3): qty 1

## 2023-03-30 NOTE — Progress Notes (Signed)
PROGRESS NOTE   Subjective/Complaints: Continues to report difficulty swallowing and occasional cough although she feels like this is gradually improving, she had MBS today. She had insomnia last night. She is off [place] and now on room ain.    ROS +cough, +difficulty swallowing, + insomnia, denies HA, SOB,  + hip pain-controlled   Objective:   DG Chest 2 View  Result Date: 03/29/2023 CLINICAL DATA:  Cough EXAM: CHEST - 2 VIEW COMPARISON:  CXR 03/27/23 FINDINGS: Unchanged small left pleural effusion. Unchanged cardiac and mediastinal contours. Unchanged consolidative opacity at the left lung base which could represent atelectasis or infection. Prominent bilateral interstitial opacities are favored to represent pulmonary venous congestion. No radiographically apparent displaced rib fractures. Vertebral body heights are maintained. Visualized upper abdomen is unremarkable. Vertebral body heights are maintained. IMPRESSION: No significant change compared to recent prior chest radiograph dated 03/27/2023 with a small left pleural effusion and a consolidative left basilar opacity. Electronically Signed   By: Lorenza Cambridge M.D.   On: 03/29/2023 11:13   Recent Labs    03/28/23 0608  WBC 6.1  HGB 9.3*  HCT 28.0*  PLT 204    Recent Labs    03/28/23 0608  NA 137  K 3.7  CL 105  CO2 23  GLUCOSE 94  BUN 17  CREATININE 0.72  CALCIUM 7.8*     Intake/Output Summary (Last 24 hours) at 03/30/2023 0813 Last data filed at 03/30/2023 0738 Gross per 24 hour  Intake 357 ml  Output 1150 ml  Net -793 ml         Physical Exam: Vital Signs Blood pressure 136/70, pulse 93, temperature 98.4 F (36.9 C), temperature source Oral, resp. rate 18, height 5\' 1"  (1.549 m), weight 44 kg, SpO2 95 %. Constitutional:      Appearance: Normal appearance. She is normal weight. BMI 18.29    Interventions:on Room air    Comments: Pt awake, alert,  appropriate,  on RA, appears younger than stated age, NAD, sitting up in bedside chair playing card came with family HENT:     Head: Normocephalic and atraumatic.     Right Ear: External ear normal.     Left Ear: External ear normal.     Nose: Nose normal. No congestion.     Mouth/Throat:     Mouth: Mucous membranes are dry.     Pharynx: Oropharynx is clear. No oropharyngeal exudate.  Eyes:     General:        Right eye: No discharge.        Left eye: No discharge.     Extraocular Movements: Extraocular movements intact.  Cardiovascular:     Rate and Rhythm: Normal rate. Rhythm irregular.     Heart sounds: Normal heart sounds. No murmur heard.    No gallop.  Pulmonary:     Comments: Decreased at bases. Abdominal:     General: Abdomen is flat. Bowel sounds are normal. There is no distension.     Palpations: Abdomen is soft.     Tenderness: There is no abdominal tenderness.  Musculoskeletal:     Cervical back: Neck supple. No tenderness.     Comments: Left  hip incision with surgical foam dressing. Min edema left hip and thigh. Linear ecchymotic area upper right thigh.    UE strength 5-/5 throughout B/L RLE 5-/5 and LLE 2/5 in HF, otherwise 5-/5  Skin:    General: Skin is warm and dry.  Neurological:     Mental Status: She is alert and oriented to person, place, and time.     Comments: Intact to light touch in all  4 extremities  Psychiatric:        Mood and Affect: Mood normal.        Behavior: Behavior normal.    Assessment/Plan: 1. Functional deficits which require 3+ hours per day of interdisciplinary therapy in a comprehensive inpatient rehab setting. Physiatrist is providing close team supervision and 24 hour management of active medical problems listed below. Physiatrist and rehab team continue to assess barriers to discharge/monitor patient progress toward functional and medical goals  Care Tool:  Bathing    Body parts bathed by patient: Right arm, Left arm,  Chest, Abdomen, Right upper leg, Left upper leg, Right lower leg, Left lower leg, Face   Body parts bathed by helper: Front perineal area, Buttocks     Bathing assist Assist Level: Moderate Assistance - Patient 50 - 74%     Upper Body Dressing/Undressing Upper body dressing   What is the patient wearing?: Pull over shirt    Upper body assist Assist Level: Supervision/Verbal cueing    Lower Body Dressing/Undressing Lower body dressing      What is the patient wearing?: Pants     Lower body assist Assist for lower body dressing: Moderate Assistance - Patient 50 - 74%     Toileting Toileting    Toileting assist Assist for toileting: Moderate Assistance - Patient 50 - 74%     Transfers Chair/bed transfer  Transfers assist     Chair/bed transfer assist level: Maximal Assistance - Patient 25 - 49%     Locomotion Ambulation   Ambulation assist      Assist level: Minimal Assistance - Patient > 75% Assistive device: Walker-rolling Max distance: 28ft   Walk 10 feet activity   Assist     Assist level: Minimal Assistance - Patient > 75% Assistive device: Walker-rolling   Walk 50 feet activity   Assist Walk 50 feet with 2 turns activity did not occur: Safety/medical concerns (weakness/deconditioning, fatigue)         Walk 150 feet activity   Assist Walk 150 feet activity did not occur: Safety/medical concerns (weakness/deconditioning, fatigue)         Walk 10 feet on uneven surface  activity   Assist Walk 10 feet on uneven surfaces activity did not occur: Safety/medical concerns (weakness/deconditioning, fatigue)         Wheelchair     Assist Is the patient using a wheelchair?: Yes Type of Wheelchair: Manual    Wheelchair assist level: Dependent - Patient 0% Max wheelchair distance: 31ft    Wheelchair 50 feet with 2 turns activity    Assist    Wheelchair 50 feet with 2 turns activity did not occur: Safety/medical concerns  (weakness/deconditioning, fatigue)       Wheelchair 150 feet activity     Assist  Wheelchair 150 feet activity did not occur: Safety/medical concerns (weakness/deconditioning, fatigue)       Blood pressure 136/70, pulse 93, temperature 98.4 F (36.9 C), temperature source Oral, resp. rate 18, height 5\' 1"  (1.549 m), weight 44 kg, SpO2 95 %.  Medical Problem List and Plan: 1. Functional deficits secondary to L femoral neck fx s/p hemiarthroplasty             -patient may  shower with covering incision             -ELOS/Goals: 10-12 days mod I to supervision  Grounds pass ordered   -Est discharge 6/3 -Interdisciplinary Team Conference today   2.  Antithrombotics: -DVT/anticoagulation:  SCDs/TEDs/ASA              -antiplatelet therapy: ASA 81 mg po BID 3. Pain Management: Hydrocodone prn vs tylenol prn.  4. Mood/Behavior/Sleep: LCSW to follow for evaluation and support.              -antipsychotic agents: N/A 5. Neuropsych/cognition: This patient is capable of making decisions on her own behalf. 6. Skin/Wound Care: Routine pressure relief measures.  7. Fluids/Electrolytes/Nutrition:  Monitor I/O. Check CMET in am. 8. Left hip subcapital femoral neck  fx s/p anterior hemi: WBAT 9. Aspiration PNA: Continues to have frequent cough-->will check f/u CXR to rule out underlying overload.             --Continue Unasyn D# 3/5-7?             -con't 2L O2 by [place]- wean as tolerated  -tolerating RA this afternoon 10. Severe GERD: On Protonix IV BID-->will change to oral route 11. Chronic dysphagia/Esophageal stricture: Is able to modify diet             --last swallow study without evidence of aspiration and no change in stricture for past 4 years             --does not like Ensure--make her own shakes at home. Will try [employer] supplement. Pt CAN use nectar thick liquids if she wants, however can stay on thin liquids.   -5/24 GI was contacted yesterday, no plans for EGD unless MBS  shows something new.  She had MBS today,  SLP continuing dys 3/thin although she will need to avoid most solid foods 12. CAF: Monitor HR TID--continue Tambocor  HR controlled 13. ABLA: Recheck CBC in am. 14. Fluid overload: Monitor weight daily. Encourage low salt diet. Current weight reviewed and is 18.29  -Wt appears stable   Filed Weights   03/27/23 1433 03/28/23 0603 03/30/23 0500  Weight: 45.4 kg 43.9 kg 44 kg    15. Underweight: provided dietary education  -5/24 Nutrition adding ice cream to meals  16. Difficulty swallowing 2/2 chronic esophageal stricture: SLP ordered, GI consulted  17. Small left pleural effusion: 20mg  IV Lasix ordered  18. Suboptimal vitamin D: liquid vitamin D ordered   19. Reduced TIBC: daily iron supplement ordered  20. Insomnia  -5/24 will try trazodone PRN HS, DC melatonin   LOS: 3 days A FACE TO FACE EVALUATION WAS PERFORMED  Fanny Dance 03/30/2023, 8:13 AM

## 2023-03-30 NOTE — Progress Notes (Signed)
Patient ID: Michelle Vazquez, female   DOB: [birth year], 89 y.o.   MRN: [id number] Met with the patient to review current situation, rehab process, team conference and plan of care. Discussed medications and dietary modifications; just returned from [facility]. Noted swallowing issues complicated by pna, productive cough.  Reported constipation addressed and appetite has improved. Reviewed daily weight checks, reports pain controlled. Continues to wean off O2. Continue to follow along to address educational needs to facilitate preparation for discharge. Pamelia Hoit

## 2023-03-30 NOTE — Progress Notes (Addendum)
Modified Barium Swallow Study  Patient Details  Name: Michelle Vazquez MRN: [id number] Date of Birth: 07-22-1929  Today's Date: 03/30/2023  HPI/PMH: HPI: Michelle Vazquez is a 89 y.o. female who presented to [facility] ED on 03/21/23 after mechanical fall on the left hip. She underwent left direct anterior hip hemiarthroplasty on 03/23/23. Post op, patient developed respiratory failure with hypoxia and possible pna. She was admitted to [facility] on 03/27/23. She has a hx of severe esophageal stricture and esophageal dysphagia. Has undergone multiple dilations over the last three years, most recent was 02/27/23 which revealed "Persistent/recurrent high-grade stricture of the cervical  esophagus with associated piriformis sinus dilation, similar to the prior esophagram of 03/17/2021. 2. Limited evaluation of the esophagus more distally, as described.3. Large volume spontaneous gastroesophageal reflux.  4. Small hiatal hernia."  She has been followed by GI and ENT and has had no significant improvement in dysphagia symptoms, minimal improvement after last procedure. She consumes a modified diet, and states that she has been able to maintain her weight with use of protein shakes and soft foods. She is unable to consume most meats or other bulky food items.   Clinical Impression: Clinical Impression: Pt presents with a severe esophagopharyngeal dysphagia marked by minimal distention of the pharyngoesophageal segment (PES), despite much repetitive effort on the pt's part to transfer the bolus. Only thin liquids and one 1/2 teaspoon bolus of puree were offered due to the severity of retention above the PES. There was no aspiration of material. There was partial epiglottic inversion, but excellent compensation with tight contact of the epiglottic petiole to the arytenoid cartilages, preventing airway intrusion. Oral phase was functional.  Postural adjustments were attempted in an effort to facilitate PES patency - head  turns left and right, posterior head tilt, and a reclined position.  The latter may have offered marginal improvement in flow, but this was negligible.  PTA Ms. Pierrot has been managing her dysphagia well, making fruit and protein-enhanced smoothies and modifying her diet. While admitted, she will do well to focus on liquids/shakes and to avoid solid foods.  She has a good understanding of these issues.  Continue dysphagia 3/thin liquids to allow more selections, recognizing she will need to avoid most solid foods. She reports success with soups and yogurt.  If family is able to bring in food items from home that she is accustomed to - e.g., protein smoothies - I would encourage that as much as possible.     Recommendations/Plan: Swallowing Evaluation Recommendations Swallowing Evaluation Recommendations Recommendations: PO diet PO Diet Recommendation: Dysphagia 3 (Mechanical soft); Thin liquids (Level 0) Liquid Administration via: Straw; Cup; Spoon Medication Administration: -- (crush with liquid) Supervision: Patient able to self-feed Swallowing strategies  : -- (try reclining if it is helpful for pt - she will be able to judge the benefits) Postural changes: Partially reclined for meals Oral care recommendations: Oral care BID (2x/day)    Treatment Plan Treatment Plan Treatment recommendations: per initial SLP evaluation     Recommendations Recommendations for follow up therapy are one component of a multi-disciplinary discharge planning process, led by the attending physician.  Recommendations may be updated based on patient status, additional functional criteria and insurance authorization.  Assessment: Orofacial Exam: Orofacial Exam Oral Cavity: Oral Hygiene: WFL Oral Cavity - Dentition: Adequate natural dentition Oral Motor/Sensory Function: WFL    Anatomy:  No data recorded  Boluses Administered: Boluses Administered Boluses Administered: Thin liquids (Level 0)  Oral Impairment Domain: Oral Impairment Domain Lip Closure: No labial escape Tongue control during bolus hold: Cohesive bolus between tongue to palatal seal Bolus transport/lingual motion: Brisk tongue motion Oral residue: Complete oral clearance Location of oral residue : N/A Initiation of pharyngeal swallow : Valleculae     Pharyngeal Impairment Domain: Pharyngeal Impairment Domain Soft palate elevation: No bolus between soft palate (SP)/pharyngeal wall (PW) Laryngeal elevation: Complete superior movement of thyroid cartilage with complete approximation of arytenoids to epiglottic petiole Anterior hyoid excursion: Partial anterior movement Epiglottic movement: Partial inversion Laryngeal vestibule closure: Complete, no air/contrast in laryngeal vestibule Pharyngeal stripping wave : Present - diminished Pharyngeal contraction (A/P view only): N/A Pharyngoesophageal segment opening: Minimal distention/minimal duration, marked obstruction of flow Tongue base retraction: Trace column of contrast or air between tongue base and PPW Pharyngeal residue: Minimal to no pharyngeal clearance Location of pharyngeal residue: Pyriform sinuses     Esophageal Impairment Domain: Esophageal Impairment Domain Esophageal clearance upright position: Esophageal retention    Pill: Esophageal Impairment Domain Esophageal clearance upright position: Esophageal retention    Penetration/Aspiration Scale Score: Penetration/Aspiration Scale Score 1.  Material does not enter airway: Thin liquids (Level 0); Puree    Compensatory Strategies: Compensatory Strategies Compensatory strategies: Yes Straw: Ineffective Ineffective Straw: Thin liquid (Level 0) Effortful swallow: Ineffective Ineffective Effortful Swallow: Thin liquid (Level 0) Left head turn: Ineffective Ineffective Left Head Turn: Thin liquid (Level 0) Right head turn: Ineffective Ineffective Right Head Turn: Thin liquid (Level  0) Reclining posture: Ineffective Ineffective Reclining Posture: Thin liquid (Level 0) Posterior head tilt: Ineffective Ineffective Posterior head tilt: Thin liquid (Level 0)       General Information: Caregiver present: No   Diet Prior to this Study: Dysphagia 3 (mechanical soft); Thin liquids (Level 0)    Temperature : Normal    Respiratory Status: WFL    No data recorded   History of Recent Intubation: No   Behavior/Cognition: Alert; Cooperative; Pleasant mood  Self-Feeding Abilities: Able to self-feed  No data recorded Volitional Cough: Able to elicit  Volitional Swallow: Able to elicit  No data recorded  Goal Planning: Prognosis for improved oropharyngeal function: Guarded  No data recorded No data recorded No data recorded Consulted and agree with results and recommendations: Patient   Pain: Pain Assessment Pain Assessment: Faces Pain Score: 5 Faces Pain Scale: 0 Pain Location: throat, L hip Pain Descriptors / Indicators: Sore; Discomfort Pain Intervention(s): Limited activity within patient's tolerance; Monitored during session; Repositioned; Premedicated before session; Ice applied (ice to hip)    End of Session: Start Time:SLP Start Time (ACUTE ONLY): 0907  Stop Time: SLP Stop Time (ACUTE ONLY): 0930  Time Calculation:SLP Time Calculation (min) (ACUTE ONLY): 23 min  Charges: SLP Evaluations $ SLP Speech Visit: 1 Visit  SLP Evaluations $MBS Swallow: 1 Procedure   SLP visit diagnosis: SLP Visit Diagnosis: Dysphagia, pharyngoesophageal phase (R13.14)    Past Medical History:  Past Medical History:  Diagnosis Date   Arthritis    Atrial fibrillation (HCC)    Bowel obstruction (HCC) 2000   Cataract    bilateral removed   Complication of anesthesia    hard to wake after hysterectomy, states heart stopped during a Esophagoscopy, but states she has had several surgeries and procedures with anesthesia and has never had that again    Dysrhythmia    HLD (hyperlipidemia)    Hypercontractile esophagus    Mitral valve prolapse    Osteoporosis    declines rx and declines DEXA f/u  Parathyroid adenoma    post parathyroid surgery in October of 2008   Paroxysmal supraventricular tachycardia    Pneumonia    as a child   Premature ventricular contractions (PVCs) (VPCs)    Schatzki's ring 08/2013   Dr. Rhea Belton, s/p dilation 08/2013, ?benefit   Squamous cell carcinoma in situ of skin    face   Urinary incontinence    Past Surgical History:  Past Surgical History:  Procedure Laterality Date   ABDOMINAL HYSTERECTOMY  1989   BREAST BIOPSY  1979   left   CATARACT EXTRACTION     bilateral   ESOPHAGEAL MANOMETRY N/A 11/17/2015   Procedure: ESOPHAGEAL MANOMETRY (EM);  Surgeon: Beverley Fiedler, MD;  Location: [facility];  Service: Gastroenterology;  Laterality: N/A;   ESOPHAGOSCOPY  09/17/2019   Procedure: Cervical Esophagoscopy;  Surgeon: Osborn Coho, MD;  Location: [facility] OR;  Service: ENT;;   EYE SURGERY     LAPAROSCOPIC SMALL BOWEL RESECTION  2000   LARYNGOSCOPY AND ESOPHAGOSCOPY N/A 10/21/2021   Procedure: DIRECT LARYNGOSCOPY AND ESOPHAGOSCOPY WITH DILATION OF ESOPHAGEAL NARROWING;  Surgeon: Osborn Coho, MD;  Location: [facility] OR;  Service: ENT;  Laterality: N/A;   LARYNGOSCOPY AND ESOPHAGOSCOPY N/A 07/26/2022   Procedure: DIRECT LARYNGOSCOPY; ESOPHAGOSCOPY AND ESOPHAGEAL DILATION;  Surgeon: Osborn Coho, MD;  [facility];  Service: ENT;  Laterality: N/A;   MICROLARYNGOSCOPY WITH DILATION N/A 09/17/2019   Procedure: MICROLARYNGOSCOPY WITH DILATION;  Surgeon: Osborn Coho, MD;  Location[facility] OR;  Service: ENT;  Laterality: N/A;   parathyroid surgery  2008?   left   SHOULDER SURGERY  02/1999   right   SKIN LESION EXCISION  08/1999   left side of face   TOTAL HIP ARTHROPLASTY Left 03/23/2023   Procedure: TOTAL HIP ARTHROPLASTY ANTERIOR APPROACH;  Surgeon: Kathryne Hitch, MD;  Location: [facility] ORS;  Service:  Orthopedics;  Laterality: Left;   Scarlet Abad L. Samson Frederic, MA CCC/SLP Clinical Specialist - Acute Care SLP Acute Rehabilitation Services Office number [phone]  Blenda Mounts Laurice 03/30/2023, 1:27 PM

## 2023-03-30 NOTE — Progress Notes (Signed)
Physical Therapy Session Note  Patient Details  Name: Michelle Vazquez MRN: [id number] Date of Birth: 1929-03-10  Today's Date: 03/30/2023 PT Individual Time: 1431-1530 PT Individual Time Calculation (min): 59 min   Short Term Goals: Week 1:  PT Short Term Goal 1 (Week 1): pt will transfer bed<>chair with LRAD and CGA PT Short Term Goal 2 (Week 1): pt will ambulate 59ft with LRAD and CGA PT Short Term Goal 3 (Week 1): pt will navigate 4 steps with 1 handrail and min A  Skilled Therapeutic Interventions/Progress Updates: Pt presents sitting in w/c and agreeable to therapy.  Pt wheeled to main gym.  Pt amb x 15' to 6" steps w/ RW and CGA/supervision, verbal cues for reciprocal gait pattern and upright posture.  Pt negotiated 4 steps w/ B hands on L rail (simulating home availability).  Pt required verbal cues for sequencing and breathing technique.  Pt amb to w/c and O2 sats measured at 89-90% on RA, but quickly increased to 97% w/in 30 seconds.  Pt amb w/ RW x 120' x 3 trials and CGA/supervision, but verbal cues for upright posture and reciprocal gait pattern.  Pt required seated rest breaks between trials w/ O2 sats decreased to 90% but then quickly increasing to >94%.  HR at 101 bpm.  Pt returned to room and remained sitting in w/c w/ all needs in reach, granddaughter present in room.     Therapy Documentation Precautions:  Precautions Precautions: Fall Precaution Comments: monitor O2 Restrictions Weight Bearing Restrictions: No LLE Weight Bearing: Weight bearing as tolerated General:   Vital Signs: Therapy Vitals Temp: 98.2 F (36.8 C) Temp Source: Oral Pulse Rate: 98 Resp: 18 BP: 125/62 Patient Position (if appropriate): Sitting Oxygen Therapy SpO2: 95 % O2 Device: Room Air Pain:0/10      Therapy/Group: Individual Therapy  Lucio Edward 03/30/2023, 3:51 PM

## 2023-03-30 NOTE — Progress Notes (Signed)
Occupational Therapy Session Note  Patient Details  Name: Michelle Vazquez MRN: [id number] Date of Birth: Mar 24, 1929  Today's Date: 03/30/2023 OT Individual Time: 1105-1200 OT Individual Time Calculation (min): 55 min    Short Term Goals: Week 1:  OT Short Term Goal 1 (Week 1): Pt will complete sit > stand with CGA using LRAD in prep for ADL task OT Short Term Goal 2 (Week 1): Pt will complete toilet transfer with CGA using LRAD OT Short Term Goal 3 (Week 1): Pt will use AE PRN to thread LB clothing with supervision  Skilled Therapeutic Interventions/Progress Updates:  Skilled OT intervention completed with focus on ADL retraining and functional ambulation at the shower level. Pt received upright in bed asleep, easily woken, agreeable to session. No pain reported.  Agreeable to shower. Transitioned to EOB with min A for LLE management. CGA sit > stand using RW then CGA ambulatory transfer to TTB with min cues for positioning and walking over incline threshold in bathroom. Total A to doff LB clothing for time. Noted to be heavily incontinent of void in brief with pt indicating she is normally incontinent when she coughs which is pretty frequent.  OT covered R forearm IV and L hip surgical bandage with waterproof seal prior to shower. Noted bleed underneath hip bandage but not bleeding through. Pt able to bathe with overall min A for B feet only. Able to stand with CGA for periareas using grab bar and maintain standing balance with close supervision. Urgent reported needed for attempt at [facility]. Min A side step using grab bar out of shower to RW then CGA ambulatory transfer to [facility] over toilet. Continent of void, with only quarter size amount of BM. Able to manage all wiping seated with supervision. Threaded brief/pants for time with total A, then CGA sit > stand using RW and min A needed for brief but only CGA for pants over hips. Ambulated with CGA using RW to recliner with less cues needed over  threshold.   Pt remained seated in recliner, with chair alarm on/activated, and with all needs in reach at end of session.  Vitals -Received on 1L via [place], SPO2 93% -85% after RA during ambulatory transfer to TTB; donned 1 L for O2 support during shower with rebound to > 95%.  -92% after shower on 1 L, therefore remained on 1L at end of session  Therapy Documentation Precautions:  Precautions Precautions: Fall Precaution Comments: monitor O2 Restrictions Weight Bearing Restrictions: No LLE Weight Bearing: Weight bearing as tolerated    Therapy/Group: Individual Therapy  Melvyn Novas, MS, OTR/L  03/30/2023, 12:17 PM

## 2023-03-30 NOTE — IPOC Note (Signed)
Overall Plan of Care [facility]) Patient Details Name: Michelle Vazquez MRN: [id number] DOB: 06/19/1929  Admitting Diagnosis: Closed left hip fracture, sequela  Hospital Problems: Principal Problem:   Closed left hip fracture, sequela     Functional Problem List: Nursing Bladder, Safety, Endurance, Medication Management, Pain  PT Balance, Edema, Endurance, Nutrition, Pain, Skin Integrity  OT Balance, Edema, Endurance, Motor, Pain, Nutrition  SLP Nutrition  TR         Basic ADL's: OT Bathing, Dressing, Toileting     Advanced  ADL's: OT Simple Meal Preparation     Transfers: PT Bed Mobility, Bed to Chair, Car, Occupational psychologist, Research scientist (life sciences): PT Ambulation, Psychologist, prison and probation services, Stairs     Additional Impairments: OT None  SLP Swallowing      TR      Anticipated Outcomes Item Anticipated Outcome  Self Feeding Independent  Swallowing  Mod I   Basic self-care  Mod I-supervision  Toileting  Mod I   Bathroom Transfers Mod I  Bowel/Bladder  manage bladder w toileting  Transfers  mod I with LRAD  Locomotion  mod I with LRAD  Communication     Cognition     Pain  < 4 with prns  Safety/Judgment  manage w cues   Therapy Plan: PT Intensity: Minimum of 1-2 x/day ,45 to 90 minutes PT Frequency: 5 out of 7 days PT Duration Estimated Length of Stay: 10-12 days OT Intensity: Minimum of 1-2 x/day, 45 to 90 minutes OT Frequency: 5 out of 7 days OT Duration/Estimated Length of Stay: 10-12 days SLP Intensity: Minumum of 1-2 x/day, 30 to 90 minutes SLP Frequency: 1 to 3 out of 7 days SLP Duration/Estimated Length of Stay: 04/09/23   Team Interventions: Nursing Interventions Bladder Management, Medication Management, Discharge Planning, Disease Management/Prevention, Pain Management, Patient/Family Education, Dysphagia/Aspiration Precaution Training  PT interventions Ambulation/gait training, Discharge planning, Functional mobility training, Psychosocial  support, Therapeutic Activities, Balance/vestibular training, Disease management/prevention, Neuromuscular re-education, Skin care/wound management, Therapeutic Exercise, Wheelchair propulsion/positioning, DME/adaptive equipment instruction, Pain management, UE/LE Strength taining/ROM, Firefighter, Equities trader education, Museum/gallery curator, UE/LE Coordination activities  OT Interventions Warden/ranger, Discharge planning, Pain management, Self Care/advanced ADL retraining, Therapeutic Activities, UE/LE Coordination activities, Disease mangement/prevention, Functional mobility training, Patient/family education, Therapeutic Exercise, DME/adaptive equipment instruction, Neuromuscular re-education, UE/LE Strength taining/ROM, Wheelchair propulsion/positioning  SLP Interventions Cueing hierarchy, Dysphagia/aspiration precaution training, Internal/external aids, Therapeutic Activities, Patient/family education  TR Interventions    SW/CM Interventions     Barriers to Discharge MD  Medical stability  Nursing Decreased caregiver support, Home environment access/layout 1 level, 4 ste right rail;Mary, daughter states that she, her husband and their daughter are going to rotate to provide 24/7 care  PT Inaccessible home environment, Home environment access/layout, Incontinence, Wound Care, Other (comments) oxygen, 4 STE with 1 handrail, lives alone and granddaughter is available intermittently.  OT Home environment access/layout, New oxygen, Incontinence    SLP      SW       Team Discharge Planning: Destination: PT-Home ,OT- Home , SLP-Home Projected Follow-up: PT-Home health PT, OT-  Outpatient OT, SLP-None Projected Equipment Needs: PT-To be determined, OT- To be determined, SLP-None recommended by SLP Equipment Details: PT-has SPC, OT-  Patient/family involved in discharge planning: PT- Patient,  OT-Patient, SLP-Patient  MD ELOS: 10-12 days Medical Rehab Prognosis:   Excellent Assessment: The patient has been admitted for CIR therapies with the diagnosis of L femoral neck fracture. The team will be addressing functional mobility,  strength, stamina, balance, safety, adaptive techniques and equipment, self-care, bowel and bladder mgt, patient and caregiver education. Goals have been set at supervision. Anticipated discharge destination is home.        See Team Conference Notes for weekly updates to the plan of care

## 2023-03-30 NOTE — Progress Notes (Signed)
Physical Therapy Session Note  Patient Details  Name: Michelle Vazquez MRN: [id number] Date of Birth: [birth year]  Today's Date: 03/30/2023 PT Individual Time: 1300-1356 PT Individual Time Calculation (min): 56 min   Short Term Goals: Week 1:  PT Short Term Goal 1 (Week 1): pt will transfer bed<>chair with LRAD and CGA PT Short Term Goal 2 (Week 1): pt will ambulate 57ft with LRAD and CGA PT Short Term Goal 3 (Week 1): pt will navigate 4 steps with 1 handrail and min A  Skilled Therapeutic Interventions/Progress Updates:   Received pt sitting in recliner, pt agreeable to PT treatment, and reported occasional pain in L hip, only when coughing. Session with emphasis on functional mobility/transfers, generalized strengthening and endurance, and gait training. Donned ted hose with total A for edema management. Pt on .5L at rest with SPO2 94% - decreased to RA and SPO2 remained >90% throughout session. Stood wth RW and CGA and ambulated 13ft x 1 and 72ft x 1 with RW and CGA fading to close supervision. Pt ambulates at decreased cadence, with flexed trunk/downward gaze, narrow BOS, and decreased weight shifting onto LLE - stride length improved with repetition. SLP present briefly to review modified swallow study from this morning. Stood with RW and CGA and performed x5 L hip flexion AROM - noted increased strength/ROM today > yesterday. Pt then performed seated knee extension x10 on LLE during active rest break. Returned to room and concluded session with pt sitting in [facility], needs within reach, and granddaughter present at bedside, awaiting upcoming PT session. Pt left on RA with SPO2 100% and NT aware.   Therapy Documentation Precautions:  Precautions Precautions: Fall Precaution Comments: monitor O2 Restrictions Weight Bearing Restrictions: No LLE Weight Bearing: Weight bearing as tolerated  Therapy/Group: Individual Therapy [name] PT, DPT 03/30/2023, 6:49 AM

## 2023-03-31 DIAGNOSIS — S72002S Fracture of unspecified part of neck of left femur, sequela: Secondary | ICD-10-CM | POA: Diagnosis not present

## 2023-03-31 NOTE — Progress Notes (Signed)
PROGRESS NOTE   Subjective/Complaints: Occ cough no SOB  Still has post op dressing asking when this would be changed, has ortho f/u appt on 6/3     ROS +cough, +difficulty swallowing, + insomnia, denies HA, SOB,  + hip pain-controlled   Objective:   DG Chest 2 View  Result Date: 03/29/2023 CLINICAL DATA:  Cough EXAM: CHEST - 2 VIEW COMPARISON:  CXR 03/27/23 FINDINGS: Unchanged small left pleural effusion. Unchanged cardiac and mediastinal contours. Unchanged consolidative opacity at the left lung base which could represent atelectasis or infection. Prominent bilateral interstitial opacities are favored to represent pulmonary venous congestion. No radiographically apparent displaced rib fractures. Vertebral body heights are maintained. Visualized upper abdomen is unremarkable. Vertebral body heights are maintained. IMPRESSION: No significant change compared to recent prior chest radiograph dated 03/27/2023 with a small left pleural effusion and a consolidative left basilar opacity. Electronically Signed   By: Lorenza Cambridge M.D.   On: 03/29/2023 11:13   No results for input(s): "WBC", "HGB", "HCT", "PLT" in the last 72 hours.  No results for input(s): "NA", "K", "CL", "CO2", "GLUCOSE", "BUN", "CREATININE", "CALCIUM" in the last 72 hours.   Intake/Output Summary (Last 24 hours) at 03/31/2023 0824 Last data filed at 03/31/2023 0809 Gross per 24 hour  Intake 460 ml  Output 150 ml  Net 310 ml         Physical Exam: Vital Signs Blood pressure 131/68, pulse 83, temperature 98 F (36.7 C), resp. rate 18, height 5\' 1"  (1.549 m), weight 52.1 kg, SpO2 95 %. Constitutional:    General: No acute distress Mood and affect are appropriate Heart: Regular rate and rhythm no rubs murmurs or extra sounds Lungs: Clear to auscultation,diminished BS at bases  breathing unlabored, no rales or wheezes Abdomen: Positive bowel sounds, soft  nontender to palpation, nondistended Extremities: No clubbing, cyanosis, or edema  Musculoskeletal:     Cervical back: Neck supple. No tenderness.     Comments: Left hip incision with surgical foam dressing.   UE strength 5-/5 throughout B/L RLE 5-/5 and LLE 3-/5 in HF, otherwise 4-/5 - pain inhibits knee ext  Skin:    General: Skin is warm and dry.  Neurological:     Mental Status: She is alert and oriented to person, place, and time.     Comments: Intact to light touch in all  4 extremities  Psychiatric:        Mood and Affect: Mood normal.        Behavior: Behavior normal.    Assessment/Plan: 1. Functional deficits which require 3+ hours per day of interdisciplinary therapy in a comprehensive inpatient rehab setting. Physiatrist is providing close team supervision and 24 hour management of active medical problems listed below. Physiatrist and rehab team continue to assess barriers to discharge/monitor patient progress toward functional and medical goals  Care Tool:  Bathing    Body parts bathed by patient: Right arm, Left arm, Chest, Abdomen, Right upper leg, Left upper leg, Right lower leg, Face, Front perineal area, Buttocks   Body parts bathed by helper: Front perineal area, Buttocks Body parts n/a: Left lower leg   Bathing assist Assist Level: Minimal Assistance -  Patient > 75%     Upper Body Dressing/Undressing Upper body dressing   What is the patient wearing?: Pull over shirt    Upper body assist Assist Level: Set up assist    Lower Body Dressing/Undressing Lower body dressing      What is the patient wearing?: Pants, Incontinence brief     Lower body assist Assist for lower body dressing: Moderate Assistance - Patient 50 - 74%     Toileting Toileting    Toileting assist Assist for toileting: Minimal Assistance - Patient > 75%     Transfers Chair/bed transfer  Transfers assist     Chair/bed transfer assist level: Contact Guard/Touching assist      Locomotion Ambulation   Ambulation assist      Assist level: Contact Guard/Touching assist Assistive device: Walker-rolling Max distance: 120   Walk 10 feet activity   Assist     Assist level: Contact Guard/Touching assist Assistive device: Walker-rolling   Walk 50 feet activity   Assist Walk 50 feet with 2 turns activity did not occur: Safety/medical concerns (weakness/deconditioning, fatigue)  Assist level: Contact Guard/Touching assist Assistive device: Walker-rolling    Walk 150 feet activity   Assist Walk 150 feet activity did not occur: Safety/medical concerns (weakness/deconditioning, fatigue)         Walk 10 feet on uneven surface  activity   Assist Walk 10 feet on uneven surfaces activity did not occur: Safety/medical concerns (weakness/deconditioning, fatigue)         Wheelchair     Assist Is the patient using a wheelchair?: Yes Type of Wheelchair: Manual    Wheelchair assist level: Dependent - Patient 0% Max wheelchair distance: 100ft    Wheelchair 50 feet with 2 turns activity    Assist    Wheelchair 50 feet with 2 turns activity did not occur:  (weakness/deconditioning, fatigue)   Assist Level: Dependent - Patient 0%   Wheelchair 150 feet activity     Assist  Wheelchair 150 feet activity did not occur:  (weakness/deconditioning, fatigue)   Assist Level: Dependent - Patient 0%   Blood pressure 131/68, pulse 83, temperature 98 F (36.7 C), resp. rate 18, height 5\' 1"  (1.549 m), weight 52.1 kg, SpO2 95 %.    Medical Problem List and Plan: 1. Functional deficits secondary to L femoral neck fx s/p hemiarthroplasty             -patient may  shower with covering incision             -ELOS/Goals: 10-12 days mod I to supervision  Grounds pass ordered   -Est discharge 6/3 -Interdisciplinary Team Conference today   2.  Antithrombotics: -DVT/anticoagulation:  SCDs/TEDs/ASA              -antiplatelet therapy: ASA 81  mg po BID 3. Pain Management: Hydrocodone prn vs tylenol prn.  4. Mood/Behavior/Sleep: LCSW to follow for evaluation and support.              -antipsychotic agents: N/A 5. Neuropsych/cognition: This patient is capable of making decisions on her own behalf. 6. Skin/Wound Care: Routine pressure relief measures.  7. Fluids/Electrolytes/Nutrition:  Monitor I/O. Check CMET in am. 8. Left hip subcapital femoral neck  fx s/p anterior hemi: WBAT 9. Aspiration PNA: Continues to have frequent cough-->will check f/u CXR to rule out underlying overload.             --Continue Unasyn D# 3/5-7?             -  con't 2L O2 by [place]- wean as tolerated  -tolerating RA this afternoon 10. Severe GERD: On Protonix IV BID-->will change to oral route 11. Chronic dysphagia/Esophageal stricture: Is able to modify diet             --last swallow study without evidence of aspiration and no change in stricture for past 4 years             --does not like Ensure--make her own shakes at home. Will try [employer] supplement. Pt CAN use nectar thick liquids if she wants, however can stay on thin liquids.   -5/24 GI was contacted yesterday, no plans for EGD unless MBS shows something new.  She had MBS today,  SLP continuing dys 3/thin although she will need to avoid most solid foods 12. CAF: Monitor HR TID--continue Tambocor  HR controlled Vitals:   03/30/23 2047 03/31/23 0641  BP: (!) 117/56 131/68  Pulse: 99 83  Resp:  18  Temp: 98.6 F (37 C) 98 F (36.7 C)  SpO2: 93% 95%    13. ABLA: Recheck CBC in am. 14. Fluid overload: Monitor weight daily. Encourage low salt diet. Current weight reviewed and is 18.29  -Wt appears stable   Filed Weights   03/28/23 0603 03/30/23 0500 03/31/23 0641  Weight: 43.9 kg 44 kg 52.1 kg    15. Underweight: provided dietary education  -5/24 Nutrition adding ice cream to meals  16. Difficulty swallowing 2/2 chronic esophageal stricture: SLP ordered, GI consulted  17. Small left  pleural effusion: 20mg  IV Lasix ordered  18. Suboptimal vitamin D: liquid vitamin D ordered   19. Reduced TIBC: daily iron supplement ordered  20. Insomnia  -5/24 will try trazodone PRN HS, DC melatonin   LOS: 4 days A FACE TO FACE EVALUATION WAS PERFORMED  Erick Colace 03/31/2023, 8:24 AM

## 2023-03-31 NOTE — Progress Notes (Signed)
Ok to stop Unasyn today (D7) per Dr Wynn Banker  Ulyses Southward, PharmD, BCIDP, AAHIVP, CPP Infectious Disease Pharmacist 03/31/2023 4:49 PM

## 2023-03-31 NOTE — Progress Notes (Signed)
Physical Therapy Session Note  Patient Details  Name: Michelle Vazquez MRN: [id number] Date of Birth: 10-08-1929  Today's Date: 03/31/2023 PT Individual Time: 1025-1150 PT Individual Time Calculation (min): 85 min   Short Term Goals: Week 1:  PT Short Term Goal 1 (Week 1): pt will transfer bed<>chair with LRAD and CGA PT Short Term Goal 2 (Week 1): pt will ambulate 76ft with LRAD and CGA PT Short Term Goal 3 (Week 1): pt will navigate 4 steps with 1 handrail and min A   Skilled Therapeutic Interventions/Progress Updates:  Patient seated upright in recliner with BLE elevated on entrance to room. Patient alert and agreeable to PT session.   Patient with minimal pain complaint at start of session.  Therapeutic Activity: Transfers: Pt performed sit<>stand and stand pivot transfers throughout session with light MinA initially and improving to supervision with continued movement and effort. Provided verbal cues for seat positioning and BUE use for assist to stand and control descent. Toilet transfer performed on return trip from session and performs with CGA overall for transfer and improved balance with pt able to manage clothing and pericare with supervision.   Gait Training:  Pt ambulated 175' x2 using RW with CGA improving to supervision. Initially demos step-to gait pattern with heavy BUE support into RW to prevent full WB to RLE in fear of increasing pain. However, pt demonstrated improvement during return trip to room with ability to steadily progress RW with bilateral step-through pattern and increased cadence/ pace. Pt surprising herself and very pleased with ability to perform less antalgic gait pattern with no increase in pain. Provided vc/ tc for maintaining improved gait pattern with upright posture, level gaze.  Neuromuscular Re-ed: NMR facilitated during session with focus on standing balance/ tolerance, motor control with increased muscle activation in LLE. Pt guided in foot touches  to 4" step x10 with each LE. Progressed to 6" step x10 and pt able to perform with decreasing pain as well as increasing control. Requires seated therapeutic rest break between bouts. NMR performed for improvements in motor control and coordination, balance, sequencing, judgement, and self confidence/ efficacy in performing all aspects of mobility at highest level of independence.   Patient seated in recliner with BLE elevated at end of session with brakes locked, seat pad alarm set, and all needs within reach.   Therapy Documentation Precautions:  Precautions Precautions: Fall Precaution Comments: monitor O2 Restrictions Weight Bearing Restrictions: Yes LLE Weight Bearing: Weight bearing as tolerated General:   Vital Signs: Therapy Vitals Temp: 98 F (36.7 C) Pulse Rate: 96 Resp: 16 BP: (!) 118/50 Patient Position (if appropriate): Sitting Oxygen Therapy SpO2: 95 % O2 Device: Room Air Pain:  Minimal pain without numeric rating with improved pain throughout session.   Therapy/Group: Individual Therapy  Loel Dubonnet PT, DPT, CSRS 03/31/2023, 3:45 PM

## 2023-03-31 NOTE — Progress Notes (Signed)
Occupational Therapy Session Note  Patient Details  Name: Michelle Vazquez MRN: [id number] Date of Birth: July 23, 1929  Today's Date: 03/31/2023 OT Individual Time: 4098-1191 OT Individual Time Calculation (min): 56 min    Short Term Goals: Week 1:  OT Short Term Goal 1 (Week 1): Pt will complete sit > stand with CGA using LRAD in prep for ADL task OT Short Term Goal 2 (Week 1): Pt will complete toilet transfer with CGA using LRAD OT Short Term Goal 3 (Week 1): Pt will use AE PRN to thread LB clothing with supervision  Skilled Therapeutic Interventions/Progress Updates:    Patient received finishing breakfast seated in wheelchair.  Agreeable to sink bath and changing clothing - reports taking a shower yesterday.  Patient walked to sink with min assist.  Completed sponge bath and changing shirt with set up assist.  Walked to bathroom with min assist using RW and cueing to relax shoulders, reduce tension.  Back to recliner with compression hose on and feet elevated to reduce LE swelling.  Bilateral feet/ankles swollen this am, reports no pain - no heat noted.   Left up in chair with chair pad alarm engaged and call bell / personal items in reach.    Therapy Documentation Precautions:  Precautions Precautions: Fall Precaution Comments: monitor O2 Restrictions Weight Bearing Restrictions: Yes LLE Weight Bearing: Weight bearing as tolerated    Pain: Denies pain    Therapy/Group: Individual Therapy  Collier Salina 03/31/2023, 8:56 AM

## 2023-04-01 MED ORDER — PANTOPRAZOLE SODIUM 40 MG PO TBEC
40.0000 mg | DELAYED_RELEASE_TABLET | Freq: Two times a day (BID) | ORAL | Status: DC
  Administered 2023-04-01 – 2023-04-02 (×2): 40 mg via ORAL
  Filled 2023-04-01 (×3): qty 1

## 2023-04-01 NOTE — Progress Notes (Signed)
Occupational Therapy Session Note  Patient Details  Name: Michelle Vazquez MRN: [id number] Date of Birth: 05-22-1929  Today's Date: 04/01/2023 OT Individual Time: 3244-0102; 1300-1330 OT Individual Time Calculation (min): 88 min    Short Term Goals: Week 1:  OT Short Term Goal 1 (Week 1): Pt will complete sit > stand with CGA using LRAD in prep for ADL task OT Short Term Goal 2 (Week 1): Pt will complete toilet transfer with CGA using LRAD OT Short Term Goal 3 (Week 1): Pt will use AE PRN to thread LB clothing with supervision  Skilled Therapeutic Interventions/Progress Updates: Patient received sitting up in the recliner. Agreeable to OT treatment. Assisted to the bathroom with CGA using the RW. Able to do her her clothing management and peri care with close SBA. Ambulated to the sink with RW to perform grooming and hygiene. Good standing tolerance. Continued treatment working on bed mobility discussing the height of patient's bed at home and techniques to get the LLE into the bed without assist. Continued treatment session working on UE there ex in the bed utilizing yellow theraBand. Exercises tolerated well. Continue with skilled OT POC to working on regaining independence with all self care activities.  Visit 2: Patient seen with daughter and granddaughter present for functional transfer training. Worked on Psychiatric nurse transfer focusing on home set up and obstacles that we should practice. Patient stating that there is a built in seat, but patient and family in agreement that patient would be safer transferring onto a BSC or shower chair with arms vs turning and stepping back once in the shower to get to the bench. Patient states there is a grab bar but not likely in an accessible place when stepping into the shower. Patient able to perform a simulated transfer with only min assist following verbal cues and return demo. Ambulated back to room with RW CGA for assist with toileting. Patient able to  manage clothing with only CGA and assist with securing brief. Patient stated up in w/c following toileting and grooming. No complaints during treatment, but does report feeling a bit less strong with her walking today than yesterday. Good motivation to work towards discharge home.    Therapy Documentation Precautions:  Precautions Precautions: Fall Precaution Comments: monitor O2 Restrictions Weight Bearing Restrictions: Yes LLE Weight Bearing: Weight bearing as tolerated General:   Vital Signs: Therapy Vitals Temp: 98.8 F (37.1 C) Temp Source: Oral Pulse Rate: 96 Resp: 18 BP: 129/67 Patient Position (if appropriate): Lying Oxygen Therapy SpO2: 96 % O2 Device: Room Air Pain:   ADL: ADL Eating: Set up Where Assessed-Eating: Wheelchair Grooming: Setup Where Assessed-Grooming: Wheelchair Upper Body Bathing: Supervision/safety Where Assessed-Upper Body Bathing: Sitting at sink Lower Body Bathing: Moderate assistance Where Assessed-Lower Body Bathing: Sitting at sink, Standing at sink (simulated) Upper Body Dressing: Supervision/safety Where Assessed-Upper Body Dressing: Wheelchair Lower Body Dressing: Moderate assistance Where Assessed-Lower Body Dressing: Sitting at sink, Standing at sink Toileting: Moderate assistance Where Assessed-Toileting: Wheelchair (simulated) Toilet Transfer: Minimal Dentist Method: Surveyor, minerals: Other (comment) (simulated to EOB with RW) Tub/Shower Transfer: Unable to assess Tub/Shower Transfer Method: Unable to assess [employer] Transfer: Unable to assess [employer] Method: Unable to assess      Therapy/Group: Individual Therapy  Warnell Forester 04/01/2023, 3:37 PM

## 2023-04-01 NOTE — Progress Notes (Signed)
Physical Therapy Session Note  Patient Details  Name: Michelle Vazquez MRN: [id number] Date of Birth: 01-Dec-1928  Today's Date: 04/01/2023 PT Individual Time: 1115-1210 and 1430-1530 PT Individual Time Calculation (min): 55 min and 60 min  Short Term Goals: Week 1:  PT Short Term Goal 1 (Week 1): pt will transfer bed<>chair with LRAD and CGA PT Short Term Goal 2 (Week 1): pt will ambulate 26ft with LRAD and CGA PT Short Term Goal 3 (Week 1): pt will navigate 4 steps with 1 handrail and min A  Skilled Therapeutic Interventions/Progress Updates: Tx1: Pt presented in bed agreeable to therapy. Pt denies pain at rest, increased to 6/10 with mobility, nsg notified and pain meds provided during session. Pt performed bed mobility with supervision and use of bed features. Pt requesting to use bathroom prior to leaving room. Performed ambulatory transfer with CGA to toilet and was CGA for toilet transfers (continent urinary void). Once completed pt then ambulated to sink to complete hand hygiene in standing with close supervision. Pt then ambulated ~70ft to nsg station with shortened step length, poor L foot clearance, and mild antalgic gait. Pt then indicated increased pain compared to previous day, discussed timing of pain meds and pt realized that she had not taken pain meds, nsg notified and meds received. Pt transported to main gym and performed ambulatory transfer to high/low mat. Session shifted to supine and seated therex with pt agreeable to work on standing activities during pm session. Pt required minA for LLE management for sit to supine and performed the following: RLE 2.5lb and LLE 1.5lb, SAQ 2 x 10, heel slides 2 x 10, bridges x 10. Pt required rest breaks between bouts. Pt returned to EOM with minA for truncal support and performed LAQ 2 x 10. Pt then ambulated an additional 15ft with RW and CGA however requested to be transported back to room due to increased pain in hip. In room pt agreeable to  remain in w/c vs recliner (per pt request) and left with belt alarm on, call bell within reach and needs met.    Tx2: Pt presented in w/c agreeable to therapy. Pt states pain more controlled this afternoon. Daughter and granddaughter present throughout session. Pt ambulated ~60ft to nsg station and decreased self selected gait speed poor L foot clearance and antalgic gait. Pt transported remaining distance to main gym. Pt mildly frustrated that she feels that she's not walking as well as she did yesterday. Reviewed yesterday's notes with her and educated that pt did significant amount of work yesterday and ambulated greater distance than previously, therefore increased soreness may be contributed to that. In gym pt performed toe taps to 4in step with RLE for increased weight bearing on LLE then progressed to alternating toe taps to work on L hip flexors. Pt then transported to stairs and ascended/descended x 4 steps with L rail with CGA. Pt then ambulated over 6 in hurdles a total of 16 times leading with RLE then leading with LLE. Pt provided cues to increase hip flexion for clearance as pt noted to internally rotate hip to compensate for decreased L foot clearance. Pt demonstrated much improvement with continued repetitions. Pt then ambulated back to room with RW and CGA fading to close supervision. Pt noted to have improved cadence, improved foot clearance and more equal step length. Pt returned to w/c at end of session and left with call bell within reach and needs met.       Therapy Documentation Precautions:  Precautions Precautions: Fall Precaution Comments: monitor O2 Restrictions Weight Bearing Restrictions: Yes LLE Weight Bearing: Weight bearing as tolerated General:   Vital Signs: Therapy Vitals Temp: (!) 97.5 F (36.4 C) Pulse Rate: 82 Resp: 17 BP: (!) 106/50 Patient Position (if appropriate): Sitting Oxygen Therapy SpO2: 96 % O2 Device: Room Air Pain:   Mobility:    Locomotion :    Trunk/Postural Assessment :    Balance:   Exercises:   Other Treatments:      Therapy/Group: Individual Therapy  Kerry Odonohue 04/01/2023, 4:41 PM

## 2023-04-01 NOTE — Progress Notes (Signed)
PHARMACIST - PHYSICIAN COMMUNICATION  DR:   Kirsteins  CONCERNING: IV to Oral Route Change Policy  RECOMMENDATION: This patient is receiving protonix by the intravenous route.  Based on criteria approved by the Pharmacy and Therapeutics Committee, the intravenous medication(s) is/are being converted to the equivalent oral dose form(s).   DESCRIPTION: These criteria include: The patient is eating (either orally or via tube) and/or has been taking other orally administered medications for a least 24 hours The patient has no evidence of active gastrointestinal bleeding or impaired GI absorption (gastrectomy, short bowel, patient on TNA or NPO).  If you have questions about this conversion, please contact the Pharmacy Department  []  ( [phone] )  [place] []  ( [phone] )  [facility] [x]  ( [phone] )  [place] []  ( [phone] )  [facility] []  ( [phone] )  [facility]     

## 2023-04-02 DIAGNOSIS — S72002S Fracture of unspecified part of neck of left femur, sequela: Secondary | ICD-10-CM | POA: Diagnosis not present

## 2023-04-02 LAB — CBC WITH DIFFERENTIAL/PLATELET
Abs Immature Granulocytes: 0.08 10*3/uL — ABNORMAL HIGH (ref 0.00–0.07)
Basophils Absolute: 0.1 10*3/uL (ref 0.0–0.1)
Basophils Relative: 1 %
Eosinophils Absolute: 0.3 10*3/uL (ref 0.0–0.5)
Eosinophils Relative: 3 %
HCT: 30 % — ABNORMAL LOW (ref 36.0–46.0)
Hemoglobin: 9.8 g/dL — ABNORMAL LOW (ref 12.0–15.0)
Immature Granulocytes: 1 %
Lymphocytes Relative: 25 %
Lymphs Abs: 2.1 10*3/uL (ref 0.7–4.0)
MCH: 31.6 pg (ref 26.0–34.0)
MCHC: 32.7 g/dL (ref 30.0–36.0)
MCV: 96.8 fL (ref 80.0–100.0)
Monocytes Absolute: 0.9 10*3/uL (ref 0.1–1.0)
Monocytes Relative: 11 %
Neutro Abs: 5.1 10*3/uL (ref 1.7–7.7)
Neutrophils Relative %: 59 %
Platelets: 370 10*3/uL (ref 150–400)
RBC: 3.1 MIL/uL — ABNORMAL LOW (ref 3.87–5.11)
RDW: 13.3 % (ref 11.5–15.5)
WBC: 8.6 10*3/uL (ref 4.0–10.5)
nRBC: 0 % (ref 0.0–0.2)

## 2023-04-02 LAB — BASIC METABOLIC PANEL
Anion gap: 7 (ref 5–15)
BUN: 23 mg/dL (ref 8–23)
CO2: 30 mmol/L (ref 22–32)
Calcium: 8.6 mg/dL — ABNORMAL LOW (ref 8.9–10.3)
Chloride: 100 mmol/L (ref 98–111)
Creatinine, Ser: 0.88 mg/dL (ref 0.44–1.00)
GFR, Estimated: >60 mL/min (ref >60)
Glucose, Bld: 98 mg/dL (ref 70–99)
Potassium: 4.3 mmol/L (ref 3.5–5.1)
Sodium: 137 mmol/L (ref 135–145)

## 2023-04-02 MED ORDER — FAMOTIDINE 20 MG PO TABS
20.0000 mg | ORAL_TABLET | Freq: Two times a day (BID) | ORAL | Status: DC
  Administered 2023-04-02 – 2023-04-08 (×12): 20 mg via ORAL
  Filled 2023-04-02 (×12): qty 1

## 2023-04-02 NOTE — Progress Notes (Signed)
PROGRESS NOTE   Subjective/Complaints: C/o pain in left hip, aggravated with walking, improved with following exercises. She is determined to return to her prior level of function.     ROS +cough, +difficulty swallowing, + insomnia, denies HA, SOB,  + hip pain-controlled, worse with ambulation   Objective:   No results found. Recent Labs    04/02/23 0536  WBC 8.6  HGB 9.8*  HCT 30.0*  PLT 370   Recent Labs    04/02/23 0536  NA 137  K 4.3  CL 100  CO2 30  GLUCOSE 98  BUN 23  CREATININE 0.88  CALCIUM 8.6*    Intake/Output Summary (Last 24 hours) at 04/02/2023 0936 Last data filed at 04/02/2023 0754 Gross per 24 hour  Intake 980 ml  Output --  Net 980 ml        Physical Exam: Vital Signs Blood pressure 123/61, pulse 88, temperature 98 F (36.7 C), temperature source Oral, resp. rate 18, height 5\' 1"  (1.549 m), weight 51 kg, SpO2 94 %. Constitutional:    General: No acute distress Mood and affect are appropriate Heart: Regular rate and rhythm no rubs murmurs or extra sounds Lungs: Clear to auscultation,diminished BS at bases  breathing unlabored, no rales or wheezes Abdomen: Positive bowel sounds, soft nontender to palpation, nondistended Extremities: No clubbing, cyanosis, or edema  Musculoskeletal:     Cervical back: Neck supple. No tenderness.     Comments: Left hip incision with surgical foam dressing.   UE strength 5-/5 throughout B/L RLE 5-/5 and LLE 3-/5 in HF, otherwise 4-/5 - pain inhibits knee ext  Poor left foot clearance Skin:    General: Skin is warm and dry.  Neurological:     Mental Status: She is alert and oriented to person, place, and time.     Comments: Intact to light touch in all  4 extremities  Psychiatric:        Mood and Affect: Mood normal.        Behavior: Behavior normal.    Assessment/Plan: 1. Functional deficits which require 3+ hours per day of interdisciplinary  therapy in a comprehensive inpatient rehab setting. Physiatrist is providing close team supervision and 24 hour management of active medical problems listed below. Physiatrist and rehab team continue to assess barriers to discharge/monitor patient progress toward functional and medical goals  Care Tool:  Bathing    Body parts bathed by patient: Right arm, Left arm, Chest, Abdomen, Right upper leg, Left upper leg, Right lower leg, Face, Front perineal area, Buttocks   Body parts bathed by helper: Front perineal area, Buttocks Body parts n/a: Left lower leg   Bathing assist Assist Level: Minimal Assistance - Patient > 75%     Upper Body Dressing/Undressing Upper body dressing   What is the patient wearing?: Pull over shirt    Upper body assist Assist Level: Set up assist    Lower Body Dressing/Undressing Lower body dressing      What is the patient wearing?: Pants, Incontinence brief     Lower body assist Assist for lower body dressing: Moderate Assistance - Patient 50 - 74%     Toileting Toileting  Toileting assist Assist for toileting: Contact Guard/Touching assist     Transfers Chair/bed transfer  Transfers assist     Chair/bed transfer assist level: Supervision/Verbal cueing     Locomotion Ambulation   Ambulation assist      Assist level: Supervision/Verbal cueing Assistive device: Walker-rolling Max distance: 177ft   Walk 10 feet activity   Assist     Assist level: Supervision/Verbal cueing Assistive device: Walker-rolling   Walk 50 feet activity   Assist Walk 50 feet with 2 turns activity did not occur: Safety/medical concerns (weakness/deconditioning, fatigue)  Assist level: Supervision/Verbal cueing Assistive device: Walker-rolling    Walk 150 feet activity   Assist Walk 150 feet activity did not occur: Safety/medical concerns (weakness/deconditioning, fatigue)         Walk 10 feet on uneven surface  activity   Assist  Walk 10 feet on uneven surfaces activity did not occur: Safety/medical concerns (weakness/deconditioning, fatigue)         Wheelchair     Assist Is the patient using a wheelchair?: Yes Type of Wheelchair: Manual    Wheelchair assist level: Dependent - Patient 0% Max wheelchair distance: 79ft    Wheelchair 50 feet with 2 turns activity    Assist    Wheelchair 50 feet with 2 turns activity did not occur:  (weakness/deconditioning, fatigue)   Assist Level: Dependent - Patient 0%   Wheelchair 150 feet activity     Assist  Wheelchair 150 feet activity did not occur:  (weakness/deconditioning, fatigue)   Assist Level: Dependent - Patient 0%   Blood pressure 123/61, pulse 88, temperature 98 F (36.7 C), temperature source Oral, resp. rate 18, height 5\' 1"  (1.549 m), weight 51 kg, SpO2 94 %.    Medical Problem List and Plan: 1. Functional deficits secondary to L femoral neck fx s/p hemiarthroplasty             -patient may  shower with covering incision             -ELOS/Goals: 10-12 days mod I to supervision  Grounds pass ordered   -Est discharge 6/3  F/u with ortho and PM&R upon discharge.    2.  Impaired mobility, therapy notes reviewed and ambulating 125 feet -DVT/anticoagulation:  SCDs/TEDs/ASA              -antiplatelet therapy: ASA 81 mg po BID  3. Pain Management: Hydrocodone prn vs tylenol prn. Recommended avoiding exercises that aggravate her pain  4. Mood/Behavior/Sleep: LCSW to follow for evaluation and support.              -antipsychotic agents: N/A  5. Neuropsych/cognition: This patient is capable of making decisions on her own behalf.  6. Skin/Wound Care: Routine pressure relief measures.   7. Fluids/Electrolytes/Nutrition:  Monitor I/O.   8. Left hip subcapital femoral neck  fx s/p anterior hemi: WBAT. Continue pot-op dressing until outpatient ortho f/u  9. Aspiration PNA: Continues to have frequent cough-->will check f/u CXR to rule out  underlying overload.             --Continue Unasyn D# 3/5-7?             -con't 2L O2 by [place]- wean as tolerated  -tolerating RA this afternoon 10. Severe GERD: On Protonix IV BID-->will change to oral route 11. Chronic dysphagia/Esophageal stricture: Is able to modify diet             --last swallow study without evidence of aspiration and no change in  stricture for past 4 years             --does not like Ensure--make her own shakes at home. Will try [employer] supplement. Pt CAN use nectar thick liquids if she wants, however can stay on thin liquids.   -5/24 GI was contacted yesterday, no plans for EGD unless MBS shows something new.  She had MBS today,  SLP continuing dys 3/thin although she will need to avoid most solid foods 12. CAF: Monitor HR TID--continue Tambocor  HR controlled Vitals:   04/01/23 1910 04/02/23 0510  BP: (!) 123/52 123/61  Pulse: 90 88  Resp: 18 18  Temp: 97.8 F (36.6 C) 98 F (36.7 C)  SpO2: 94% 94%    13. ABLA: Recheck CBC in am. 14. Fluid overload: Monitor weight daily. Encourage low salt diet. Current weight reviewed and is 18.29  -Wt appears stable   Filed Weights   03/31/23 0641 04/01/23 0842 04/02/23 0513  Weight: 52.1 kg 52.2 kg 51 kg    15. Underweight: provided dietary education  -5/24 Nutrition adding ice cream to meals  16. Difficulty swallowing 2/2 chronic esophageal stricture: SLP ordered, GI consulted  17. Small left pleural effusion: 20mg  IV Lasix ordered  18. Suboptimal vitamin D: liquid vitamin D ordered   19. Reduced TIBC: daily iron supplement ordered  20. Insomnia  -5/24 will try trazodone PRN HS, DC melatonin   LOS: 6 days A FACE TO FACE EVALUATION WAS PERFORMED  Drema Pry Neveyah Garzon 04/02/2023, 9:36 AM

## 2023-04-02 NOTE — Plan of Care (Signed)
  Problem: Consults Goal: RH GENERAL PATIENT EDUCATION Description: See Patient Education module for education specifics. Outcome: Progressing   Problem: RH BLADDER ELIMINATION Goal: RH STG MANAGE BLADDER WITH ASSISTANCE Description: STG Manage Bladder With toileting Assistance Outcome: Progressing   Problem: RH SKIN INTEGRITY Goal: RH STG ABLE TO PERFORM INCISION/WOUND CARE W/ASSISTANCE Description: STG Able To Perform Incision/Wound Care With min Assistance. Outcome: Progressing   Problem: RH SAFETY Goal: RH STG ADHERE TO SAFETY PRECAUTIONS W/ASSISTANCE/DEVICE Description: STG Adhere to Safety Precautions With cues Assistance/Device. Outcome: Progressing   Problem: RH PAIN MANAGEMENT Goal: RH STG PAIN MANAGED AT OR BELOW PT'S PAIN GOAL Description: < 4 with prns Outcome: Progressing   Problem: RH KNOWLEDGE DEFICIT GENERAL Goal: RH STG INCREASE KNOWLEDGE OF SELF CARE AFTER HOSPITALIZATION Description: Patient and family will be able to manage care at discharge with medications and dietary modifications using educational resources independently Outcome: Progressing   Problem: Education: Goal: Knowledge of the prescribed therapeutic regimen will improve Outcome: Progressing Goal: Understanding of discharge needs will improve Outcome: Progressing Goal: Individualized Educational Video(s) Outcome: Progressing   Problem: Activity: Goal: Ability to avoid complications of mobility impairment will improve Outcome: Progressing Goal: Ability to tolerate increased activity will improve Outcome: Progressing   Problem: Clinical Measurements: Goal: Postoperative complications will be avoided or minimized Outcome: Progressing   Problem: Pain Management: Goal: Pain level will decrease with appropriate interventions Outcome: Progressing   Problem: Skin Integrity: Goal: Will show signs of wound healing Outcome: Progressing

## 2023-04-02 NOTE — Progress Notes (Signed)
Occupational Therapy Session Note  Patient Details  Name: Michelle Vazquez MRN: [id number] Date of Birth: 20-Aug-1929  Today's Date: 04/02/2023 OT Individual Time: 4098-1191 OT Individual Time Calculation (min): 70 min    Short Term Goals: Week 1:  OT Short Term Goal 1 (Week 1): Pt will complete sit > stand with CGA using LRAD in prep for ADL task OT Short Term Goal 2 (Week 1): Pt will complete toilet transfer with CGA using LRAD OT Short Term Goal 3 (Week 1): Pt will use AE PRN to thread LB clothing with supervision  Skilled Therapeutic Interventions/Progress Updates:  Skilled OT intervention completed with focus on family/DME education, and DC planning. Pt received in bathroom, agreeable to session. Unrated pain reported in L hip; nurse notified of med request and present at start of session. OT offered rest breaks and repositioning throughout for pain reduction.  Threaded brief in standing with total A for time, supervision for wiping and donning pants over hips. Ambulated with CGA using RW to sink for supervised hand hygiene in standing then ambulated to w/c.  Pt's granddaughter, Michelle Vazquez, present with extensive questions about certain DME/DC plans therefore utilized session to discuss plans and determine DME needed far safety/independence at home.  Discussed potential use of shower chair with back/arm rests vs BSC for energy conservation and efficiency with standing if no grab bars present in step in shower at home. Pt does not prefer BSC but is to get back to OT about potential need of this as bathroom is in walkable distance but family prefers her to have one. Discussed pt's HOB adjustable bed at home for comfort with pt's swallowing/throat as pt unable to tolerate supine, with Michelle Vazquez unsure if bed can be lowered closer to floor. Reviewed options if the current bed is needed, ways to access the bed such as with step stool with hand rail. Pt ambulated with CGA about 25 ft using RW on the way to ADL  apartment however pt fatigue reported and request to ride as PT session after this current session.   OT demonstrated back step method using RW then transfer of hands to side bed rail for safety when lowering to EOB. Pt was able to return demo CGA level ambulatory transfer to back up to stool, then with min cues on leading with good leg, able to back step and sit EOB with CGA. CGA to stand from EOB using bed rail to transition to RW then min cues to descend with bad leg. Practice 3 times with same assist almost at close supervision level. Discussed bed mobility options for increased independence, as well as suggested specific step stool DME off online with Michelle Vazquez present to engage in plan. CGA ambulatory transfer back to w/c, then dependent transport back to room.   Michelle Vazquez with questions regarding if manual w/c is appropriate for community level mobility; OT educated on transport chair options but notified primary PT to discuss in upcoming session due to OT time constraint. Pt remained seated in w/c, with Michelle Vazquez present and with all needs in reach at end of session.   Therapy Documentation Precautions:  Precautions Precautions: Fall Precaution Comments: monitor O2 Restrictions Weight Bearing Restrictions: Yes LLE Weight Bearing: Weight bearing as tolerated    Therapy/Group: Individual Therapy  Melvyn Novas, MS, OTR/L  04/02/2023, 3:45 PM

## 2023-04-02 NOTE — Progress Notes (Signed)
Occupational Therapy Session Note  Patient Details  Name: Michelle Vazquez MRN: [id number] Date of Birth: October 04, 1929  Today's Date: 04/02/2023 OT Individual Time: 1131-1200 OT Individual Time Calculation (min): 29 min    Short Term Goals: Week 1:  OT Short Term Goal 1 (Week 1): Pt will complete sit > stand with CGA using LRAD in prep for ADL task OT Short Term Goal 2 (Week 1): Pt will complete toilet transfer with CGA using LRAD OT Short Term Goal 3 (Week 1): Pt will use AE PRN to thread LB clothing with supervision  Skilled Therapeutic Interventions/Progress Updates:    Patient agreeable to participate in OT session. Reports 0/10 pain while seated in chair. Reports hip pain when working with PT earlier today.   Patient participated in skilled OT session focusing on UB strengthening and endurance. Therapist educated on proper form and technique during UB exercises in order to improve overall UB strength and endurance required when completing bathing and dressing tasks.    Strengthening: - BUE, seated, 4lb dowel rod, overhead press, shoulder flexion (to 90*), chest press, rowing (forward/backwards), horizontal abduction/adduction; 1 set, 10X Rest breaks taken as needed. Pt was able to self monitor needed independently.     Therapy Documentation Precautions:  Precautions Precautions: Fall Precaution Comments: monitor O2 Restrictions Weight Bearing Restrictions: Yes LLE Weight Bearing: Weight bearing as tolerated   Therapy/Group: Individual Therapy  [name], OTR/L,CBIS  Supplemental OT - [facility] and [facility] Secure Chat Preferred   04/02/2023, 7:59 AM

## 2023-04-02 NOTE — Progress Notes (Signed)
Physical Therapy Session Note  Patient Details  Name: Michelle Vazquez MRN: [id number] Date of Birth: 06-01-1929  Today's Date: 04/02/2023 PT Individual Time: [phone] and 4782-9562 PT Individual Time Calculation (min): 55 min and 41 min  Short Term Goals: Week 1:  PT Short Term Goal 1 (Week 1): pt will transfer bed<>chair with LRAD and CGA PT Short Term Goal 2 (Week 1): pt will ambulate 26ft with LRAD and CGA PT Short Term Goal 3 (Week 1): pt will navigate 4 steps with 1 handrail and min A  Skilled Therapeutic Interventions/Progress Updates:   Treatment Session 1 Received pt sitting in [facility] on phone with her granddaughter. Pt agreeable to PT treatment and reported soreness, rated 7/10, in L hip (premedicated) that improved by end of session. Session with emphasis on functional mobility/transfers, toileting, generalized strengthening and endurance, and gait training. Pt performed all transfers with RW and close supervision throughout session. Pt reported urge to void and ambulated in/out of bathroom with RW and close supervision/CGA. Pt able to manage clothing with close supervision, void, and performed peri-care without assist. Pt incontinent - removed soiled brief and donned clean one in standing and pt stood at sink to perform hand hygiene with supervision. Pt then ambulated 126ft with RW and close supervision to main therapy gym. Pt ambulates at decreased cadence, with antalgic gait pattern and decreased L foot clearance, and flexed trunk/downward gaze - cues to relax shoulders and for upright posture. Pt then performed 2x5 L hip flexion AROM and 2x10 L knee extension AROM during active seated rest break. Returned to room and concluded session with pt sitting in WC, needs within reach, and seatbelt alarm on.   Treatment Session 2 Received pt sitting in [facility] with granddaughter [name] present for family education training. Pt agreeable to PT treatment and reported pain 7/10 in L hip - RN notified and  present to administer pain medication. Pt required increased time to take medication, coughing when taking liquid Tylenol. Increased time spent discussing equipment for D/C as [name] had questions regarding WC. Pt has RW at home and therapist recommended pt get a walker bag and reacher to use upon discharge. Pt performed all transfers with RW and supervision throughout session. Pt ambulated 41ft x 2 trials with RW and supervision to/from ortho gym. Discussed having [name] come in for more family education tomorrow to observe stair navigation and car transfers. Returned to room and concluded session with pt sitting in [facility] with all needs within reach.   Therapy Documentation Precautions:  Precautions Precautions: Fall Precaution Comments: monitor O2 Restrictions Weight Bearing Restrictions: Yes LLE Weight Bearing: Weight bearing as tolerated  Therapy/Group: Individual Therapy [name] PT, DPT 04/02/2023, 6:50 AM

## 2023-04-03 ENCOUNTER — Telehealth: Payer: Self-pay

## 2023-04-03 DIAGNOSIS — S72002S Fracture of unspecified part of neck of left femur, sequela: Secondary | ICD-10-CM | POA: Diagnosis not present

## 2023-04-03 MED ORDER — DM-GUAIFENESIN ER 30-600 MG PO TB12
1.0000 | ORAL_TABLET | Freq: Two times a day (BID) | ORAL | Status: DC
  Administered 2023-04-03 – 2023-04-04 (×3): 1 via ORAL
  Filled 2023-04-03 (×3): qty 1

## 2023-04-03 NOTE — Progress Notes (Signed)
Occupational Therapy Session Note  Patient Details  Name: Michelle Vazquez MRN: [id number] Date of Birth: [birth year]  Today's Date: 04/03/2023 OT Individual Time: 1006-1101 & 1420-1530 OT Individual Time Calculation (min): 55 min & 70 min   Short Term Goals: Week 1:  OT Short Term Goal 1 (Week 1): Pt will complete sit > stand with CGA using LRAD in prep for ADL task OT Short Term Goal 2 (Week 1): Pt will complete toilet transfer with CGA using LRAD OT Short Term Goal 3 (Week 1): Pt will use AE PRN to thread LB clothing with supervision  Skilled Therapeutic Interventions/Progress Updates:  Session 1 Skilled OT intervention completed with focus on toileting needs, edema management/education, ambulatory transfers, DME education. Pt received seated in w/c with granddaughter present, agreeable to session. No pain reported however did indicate tight/pulling sensation in leg with noted edema in LLE along thigh/knee > ankle aspect.  Completed supervision sit > stand using RW then supervision ambulatory transfer to [facility] over commode. Able to manage all toileting steps with close supervision for continent void. Ambulated to sink, washed hands in stance and ambulated back to w/c with close supervision with RW. Suggested pt trial thigh high TED for compression/edema management, and pt agreeable. Donned on LLE with max A, with pt able to stand and donn to hips with supervision.  [name], granddaughter, expressed that rug under pt's bed at home is high plush and is concerned her RW won't glide safely. OT retrieved walker ball for demo and education provided on ways to purchase similar/such items for her personal walker at home to trial.  Transported dependently in w/c > gym for time. Then supervision stand pivot to nustep in prep for LLE ROM/edema management. Completed 10 mins on level 2 for focus on ROM vs heavy resistance. Pt requested to complete an additional 2 mins due to liking of the machine. Pt verbalized  feeling like the activity "loosened the leg and will make walking easier."  Pt then ambulated from ortho gym back to room with close supervision using RW. Increased comfort reported, with OT suggesting she trial nustep in prep for solely working on gait for increased comfort/efficiency. Pt remained seated in w/c, with granddaughter present, and with all needs in reach at end of session.  Session 2 Skilled OT intervention completed with focus on ADL retraining and family education within a shower context. Pt received seated in w/c with granddaughter [name] present, agreeable to session. No pain reported.  Pt agreeable to shower as [name] present and could receive training on how to assist with shower at DC as the primary caregiver. Completed all sit > stands with/without AD with supervision, and ambulatory transfers using RW with supervision during session.  Ambulated to TTB in shower, side stepped without AD to grab bar with CGA. Surgical bandage still present therefore OT applied waterproof cover to L hip prior to shower. MD consulted about expected time for bandage to be removed in order to prepare pt/family about covering methods if DC sooner than it being removed. Also notified of pt concern about amount of blood underneath bandage (> 3/4 of the bandage surface).  Able to shower all at the supervision level including in stance for periareas using grab bar for balance. Supervision side step to RW then ambulated to [facility] as pt will dress on commode at home. Education provided on use of reacher and sock aid for LB dressing; pt then able to thread brief/pants with min cues, overall supervision at the sit >  stand level then donn R leg into sock with min A, total A for LLE due to PA present for assessment of surgical bandage.  Discussed with [name] pt's bathroom at home; pt would have to side step into bathroom with RW due to narrow width, therefore OT switched roller legs on RW for increased space and applied  trial of walker balls to back legs from ease of gliding. Pt trialed with supervision 5 steps forward then back and reported appropriate function.   Pt remained seated in w/c, with [name] present and with all needs in reach at end of session.   Therapy Documentation Precautions:  Precautions Precautions: Fall Precaution Comments: monitor O2 Restrictions Weight Bearing Restrictions: Yes LLE Weight Bearing: Weight bearing as tolerated    Therapy/Group: Individual Therapy  Melvyn Novas, MS, OTR/L  04/03/2023, 3:52 PM

## 2023-04-03 NOTE — Plan of Care (Signed)
  Problem: Consults Goal: RH GENERAL PATIENT EDUCATION Description: See Patient Education module for education specifics. Outcome: Progressing   Problem: RH BLADDER ELIMINATION Goal: RH STG MANAGE BLADDER WITH ASSISTANCE Description: STG Manage Bladder With toileting Assistance Outcome: Progressing   Problem: RH SKIN INTEGRITY Goal: RH STG ABLE TO PERFORM INCISION/WOUND CARE W/ASSISTANCE Description: STG Able To Perform Incision/Wound Care With min Assistance. Outcome: Progressing   Problem: RH SAFETY Goal: RH STG ADHERE TO SAFETY PRECAUTIONS W/ASSISTANCE/DEVICE Description: STG Adhere to Safety Precautions With cues Assistance/Device. Outcome: Progressing   Problem: RH PAIN MANAGEMENT Goal: RH STG PAIN MANAGED AT OR BELOW PT'S PAIN GOAL Description: < 4 with prns Outcome: Progressing   Problem: RH KNOWLEDGE DEFICIT GENERAL Goal: RH STG INCREASE KNOWLEDGE OF SELF CARE AFTER HOSPITALIZATION Description: Patient and family will be able to manage care at discharge with medications and dietary modifications using educational resources independently Outcome: Progressing   Problem: Education: Goal: Knowledge of the prescribed therapeutic regimen will improve Outcome: Progressing Goal: Understanding of discharge needs will improve Outcome: Progressing Goal: Individualized Educational Video(s) Outcome: Progressing   Problem: Activity: Goal: Ability to avoid complications of mobility impairment will improve Outcome: Progressing Goal: Ability to tolerate increased activity will improve Outcome: Progressing   Problem: Clinical Measurements: Goal: Postoperative complications will be avoided or minimized Outcome: Progressing   Problem: Pain Management: Goal: Pain level will decrease with appropriate interventions Outcome: Progressing   Problem: Skin Integrity: Goal: Will show signs of wound healing Outcome: Progressing   

## 2023-04-03 NOTE — Telephone Encounter (Signed)
Called, bandage looks good, no signs of infection. Advised ok to leave it on until followup on Monday. She stated understanding

## 2023-04-03 NOTE — Progress Notes (Signed)
Physical Therapy Session Note  Patient Details  Name: Michelle Vazquez MRN: [id number] Date of Birth: 03-Jan-1929  Today's Date: 04/03/2023 PT Individual Time: 0731-0812 PT Individual Time Calculation (min): 41 min   Short Term Goals: Week 1:  PT Short Term Goal 1 (Week 1): pt will transfer bed<>chair with LRAD and CGA PT Short Term Goal 2 (Week 1): pt will ambulate 98ft with LRAD and CGA PT Short Term Goal 3 (Week 1): pt will navigate 4 steps with 1 handrail and min A  Skilled Therapeutic Interventions/Progress Updates:   Received pt sitting in recliner with graddaughter [name] present for family education. Pt agreeable to PT treatment and reported pain in L hip with standing/weight bearing but did not rate severity and already received pain medication. Session with emphasis on discharge planning, functional mobility/transfers, generalized strengthening and endurance, stair navigation, simulated car transfers, and ambulation. Pt performed all transfers with RW and supervision throughout session. Pt transported to/from room in [facility] dependently. Pt ambulated to staircase and navigated 4 6in steps with L handrail using lateral stepping technique with CGA provided by [name] to simulate home entry. Educated [name] on Psychiatrist and hand placement with stair navigation, as well as to assist pt by bringing RW up/down the steps for pt.  In ortho gym, pt performed simulated car transfer with RW and supervision provided by [name], using UEs to assist getting LLE in/out of car. Pt then ambulated 20ft on uneven surfaces (ramp and mulch) with RW and close supervision and descended 1 5in curb with RW and CGA with cues to descend with LLE and for RW management. Returned to room and concluded session with pt sitting in [facility] with all needs within reach. Cleared [name] to assist with transfers and toileting - safety plan updated.   Therapy Documentation Precautions:  Precautions Precautions: Fall Precaution Comments:  monitor O2 Restrictions Weight Bearing Restrictions: Yes LLE Weight Bearing: Weight bearing as tolerated  Therapy/Group: Individual Therapy [name] PT, DPT 04/03/2023, 6:49 AM

## 2023-04-03 NOTE — Telephone Encounter (Signed)
Michelle Vazquez with [facility].called stating that patient will be there until 04/09/2023 and would like to know if dressing should be changed or will Dr. Magnus Ivan see patient before she gets discharged.  Patient had left hip surgery on 03/23/2023.  Rm# [id number]  Cb# [phone].  Please advise.

## 2023-04-03 NOTE — Progress Notes (Signed)
Speech Language Pathology Discharge Summary  Patient Details  Name: Michelle Vazquez MRN: [id number] Date of Birth: 1929/01/29  Date of Discharge from SLP service:Apr 03, 2023  Today's Date: 04/03/2023 SLP Individual Time: 1130-1200 SLP Individual Time Calculation (min): 30 min   Skilled Therapeutic Interventions:   Skilled treatment session focused on completion of education with the patient and her granddaughter. SLP facilitated session by providing education regarding patient's current swallowing function, diet recommendations, appropriate textures, swallowing compensatory strategies and methods for medication administration. Both verbalized understanding of all information and handouts were also given to reinforce education. Both the patient and her granddaughter have been managing her esophageal dysphagia prior to admission and verbalize/demonstrate a great understanding regarding her current deficits and strategies/textures that are the safest for PO intake. Patient left upright in wheelchair with granddaughter present.   Patient has met 1 of 1 long term goals.  Patient to discharge at overall Modified Independent level.   Reasons goals not met: N/A   Clinical Impression/Discharge Summary: Patient has met all LTGs this admission. Currently, patient is overall Mod I for use of swallowing compensatory strategies with current diet of Dys. 1 textures with thin liquids. Patient has an extensive history of esophageal issues impacting swallowing function which she manages independently. All education is complete and the patient is at her baseline level of swallowing function, therefore, she will be discharged from skilled SLP intervention with f/u not warranted at this time. Patient and granddaughter verbalized understanding and agreement.   Care Partner:  Caregiver Able to Provide Assistance: Yes  Type of Caregiver Assistance: Physical;Cognitive  Recommendation:  None      Equipment: N/A    Reasons for discharge: Treatment goals met   Patient/Family Agrees with Progress Made and Goals Achieved: Yes    Shaul Trautman 04/03/2023, 1:34 PM

## 2023-04-03 NOTE — Progress Notes (Signed)
PROGRESS NOTE   Subjective/Complaints: No new complaints this morning Granddaughter is at bedside She asks regarding status of her pneumonia, continues to have chronic congestion   ROS +cough, +difficulty swallowing, + insomnia, denies HA, SOB,  + hip pain-controlled, worse with ambulation +chronic congestion  Objective:   No results found. Recent Labs    04/02/23 0536  WBC 8.6  HGB 9.8*  HCT 30.0*  PLT 370   Recent Labs    04/02/23 0536  NA 137  K 4.3  CL 100  CO2 30  GLUCOSE 98  BUN 23  CREATININE 0.88  CALCIUM 8.6*    Intake/Output Summary (Last 24 hours) at 04/03/2023 0955 Last data filed at 04/03/2023 0743 Gross per 24 hour  Intake 473 ml  Output --  Net 473 ml        Physical Exam: Vital Signs Blood pressure 139/69, pulse 94, temperature 98.3 F (36.8 C), temperature source Oral, resp. rate 18, height 5\' 1"  (1.549 m), weight 48.2 kg, SpO2 94 %. Constitutional:   Gen: no distress, normal appearing, BMI 20.08 HEENT: oral mucosa pink and moist, NCAT Cardio: Reg rate Chest: normal effort, normal rate of breathing Abd: soft, non-distended Ext: no edema Psych: pleasant, normal affect Skin: intact Musculoskeletal:     Cervical back: Neck supple. No tenderness.     Comments: Left hip incision with surgical foam dressing.   UE strength 5-/5 throughout B/L RLE 5-/5 and LLE 3-/5 in HF, otherwise 4-/5 - pain inhibits knee ext  Poor left foot clearance Skin:    General: Skin is warm and dry.  Neurological:     Mental Status: She is alert and oriented to person, place, and time.     Comments: Intact to light touch in all  4 extremities  Psychiatric:        Mood and Affect: Mood normal.        Behavior: Behavior normal.    Assessment/Plan: 1. Functional deficits which require 3+ hours per day of interdisciplinary therapy in a comprehensive inpatient rehab setting. Physiatrist is providing  close team supervision and 24 hour management of active medical problems listed below. Physiatrist and rehab team continue to assess barriers to discharge/monitor patient progress toward functional and medical goals  Care Tool:  Bathing    Body parts bathed by patient: Right arm, Left arm, Chest, Abdomen, Right upper leg, Left upper leg, Right lower leg, Face, Front perineal area, Buttocks   Body parts bathed by helper: Front perineal area, Buttocks Body parts n/a: Left lower leg   Bathing assist Assist Level: Minimal Assistance - Patient > 75%     Upper Body Dressing/Undressing Upper body dressing   What is the patient wearing?: Pull over shirt    Upper body assist Assist Level: Set up assist    Lower Body Dressing/Undressing Lower body dressing      What is the patient wearing?: Pants, Incontinence brief     Lower body assist Assist for lower body dressing: Moderate Assistance - Patient 50 - 74%     Toileting Toileting    Toileting assist Assist for toileting: Contact Guard/Touching assist Assistive Device Comment: walker   Transfers Chair/bed transfer  Transfers assist     Chair/bed transfer assist level: Supervision/Verbal cueing     Locomotion Ambulation   Ambulation assist      Assist level: Supervision/Verbal cueing Assistive device: Walker-rolling Max distance: 181ft   Walk 10 feet activity   Assist     Assist level: Supervision/Verbal cueing Assistive device: Walker-rolling   Walk 50 feet activity   Assist Walk 50 feet with 2 turns activity did not occur: Safety/medical concerns (weakness/deconditioning, fatigue)  Assist level: Supervision/Verbal cueing Assistive device: Walker-rolling    Walk 150 feet activity   Assist Walk 150 feet activity did not occur: Safety/medical concerns (weakness/deconditioning, fatigue)         Walk 10 feet on uneven surface  activity   Assist Walk 10 feet on uneven surfaces activity did not  occur: Safety/medical concerns (weakness/deconditioning, fatigue)   Assist level: Supervision/Verbal cueing Assistive device: Walker-rolling   Wheelchair     Assist Is the patient using a wheelchair?: Yes Type of Wheelchair: Manual    Wheelchair assist level: Dependent - Patient 0% Max wheelchair distance: 74ft    Wheelchair 50 feet with 2 turns activity    Assist    Wheelchair 50 feet with 2 turns activity did not occur:  (weakness/deconditioning, fatigue)   Assist Level: Dependent - Patient 0%   Wheelchair 150 feet activity     Assist  Wheelchair 150 feet activity did not occur:  (weakness/deconditioning, fatigue)   Assist Level: Dependent - Patient 0%   Blood pressure 139/69, pulse 94, temperature 98.3 F (36.8 C), temperature source Oral, resp. rate 18, height 5\' 1"  (1.549 m), weight 48.2 kg, SpO2 94 %.    Medical Problem List and Plan: 1. Functional deficits secondary to L femoral neck fx s/p hemiarthroplasty             -patient may  shower with covering incision             -ELOS/Goals: 10-12 days mod I to supervision  Grounds pass ordered   -Est discharge 6/3  F/u with ortho and PM&R upon discharge. Will discuss with ortho whether they would like to remove her dressing outpatient or if we an remove her dressing here  Commended patient on her excellent progress!   2.  Impaired mobility, therapy notes reviewed and ambulating 125 feet -DVT/anticoagulation:  SCDs/TEDs/ASA              -antiplatelet therapy: ASA 81 mg po BID  3. Pain Management: Hydrocodone prn vs tylenol prn. Recommended avoiding exercises that aggravate her pain. Provided with pain relief journal  4. Mood/Behavior/Sleep: LCSW to follow for evaluation and support.              -antipsychotic agents: N/A  5. Neuropsych/cognition: This patient is capable of making decisions on her own behalf.  6. Skin/Wound Care: Routine pressure relief measures.   7. Fluids/Electrolytes/Nutrition:   Monitor I/O.   8. Left hip subcapital femoral neck  fx s/p anterior hemi: WBAT. Continue pot-op dressing until outpatient ortho f/u  9. Aspiration PNA: Continues to have frequent cough-->daily weights ordered to r/o fluid overload             --Continue Unasyn D# 3/5-7?             -con't 2L O2 by [place]- wean as tolerated  -discussed signs and symptoms of a pneumonia that she should be aware of  -tolerating RA this afternoon 10. Severe GERD: On Protonix IV BID-->will change to oral route 11.  Chronic dysphagia/Esophageal stricture: Is able to modify diet             --last swallow study without evidence of aspiration and no change in stricture for past 4 years             --does not like Ensure--make her own shakes at home. Will try [employer] supplement. Pt CAN use nectar thick liquids if she wants, however can stay on thin liquids.   -5/24 GI was contacted yesterday, no plans for EGD unless MBS shows something new.  She had MBS today,  SLP continuing dys 3/thin although she will need to avoid most solid foods 12. CAF: Monitor HR TID--continue Tambocor  HR controlled Vitals:   04/02/23 2130 04/03/23 0612  BP: (!) 117/55 139/69  Pulse: 95 94  Resp: 18 18  Temp: 98.4 F (36.9 C) 98.3 F (36.8 C)  SpO2: 94% 94%    13. ABLA: Recheck CBC in am. 14. Fluid overload: Monitor weight daily. Encourage low salt diet. Current weight reviewed and is 18.29  -Wt appears stable   Filed Weights   04/01/23 0842 04/02/23 0513 04/03/23 0612  Weight: 52.2 kg 51 kg 48.2 kg    15. Underweight: provided dietary education  -5/24 Nutrition adding ice cream to meals  16. Difficulty swallowing 2/2 chronic esophageal stricture: SLP ordered, GI consulted  17. Small left pleural effusion: 20mg  IV Lasix ordered. Weight reviewed 5/28 and is 48.2kg  18. Suboptimal vitamin D: liquid vitamin D ordered   19. Reduced TIBC: daily iron supplement ordered  20. Insomnia  -5/24 will try trazodone PRN HS, DC  melatonin  21. Chronic congestion: Mucinex ordered  >50 minutes spent in discussion of patient's chronic congestion, ordering Mucinex, commending her on her excellent progress, provided her with a pain relief journal, discussed signs and symptoms of a pneumonia that she should be aware of   LOS: 7 days A FACE TO FACE EVALUATION WAS PERFORMED  Drema Pry Ahlivia Salahuddin 04/03/2023, 9:55 AM

## 2023-04-03 NOTE — Plan of Care (Signed)
  Problem: RH Swallowing Goal: LTG Patient will consume least restrictive diet using compensatory strategies with assistance (SLP) Description: LTG:  Patient will consume least restrictive diet using compensatory strategies with assistance (SLP) Outcome: Completed/Met   

## 2023-04-04 DIAGNOSIS — S72002S Fracture of unspecified part of neck of left femur, sequela: Secondary | ICD-10-CM | POA: Diagnosis not present

## 2023-04-04 MED ORDER — FERROUS SULFATE 300 (60 FE) MG/5ML PO SOLN
300.0000 mg | Freq: Every day | ORAL | Status: DC
  Administered 2023-04-05 – 2023-04-08 (×4): 300 mg via ORAL
  Filled 2023-04-04 (×4): qty 5

## 2023-04-04 NOTE — Progress Notes (Signed)
Physical Therapy Session Note  Patient Details  Name: Michelle Vazquez MRN: [id number] Date of Birth: [birth year]  Today's Date: 04/04/2023 PT Individual Time: 0800-0910 PT Individual Time Calculation (min): 70 min   Short Term Goals: Week 1:  PT Short Term Goal 1 (Week 1): pt will transfer bed<>chair with LRAD and CGA PT Short Term Goal 2 (Week 1): pt will ambulate 38ft with LRAD and CGA PT Short Term Goal 3 (Week 1): pt will navigate 4 steps with 1 handrail and min A  Skilled Therapeutic Interventions/Progress Updates:    Chart reviewed and pt agreeable to therapy. Pt received seated in WC with c/o pain in L hip that pt did not quantify but noted a feeling of aching and fatigue. Also of note, RN present to administer morning medication. Session focused on discharge planning, amb quality and independence and endurance to promote safe home access. Pt initiated session with discussion of discharge preparation while pt completed medication administration. Pt then taken to therapy gym for time. Pt then completed 5 mins on workload 1 of NuStep to facilitate increased ROM in L hip. Pt then completed 10 mins interval training at workloads 3-10 to promote endurance with pt able to continue entire exercise without rest break.  Pt also completed 40ft + 55ft amb using S + RW with noted antalgic gait that improved over session after medication administration. Pt also noted to still have decreased L hip fl during amb. Pt then completed standing marches with emphasis on high knee raising, with pt limited to raising L knee. Pt then returned to room with S + RW for amb and SBA for toileting. At end of session, pt was left seated in [facility] with alarm engaged, nurse call bell and all needs in reach.     Therapy Documentation Precautions:  Precautions Precautions: Fall Precaution Comments: monitor O2 Restrictions Weight Bearing Restrictions: Yes LLE Weight Bearing: Weight bearing as tolerated     Therapy/Group:  Individual Therapy  Dionne Milo, PT, DPT 04/04/2023, 11:17 AM

## 2023-04-04 NOTE — Progress Notes (Signed)
Physical Therapy Session Note  Patient Details  Name: Michelle Vazquez MRN: [id number] Date of Birth: 1928/12/09  Today's Date: 04/04/2023 PT Individual Time: 1400-1457 PT Individual Time Calculation (min): 57 min   Short Term Goals: Week 1:  PT Short Term Goal 1 (Week 1): pt will transfer bed<>chair with LRAD and CGA PT Short Term Goal 2 (Week 1): pt will ambulate 1ft with LRAD and CGA PT Short Term Goal 3 (Week 1): pt will navigate 4 steps with 1 handrail and min A  Skilled Therapeutic Interventions/Progress Updates:   Received pt sitting in WC, pt agreeable to PT treatment, and did not state pain level during session but reported soreness in L hip. Session with emphasis on functional mobility/transfers, generalized strengthening and endurance, dynamic standing balance/coordination, and gait training. Pt transported to/form room in [facility] dependently for time management purposes. Pt performed all transfers with RW and supervision throughout session. Pt transferred sit<>supine on mat with supervision and performed the following exercises with emphasis on hip ROM/strengthening: -L active assisted heel slides 2x10 -L active assisted hip abduction 2x10 -bridges 2x8 -L active assisted SLR 2x8 -glute squeezes x10 with 5 second isometric hold   Pt transferred supine<>sitting EOM with supervision and provided pt with HEP and educated on frequency/duration/technique for the following exercises: - Supine Bridge  - 1 x daily - 7 x weekly - 3 sets - 10 reps - Supine Hip Abduction AROM  - 1 x daily - 7 x weekly - 3 sets - 10 reps - Supine Heel Slide  - 1 x daily - 7 x weekly - 3 sets - 10 reps - Supine Gluteal Sets  - 1 x daily - 7 x weekly - 3 sets - 10 reps - 5 hold - Supine Straight Leg Raises  - 1 x daily - 7 x weekly - 3 sets - 10 reps - Standing March with Counter Support  - 1 x daily - 7 x weekly - 3 sets - 10 reps - Standing Hip Abduction with Counter Support  - 1 x daily - 7 x weekly - 3 sets -  10 reps - Mini Squat with Counter Support  - 1 x daily - 7 x weekly - 3 sets - 10 reps Pt ambulated 176ft with RW and supervision back towards room - transported remainder of way due to fatigue. Concluded session with pt sitting in recliner on Roho cushion, needs within reach, and chair pad alarm on.   Therapy Documentation Precautions:  Precautions Precautions: Fall Precaution Comments: monitor O2 Restrictions Weight Bearing Restrictions: Yes LLE Weight Bearing: Weight bearing as tolerated  Therapy/Group: Individual Therapy [name] PT, DPT 04/04/2023, 6:49 AM

## 2023-04-04 NOTE — Patient Care Conference (Signed)
Inpatient RehabilitationTeam Conference and Plan of Care Update Date: 04/04/2023   Time: 11:50 AM    Patient Name: Michelle Vazquez      Medical Record Number: [id number]  Date of Birth: Sep 18, 1929 Sex: Female         Room/Bed: 4M03C/4M03C-01 Payor Info: Payor: MEDICARE / Plan: MEDICARE PART A AND B / Product Type: *No Product type* /    Admit Date/Time:  03/27/2023 12:32 PM  Primary Diagnosis:  Closed left hip fracture, sequela  Hospital Problems: Principal Problem:   Closed left hip fracture, sequela Active Problems:   Pharyngoesophageal dysphagia   Cough   Hypertension   CKD (chronic kidney disease) stage 3, GFR 30-59 ml/min (HCC)   Esophageal stenosis   Paroxysmal atrial fibrillation [facility])    Expected Discharge Date: Expected Discharge Date: 04/08/23  Team Members Present: Physician leading conference: Dr. Sula Soda Social Worker Present: Lavera Guise, BSW Nurse Present: Chana Bode, RN PT Present: Raechel Chute, PT OT Present: Candee Furbish, OT SLP Present: Other (comment);Ulice Brilliant, SLP (Fae Pippin, SLP) PPS Coordinator present : Fae Pippin, SLP     Current Status/Progress Goal Weekly Team Focus  Bowel/Bladder   continent B&B with stress incontinence due to coughing LBM 04/03/23   Remain continent, continue bladder program for stress incontinence   Assist with toileting q4 while awake and PRN    Swallow/Nutrition/ Hydration   Baseline esophageal dysphagia, Dys. 1 texturs with thin liquids, Mod I   Mod I  Patient and family education complete, patient discharged from skilled SLP intervention    ADL's   Set up A UB, Min A LB LB bathing/dressing, Min A toileting   Mod I/supervision   ADL retraining, standing balance, weight shifting, endurance, family ed, DC planning    Mobility   supine<>sit with supervision and use of bed features, transfers with RW and close supervision, gait 153ft with RW and close supervision, 4 6in steps with L  handrail and min A - on RA   supervision/mod I  functional mobility/transfers, generalized strengthening and endurance, ROM, family education, dynamic standing balance/coordination, gait training, D/C planning, and DME    Communication                Safety/Cognition/ Behavioral Observations               Pain   Pain controlled with PRN medications   Pain will be controlled with PRN medications   Assess pain qshift and PRN    Skin   Sx incision to L hip, dressing intact   wound will remain free of infection  Assess skin qshift and PRN      Discharge Planning:  Discharging home with  assistance from son and daughter. 4 steps to enter.   Team Discussion: Patient post left hip fracture with history of dysphagia (followed at [facility]) with aspiration pneumonia; coughing and stress incontinence. Now weaned off supplemental oxygen; doing well on room air. Continues with surgical dressing to incision. Limited by pain and coughing.  Patient on target to meet rehab goals: yes, currently supervision overall. Able to transfer with only supervision and ambulate up to 140' using a RW and manage 4 steps with CGA.  Needs set up for upper body care , min assist for lower body care and toileting. Esophogeal dysphagia history on Dysphagia 1,  thin liquid diet. Goals for discharge set for supervision - Mod I assist.   *See Care Plan and progress notes for long and short-term goals.  Revisions to Treatment Plan:  SLP services discontinued   Teaching Needs: Safety, medications, dietary modifications, transfers, toileting, etc.   Current Barriers to Discharge: Decreased caregiver support and Home enviroment access/layout  Possible Resolutions to Barriers: Family education HH follow up services DME:3N1 commode, has a Soil scientist Summary Current Status: hip fracture, postoperative pain, chronic congestion, esophageal strictures, dysphagia  Barriers to Discharge: Medical  stability  Barriers to Discharge Comments: hip fracture, postoperative pain, chronic congestion, esophageal strictures, dysphagia Possible Resolutions to Barriers/Weekly Focus: continue to monitor incision site, encouraged follow-up with ortho on Monday, mucinex d/ced, continue robitussin prn, continue pain medication   Continued Need for Acute Rehabilitation Level of Care: The patient requires daily medical management by a physician with specialized training in physical medicine and rehabilitation for the following reasons: Direction of a multidisciplinary physical rehabilitation program to maximize functional independence : Yes Medical management of patient stability for increased activity during participation in an intensive rehabilitation regime.: Yes Analysis of laboratory values and/or radiology reports with any subsequent need for medication adjustment and/or medical intervention. : Yes   I attest that I was present, lead the team conference, and concur with the assessment and plan of the team.   Chana Bode B 04/04/2023, 2:49 PM

## 2023-04-04 NOTE — Progress Notes (Signed)
Recreational Therapy Session Note  Patient Details  Name: Michelle Vazquez MRN: [id number] Date of Birth: [birth year] Today's Date: 04/04/2023  Pain: no c/o Skilled Therapeutic Interventions/Progress Updates:   Goal:  Pt will be provided with opportunity to participate in previously enjoyed leisure task for >20 minutes. MET Pt seated in w/c upon arrival, agreeable to session.  Pt was excited to be offered the opportunity to go outside for some fresh air.  Pt has shared that she loves working in her yard, flowers.  Pt taken outside w/c level for ~30 minutes with an emphasis on leisure interests and discharge planning.  Pt enjoyed discussing life experiences, flower gardening, viewing the water gazing ball and discussing discharge plans.  Pt stating the fresh air and sunshine were therapeutic for her.  [name] 04/04/2023, 3:53 PM

## 2023-04-04 NOTE — Progress Notes (Signed)
Patient ID: Michelle Vazquez, female   DOB: [birth year], 89 y.o.   MRN: [id number]  Team Conference Report to Patient/Family  Team Conference discussion was reviewed with the patient and caregiver, including goals, any changes in plan of care and target discharge date.  Patient and caregiver express understanding and are in agreement.  The patient has a target discharge date of 04/08/23.  Sw met with patient and spoke with daughter, Michelle Vazquez ([place]) [phone].  Pt and daughter aware of discharge on Sunday. Daughter will travel to [facility] on Saturday to remain with pt for supervision at home. Pt has a follow up appointment on Monday. No additional questions or concerns.  Andria Rhein 04/04/2023, 1:56 PM

## 2023-04-04 NOTE — Progress Notes (Signed)
Patient ID: Michelle Vazquez, female   DOB: Nov 15, 1928, 89 y.o.   MRN: [id number]  Bedside Commode ordered through [employer].

## 2023-04-04 NOTE — Progress Notes (Signed)
Occupational Therapy Weekly Progress Note  Patient Details  Name: Michelle Vazquez MRN: [id number] Date of Birth: 1928-12-16  Beginning of progress report period: Mar 28, 2023 End of progress report period: Apr 04, 2023  Today's Date: 04/04/2023 OT Individual Time: 1000-1125 OT Individual Time Calculation (min): 85 min    Patient has met 3 of 3 short term goals. New goals are LTG's based on projected d/c date 04/09/23. Pt is extrmeely motivated to return home at highest level possible. Pt open to all AE and DME training to foster ADL/IADL and mobility independence. Pt now accessing bathroom with RW with close S and performing toileting and UB self care with S and set up. OT continues to train in AE use including sock aide, reacher and DME for accessing stall shower and toilet at home. Pt reports her family has ordered AE and RW bag and has brought her own RW for practice. Stall shower set up mirrored this session with CGA for accessing over threshold to 3 in 1 commode set inside. Progressing standing tolerance for functional tasks with no pain interference.   Patient continues to demonstrate the following deficits: muscle weakness and muscle joint tightness, decreased cardiorespiratoy endurance, and decreased sitting balance, decreased standing balance, decreased postural control, and decreased balance strategies and therefore will continue to benefit from skilled OT intervention to enhance overall performance with BADL, iADL, and Reduce care partner burden.  Patient progressing toward long term goals..  Continue plan of care.  OT Short Term Goals Week 1:  OT Short Term Goal 1 (Week 1): Pt will complete sit > stand with CGA using LRAD in prep for ADL task OT Short Term Goal 1 - Progress (Week 1): Met OT Short Term Goal 2 (Week 1): Pt will complete toilet transfer with CGA using LRAD OT Short Term Goal 2 - Progress (Week 1): Met OT Short Term Goal 3 (Week 1): Pt will use AE PRN to thread LB  clothing with supervision OT Short Term Goal 3 - Progress (Week 1): Met Week 2:  OT Short Term Goal 1 (Week 2): STG=LTG's d/t LOS  Skilled Therapeutic Interventions/Progress Updates:   Pt seen for weekly reassessment of progress and skilled treatment to promote functional safety and independence upon d/c home with family support. Pt rec'd up in w/c and transported for energy conservation to demo apt space. Began session with training in AE use including sock aide and reacher for L LE slipper sock donning. Pt reminded therapist of thigh high TED need and OT managed that element. Mod fading to min cues for use of AE for L LE. Issued and briefly touched on LH sponge use and will trial next shower. OT set up DME for accessing stall shower and toilet at home. Pt reports her family has ordered AE and RW bag and has brought her own RW for practice. Stall shower set up mirrored this session with CGA for accessing over threshold to 3 in 1 commode set inside with CGA and min cues for novel technique. PT reports she has a siding glass shower door and grab bars as well as a HH shower hose. Progressed amb in apt on carpet and tile as well as standing tolerance for functional tasks with no pain interference. Pt folded light laundry in standing up to 4 min x 2 trials with close S and retrieved cold beverage from fridge RW level with CGA except item transport. Pt transported back to unit hallway and amb to bathroom from and back  to w/c with close S for toileting with close S. Pt amb to sink and stood with close S for hand washing. Brief visit with TR therapy pet then OT transported pt off unit for well being as per request via w/c with OT managing w/c for time mngt. Stood at table outdoors for last interval of 4 min with close S. Pt left up in w/c with all needs, chair alarm set and call button in reach.   Pain: denied all pain in L hip with meds given earlier by nursing and adequate rest and repositioning throughout  session   Therapy Documentation Precautions:  Precautions Precautions: Fall Precaution Comments: monitor O2 Restrictions Weight Bearing Restrictions: Yes LLE Weight Bearing: Weight bearing as tolerated    Therapy/Group: Individual Therapy  Vicenta Dunning 04/04/2023, 11:35 AM

## 2023-04-04 NOTE — Progress Notes (Signed)
PROGRESS NOTE   Subjective/Complaints: No new complaints this morning Ambulating with walker from bathroom She asks about the status of her pneumonia Discussed surgical follow-up   ROS +cough- improved, +difficulty swallowing, + insomnia, denies HA, SOB,  + hip pain-controlled, worse with ambulation +chronic congestion  Objective:   No results found. Recent Labs    04/02/23 0536  WBC 8.6  HGB 9.8*  HCT 30.0*  PLT 370   Recent Labs    04/02/23 0536  NA 137  K 4.3  CL 100  CO2 30  GLUCOSE 98  BUN 23  CREATININE 0.88  CALCIUM 8.6*    Intake/Output Summary (Last 24 hours) at 04/04/2023 1315 Last data filed at 04/04/2023 1315 Gross per 24 hour  Intake 720 ml  Output --  Net 720 ml        Physical Exam: Vital Signs Blood pressure 130/63, pulse 88, temperature 98.2 F (36.8 C), temperature source Oral, resp. rate 18, height 5\' 1"  (1.549 m), weight 48.2 kg, SpO2 95 %. Constitutional:   Gen: no distress, normal appearing, BMI 20.08, extremely motivated HEENT: oral mucosa pink and moist, NCAT Cardio: Reg rate Chest: normal effort, normal rate of breathing Abd: soft, non-distended Ext: no edema Psych: pleasant, normal affect Skin: intact Musculoskeletal:     Cervical back: Neck supple. No tenderness.     Comments: Left hip incision with surgical foam dressing.   UE strength 5-/5 throughout B/L RLE 5-/5 and LLE 3-/5 in HF, otherwise 4-/5 - pain inhibits knee ext  Poor left foot clearance Skin:    General: Skin is warm and dry.  Neurological:     Mental Status: She is alert and oriented to person, place, and time.     Comments: Intact to light touch in all  4 extremities  Psychiatric:        Mood and Affect: Mood normal.        Behavior: Behavior normal.    Assessment/Plan: 1. Functional deficits which require 3+ hours per day of interdisciplinary therapy in a comprehensive inpatient rehab  setting. Physiatrist is providing close team supervision and 24 hour management of active medical problems listed below. Physiatrist and rehab team continue to assess barriers to discharge/monitor patient progress toward functional and medical goals  Care Tool:  Bathing    Body parts bathed by patient: Right arm, Left arm, Chest, Abdomen, Right upper leg, Left upper leg, Right lower leg, Face, Front perineal area, Buttocks   Body parts bathed by helper: Front perineal area, Buttocks Body parts n/a: Left lower leg   Bathing assist Assist Level: Minimal Assistance - Patient > 75%     Upper Body Dressing/Undressing Upper body dressing   What is the patient wearing?: Pull over shirt    Upper body assist Assist Level: Set up assist    Lower Body Dressing/Undressing Lower body dressing      What is the patient wearing?: Pants, Incontinence brief     Lower body assist Assist for lower body dressing: Moderate Assistance - Patient 50 - 74%     Toileting Toileting    Toileting assist Assist for toileting: Supervision/Verbal cueing Assistive Device Comment: walker   Transfers  Chair/bed transfer  Transfers assist     Chair/bed transfer assist level: Supervision/Verbal cueing     Locomotion Ambulation   Ambulation assist      Assist level: Supervision/Verbal cueing Assistive device: Walker-rolling Max distance: 168ft   Walk 10 feet activity   Assist     Assist level: Supervision/Verbal cueing Assistive device: Walker-rolling   Walk 50 feet activity   Assist Walk 50 feet with 2 turns activity did not occur: Safety/medical concerns (weakness/deconditioning, fatigue)  Assist level: Supervision/Verbal cueing Assistive device: Walker-rolling    Walk 150 feet activity   Assist Walk 150 feet activity did not occur: Safety/medical concerns (weakness/deconditioning, fatigue)         Walk 10 feet on uneven surface  activity   Assist Walk 10 feet on  uneven surfaces activity did not occur: Safety/medical concerns (weakness/deconditioning, fatigue)   Assist level: Supervision/Verbal cueing Assistive device: Walker-rolling   Wheelchair     Assist Is the patient using a wheelchair?: Yes Type of Wheelchair: Manual    Wheelchair assist level: Dependent - Patient 0% Max wheelchair distance: 63ft    Wheelchair 50 feet with 2 turns activity    Assist    Wheelchair 50 feet with 2 turns activity did not occur:  (weakness/deconditioning, fatigue)   Assist Level: Dependent - Patient 0%   Wheelchair 150 feet activity     Assist  Wheelchair 150 feet activity did not occur:  (weakness/deconditioning, fatigue)   Assist Level: Dependent - Patient 0%   Blood pressure 130/63, pulse 88, temperature 98.2 F (36.8 C), temperature source Oral, resp. rate 18, height 5\' 1"  (1.549 m), weight 48.2 kg, SpO2 95 %.    Medical Problem List and Plan: 1. Functional deficits secondary to L femoral neck fx s/p hemiarthroplasty             -patient may  shower with covering incision             -ELOS/Goals: 12 days mod I   Grounds pass ordered   -Est discharge 6/3  F/u with ortho and PM&R upon discharge. Will discuss with ortho whether they would like to remove her dressing outpatient or if we an remove her dressing here  Commended patient on her excellent progress!   Team conference 5/29  2.  Impaired mobility, therapy notes reviewed and ambulating 125 feet -DVT/anticoagulation:  SCDs/TEDs/ASA              -antiplatelet therapy: ASA 81 mg po BID  3. Pain Management: Hydrocodone prn vs tylenol prn. Recommended avoiding exercises that aggravate her pain. Provided with pain relief journal  4. Mood/Behavior/Sleep: LCSW to follow for evaluation and support.              -antipsychotic agents: N/A  5. Neuropsych/cognition: This patient is capable of making decisions on her own behalf.  6. Skin/Wound Care: Routine pressure relief  measures.   7. Fluids/Electrolytes/Nutrition:  Monitor I/O.   8. Left hip subcapital femoral neck  fx s/p anterior hemi: WBAT. Continue pot-op dressing until outpatient ortho f/u  9. Aspiration PNA: Continues to have frequent cough-->daily weights ordered to r/o fluid overload             --Continue Unasyn D# 3/5-7?             -con't 2L O2 by [place]- wean as tolerated  -discussed signs and symptoms of a pneumonia that she should be aware of  -tolerating RA this afternoon 10. Severe GERD: On Protonix IV  BID-->will change to oral route 11. Chronic dysphagia/Esophageal stricture: Is able to modify diet             --last swallow study without evidence of aspiration and no change in stricture for past 4 years             --does not like Ensure--make her own shakes at home. Will try [employer] supplement. Pt CAN use nectar thick liquids if she wants, however can stay on thin liquids.   -5/24 GI was contacted yesterday, no plans for EGD unless MBS shows something new.  She had MBS today,  SLP continuing dys 3/thin although she will need to avoid most solid foods 12. CAF: Monitor HR TID--continue Tambocor  HR controlled Vitals:   04/03/23 1936 04/04/23 0303  BP: (!) 135/58 130/63  Pulse: 93 88  Resp: 16 18  Temp: 98.9 F (37.2 C) 98.2 F (36.8 C)  SpO2: 96% 95%    13. ABLA: Recheck CBC in am. 14. Fluid overload: Monitor weight daily. Encourage low salt diet. Current weight reviewed and is 18.29  -Wt appears stable   Filed Weights   04/01/23 0842 04/02/23 0513 04/03/23 0612  Weight: 52.2 kg 51 kg 48.2 kg    15. Underweight: provided dietary education  -5/24 Nutrition adding ice cream to meals  16. Difficulty swallowing 2/2 chronic esophageal stricture: SLP ordered, GI consulted  17. Small left pleural effusion: 20mg  IV Lasix ordered. Weight reviewed 5/28 and is 48.2kg  18. Suboptimal vitamin D: liquid vitamin D ordered   19. Reduced TIBC: daily iron supplement ordered  20.  Insomnia  -5/24 will try trazodone PRN HS, DC melatonin  21. Chronic congestion: Mucinex d/ced due to patient's dysphagia and difficulty swallowing her pills, continue robitussin prn  >50 minutes spent in discussing patient's progress in team conference, moving up her d/c date, d/ced Mucinex due to patient's dysphagia and difficulty swallowing her pills, robitussin continued   LOS: 8 days A FACE TO FACE EVALUATION WAS PERFORMED  Tlaloc Taddei P Latonya Knight 04/04/2023, 1:15 PM

## 2023-04-04 NOTE — Progress Notes (Signed)
Recreational Therapy Session Note  Patient Details  Name: Michelle Vazquez MRN: [id number] Date of Birth: [birth year] Today's Date: 04/04/2023  Pain:no c/o  Pt will participate in Animal assisted activity (AAA) with supervision.  MET  Pt was thrilled to participate in AAA today.  Pt was seated w/c level engaging in conversation with CTRS and pet partner team while also petting the therapy dog.  Pt discussed her personal dog [name] and how much loved and missed her.  Pt appreciative of this visit. Elen Acero 04/04/2023, 4:01 PM

## 2023-04-05 DIAGNOSIS — S72002S Fracture of unspecified part of neck of left femur, sequela: Secondary | ICD-10-CM | POA: Diagnosis not present

## 2023-04-05 MED ORDER — ASPIRIN 81 MG PO CHEW
81.0000 mg | CHEWABLE_TABLET | Freq: Two times a day (BID) | ORAL | 0 refills | Status: DC
  Filled 2023-04-05: qty 90, 45d supply, fill #0

## 2023-04-05 MED ORDER — FAMOTIDINE 20 MG PO TABS
20.0000 mg | ORAL_TABLET | Freq: Two times a day (BID) | ORAL | 0 refills | Status: DC
  Filled 2023-04-05: qty 60, 30d supply, fill #0

## 2023-04-05 MED ORDER — FUROSEMIDE 20 MG PO TABS
20.0000 mg | ORAL_TABLET | Freq: Once | ORAL | Status: AC
  Administered 2023-04-05: 20 mg via ORAL
  Filled 2023-04-05: qty 1

## 2023-04-05 MED ORDER — NYSTATIN 100000 UNIT/ML MT SUSP
5.0000 mL | Freq: Four times a day (QID) | OROMUCOSAL | 0 refills | Status: DC
  Filled 2023-04-05: qty 473, 24d supply, fill #0

## 2023-04-05 MED ORDER — DOCUSATE SODIUM 50 MG/5ML PO LIQD
100.0000 mg | Freq: Two times a day (BID) | ORAL | 0 refills | Status: DC
  Filled 2023-04-05: qty 280, 14d supply, fill #0

## 2023-04-05 MED ORDER — ACETAMINOPHEN 160 MG/5ML PO SOLN
650.0000 mg | Freq: Four times a day (QID) | ORAL | 0 refills | Status: DC | PRN
  Filled 2023-04-05: qty 120, 2d supply, fill #0

## 2023-04-05 MED ORDER — HYDROCODONE-ACETAMINOPHEN 5-325 MG PO TABS
1.0000 | ORAL_TABLET | Freq: Four times a day (QID) | ORAL | 0 refills | Status: DC | PRN
  Filled 2023-04-05: qty 28, 4d supply, fill #0

## 2023-04-05 MED ORDER — FERROUS SULFATE 220 (44 FE) MG/5ML PO SOLN
300.0000 mg | Freq: Every day | ORAL | 3 refills | Status: DC
  Filled 2023-04-05: qty 204, 30d supply, fill #0

## 2023-04-05 MED ORDER — CHOLECALCIFEROL 10 MCG/ML (400 UNIT/ML) PO LIQD
400.0000 [IU] | Freq: Every day | ORAL | 0 refills | Status: DC
  Filled 2023-04-05: qty 50, 50d supply, fill #0

## 2023-04-05 MED ORDER — TRAZODONE HCL 50 MG PO TABS
25.0000 mg | ORAL_TABLET | Freq: Every evening | ORAL | 0 refills | Status: DC | PRN
  Filled 2023-04-05: qty 10, 20d supply, fill #0

## 2023-04-05 NOTE — Progress Notes (Signed)
Occupational Therapy Session Note  Patient Details  Name: Michelle Vazquez MRN: [id number] Date of Birth: 02-08-1929  Today's Date: 04/05/2023 OT Individual Time: 0950-1100 OT Individual Time Calculation (min): 70 min    Short Term Goals: Week 2:  OT Short Term Goal 1 (Week 2): STG=LTG's d/t LOS  Skilled Therapeutic Interventions/Progress Updates:      Therapy Documentation Precautions:  Precautions Precautions: Fall Precaution Comments: monitor O2 Restrictions Weight Bearing Restrictions: Yes LLE Weight Bearing: Weight bearing as tolerated General: "I grew my own orange flowers" Pt seated in recliner chair upon OT arrival, agreeable to OT.  Pain:   0/10 pain upon arrival. No intervention needed.  Transfers: -sit to stand with/without RW, SBA for multiple trials -stand pivot transfers SBA from RW>< nu step   Functional mobility: Pt ambulated from room>< therapy gym SBA using RW with no LOB/SOB reported/noted. Initially noting small steps with shuffle, however following use of nu step and stretching, pt demonstrated reciprical stepping pattern with ambulation.    Exercises: -Pt completed 12 minutes on nustep with UE and LE, completed at slow, steady pace in order to stretch and strengthen LLE and L hip for increased independence with ADLs such as bathing and functional transfers. Completed exercise with no breaks necessary.  -Pt completed 12 leg lifts with no weight in order to strengthen LLE for ADLs such as bed mobility -Pt completed 10 seated marches with no weight in order to strengthen LLE for ADLs such as bed mobility  Bed mobility (simulated): Pt completed multiple trials of EOM>< long sit on mat table in order to complete repetitive task practice of managing legs into bed. Pt completed task SBA with VC for DME management and direction.   Education: OT confirmed DME order and delivery at home to maintain pt safety when completing ADLs and functional mobility in the  home once DC. Pt confirmed owning RW, and plans to use new BSC for toileting and [name] (granddaughter) purchased shower chair with back and arm rests solely for showers. OT educated/issued pt leg lifter to manage LLE into bed.   Pt seated in recliner at end of session with alarm donned, call light within reach and 4Ps assessed.    Therapy/Group: Individual Therapy  Melvyn Novas, MS, OTR/L  04/05/2023, 12:25 PM

## 2023-04-05 NOTE — Progress Notes (Signed)
Patient ID: Michelle Vazquez, female   DOB: 10/01/1929, 89 y.o.   MRN: [id number]  Patient [facility] referral sent to [facility] approved for Pt/OT

## 2023-04-05 NOTE — Progress Notes (Signed)
Recreational Therapy Session Note  Patient Details  Name: Michelle Vazquez MRN: [id number] Date of Birth: 11-30-1928 Today's Date: 04/05/2023  Pain: no c/o Skilled Therapeutic Interventions/Progress Updates:  Goals:  Pt will ambulate on uneven outdoor surfaces with RW with supervison.  MET Pt will maintain dynamic standing balance reaching outside BOS with contact guard assist.  MET  Pt taken outside w/c leve for time and energy management.  Once outside pt ambulated ~125' and 100" with RW with supervision on slightly uneven community surfaces.  Pt demonstrated good safety awareness and modified stance/posture throughout ambulation with independence.  Pt also stood with CGA to play horseshoes reaching outside BOS in all directions.  Pt stated again today how much she enjoyed being outside.   Therapy/Group: Co-Treatment  Jorian Willhoite 04/05/2023, 3:56 PM

## 2023-04-05 NOTE — Progress Notes (Signed)
Physical Therapy Session Note  Patient Details  Name: Michelle Vazquez MRN: [id number] Date of Birth: 1928-12-26  Today's Date: 04/05/2023 PT Individual Time: 0802-0900 and 1346-1500 PT Individual Time Calculation (min): 58 min and 74 min  Short Term Goals: Week 1:  PT Short Term Goal 1 (Week 1): pt will transfer bed<>chair with LRAD and CGA PT Short Term Goal 2 (Week 1): pt will ambulate 13ft with LRAD and CGA PT Short Term Goal 3 (Week 1): pt will navigate 4 steps with 1 handrail and min A  Skilled Therapeutic Interventions/Progress Updates:   Treatment Session 1 Received pt sitting in recliner taking medications - required significantly increased time due continued difficulty swallowing pills and coughing frequently. Pt agreeable to PT treatment and reported pain in L hip was "tolerable" Session with emphasis on functional mobility/transfers, generalized strengthening and endurance, and dynamic standing balance/coordination, stair navigation, toileting, and gait training. Donned bilateral ted hose with total A for edema management and pt performed all transfers with RW and supervision throughout session. Pt ambulated ~150ft x 2 trials with RW and supervision to/from main therapy gym - cues for upright gaze and to relax shoulders. Pt then navigated 4 6in steps with L handrail and close supervision using a lateral stepping technique to simulate pt's home entry - cues to exhale with exertion. Pt then navigated obstacle course stepping over hurdles x54ft lifting RW over 6in hurdles, then stepping, with CGA overall - emphasis on L hip flexor strengthening. Returned to room and ambulated in/out of bathroom with RW and supervision. Pt able to mange clothing with close supervision, void, and perform hygiene management without assist. Pt incontinent of urine - therefore removed soiled brief and donned clean one in standing. Concluded session with pt sitting in recliner on Roho cushion with all needs within  reach.   Treatment Session 2 Received pt sitting in recliner, pt agreeable to PT treatment, and denied any pain at rest but requesting pain medicine prior to therapy - per RN pt's BP low earlier today. Session with emphasis on functional mobility/transfers, community navigation, generalized strengthening and endurance, dynamic standing balance/coordination, and gait training. Pt performed all transfers with RW and supervision throughout session. Pt transported outside to entrance of WCC in WC dependently. Pt ambulated ~140ft x 1 and 169ft x 1 with RW and supervision over uneven surfaces (concrete and up/down mild slopes). Worked on dynamic standing balance tossing horseshoes x 3 trials without UE support and CGA for balance - increasing challenge from wide BOS to narrow BOS. Pt required multiple rest breaks throughout session due to fatigue. Returned to room and pt ambulated in/out of bathroom with RW and supervision. Pt able to manage clothing, void, and perform pericare without assist. Concluded session with pt sitting in recliner, needs within reach, and chair pad alarm on.   Vitals: Sitting: 108/47 Standing: 107/52 Sitting at end of session: 121/50  Therapy Documentation Precautions:  Precautions Precautions: Fall Precaution Comments: monitor O2 Restrictions Weight Bearing Restrictions: Yes LLE Weight Bearing: Weight bearing as tolerated  Therapy/Group: Individual Therapy [name] PT, DPT 04/05/2023, 7:32 AM

## 2023-04-05 NOTE — Progress Notes (Signed)
PROGRESS NOTE   Subjective/Complaints: No new complaints this morning Asks how her pneumonia is Feels her coughing has improved Has been using the flutter valve   ROS +cough- improved, +difficulty swallowing- stable, + insomnia, denies HA, SOB,  + hip pain-controlled, worse with ambulation +chronic congestion  Objective:   No results found. No results for input(s): "WBC", "HGB", "HCT", "PLT" in the last 72 hours.  No results for input(s): "NA", "K", "CL", "CO2", "GLUCOSE", "BUN", "CREATININE", "CALCIUM" in the last 72 hours.   Intake/Output Summary (Last 24 hours) at 04/05/2023 1047 Last data filed at 04/05/2023 1610 Gross per 24 hour  Intake 600 ml  Output --  Net 600 ml        Physical Exam: Vital Signs Blood pressure 131/65, pulse 79, temperature 98.1 F (36.7 C), resp. rate 15, height 5\' 1"  (1.549 m), weight 51.2 kg, SpO2 94 %. Constitutional:   Gen: no distress, normal appearing, BMI 20.08, extremely motivated HEENT: oral mucosa pink and moist, NCAT Cardio: Reg rate Chest: normal effort, normal rate of breathing Abd: soft, non-distended Ext: no edema Psych: pleasant, normal affect Skin: intact Musculoskeletal:     Cervical back: Neck supple. No tenderness.     Comments: Left hip incision with surgical foam dressing.   UE strength 5-/5 throughout B/L RLE 5-/5 and LLE 3-/5 in HF, otherwise 4-/5 - pain inhibits knee ext  Poor left foot clearance Skin:    General: Skin is warm and dry.  Neurological:     Mental Status: She is alert and oriented to person, place, and time.     Comments: Intact to light touch in all  4 extremities  Psychiatric:    Bright and positive affect  Assessment/Plan: 1. Functional deficits which require 3+ hours per day of interdisciplinary therapy in a comprehensive inpatient rehab setting. Physiatrist is providing close team supervision and 24 hour management of active medical  problems listed below. Physiatrist and rehab team continue to assess barriers to discharge/monitor patient progress toward functional and medical goals  Care Tool:  Bathing    Body parts bathed by patient: Right arm, Left arm, Chest, Abdomen, Right upper leg, Left upper leg, Right lower leg, Face, Front perineal area, Buttocks   Body parts bathed by helper: Front perineal area, Buttocks Body parts n/a: Left lower leg   Bathing assist Assist Level: Minimal Assistance - Patient > 75%     Upper Body Dressing/Undressing Upper body dressing   What is the patient wearing?: Pull over shirt    Upper body assist Assist Level: Set up assist    Lower Body Dressing/Undressing Lower body dressing      What is the patient wearing?: Pants, Incontinence brief     Lower body assist Assist for lower body dressing: Moderate Assistance - Patient 50 - 74%     Toileting Toileting    Toileting assist Assist for toileting: Supervision/Verbal cueing Assistive Device Comment: walker   Transfers Chair/bed transfer  Transfers assist     Chair/bed transfer assist level: Supervision/Verbal cueing     Locomotion Ambulation   Ambulation assist      Assist level: Supervision/Verbal cueing Assistive device: Walker-rolling Max distance: 166ft  Walk 10 feet activity   Assist     Assist level: Supervision/Verbal cueing Assistive device: Walker-rolling   Walk 50 feet activity   Assist Walk 50 feet with 2 turns activity did not occur: Safety/medical concerns (weakness/deconditioning, fatigue)  Assist level: Supervision/Verbal cueing Assistive device: Walker-rolling    Walk 150 feet activity   Assist Walk 150 feet activity did not occur: Safety/medical concerns (weakness/deconditioning, fatigue)  Assist level: Supervision/Verbal cueing Assistive device: Walker-rolling    Walk 10 feet on uneven surface  activity   Assist Walk 10 feet on uneven surfaces activity did  not occur: Safety/medical concerns (weakness/deconditioning, fatigue)   Assist level: Supervision/Verbal cueing Assistive device: Walker-rolling   Wheelchair     Assist Is the patient using a wheelchair?: Yes Type of Wheelchair: Manual    Wheelchair assist level: Dependent - Patient 0% Max wheelchair distance: 15ft    Wheelchair 50 feet with 2 turns activity    Assist    Wheelchair 50 feet with 2 turns activity did not occur:  (weakness/deconditioning, fatigue)   Assist Level: Dependent - Patient 0%   Wheelchair 150 feet activity     Assist  Wheelchair 150 feet activity did not occur:  (weakness/deconditioning, fatigue)   Assist Level: Dependent - Patient 0%   Blood pressure 131/65, pulse 79, temperature 98.1 F (36.7 C), resp. rate 15, height 5\' 1"  (1.549 m), weight 51.2 kg, SpO2 94 %.    Medical Problem List and Plan: 1. Functional deficits secondary to L femoral neck fx s/p hemiarthroplasty             -patient may  shower with covering incision             -ELOS/Goals: 12 days mod I   Grounds pass ordered   -Est discharge 6/3  F/u with ortho and PM&R upon discharge. Will discuss with ortho whether they would like to remove her dressing outpatient or if we an remove her dressing here  Commended patient on her excellent progress!   Team conference 5/29  2.  Impaired mobility, therapy notes reviewed and ambulating 125 feet -DVT/anticoagulation:  SCDs/TEDs/ASA              -antiplatelet therapy: ASA 81 mg po BID  3. Pain Management: Hydrocodone prn vs tylenol prn. Recommended avoiding exercises that aggravate her pain. Provided with pain relief journal  4. Mood/Behavior/Sleep: LCSW to follow for evaluation and support.              -antipsychotic agents: N/A  5. Neuropsych/cognition: This patient is capable of making decisions on her own behalf.  6. Skin/Wound Care: Routine pressure relief measures.   7. Fluids/Electrolytes/Nutrition:  Monitor I/O.    8. Left hip subcapital femoral neck  fx s/p anterior hemi: WBAT. Continue pot-op dressing until outpatient ortho f/u  9. Aspiration PNA: Continues to have frequent cough-->daily weights ordered to r/o fluid overload             --Continue Unasyn D# 3/5-7?             -con't 2L O2 by [place]- wean as tolerated  -discussed signs and symptoms of a pneumonia that she should be aware of  -tolerating RA this afternoon 10. Severe GERD: On Protonix IV BID-->will change to oral route 11. Chronic dysphagia/Esophageal stricture: Is able to modify diet             --last swallow study without evidence of aspiration and no change in stricture for past 4 years             --  does not like Ensure--make her own shakes at home. Will try [employer] supplement. Pt CAN use nectar thick liquids if she wants, however can stay on thin liquids.   -5/24 GI was contacted yesterday, no plans for EGD unless MBS shows something new.  She had MBS today,  SLP continuing dys 3/thin although she will need to avoid most solid foods 12. CAF: Monitor HR TID--continue Tambocor  HR controlled Vitals:   04/04/23 1948 04/05/23 0416  BP: (!) 125/52 131/65  Pulse: 91 79  Resp: 18 15  Temp: 98.4 F (36.9 C) 98.1 F (36.7 C)  SpO2: 95% 94%    13. ABLA: Recheck CBC in am. 14. Fluid overload: Monitor weight daily. Encourage low salt diet. Current weight reviewed and is 18.29  -Wt appears stable   Filed Weights   04/02/23 0513 04/03/23 0612 04/05/23 0500  Weight: 51 kg 48.2 kg 51.2 kg    15. Underweight: provided dietary education  -5/24 Nutrition adding ice cream to meals  16. Difficulty swallowing 2/2 chronic esophageal stricture: SLP ordered, GI consulted  17. Small left pleural effusion: 20mg  IV Lasix ordered. Weight reviewed 5/28 and is 48.2kg, is 51.2 kg on 5/30, will give 20 oral Lasix on 5/30, please send home with prn lasix for shortness of breath  18. Suboptimal vitamin D: continue liquid vitamin D   19. Reduced  TIBC: continue iron supplement ordered  20. Insomnia  -5/24 will try trazodone PRN HS, DC melatonin  21. Chronic congestion: Mucinex d/ced due to patient's dysphagia and difficulty swallowing her pills, continue robitussin prn, discussed that she prefers to use less medication.      LOS: 9 days A FACE TO FACE EVALUATION WAS PERFORMED  Drema Pry Marsha Hillman 04/05/2023, 10:47 AM

## 2023-04-06 ENCOUNTER — Other Ambulatory Visit (HOSPITAL_COMMUNITY): Payer: Self-pay

## 2023-04-06 MED ORDER — POLYETHYLENE GLYCOL 3350 17 GM/SCOOP PO POWD
17.0000 g | Freq: Every day | ORAL | 0 refills | Status: DC | PRN
  Filled 2023-04-06: qty 238, 14d supply, fill #0

## 2023-04-06 MED ORDER — FUROSEMIDE 20 MG PO TABS
20.0000 mg | ORAL_TABLET | Freq: Once | ORAL | Status: AC
  Administered 2023-04-06: 20 mg via ORAL
  Filled 2023-04-06: qty 1

## 2023-04-06 MED ORDER — POLYETHYLENE GLYCOL 3350 17 G PO PACK
17.0000 g | PACK | Freq: Every day | ORAL | Status: DC
  Administered 2023-04-06 – 2023-04-07 (×2): 17 g via ORAL
  Filled 2023-04-06 (×3): qty 1

## 2023-04-06 MED ORDER — FUROSEMIDE 20 MG PO TABS
20.0000 mg | ORAL_TABLET | Freq: Every day | ORAL | 0 refills | Status: DC | PRN
  Filled 2023-04-06: qty 10, 10d supply, fill #0

## 2023-04-06 NOTE — Progress Notes (Signed)
Patient ID: Michelle Vazquez, female   DOB: Apr 05, 1929, 89 y.o.   MRN: [id number]  SW left handicap placard completed form in room.   Cecile Sheerer, MSW, LCSWA Office: [phone] Cell: [phone] Fax: [phone]

## 2023-04-06 NOTE — Progress Notes (Signed)
PROGRESS NOTE   Subjective/Complaints: C/o mild ankle swelling this morning- Michelle Vazquez is donning thigh high ted compression garments, Michelle Vazquez was not on lasix at home, will order 20 today   ROS +cough- improved, +difficulty swallowing- stable, + insomnia, denies HA, SOB,  + hip pain-controlled, worse with ambulation, +mild ankle swelling +chronic congestion  Objective:   No results found. No results for input(s): "WBC", "HGB", "HCT", "PLT" in the last 72 hours.  No results for input(s): "NA", "K", "CL", "CO2", "GLUCOSE", "BUN", "CREATININE", "CALCIUM" in the last 72 hours.   Intake/Output Summary (Last 24 hours) at 04/06/2023 1425 Last data filed at 04/06/2023 0728 Gross per 24 hour  Intake 240 ml  Output --  Net 240 ml        Physical Exam: Vital Signs Blood pressure (!) 126/59, pulse 94, temperature 98 F (36.7 C), temperature source Oral, resp. rate 18, height 5\' 1"  (1.549 m), weight 52.1 kg, SpO2 97 %. Constitutional:   Gen: no distress, normal appearing, BMI 20.08, extremely motivated HEENT: oral mucosa pink and moist, NCAT Cardio: Reg rate Chest: normal effort, normal rate of breathing Abd: soft, non-distended Ext: mild bilateral ankle swelling Psych: pleasant, normal affect Skin: intact Musculoskeletal:     Cervical back: Neck supple. No tenderness.     Comments: Left hip incision with surgical foam dressing.   UE strength 5-/5 throughout B/L RLE 5-/5 and LLE 3-/5 in HF, otherwise 4-/5 - pain inhibits knee ext  Poor left foot clearance Skin:    General: Skin is warm and dry.  Neurological:     Mental Status: Michelle Vazquez is alert and oriented to person, place, and time.     Comments: Intact to light touch in all  4 extremities  Psychiatric:    Bright and positive affect  Assessment/Plan: 1. Functional deficits which require 3+ hours per day of interdisciplinary therapy in a comprehensive inpatient rehab  setting. Physiatrist is providing close team supervision and 24 hour management of active medical problems listed below. Physiatrist and rehab team continue to assess barriers to discharge/monitor patient progress toward functional and medical goals  Care Tool:  Bathing    Body parts bathed by patient: Right arm, Left arm, Chest, Abdomen, Right upper leg, Left upper leg, Right lower leg, Face, Front perineal area, Buttocks   Body parts bathed by helper: Front perineal area, Buttocks Body parts n/a: Left lower leg   Bathing assist Assist Level: Minimal Assistance - Patient > 75%     Upper Body Dressing/Undressing Upper body dressing   What is the patient wearing?: Pull over shirt    Upper body assist Assist Level: Set up assist    Lower Body Dressing/Undressing Lower body dressing      What is the patient wearing?: Pants, Incontinence brief     Lower body assist Assist for lower body dressing: Moderate Assistance - Patient 50 - 74%     Toileting Toileting    Toileting assist Assist for toileting: Supervision/Verbal cueing Assistive Device Comment: walker   Transfers Chair/bed transfer  Transfers assist     Chair/bed transfer assist level: Independent with assistive device Chair/bed transfer assistive device: Geologist, engineering  Ambulation assist      Assist level: Independent with assistive device Assistive device: Walker-rolling Max distance: 154ft   Walk 10 feet activity   Assist     Assist level: Independent with assistive device Assistive device: Walker-rolling   Walk 50 feet activity   Assist Walk 50 feet with 2 turns activity did not occur: Safety/medical concerns (weakness/deconditioning, fatigue)  Assist level: Independent with assistive device Assistive device: Walker-rolling    Walk 150 feet activity   Assist Walk 150 feet activity did not occur: Safety/medical concerns (weakness/deconditioning, fatigue)  Assist  level: Independent with assistive device Assistive device: Walker-rolling    Walk 10 feet on uneven surface  activity   Assist Walk 10 feet on uneven surfaces activity did not occur: Safety/medical concerns (weakness/deconditioning, fatigue)   Assist level: Independent with assistive device Assistive device: Walker-rolling   Wheelchair     Assist Is the patient using a wheelchair?: No Type of Wheelchair: Manual Wheelchair activity did not occur: N/A  Wheelchair assist level: Dependent - Patient 0% Max wheelchair distance: 15ft    Wheelchair 50 feet with 2 turns activity    Assist    Wheelchair 50 feet with 2 turns activity did not occur: N/A   Assist Level: Dependent - Patient 0%   Wheelchair 150 feet activity     Assist  Wheelchair 150 feet activity did not occur: N/A   Assist Level: Dependent - Patient 0%   Blood pressure (!) 126/59, pulse 94, temperature 98 F (36.7 C), temperature source Oral, resp. rate 18, height 5\' 1"  (1.549 m), weight 52.1 kg, SpO2 97 %.    Medical Problem List and Plan: 1. Functional deficits secondary to L femoral neck fx s/p hemiarthroplasty             -patient may  shower with covering incision             -ELOS/Goals: 12 days mod I   Grounds pass ordered   -Est discharge 6/3  F/u with ortho and PM&R upon discharge. Will discuss with ortho whether they would like to remove her dressing outpatient or if we an remove her dressing here  Commended patient on her excellent progress!   Team conference 5/29  Handicap placard provided  2.  Impaired mobility, therapy notes reviewed and ambulating 125 feet -DVT/anticoagulation:  SCDs/TEDs/ASA              -antiplatelet therapy: ASA 81 mg po BID  3. Pain Management: Hydrocodone prn vs tylenol prn. Recommended avoiding exercises that aggravate her pain. Provided with pain relief journal  4. Mood/Behavior/Sleep: LCSW to follow for evaluation and support.               -antipsychotic agents: N/A  5. Neuropsych/cognition: This patient is capable of making decisions on her own behalf.  6. Skin/Wound Care: Routine pressure relief measures.   7. Fluids/Electrolytes/Nutrition:  Monitor I/O.   8. Left hip subcapital femoral neck  fx s/p anterior hemi: WBAT. Continue post-op dressing until outpatient ortho f/u, discussed with patient  9. Aspiration PNA: Continues to have frequent cough but it has much improved-->daily weights ordered to r/o fluid overload             --Completed Unasyn D# 3/5-7?             -weaned off O2  -discussed signs and symptoms of a pneumonia that Michelle Vazquez should be aware of  -tolerating RA this afternoon 10. Severe GERD: On Protonix IV BID-->will change  to oral route 11. Chronic dysphagia/Esophageal stricture: Is able to modify diet             --last swallow study without evidence of aspiration and no change in stricture for past 4 years             --does not like Ensure--make her own shakes at home. Will try [employer] supplement. Pt CAN use nectar thick liquids if Michelle Vazquez wants, however can stay on thin liquids.   -5/24 GI was contacted yesterday, no plans for EGD unless MBS shows something new.  Michelle Vazquez had MBS today,  SLP continuing dys 3/thin although Michelle Vazquez will need to avoid most solid foods 12. CAF: Monitor HR TID--continue Tambocor  HR controlled Vitals:   04/06/23 0540 04/06/23 1345  BP: 128/70 (!) 126/59  Pulse: 96 94  Resp: 18 18  Temp: 98.3 F (36.8 C) 98 F (36.7 C)  SpO2: 97% 97%    13. ABLA: Recheck CBC in am. 14. Fluid overload: Monitor weight daily. Encourage low salt diet. Current weight reviewed and is 18.29  -Wt appears stable   Filed Weights   04/03/23 0612 04/05/23 0500 04/06/23 0617  Weight: 48.2 kg 51.2 kg 52.1 kg    15. Underweight: provided dietary education  -5/24 Nutrition adding ice cream to meals  16. Difficulty swallowing 2/2 chronic esophageal stricture: SLP ordered, GI consulted  17. Small left  pleural effusion: 20mg  IV Lasix ordered. Weight reviewed 5/28 and is 48.2kg, is 51.2 kg on 5/30, will give 20 oral Lasix on 5/30, please send home with prn lasix for shortness of breath  18. Suboptimal vitamin D: continue liquid vitamin D   19. Reduced TIBC: continue iron supplement ordered  20. Insomnia  -5/24 will try trazodone PRN HS, DC melatonin  21. Chronic congestion: Mucinex d/ced due to patient's dysphagia and difficulty swallowing her pills, continue robitussin prn, discussed that Michelle Vazquez prefers to use less medication.   22. Bilateral mild swelling in ankles: lasix 20mg  one time ordered 5/31     LOS: 10 days A FACE TO FACE EVALUATION WAS PERFORMED  Rad Gramling P Shamecka Hocutt 04/06/2023, 2:25 PM

## 2023-04-06 NOTE — Progress Notes (Signed)
Inpatient Rehabilitation Care Coordinator Discharge Note   Patient Details  Name: Michelle Vazquez MRN: [id number] Date of Birth: 06/01/1929   Discharge location: Home  Length of Stay: 12 Days  Discharge activity level: Sup  Home/community participation: Daughter  Patient response YN:WGNFAO Literacy - How often do you need to have someone help you when you read instructions, pamphlets, or other written material from your doctor or pharmacy?: Never  Patient response ZH:YQMVHQ Isolation - How often do you feel lonely or isolated from those around you?: Never  Services provided included: MD, RD, PT, OT, SLP, RN, CM, TR, Pharmacy, SW  Financial Services:  Financial Services Utilized: Medicare    Choices offered to/list presented to: pt and daughter  Follow-up services arranged:  Home Health, DME Home Health Agency: [facility]    DME : Bedside Commode    Patient response to transportation need: Is the patient able to respond to transportation needs?: Yes In the past 12 months, has lack of transportation kept you from medical appointments or from getting medications?: No In the past 12 months, has lack of transportation kept you from meetings, work, or from getting things needed for daily living?: No   Patient/Family verbalized understanding of follow-up arrangements:  Yes  Individual responsible for coordination of the follow-up plan: self or Corrie Dandy, [phone]  Confirmed correct DME delivered: Andria Rhein 04/06/2023    Comments (or additional information):  Summary of Stay    Date/Time Discharge Planning CSW  04/03/23 0957 Discharging home with  assistance from son and daughter. 4 steps to enter. CJB       Andria Rhein

## 2023-04-06 NOTE — Progress Notes (Signed)
Physical Therapy Discharge Summary  Patient Details  Name: Michelle Vazquez MRN: [id number] Date of Birth: [birth year]  Date of Discharge from PT service:April 07, 2023  Patient has met {NUMBERS 0-12:[id number]} of 8 long term goals due to improved activity tolerance, improved balance, improved postural control, increased strength, increased range of motion, decreased pain, ability to compensate for deficits, improved awareness, and improved coordination. Patient to discharge at an ambulatory level Modified Independent using RW. Patient's care partner is independent to provide the necessary physical assistance at discharge. Pt's granddaughter, Michelle Vazquez, attended family education training on 5/28 and verbalized and demonstrated confidence with tasks to ensure safe discharge home.   Reasons goals not met: ***  Recommendation:  Patient will benefit from ongoing skilled PT services in home health setting to continue to advance safe functional mobility, address ongoing impairments in transfers, generalized strengthening and endurance, dynamic standing balance/coordination, gait training, stair navigation, and to minimize fall risk.  Equipment: No equipment provided - already has RW  Reasons for discharge: treatment goals met  Patient/family agrees with progress made and goals achieved: Yes  PT Discharge Precautions/Restrictions Precautions Precautions: Fall Restrictions Weight Bearing Restrictions: No LLE Weight Bearing: Weight bearing as tolerated Pain Interference Pain Interference Pain Effect on Sleep: 1. Rarely or not at all Pain Interference with Therapy Activities: 1. Rarely or not at all Pain Interference with Day-to-Day Activities: 1. Rarely or not at all Cognition Overall Cognitive Status: Within Functional Limits for tasks assessed Arousal/Alertness: Awake/alert Orientation Level: Oriented X4 Memory: Appears intact Awareness: Appears intact Problem Solving: Appears  intact Safety/Judgment: Appears intact Sensation Sensation Light Touch: Appears Intact Hot/Cold: Appears Intact Proprioception: Appears Intact Stereognosis: Not tested Coordination Gross Motor Movements are Fluid and Coordinated: Yes Fine Motor Movements are Fluid and Coordinated: Yes Finger Nose Finger Test: slower LUE>RUE Heel Shin Test: WFL on RLE, slower and decreased ROM on LLE but improved since eval Motor  Motor Motor: Within Functional Limits  Mobility Bed Mobility Bed Mobility: Rolling Right;Rolling Left;Sit to Supine;Supine to Sit Rolling Right: Independent with assistive device Rolling Left: Independent with assistive device Supine to Sit: Independent with assistive device Sit to Supine: Independent with assistive device Transfers Transfers: Sit to Stand;Stand to Sit;Stand Pivot Transfers Sit to Stand: Independent with assistive device Stand to Sit: Independent with assistive device Stand Pivot Transfers: Independent with assistive device Transfer (Assistive device): Rolling walker Locomotion  Gait Ambulation: Yes Gait Assistance: Independent with assistive device Gait Distance (Feet): 150 Feet Assistive device: Rolling walker Gait Gait: Yes Gait Pattern: Impaired Gait Pattern: Step-to pattern;Decreased step length - right;Decreased step length - left;Decreased stride length;Antalgic;Poor foot clearance - left;Poor foot clearance - right;Narrow base of support;Step-through pattern Gait velocity: decreased Stairs / Additional Locomotion Stairs: Yes Stairs Assistance: Supervision/Verbal cueing Stair Management Technique: One rail Left Number of Stairs: 8 Height of Stairs: 6 Ramp: Independent with assistive device (RW) Curb: Supervision/Verbal cueing (RW) Wheelchair Mobility Wheelchair Mobility: No  Trunk/Postural Assessment  Cervical Assessment Cervical Assessment: Exceptions to [facility] (forward head) Thoracic Assessment Thoracic Assessment: Exceptions to  [facility] (flexible kyphosis) Lumbar Assessment Lumbar Assessment: Exceptions to [facility] (posterior pelvic tilt) Postural Control Postural Control: Within Functional Limits  Balance Balance Balance Assessed: Yes Static Sitting Balance Static Sitting - Balance Support: Feet supported;Bilateral upper extremity supported Static Sitting - Level of Assistance: 7: Independent Dynamic Sitting Balance Dynamic Sitting - Balance Support: Feet supported;No upper extremity supported Dynamic Sitting - Level of Assistance: 6: Modified independent (Device/Increase time) Static Standing Balance Static Standing - Balance Support:  Bilateral upper extremity supported;During functional activity (RW) Static Standing - Level of Assistance: 6: Modified independent (Device/Increase time) Dynamic Standing Balance Dynamic Standing - Balance Support: Bilateral upper extremity supported;During functional activity (RW) Dynamic Standing - Level of Assistance: 6: Modified independent (Device/Increase time) Dynamic Standing - Comments: with transfers and gait Extremity Assessment  RLE Assessment RLE Assessment: Exceptions to [facility] General Strength Comments: tested sitting in [facility] RLE Strength Right Hip Flexion: 4-/5 Right Hip ABduction: 4/5 Right Hip ADduction: 4/5 Right Knee Flexion: 4/5 Right Knee Extension: 4/5 Right Ankle Dorsiflexion: 4/5 Right Ankle Plantar Flexion: 4/5 LLE Assessment LLE Assessment: Exceptions to [facility] General Strength Comments: tested sitting in WC LLE Strength Left Hip Flexion: 3+/5 Left Hip ABduction: 4-/5 Left Hip ADduction: 4-/5 Left Knee Flexion: 4-/5 Left Knee Extension: 3+/5 Left Ankle Dorsiflexion: 4-/5 Left Ankle Plantar Flexion: 4-/5   Michelle Vazquez PT, DPT 04/06/2023, 7:15 AM

## 2023-04-06 NOTE — Progress Notes (Addendum)
Patient ID: Michelle Vazquez, female   DOB: [birth year], 89 y.o.   MRN: [id number]  Sw spoke with daughter, Fleet Contras to address d/c questions or concerns. SW contact information provided. Pt waiting on [facility] delivery today. No additional questions or concerns.  11:56 AM: Patient BSC in the room. Pt currently with no questions or concerns. Pt expressed she is tired but prefers to remain up.

## 2023-04-06 NOTE — Progress Notes (Signed)
Occupational Therapy Discharge Summary  Patient Details  Name: Michelle Vazquez MRN: [id number] Date of Birth: 04-27-1929  Today's Date: 04/07/2023 OT Individual Time: 4098-1191 OT Individual Time Calculation (min): 55 min   Date of Discharge from OT service:April 07, 2023   Patient has met 8 of 8 long term goals due to improved activity tolerance, improved balance, functional use of  LEFT lower extremity, and improved coordination.  Patient to discharge at overall Modified Independent level.  Patient's care partner is independent to provide the necessary physical assistance at discharge. Pt's granddaughter, [name], has been present for numerous sessions to participate in education regarding ADL and functional transfer recommendations for safe DC home.  All goals met  Recommendation:  Patient will benefit from ongoing skilled OT services in home health setting to continue to advance functional skills in the area of BADL and iADL.  Equipment: 3 in 1 BSC  Reasons for discharge: treatment goals met  Patient/family agrees with progress made and goals achieved: Yes  OT Discharge Precautions/Restrictions  Precautions Precautions: Fall Restrictions Weight Bearing Restrictions: No LLE Weight Bearing: Weight bearing as tolerated ADL ADL Equipment Provided: Leg lifter Eating: Modified independent Where Assessed-Eating: Wheelchair Grooming: Modified independent Where Assessed-Grooming: Standing at sink Upper Body Bathing: Modified independent Where Assessed-Upper Body Bathing: Shower Lower Body Bathing: Modified independent Where Assessed-Lower Body Bathing: Shower Upper Body Dressing: Modified independent (Device) Where Assessed-Upper Body Dressing: Wheelchair Lower Body Dressing: Modified independent Where Assessed-Lower Body Dressing: Sitting at sink, Standing at sink Toileting: Modified independent Where Assessed-Toileting: Bedside Commode, Toilet Toilet Transfer: Modified  independent Toilet Transfer Method: Proofreader: Other (comment), Grab bars, Raised toilet seat (RW) Tub/Shower Transfer: Not assessed (NA) Tub/Shower Transfer Method: Unable to assess Film/video editor: Modified independent Film/video editor Method: Designer, industrial/product: Emergency planning/management officer, Grab bars, Other (comment) (RW) Vision Baseline Vision/History: 1 Wears glasses Patient Visual Report: No change from baseline Vision Assessment?: No apparent visual deficits Perception  Perception: Within Functional Limits Praxis Praxis: Intact Cognition Cognition Overall Cognitive Status: Within Functional Limits for tasks assessed Arousal/Alertness: Awake/alert Orientation Level: Person;Place;Situation Person: Oriented Place: Oriented Situation: Oriented Memory: Appears intact Awareness: Appears intact Problem Solving: Appears intact Safety/Judgment: Appears intact Brief Interview for Mental Status (BIMS) Repetition of Three Words (First Attempt): 3 Temporal Orientation: Year: Correct Temporal Orientation: Month: Accurate within 5 days Temporal Orientation: Day: Correct Recall: "Sock": Yes, no cue required Recall: "Blue": Yes, no cue required Recall: "Bed": Yes, no cue required BIMS Summary Score: 15 Sensation Sensation Light Touch: Appears Intact Hot/Cold: Appears Intact Proprioception: Appears Intact Stereognosis: Not tested Coordination Gross Motor Movements are Fluid and Coordinated: Yes Fine Motor Movements are Fluid and Coordinated: Yes Finger Nose Finger Test: slower LUE>RUE Heel Shin Test: WFL on RLE, slower and decreased ROM on LLE but improved since eval Motor  Motor Motor: Within Functional Limits Mobility  Bed Mobility Bed Mobility: Rolling Right;Rolling Left;Sit to Supine;Supine to Sit Rolling Right: Independent with assistive device Rolling Left: Independent with assistive device Supine to Sit: Independent  with assistive device Sit to Supine: Independent with assistive device Transfers Sit to Stand: Independent with assistive device Stand to Sit: Independent with assistive device  Trunk/Postural Assessment  Cervical Assessment Cervical Assessment: Exceptions to [facility] (forward head) Thoracic Assessment Thoracic Assessment: Exceptions to [facility] (flexible kyphosis) Lumbar Assessment Lumbar Assessment: Exceptions to [facility] (posterior pelvic tilt) Postural Control Postural Control: Within Functional Limits  Balance Balance Balance Assessed: Yes Static Sitting Balance Static Sitting - Balance Support:  Feet supported;Bilateral upper extremity supported Static Sitting - Level of Assistance: 7: Independent Dynamic Sitting Balance Dynamic Sitting - Balance Support: Feet supported;No upper extremity supported Dynamic Sitting - Level of Assistance: 6: Modified independent (Device/Increase time) Static Standing Balance Static Standing - Balance Support: Bilateral upper extremity supported;During functional activity (RW) Static Standing - Level of Assistance: 6: Modified independent (Device/Increase time) Dynamic Standing Balance Dynamic Standing - Balance Support: Bilateral upper extremity supported;During functional activity (RW) Dynamic Standing - Level of Assistance: 6: Modified independent (Device/Increase time) Dynamic Standing - Comments: with transfers and gait Extremity/Trunk Assessment RUE Assessment RUE Assessment: Exceptions to [facility] General Strength Comments: 4-/5 grossly LUE Assessment LUE Assessment: Exceptions to [facility] General Strength Comments: 4-/5 grossly  Skilled Intervention: Pt received sitting in [facility] for skilled OT session with focus on BADL participation. Pt agreeable to interventions, demonstrating overall pleasant mood. Pt with no reports of pain, stating "I already took my pain pill." OT offering intermediate rest breaks and positioning suggestions throughout session to address  pain/fatigue and maximize participation/safety in session.   Pt performs all functional transfers with supervision + RW. Pt ambulates in/out of shower with same level of assistance to complete full-shower. Pt requires A to manage thigh-high TEDs, otherwise Mod I for UB dressing and Min A for LB dressing, pt reporting increased stiffness impacting functioning this AM session. Pt performs sink-side standing grooming with supervision.   Pt remained sitting in [facility] with all immediate needs met at end of session. Pt continues to be appropriate for skilled OT intervention to promote further functional independence.   Lou Cal, OTR/L, MSOT   [facility] E Kluttz 04/06/2023, 2:29 PM

## 2023-04-06 NOTE — Progress Notes (Signed)
Occupational Therapy Session Note  Patient Details  Name: Michelle Vazquez MRN: [id number] Date of Birth: [birth year]  Today's Date: 04/06/2023 OT Individual Time: 1020-1100 & 1422-1500 OT Individual Time Calculation (min): 40 min & 38 min    Short Term Goals: Week 2:  OT Short Term Goal 1 (Week 2): STG=LTG's d/t LOS  Skilled Therapeutic Interventions/Progress Updates:      Therapy Documentation Precautions:  Precautions Precautions: Fall Precaution Comments: monitor O2 Restrictions Weight Bearing Restrictions: No LLE Weight Bearing: Weight bearing as tolerated  1st Session General: "I will walk to the gym." Pt seated in W/C upon OT arrival, agreeable to OT.   Pain: Pt reports no pain.    ADL: -Toilet transfer-supervision with FWW  -Toileting- supervision with FWW  Transfers -Pt ambulated room>< therapy gym at supervision level with RW.  -sit to stand for various trials at supervision with RW -Pt completed household ambulating with RW at supervision   IADL Retraining Pt completed simulated simple meal preparation in order to prepare for completing simple meal preparation once DC. Pt overall completed activity at supervision level for retrieving items of various weights at various heights out of pantry and fridge and transporting them one by one to counter top. Pt completed household ambulation distances during task with supervision at [facility] with  no LOB/SOB reported/noted.   Education Pt educated on RW equipment such as walker tray, bag or basket in order for ease of transporting items once DC. Pt shown affordable options for purchase on [employer]. Pt stated she purchased a walker bag but would potentially benefit from owning a walker tray.   Pt seated in W/C at end of session with W/C alarm donned, call light within reach and 4Ps assessed.    2nd Session General: "I will walk to the gym." Pt seated in recliner chair upon OT arrival, agreeable to OT.   Pain: Pt reports no  pain.   Transfers -Pt ambulated room>< therapy gym at supervision level with RW.  -Sit to stand for various trials at supervision with RW  Therapeutic Exercise: Pt completed 10 minutes of arm bike, 5 min forward, 5 min backward in order to increase UE strength and endurance in preparation for increased independence in ADLs such as bathing. Rest break after 5 min, on 4.5 resistance. Decreased resistance to level 3 after 8.5 min. Pt kept slow steady pace throughout exercise, VC for increased speed for last minute.  Therapeutic Activity: Pt ambulated throughout hallway SBA at RW to complete scavenger hunt to retrieve 10 colored cones. Pt retrieved 8/10 without VC, 2 cones required VC to locate. Pt completed activity in order to increase visual scanning, reaching out of BOS, balance and dual task abilities.   Pt seated in recliner at end of session with W/C alarm donned, call light within reach and 4Ps assessed. Granddaughter present.    Therapy/Group: Individual Therapy  Melvyn Novas, MS, OTR/L  04/06/2023, 3:26 PM

## 2023-04-06 NOTE — Progress Notes (Signed)
Patient ID: Michelle Vazquez, female   DOB: July 03, 1929, 89 y.o.   MRN: [id number]  Handicapped placement card application started, Sw waiting on signature to provide to patient.

## 2023-04-06 NOTE — Progress Notes (Signed)
Recreational Therapy Session Note  Patient Details  Name: Michelle Vazquez MRN: [id number] Date of Birth: 1929/06/12 Today's Date: 04/06/2023  Pain: no c/o, premedicated Skilled Therapeutic Interventions/Progress Updates:  Goals:  Pt will problem solve safe ways to complete pet care at home with min cues.  MET Pt will problem solve community reintegration with min questioning cues.  MET  Session focused on discharge planning in regards to reintegrating back into the community as well as pet care.  Pt discussed desire to obtain handicapped placard, SW made aware.  Discussed simple energy conservation techniques for community pursuits and accessibility.  Pt loves her dog [name] and is looking forward to seeing her once home.  Discussed safety concerns with pet ownership during continued recovery including fall hazards, feeding, watering and taking the dog out to use the restroom.  Pt provided with min cues/questions to problem through obstacles and given 2-3 suggestions to aid in pt safety.  Micholas Drumwright 04/06/2023, 12:16 PM

## 2023-04-06 NOTE — Progress Notes (Signed)
Nutrition Follow-up  DOCUMENTATION CODES:   Not applicable  INTERVENTION:  - Add Icecream with meals.   NUTRITION DIAGNOSIS:   Increased nutrient needs related to hip fracture as evidenced by estimated needs.  GOAL:   Patient will meet greater than or equal to 90% of their needs  MONITOR:   PO intake  REASON FOR ASSESSMENT:   Malnutrition Screening Tool    ASSESSMENT:   89 y.o. female admits to CIR related to functional deficits due to L hip fracture. PMH includes: arthritis, afib, bowel obstruction, HLD, osteoporosis.  Meds reviewed: Vit D3, pepcid, ferrous sulfate, colace. Labs reviewed: WDL.   Pt's intakes vary per record. Pt has mostly been eating from 35-75% of her meals over the past 7 days. Pt is eating well considering age. RD will continue to monitor PO intakes. Pt does not want supplements at this time.   Diet Order:   Diet Order             DIET - DYS 1 Room service appropriate? Yes; Fluid consistency: Thin  Diet effective now                   EDUCATION NEEDS:   Not appropriate for education at this time  Skin:  Skin Assessment: Skin Integrity Issues: Skin Integrity Issues:: Incisions Incisions: L hip  Last BM:  5/29 - type 2  Height:   Ht Readings from Last 1 Encounters:  03/27/23 5\' 1"  (1.549 m)    Weight:   Wt Readings from Last 1 Encounters:  04/05/23 51.2 kg    Ideal Body Weight:     BMI:  Body mass index is 21.33 kg/m.  Estimated Nutritional Needs:   Kcal:  1500-1650 kcals  Protein:  55-70 gm  Fluid:  >/= 1.5 L  Bethann Humble, RD, LDN, CNSC.

## 2023-04-06 NOTE — Progress Notes (Signed)
Physical Therapy Session Note  Patient Details  Name: Michelle Vazquez MRN: [id number] Date of Birth: Mar 09, 1929  Today's Date: 04/06/2023 PT Individual Time: 5621-3086 and 5784-6962 PT Individual Time Calculation (min): 71 min and 42 min  Short Term Goals: Week 1:  PT Short Term Goal 1 (Week 1): pt will transfer bed<>chair with LRAD and CGA PT Short Term Goal 2 (Week 1): pt will ambulate 52ft with LRAD and CGA PT Short Term Goal 3 (Week 1): pt will navigate 4 steps with 1 handrail and min A  Skilled Therapeutic Interventions/Progress Updates:   Treatment Session 1 Received pt sitting in WC, pt agreeable to PT treatment, and denied any pain at rest. Session with emphasis on discharge planning, functional mobility/transfers, generalized strengthening and endurance, dynamic standing balance/coordination, simulated car transfers, gait training, and stair navigation. Donned thigh high teds and non skid socks dependently for edema management and MD arrived for morning rounds. Went through sensation, MMT, and pain interference questionnaire and pt performed all transfers with RW and mod I throughout session. Pt ambulated in/out of bathroom with RW and mod I and able to perform 3/3 toileting tasks without assist.   Pt ambulated 75ft with RW and mod I to ortho gym and performed ambulatory simulated car transfer with RW and supervision using UEs to assist LLE into car. Pt then ambulated 80ft on uneven surfaces (ramp and mulch) with RW and mod I and descended 1 5in curb with RW and supervision with cues for technique. Pt ambulated 2ft with RW and mod I to rehab apartment and practiced bed mobility from flat bed without bedrails using leg lifter with mod I and increased time. Pt ambulated additional 7ft with RW and mod I to main therapy gym and dicussed energy conservation strateiges and fall prevention.  Pt stood with RW and mod I and picked up object from floor using reacher and mod I. Ambulated to  staircase and navigated 8 6in steps with L handrail and supervision using a lateral stepping technique. Pt ambulated 134ft with RW and mod I back to room and briefly reviewed HEP. Concluded session with pt sitting in [name] with all needs within reach.    Treatment Session 2 Received pt sitting in WC with [name] present at bedside. Pt agreeable to PT treatment and denied any pain during session. Session with emphasis on functional mobility/transfers, generalized strengthening and endurance, and gait training. Pt performed all transfers with RW and mod I throughout session. Pt ambulated 361ft x 2 trials with RW and mod I fading to close supervision to/from dayroom - pt able to self correct for upright posture/gaze and to relax shoulders. Pt performed BUE/LE strengthening on Nustep at workload 3 for 8 minutes increasing to level 4 for additional 2 minutes for a total of 110 steps with emphasis on L hip strength/ROM and cardiovascular endurance. Discussed having [name] order compression socks for home use for edema control. Returned to room and ambulated in/out of bathroom with RW and mod I. Pt able to perform 3/3 toileting tasks without assist. Concluded session with pt sitting in recliner with all needs within reach.   Therapy Documentation Precautions:  Precautions Precautions: Fall Precaution Comments: monitor O2 Restrictions Weight Bearing Restrictions: Yes LLE Weight Bearing: Weight bearing as tolerated  Therapy/Group: Individual Therapy [name] PT, DPT 04/06/2023, 7:11 AM

## 2023-04-06 NOTE — Progress Notes (Signed)
Recreational Therapy Discharge Summary Patient Details  Name: Michelle Vazquez MRN: [id number] Date of Birth: 09-22-1929 Today's Date: 04/06/2023  Comments on progress toward goals: Pt has made great progress during LOS and is ready and excited about discharge home 6/2 with family to provide supervision/assistance.  TR sessions focused on aspects of wellness including physical, emotional, social & spiritual.  Pt participated in animal assisted activities, with additional sessions focused on discharge planning and safety in regards to leisure interests.  Goals met. Reasons for discharge: discharge from hospital  Follow-up: Home Health  Patient/family agrees with progress made and goals achieved: Yes  Zorianna Taliaferro 04/06/2023, 12:27 PM

## 2023-04-07 ENCOUNTER — Other Ambulatory Visit (HOSPITAL_COMMUNITY): Payer: Self-pay

## 2023-04-07 DIAGNOSIS — E8779 Other fluid overload: Secondary | ICD-10-CM

## 2023-04-07 NOTE — Progress Notes (Signed)
Physical Therapy Session Note  Patient Details  Name: Michelle Vazquez MRN: [id number] Date of Birth: 09/02/1929  Today's Date: 04/07/2023 PT Individual Time: 7829-5621 PT Individual Time Calculation (min): 44 min   Short Term Goals: Week 1:  PT Short Term Goal 1 (Week 1): pt will transfer bed<>chair with LRAD and CGA PT Short Term Goal 2 (Week 1): pt will ambulate 85ft with LRAD and CGA PT Short Term Goal 3 (Week 1): pt will navigate 4 steps with 1 handrail and min A  Skilled Therapeutic Interventions/Progress Updates:     Pt received seated in [facility] and agrees to therapy. No complaint of pain. WC transport to gym. Pt performs sit to stand with RW at mod(I), and ambulates x350' with RW. Pt able to verbalize improvements necessary for gait pattern, and PT provides encouragement to ambulate for endurance training. Pt takes seated rest break. PT provides cues for pursed lip breathing to optimize oxygen sats. Following rest break, pt stands and ambulates x175' with RW. Rest break. Pt transfers to Nustep. Pt completes Nustep for endurance training and strengthening. PT provides cues for hand and foot placement. Pt completes x10:00 at workload of 5 with average steps per minute ~35. Pt ambulates x300' back to room with RW. Left seated in recliner with all needs within reach.   Therapy Documentation Precautions:  Precautions Precautions: Fall Precaution Comments: monitor O2 Restrictions Weight Bearing Restrictions: No LLE Weight Bearing: Weight bearing as tolerated    Therapy/Group: Individual Therapy  Beau Fanny, PT, DPT 04/07/2023, 4:26 PM

## 2023-04-07 NOTE — Progress Notes (Signed)
Inpatient Rehabilitation Discharge Medication Review by a Pharmacist  A complete drug regimen review was completed for this patient to identify any potential clinically significant medication issues.  High Risk Drug Classes Is patient taking? Indication by Medication  Antipsychotic No   Anticoagulant No   Antibiotic No   Opioid Yes Norco- acute pain  Antiplatelet Yes Asa- DVT ppx s/p ortho sugery  Hypoglycemics/insulin No   Vasoactive Medication Yes Tambocor- CAF / PSVT Lasix- fluid overload  Chemotherapy No   Other Yes Pepcid- GERD Trazodone- sleep     Type of Medication Issue Identified Description of Issue Recommendation(s)  Drug Interaction(s) (clinically significant)     Duplicate Therapy     Allergy     No Medication Administration End Date     Incorrect Dose     Additional Drug Therapy Needed     Significant med changes from prior encounter (inform family/care partners about these prior to discharge).    Other       Clinically significant medication issues were identified that warrant physician communication and completion of prescribed/recommended actions by midnight of the next day:  No   Time spent performing this drug regimen review (minutes):  30   Froilan Mclean BS, PharmD, BCPS Clinical Pharmacist 04/07/2023 9:35 AM  Contact: [phone] after 3 PM  "Be curious, not judgmental..." -Debbora Dus

## 2023-04-07 NOTE — Progress Notes (Signed)
PROGRESS NOTE   Subjective/Complaints: Up eating breakfast. Anxious to be going home tomorrow. No complaints today.   ROS: Patient denies fever, rash, sore throat, blurred vision, dizziness, nausea, vomiting, diarrhea, cough, shortness of breath or chest pain,   headache, or mood change.   Objective:   No results found. No results for input(s): "WBC", "HGB", "HCT", "PLT" in the last 72 hours.  No results for input(s): "NA", "K", "CL", "CO2", "GLUCOSE", "BUN", "CREATININE", "CALCIUM" in the last 72 hours.   Intake/Output Summary (Last 24 hours) at 04/06/2023 1425 Last data filed at 04/06/2023 0728 Gross per 24 hour  Intake 240 ml  Output --  Net 240 ml        Physical Exam: Vital Signs Blood pressure (!) 126/59, pulse 94, temperature 98 F (36.7 C), temperature source Oral, resp. rate 18, height 5\' 1"  (1.549 m), weight 52.1 kg, SpO2 97 %. Constitutional: No distress . Vital signs reviewed. HEENT: NCAT, EOMI, oral membranes moist Neck: supple Cardiovascular: RRR without murmur. No JVD    Respiratory/Chest: CTA Bilaterally without wheezes or rales. Normal effort    GI/Abdomen: BS +, non-tender, non-distended Ext: no clubbing, cyanosis, or edema Psych: pleasant and cooperative  Skin: intact Musculoskeletal:     Cervical back: Neck supple. No tenderness.     Comments: Left hip incision with surgical foam dressing.   UE strength 5-/5 throughout B/L RLE 5-/5 and LLE 3-/5 in HF, otherwise 4-/5 - pain inhibits knee ext still Skin:    General: Skin is warm and dry.  Neurological:     Mental Status: She is alert and oriented to person, place, and time.     Comments: Intact to light touch in all  4 extremities    Assessment/Plan: 1. Functional deficits which require 3+ hours per day of interdisciplinary therapy in a comprehensive inpatient rehab setting. Physiatrist is providing close team supervision and 24 hour  management of active medical problems listed below. Physiatrist and rehab team continue to assess barriers to discharge/monitor patient progress toward functional and medical goals  Care Tool:  Bathing    Body parts bathed by patient: Right arm, Left arm, Chest, Abdomen, Right upper leg, Left upper leg, Right lower leg, Face, Front perineal area, Buttocks   Body parts bathed by helper: Front perineal area, Buttocks Body parts n/a: Left lower leg   Bathing assist Assist Level: Minimal Assistance - Patient > 75%     Upper Body Dressing/Undressing Upper body dressing   What is the patient wearing?: Pull over shirt    Upper body assist Assist Level: Set up assist    Lower Body Dressing/Undressing Lower body dressing      What is the patient wearing?: Pants, Incontinence brief     Lower body assist Assist for lower body dressing: Moderate Assistance - Patient 50 - 74%     Toileting Toileting    Toileting assist Assist for toileting: Supervision/Verbal cueing Assistive Device Comment: walker   Transfers Chair/bed transfer  Transfers assist     Chair/bed transfer assist level: Independent with assistive device Chair/bed transfer assistive device: Geologist, engineering   Ambulation assist      Assist level:  Independent with assistive device Assistive device: Walker-rolling Max distance: 177ft   Walk 10 feet activity   Assist     Assist level: Independent with assistive device Assistive device: Walker-rolling   Walk 50 feet activity   Assist Walk 50 feet with 2 turns activity did not occur: Safety/medical concerns (weakness/deconditioning, fatigue)  Assist level: Independent with assistive device Assistive device: Walker-rolling    Walk 150 feet activity   Assist Walk 150 feet activity did not occur: Safety/medical concerns (weakness/deconditioning, fatigue)  Assist level: Independent with assistive device Assistive device:  Walker-rolling    Walk 10 feet on uneven surface  activity   Assist Walk 10 feet on uneven surfaces activity did not occur: Safety/medical concerns (weakness/deconditioning, fatigue)   Assist level: Independent with assistive device Assistive device: Walker-rolling   Wheelchair     Assist Is the patient using a wheelchair?: No Type of Wheelchair: Manual Wheelchair activity did not occur: N/A  Wheelchair assist level: Dependent - Patient 0% Max wheelchair distance: 65ft    Wheelchair 50 feet with 2 turns activity    Assist    Wheelchair 50 feet with 2 turns activity did not occur: N/A   Assist Level: Dependent - Patient 0%   Wheelchair 150 feet activity     Assist  Wheelchair 150 feet activity did not occur: N/A   Assist Level: Dependent - Patient 0%   Blood pressure (!) 126/59, pulse 94, temperature 98 F (36.7 C), temperature source Oral, resp. rate 18, height 5\' 1"  (1.549 m), weight 52.1 kg, SpO2 97 %.    Medical Problem List and Plan: 1. Functional deficits secondary to L femoral neck fx s/p hemiarthroplasty             -patient may  shower with covering incision             -ELOS/Goals: 12 days mod I   Grounds pass ordered   -Est discharge 6/3  F/u with ortho and PM&R upon discharge. Will discuss with ortho whether they would like to remove her dressing outpatient or if we an remove her dressing here     2.  Impaired mobility, therapy notes reviewed and ambulating 125 feet -DVT/anticoagulation:  SCDs/TEDs/ASA              -antiplatelet therapy: ASA 81 mg po BID  3. Pain Management: Hydrocodone prn vs tylenol prn. Recommended avoiding exercises that aggravate her pain. Provided with pain relief journal  4. Mood/Behavior/Sleep: LCSW to follow for evaluation and support.              -antipsychotic agents: N/A  5. Neuropsych/cognition: This patient is capable of making decisions on her own behalf.  6. Skin/Wound Care: Routine pressure relief  measures.   7. Fluids/Electrolytes/Nutrition:  Monitor I/O.   8. Left hip subcapital femoral neck  fx s/p anterior hemi: WBAT. Continue post-op dressing until outpatient ortho f/u, discussed with patient  9. Aspiration PNA: Continues to have frequent cough but it has much improved-->daily weights ordered to r/o fluid overload             --Completed Unasyn D# 3/5-7?             -weaned off O2  -discussed signs and symptoms of a pneumonia that she should be aware of  -doing well on RA. Reassured pt that PNA treated 10. Severe GERD: On Protonix IV BID-->will change to oral route 11. Chronic dysphagia/Esophageal stricture: Is able to modify diet             --  last swallow study without evidence of aspiration and no change in stricture for past 4 years             --does not like Ensure--make her own shakes at home. Will try [employer] supplement. Pt CAN use nectar thick liquids if she wants, however can stay on thin liquids.   -  SLP continuing dys 3/thin although she will need to avoid most solid foods. She's taking small bites 12. CAF: Monitor HR TID--continue Tambocor  HR controlled Vitals:   04/06/23 0540 04/06/23 1345  BP: 128/70 (!) 126/59  Pulse: 96 94  Resp: 18 18  Temp: 98.3 F (36.8 C) 98 F (36.7 C)  SpO2: 97% 97%    13. ABLA: Recheck CBC in am. 14. Fluid overload: Monitor weight daily. Encourage low salt diet. Current weight reviewed and is 18.29  -Wt appears stable   Filed Weights   04/03/23 0612 04/05/23 0500 04/06/23 0617  Weight: 48.2 kg 51.2 kg 52.1 kg    15. Underweight: provided dietary education  -5/24 Nutrition adding ice cream to meals  16. Difficulty swallowing 2/2 chronic esophageal stricture: SLP ordered, GI consulted  17. Small left pleural effusion: 20mg  IV Lasix ordered. Weight reviewed 5/28 and is 48.2kg, is 51.2 kg on 5/30, will give 20 oral Lasix on 5/30,  send home with prn lasix for shortness of breath  18. Suboptimal vitamin D: continue  liquid vitamin D   19. Reduced TIBC: continue iron supplement ordered  20. Insomnia  -5/24 will try trazodone PRN HS, DC melatonin  21. Chronic congestion: Mucinex d/ced due to patient's dysphagia and difficulty swallowing her pills, continue robitussin prn, discussed that she prefers to use less medication.   22. Bilateral mild swelling in ankles: lasix 20mg  one time ordered 5/31     LOS: 10 days A FACE TO FACE EVALUATION WAS PERFORMED  Krutika P Raulkar 04/06/2023, 2:25 PM

## 2023-04-07 NOTE — Plan of Care (Signed)
  Problem: RH Balance Goal: LTG Patient will maintain dynamic standing with ADLs (OT) Description: LTG:  Patient will maintain dynamic standing balance with assist during activities of daily living (OT)  Outcome: Completed/Met   Problem: Sit to Stand Goal: LTG:  Patient will perform sit to stand in prep for activites of daily living with assistance level (OT) Description: LTG:  Patient will perform sit to stand in prep for activites of daily living with assistance level (OT) Outcome: Completed/Met   Problem: RH Bathing Goal: LTG Patient will bathe all body parts with assist levels (OT) Description: LTG: Patient will bathe all body parts with assist levels (OT) Outcome: Completed/Met   Problem: RH Dressing Goal: LTG Patient will perform lower body dressing w/assist (OT) Description: LTG: Patient will perform lower body dressing with assist, with/without cues in positioning using equipment (OT) Outcome: Completed/Met   Problem: RH Toileting Goal: LTG Patient will perform toileting task (3/3 steps) with assistance level (OT) Description: LTG: Patient will perform toileting task (3/3 steps) with assistance level (OT)  Outcome: Completed/Met   Problem: RH Simple Meal Prep Goal: LTG Patient will perform simple meal prep w/assist (OT) Description: LTG: Patient will perform simple meal prep with assistance, with/without cues (OT). Outcome: Completed/Met   Problem: RH Toilet Transfers Goal: LTG Patient will perform toilet transfers w/assist (OT) Description: LTG: Patient will perform toilet transfers with assist, with/without cues using equipment (OT) Outcome: Completed/Met   Problem: RH Tub/Shower Transfers Goal: LTG Patient will perform tub/shower transfers w/assist (OT) Description: LTG: Patient will perform tub/shower transfers with assist, with/without cues using equipment (OT) Outcome: Completed/Met   

## 2023-04-07 NOTE — Plan of Care (Signed)
  Problem: Consults Goal: RH GENERAL PATIENT EDUCATION Description: See Patient Education module for education specifics. Outcome: Progressing   Problem: RH BLADDER ELIMINATION Goal: RH STG MANAGE BLADDER WITH ASSISTANCE Description: STG Manage Bladder With toileting Assistance Outcome: Progressing   Problem: RH SKIN INTEGRITY Goal: RH STG ABLE TO PERFORM INCISION/WOUND CARE W/ASSISTANCE Description: STG Able To Perform Incision/Wound Care With min Assistance. Outcome: Progressing   Problem: RH SAFETY Goal: RH STG ADHERE TO SAFETY PRECAUTIONS W/ASSISTANCE/DEVICE Description: STG Adhere to Safety Precautions With cues Assistance/Device. Outcome: Progressing   Problem: RH PAIN MANAGEMENT Goal: RH STG PAIN MANAGED AT OR BELOW PT'S PAIN GOAL Description: < 4 with prns Outcome: Progressing   Problem: RH KNOWLEDGE DEFICIT GENERAL Goal: RH STG INCREASE KNOWLEDGE OF SELF CARE AFTER HOSPITALIZATION Description: Patient and family will be able to manage care at discharge with medications and dietary modifications using educational resources independently Outcome: Progressing

## 2023-04-08 NOTE — Progress Notes (Signed)
PROGRESS NOTE   Subjective/Complaints: Pt coughing a little bit this morning. Feels that it's residual from pneumonia. No fevers, chills  ROS: Patient denies fever, rash, sore throat, blurred vision, dizziness, nausea, vomiting, diarrhea,  shortness of breath or chest pain, joint or back/neck pain, headache, or mood change.   Objective:   No results found. No results for input(s): "WBC", "HGB", "HCT", "PLT" in the last 72 hours.  No results for input(s): "NA", "K", "CL", "CO2", "GLUCOSE", "BUN", "CREATININE", "CALCIUM" in the last 72 hours.   Intake/Output Summary (Last 24 hours) at 04/08/2023 0840 Last data filed at 04/08/2023 0813 Gross per 24 hour  Intake 700 ml  Output --  Net 700 ml        Physical Exam: Vital Signs Blood pressure 121/72, pulse 85, temperature 98.5 F (36.9 C), temperature source Oral, resp. rate 18, height 5\' 1"  (1.549 m), weight 51.5 kg, SpO2 95 %. Constitutional: No distress . Vital signs reviewed. HEENT: NCAT, EOMI, oral membranes moist Neck: supple Cardiovascular: RRR without murmur. No JVD    Respiratory/Chest: CTA Bilaterally without wheezes or rales. Intermittent cough  GI/Abdomen: BS +, non-tender, non-distended Ext: no clubbing, cyanosis, or edema Psych: pleasant and cooperative  Skin: intact Musculoskeletal:     Cervical back: Neck supple. No tenderness.     Comments: Left hip incision dressed surgical foam dressing.   UE strength 5-/5 throughout B/L RLE 5-/5 and LLE 3-/5 in HF, otherwise 4-/5 - pain inhibits knee ext still Skin:    General: Skin is warm and dry.  Neurological:     Mental Status: Alert and oriented x 3. Normal insight and awareness. Intact Memory. Normal language and speech. Cranial nerve exam unremarkable.        Comments: Intact to light touch in all  4 extremities    Assessment/Plan: 1. Functional deficits which require 3+ hours per day of interdisciplinary  therapy in a comprehensive inpatient rehab setting. Physiatrist is providing close team supervision and 24 hour management of active medical problems listed below. Physiatrist and rehab team continue to assess barriers to discharge/monitor patient progress toward functional and medical goals  Care Tool:  Bathing    Body parts bathed by patient: Right arm, Left arm, Chest, Abdomen, Right upper leg, Left upper leg, Right lower leg, Face, Front perineal area, Buttocks, Left lower leg   Body parts bathed by helper: Front perineal area, Buttocks Body parts n/a: Left lower leg   Bathing assist Assist Level: Independent with assistive device     Upper Body Dressing/Undressing Upper body dressing   What is the patient wearing?: Pull over shirt    Upper body assist Assist Level: Independent with assistive device    Lower Body Dressing/Undressing Lower body dressing      What is the patient wearing?: Pants, Incontinence brief     Lower body assist Assist for lower body dressing: Independent with assitive device Assistive Device Comment: reacher   Toileting Toileting    Toileting assist Assist for toileting: Supervision/Verbal cueing Assistive Device Comment: RW   Transfers Chair/bed transfer  Transfers assist     Chair/bed transfer assist level: Independent with assistive device Chair/bed transfer assistive device: Dan Humphreys  Locomotion Ambulation   Ambulation assist      Assist level: Independent with assistive device Assistive device: Walker-rolling Max distance: 136ft   Walk 10 feet activity   Assist     Assist level: Independent with assistive device Assistive device: Walker-rolling   Walk 50 feet activity   Assist Walk 50 feet with 2 turns activity did not occur: Safety/medical concerns (weakness/deconditioning, fatigue)  Assist level: Independent with assistive device Assistive device: Walker-rolling    Walk 150 feet activity   Assist Walk 150  feet activity did not occur: Safety/medical concerns (weakness/deconditioning, fatigue)  Assist level: Independent with assistive device Assistive device: Walker-rolling    Walk 10 feet on uneven surface  activity   Assist Walk 10 feet on uneven surfaces activity did not occur: Safety/medical concerns (weakness/deconditioning, fatigue)   Assist level: Independent with assistive device Assistive device: Walker-rolling   Wheelchair     Assist Is the patient using a wheelchair?: No Type of Wheelchair: Manual Wheelchair activity did not occur: N/A  Wheelchair assist level: Dependent - Patient 0% Max wheelchair distance: 40ft    Wheelchair 50 feet with 2 turns activity    Assist    Wheelchair 50 feet with 2 turns activity did not occur: N/A   Assist Level: Dependent - Patient 0%   Wheelchair 150 feet activity     Assist  Wheelchair 150 feet activity did not occur: N/A   Assist Level: Dependent - Patient 0%   Blood pressure 121/72, pulse 85, temperature 98.5 F (36.9 C), temperature source Oral, resp. rate 18, height 5\' 1"  (1.549 m), weight 51.5 kg, SpO2 95 %.    Medical Problem List and Plan: 1. Functional deficits secondary to L femoral neck fx s/p hemiarthroplasty             -DC home today  F/u with ortho and PM&R upon discharge.  Encouraged pt to call if she has any issues once home     2.  Impaired mobility, therapy notes reviewed and ambulating 125 feet -DVT/anticoagulation:  SCDs/TEDs/ASA              -antiplatelet therapy: ASA 81 mg po BID  3. Pain Management: Hydrocodone prn vs tylenol prn. Recommended avoiding exercises that aggravate her pain. Provided with pain relief journal  4. Mood/Behavior/Sleep: LCSW to follow for evaluation and support.              -antipsychotic agents: N/A  5. Neuropsych/cognition: This patient is capable of making decisions on her own behalf.  6. Skin/Wound Care: Routine pressure relief measures.   7.  Fluids/Electrolytes/Nutrition:  Monitor I/O.   8. Left hip subcapital femoral neck  fx s/p anterior hemi: WBAT. Continue post-op dressing until outpatient ortho f/u, discussed with patient  9. Aspiration PNA: Continues to have frequent cough but it has much improved-->daily weights ordered to r/o fluid overload             --Completed Unasyn               -weaned off O2  -discussed signs and symptoms of a pneumonia that she should be aware of  -6/2 still doing well on RA. Reassured pt that PNA treated   -encouraged her to cough and bring up any secretions.    -needs to be careful with swallowing per #11 10. Severe GERD: On Protonix IV BID-->will change to oral route 11. Chronic dysphagia/Esophageal stricture: Is able to modify diet             --  last swallow study without evidence of aspiration and no change in stricture for past 4 years             --does not like Ensure--make her own shakes at home. Will try [employer] supplement. Pt CAN use nectar thick liquids if she wants, however can stay on thin liquids.   -  SLP recs continuing dys 3/thin although she will need to avoid most solid foods. She's taking small bites and using appropriate precautions.  12. CAF: Monitor HR TID--continue Tambocor  HR controlled Vitals:   04/07/23 2008 04/08/23 0446  BP: 136/65 121/72  Pulse: 91 85  Resp: 17 18  Temp: 98.4 F (36.9 C) 98.5 F (36.9 C)  SpO2: 96% 95%    13. ABLA: Recheck CBC in am. 14. Fluid overload: Monitor weight daily. Encourage low salt diet. Current weight reviewed and is 18.29  -Wt appears stable   Filed Weights   04/06/23 0617 04/07/23 0500 04/08/23 0536  Weight: 52.1 kg 51.8 kg 51.5 kg    15. Underweight: provided dietary education  -5/24 Nutrition adding ice cream to meals  16. Difficulty swallowing 2/2 chronic esophageal stricture: SLP ordered, GI consulted  17. Small left pleural effusion: 20mg  IV Lasix ordered. Weight reviewed 5/28 and is 48.2kg, is 51.2 kg on  5/30, will give 20 oral Lasix on 5/30,  send home with prn lasix for shortness of breath  18. Suboptimal vitamin D: continue liquid vitamin D   19. Reduced TIBC: continue iron supplement ordered  20. Insomnia  -5/24 will try trazodone PRN HS, DC melatonin  21. Chronic congestion: Mucinex d/ced due to patient's dysphagia and difficulty swallowing her pills, continue robitussin prn, discussed that she prefers to use less medication.   -see discussion above       LOS: 12 days A FACE TO FACE EVALUATION WAS PERFORMED  Ranelle Oyster 04/08/2023, 8:40 AM

## 2023-04-08 NOTE — Progress Notes (Signed)
Pharmacy Discharge medications:  Ten medications were delivered to bedside and left with patient.  Confirmed name and date of birth  Patient was afforded an opportunity to discuss medications and have any questions answered.  Time spent at bedside: 30 minutes  Thank you  Greta Doom BS, PharmD, BCPS Clinical Pharmacist 04/08/2023 8:55 AM  Contact: [phone] after 3 PM  "Be curious, not judgmental..." -Debbora Dus

## 2023-04-08 NOTE — Progress Notes (Signed)
INPATIENT REHABILITATION DISCHARGE NOTE   Discharge instructions by: Marissa Nestle PA  Verbalized understanding:Daughter and granddaughter  Skin care/Wound care healing? Patient has foam to left hip r/t hip surgery.  Pain: 0/10   IV's:n/a  Tubes/Drains:n/a   O2:n/a  Safety instructions: Nurse went over fall risk and safety management with patient and family.  Patient belongings: Patient belongings were packed my family and drawers and closets checked by staff   Discharged UJ:WJXB with family  Discharged via:Car  Notes:Nurse went over some of the medications that family had questions about and also referred to family to rehab book that they received. Cletis Media, RN

## 2023-04-08 NOTE — Plan of Care (Signed)
  Problem: Consults Goal: RH GENERAL PATIENT EDUCATION Description: See Patient Education module for education specifics. Outcome: Completed/Met   Problem: RH BLADDER ELIMINATION Goal: RH STG MANAGE BLADDER WITH ASSISTANCE Description: STG Manage Bladder With toileting Assistance Outcome: Completed/Met   Problem: RH SKIN INTEGRITY Goal: RH STG ABLE TO PERFORM INCISION/WOUND CARE W/ASSISTANCE Description: STG Able To Perform Incision/Wound Care With min Assistance. Outcome: Completed/Met   Problem: RH SAFETY Goal: RH STG ADHERE TO SAFETY PRECAUTIONS W/ASSISTANCE/DEVICE Description: STG Adhere to Safety Precautions With cues Assistance/Device. Outcome: Completed/Met   Problem: RH PAIN MANAGEMENT Goal: RH STG PAIN MANAGED AT OR BELOW PT'S PAIN GOAL Description: < 4 with prns Outcome: Completed/Met   Problem: RH KNOWLEDGE DEFICIT GENERAL Goal: RH STG INCREASE KNOWLEDGE OF SELF CARE AFTER HOSPITALIZATION Description: Patient and family will be able to manage care at discharge with medications and dietary modifications using educational resources independently Outcome: Completed/Met

## 2023-04-09 ENCOUNTER — Ambulatory Visit (INDEPENDENT_AMBULATORY_CARE_PROVIDER_SITE_OTHER): Payer: Medicare Other | Admitting: Orthopaedic Surgery

## 2023-04-09 ENCOUNTER — Other Ambulatory Visit: Payer: Self-pay | Admitting: Physician Assistant

## 2023-04-09 ENCOUNTER — Encounter: Payer: Self-pay | Admitting: Orthopaedic Surgery

## 2023-04-09 DIAGNOSIS — Z96649 Presence of unspecified artificial hip joint: Secondary | ICD-10-CM

## 2023-04-09 DIAGNOSIS — S72012D Unspecified intracapsular fracture of left femur, subsequent encounter for closed fracture with routine healing: Secondary | ICD-10-CM | POA: Diagnosis not present

## 2023-04-09 DIAGNOSIS — R636 Underweight: Secondary | ICD-10-CM | POA: Diagnosis not present

## 2023-04-09 DIAGNOSIS — I493 Ventricular premature depolarization: Secondary | ICD-10-CM | POA: Diagnosis not present

## 2023-04-09 DIAGNOSIS — Z96642 Presence of left artificial hip joint: Secondary | ICD-10-CM | POA: Diagnosis not present

## 2023-04-09 DIAGNOSIS — N183 Chronic kidney disease, stage 3 unspecified: Secondary | ICD-10-CM | POA: Diagnosis not present

## 2023-04-09 DIAGNOSIS — K219 Gastro-esophageal reflux disease without esophagitis: Secondary | ICD-10-CM | POA: Diagnosis not present

## 2023-04-09 DIAGNOSIS — I341 Nonrheumatic mitral (valve) prolapse: Secondary | ICD-10-CM | POA: Diagnosis not present

## 2023-04-09 DIAGNOSIS — Z556 Problems related to health literacy: Secondary | ICD-10-CM | POA: Diagnosis not present

## 2023-04-09 DIAGNOSIS — J90 Pleural effusion, not elsewhere classified: Secondary | ICD-10-CM | POA: Diagnosis not present

## 2023-04-09 DIAGNOSIS — D62 Acute posthemorrhagic anemia: Secondary | ICD-10-CM | POA: Diagnosis not present

## 2023-04-09 DIAGNOSIS — M810 Age-related osteoporosis without current pathological fracture: Secondary | ICD-10-CM | POA: Diagnosis not present

## 2023-04-09 DIAGNOSIS — J9601 Acute respiratory failure with hypoxia: Secondary | ICD-10-CM | POA: Diagnosis not present

## 2023-04-09 DIAGNOSIS — I2541 Coronary artery aneurysm: Secondary | ICD-10-CM | POA: Diagnosis not present

## 2023-04-09 DIAGNOSIS — Z85828 Personal history of other malignant neoplasm of skin: Secondary | ICD-10-CM | POA: Diagnosis not present

## 2023-04-09 DIAGNOSIS — Z9181 History of falling: Secondary | ICD-10-CM | POA: Diagnosis not present

## 2023-04-09 DIAGNOSIS — I471 Supraventricular tachycardia, unspecified: Secondary | ICD-10-CM | POA: Diagnosis not present

## 2023-04-09 DIAGNOSIS — N393 Stress incontinence (female) (male): Secondary | ICD-10-CM | POA: Diagnosis not present

## 2023-04-09 DIAGNOSIS — I4891 Unspecified atrial fibrillation: Secondary | ICD-10-CM | POA: Diagnosis not present

## 2023-04-09 DIAGNOSIS — K222 Esophageal obstruction: Secondary | ICD-10-CM | POA: Diagnosis not present

## 2023-04-09 DIAGNOSIS — E785 Hyperlipidemia, unspecified: Secondary | ICD-10-CM | POA: Diagnosis not present

## 2023-04-09 DIAGNOSIS — G4700 Insomnia, unspecified: Secondary | ICD-10-CM | POA: Diagnosis not present

## 2023-04-09 DIAGNOSIS — Z681 Body mass index (BMI) 19 or less, adult: Secondary | ICD-10-CM | POA: Diagnosis not present

## 2023-04-09 DIAGNOSIS — J690 Pneumonitis due to inhalation of food and vomit: Secondary | ICD-10-CM | POA: Diagnosis not present

## 2023-04-09 DIAGNOSIS — R1310 Dysphagia, unspecified: Secondary | ICD-10-CM | POA: Diagnosis not present

## 2023-04-09 NOTE — Progress Notes (Signed)
The patient is 2 weeks status post a left hip hemiarthroplasty through an anterior approach.  The she is 89 years old.  She has been recovering at [facility] rehab and is now transition to home.  Her granddaughter staying with her.  Other families with her as well.  She is very active.  She is ambulating with a rolling walker.  She has been wearing TED hose and taking a baby aspirin twice a day.  Her left hip incision looks good.  The staples have been removed and Steri-Strips applied.  Calf is nice and soft.  She is wearing TED hose.  She can stop her baby aspirin from my standpoint.  She is continuing home health therapy.  They will work on balance and coordination.  We will see her back in 4 weeks to see how she is doing from a mobility standpoint but no x-rays are needed.  All questions and concerns were addressed and answered.

## 2023-04-10 ENCOUNTER — Encounter: Payer: Self-pay | Admitting: Internal Medicine

## 2023-04-10 ENCOUNTER — Telehealth: Payer: Self-pay | Admitting: Orthopedic Surgery

## 2023-04-10 DIAGNOSIS — I2541 Coronary artery aneurysm: Secondary | ICD-10-CM | POA: Diagnosis not present

## 2023-04-10 DIAGNOSIS — Z96642 Presence of left artificial hip joint: Secondary | ICD-10-CM | POA: Insufficient documentation

## 2023-04-10 DIAGNOSIS — J690 Pneumonitis due to inhalation of food and vomit: Secondary | ICD-10-CM | POA: Diagnosis not present

## 2023-04-10 DIAGNOSIS — I4891 Unspecified atrial fibrillation: Secondary | ICD-10-CM | POA: Diagnosis not present

## 2023-04-10 DIAGNOSIS — N183 Chronic kidney disease, stage 3 unspecified: Secondary | ICD-10-CM | POA: Diagnosis not present

## 2023-04-10 DIAGNOSIS — S72012D Unspecified intracapsular fracture of left femur, subsequent encounter for closed fracture with routine healing: Secondary | ICD-10-CM | POA: Diagnosis not present

## 2023-04-10 DIAGNOSIS — J9601 Acute respiratory failure with hypoxia: Secondary | ICD-10-CM | POA: Diagnosis not present

## 2023-04-10 NOTE — Telephone Encounter (Signed)
Tresa Endo from [facility] is calling to request P.T. orders for Michelle Vazquez. They are asking for twice a week x 3 weeks and then once a week x 6 weeks.  She is s/p Left THA/fall.  Kelly's call back # is [phone].

## 2023-04-10 NOTE — Progress Notes (Unsigned)
Subjective:    Patient ID: Michelle Vazquez, female    DOB: 06-27-1929, 89 y.o.   MRN: [id number]     HPI Michelle Vazquez is here for follow up from the hospital  Admitted Hospital  5/15 - 5/21  Rehab inpatient  5/21 - 6/2  She had a mechanical fall at home and injured her left hip - had pain and was unable to bear weight.  Xrays confirmed impaction fracture of let femoral neck.  No other symptoms.   EKG with prolonged QT, PVCs, paroxysmal SVT  Left hip fx: Ortho consulted S/p left hip arthroplasty 5/17 ASA for DVT proph, narcotics for pain Inpatient rehab recommended by PT  Acute resp failure with hypoxia likely due to aspiration PNA: H/o esophageal dysmotility w severe esophageal stricture Cxr - new infiltrates w effusions Started in IV unasyn - after 24 hrs cough improved Bld cx - 1/2 staph epi or minimus - likely contaminate Afeibrile Remained on 1 L O2     Prolonged QT, Paroxysmal afib: Echo EF 55% K kept > 4, mg > 2  HFpEF: Grade 2  DD Euvolemic  BP elevated , no htn BP 160/90  CKD 3a: Stable  Esophageal motility/severe esophageal stricture: Dysphagia 3 diet w ensure  Medications and allergies reviewed with patient and updated if appropriate.  Current Outpatient Medications on File Prior to Visit  Medication Sig Dispense Refill   acetaminophen (TYLENOL) 325 MG tablet Take 1-2 tablets (325-650 mg total) by mouth every 4 (four) hours as needed for mild pain.     aspirin 81 MG chewable tablet Chew 1 tablet (81 mg total) by mouth 2 (two) times daily for 30 days then as directed by MD 90 tablet 0   Calcium-Vitamin D-Vitamin K (VIACTIV CALCIUM PLUS D) 650-12.5-40 MG-MCG-MCG CHEW Chew 1 each by mouth daily.     cholecalciferol (VITAMIN D3) 10 MCG/ML LIQD oral liquid Take 1 mL (400 Units total) by mouth daily. 120 mL 0   famotidine (PEPCID) 20 MG tablet Take 1 tablet (20 mg total) by mouth 2 (two) times daily. 60 tablet 0   ferrous sulfate 220 (44 Fe) MG/5ML  solution Take 6.8 mLs (300 mg total) by mouth daily with breakfast. 204 mL 3   flecainide (TAMBOCOR) 50 MG tablet Take 1.5 tablets (75 mg total) by mouth 2 (two) times daily. Please contact our office to schedule an overdue appointment for future refills. [phone]. Thank you. 1st attempt. 270 tablet 0   furosemide (LASIX) 20 MG tablet Take 1 tablet (20 mg total) by mouth daily as needed. 10 tablet 0   HYDROcodone-acetaminophen (NORCO/VICODIN) 5-325 MG tablet Take 1-2 tablets by mouth every 6 (six) hours as needed for moderate pain. 28 tablet 0   Multiple Vitamin (MULTIVITAMIN WITH MINERALS) TABS tablet Take 1 tablet by mouth daily. gummies     Multiple Vitamins-Minerals (PRESERVISION AREDS 2+MULTI VIT PO) Take 1 capsule by mouth in the morning and at bedtime.     nystatin (MYCOSTATIN) 100000 UNIT/ML suspension Take 5 mLs (500,000 Units total) by mouth 4 (four) times daily. 473 mL 0   polyethylene glycol powder (GLYCOLAX/MIRALAX) 17 GM/SCOOP powder Take 17 g by mouth daily as needed. 238 g 0   traZODone (DESYREL) 50 MG tablet Take 0.5 tablets (25 mg total) by mouth at bedtime as needed for sleep. 10 tablet 0   No current facility-administered medications on file prior to visit.     Review of Systems     Objective:  There were no vitals filed for this visit. BP Readings from Last 3 Encounters:  04/08/23 121/72  03/27/23 (!) 141/78  09/13/22 126/84   Wt Readings from Last 3 Encounters:  04/08/23 113 lb 8.6 oz (51.5 kg)  03/23/23 100 lb (45.4 kg)  09/13/22 100 lb 12.8 oz (45.7 kg)   There is no height or weight on file to calculate BMI.    Physical Exam     Lab Results  Component Value Date   WBC 8.6 04/02/2023   HGB 9.8 (L) 04/02/2023   HCT 30.0 (L) 04/02/2023   PLT 370 04/02/2023   GLUCOSE 98 04/02/2023   CHOL 215 (H) 09/13/2022   TRIG 92.0 09/13/2022   HDL 79.60 09/13/2022   LDLDIRECT 130.1 06/24/2013   LDLCALC 117 (H) 09/13/2022   ALT 10 03/28/2023   AST 18  03/28/2023   NA 137 04/02/2023   K 4.3 04/02/2023   CL 100 04/02/2023   CREATININE 0.88 04/02/2023   BUN 23 04/02/2023   CO2 30 04/02/2023   TSH 1.11 09/13/2022   HGBA1C 5.6 09/13/2022     Assessment & Plan:    See Problem List for Assessment and Plan of chronic medical problems.

## 2023-04-10 NOTE — Telephone Encounter (Signed)
Tresa Endo with [facility] called in needing P.T. orders for Ms. Eves.  They are asking for twice a week x 3 weeks and once a week x 6 weeks, she is s/p left THA/fall.  Kelly's call back # is [phone].

## 2023-04-10 NOTE — Telephone Encounter (Signed)
Verbal order given on voicemail 

## 2023-04-10 NOTE — Patient Instructions (Signed)
      Medications changes include :   none     

## 2023-04-11 ENCOUNTER — Ambulatory Visit (INDEPENDENT_AMBULATORY_CARE_PROVIDER_SITE_OTHER): Payer: Medicare Other | Admitting: Internal Medicine

## 2023-04-11 VITALS — BP 118/74 | HR 80 | Temp 98.0°F | Ht 61.0 in | Wt 104.0 lb

## 2023-04-11 DIAGNOSIS — I480 Paroxysmal atrial fibrillation: Secondary | ICD-10-CM

## 2023-04-11 DIAGNOSIS — Z96642 Presence of left artificial hip joint: Secondary | ICD-10-CM | POA: Diagnosis not present

## 2023-04-11 DIAGNOSIS — N1831 Chronic kidney disease, stage 3a: Secondary | ICD-10-CM | POA: Diagnosis not present

## 2023-04-11 DIAGNOSIS — I493 Ventricular premature depolarization: Secondary | ICD-10-CM

## 2023-04-11 DIAGNOSIS — J690 Pneumonitis due to inhalation of food and vomit: Secondary | ICD-10-CM | POA: Insufficient documentation

## 2023-04-11 DIAGNOSIS — I10 Essential (primary) hypertension: Secondary | ICD-10-CM | POA: Diagnosis not present

## 2023-04-11 DIAGNOSIS — E7849 Other hyperlipidemia: Secondary | ICD-10-CM

## 2023-04-11 DIAGNOSIS — R1314 Dysphagia, pharyngoesophageal phase: Secondary | ICD-10-CM

## 2023-04-11 NOTE — Assessment & Plan Note (Addendum)
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

## 2023-04-11 NOTE — Assessment & Plan Note (Addendum)
Doing well post surgery Has followed up with orthopedics a couple of days ago Doing home PT Has stopped the aspirin DVT prophylaxis Takes pain medication on occasion if needed, but Tylenol if pain is not severe Ambulating with walker Pain controlled

## 2023-04-11 NOTE — Assessment & Plan Note (Signed)
Had episode of aspiration pneumonia secondary to her dysphagia-esophageal dysmotility Treated with Unasyn Required oxygen, but has been weaned off Still has cough and bringing up some mucus, but it has improved No fevers, shortness of breath, wheeze Lungs sound clear Expect cough to continue to improve slowly but if it does not she will call so that we can consider an x-ray

## 2023-04-11 NOTE — Assessment & Plan Note (Signed)
Chronic BP well controlled Currently not on any medication Monitor

## 2023-04-11 NOTE — Assessment & Plan Note (Signed)
Chronic Continue lifestyle control

## 2023-04-11 NOTE — Assessment & Plan Note (Signed)
PVCs On flecainide Following with Dr. Ladona Ridgel

## 2023-04-11 NOTE — Assessment & Plan Note (Addendum)
Chronic Has been stable No blood work today She is eating and drinking normally Has not needed the furosemide, but will use as needed-20 mg

## 2023-04-13 DIAGNOSIS — J9601 Acute respiratory failure with hypoxia: Secondary | ICD-10-CM | POA: Diagnosis not present

## 2023-04-13 DIAGNOSIS — N183 Chronic kidney disease, stage 3 unspecified: Secondary | ICD-10-CM | POA: Diagnosis not present

## 2023-04-13 DIAGNOSIS — J690 Pneumonitis due to inhalation of food and vomit: Secondary | ICD-10-CM | POA: Diagnosis not present

## 2023-04-13 DIAGNOSIS — I2541 Coronary artery aneurysm: Secondary | ICD-10-CM | POA: Diagnosis not present

## 2023-04-13 DIAGNOSIS — S72012D Unspecified intracapsular fracture of left femur, subsequent encounter for closed fracture with routine healing: Secondary | ICD-10-CM | POA: Diagnosis not present

## 2023-04-13 DIAGNOSIS — I4891 Unspecified atrial fibrillation: Secondary | ICD-10-CM | POA: Diagnosis not present

## 2023-04-16 DIAGNOSIS — J9601 Acute respiratory failure with hypoxia: Secondary | ICD-10-CM | POA: Diagnosis not present

## 2023-04-16 DIAGNOSIS — I2541 Coronary artery aneurysm: Secondary | ICD-10-CM | POA: Diagnosis not present

## 2023-04-16 DIAGNOSIS — N183 Chronic kidney disease, stage 3 unspecified: Secondary | ICD-10-CM | POA: Diagnosis not present

## 2023-04-16 DIAGNOSIS — I4891 Unspecified atrial fibrillation: Secondary | ICD-10-CM | POA: Diagnosis not present

## 2023-04-16 DIAGNOSIS — S72012D Unspecified intracapsular fracture of left femur, subsequent encounter for closed fracture with routine healing: Secondary | ICD-10-CM | POA: Diagnosis not present

## 2023-04-16 DIAGNOSIS — J690 Pneumonitis due to inhalation of food and vomit: Secondary | ICD-10-CM | POA: Diagnosis not present

## 2023-04-19 DIAGNOSIS — I4891 Unspecified atrial fibrillation: Secondary | ICD-10-CM | POA: Diagnosis not present

## 2023-04-19 DIAGNOSIS — I2541 Coronary artery aneurysm: Secondary | ICD-10-CM | POA: Diagnosis not present

## 2023-04-19 DIAGNOSIS — N183 Chronic kidney disease, stage 3 unspecified: Secondary | ICD-10-CM | POA: Diagnosis not present

## 2023-04-19 DIAGNOSIS — J9601 Acute respiratory failure with hypoxia: Secondary | ICD-10-CM | POA: Diagnosis not present

## 2023-04-19 DIAGNOSIS — J690 Pneumonitis due to inhalation of food and vomit: Secondary | ICD-10-CM | POA: Diagnosis not present

## 2023-04-19 DIAGNOSIS — S72012D Unspecified intracapsular fracture of left femur, subsequent encounter for closed fracture with routine healing: Secondary | ICD-10-CM | POA: Diagnosis not present

## 2023-04-23 DIAGNOSIS — N183 Chronic kidney disease, stage 3 unspecified: Secondary | ICD-10-CM | POA: Diagnosis not present

## 2023-04-23 DIAGNOSIS — S72012D Unspecified intracapsular fracture of left femur, subsequent encounter for closed fracture with routine healing: Secondary | ICD-10-CM | POA: Diagnosis not present

## 2023-04-23 DIAGNOSIS — I2541 Coronary artery aneurysm: Secondary | ICD-10-CM | POA: Diagnosis not present

## 2023-04-23 DIAGNOSIS — I4891 Unspecified atrial fibrillation: Secondary | ICD-10-CM | POA: Diagnosis not present

## 2023-04-23 DIAGNOSIS — J9601 Acute respiratory failure with hypoxia: Secondary | ICD-10-CM | POA: Diagnosis not present

## 2023-04-23 DIAGNOSIS — J690 Pneumonitis due to inhalation of food and vomit: Secondary | ICD-10-CM | POA: Diagnosis not present

## 2023-04-26 DIAGNOSIS — I2541 Coronary artery aneurysm: Secondary | ICD-10-CM | POA: Diagnosis not present

## 2023-04-26 DIAGNOSIS — S72012D Unspecified intracapsular fracture of left femur, subsequent encounter for closed fracture with routine healing: Secondary | ICD-10-CM | POA: Diagnosis not present

## 2023-04-26 DIAGNOSIS — N183 Chronic kidney disease, stage 3 unspecified: Secondary | ICD-10-CM | POA: Diagnosis not present

## 2023-04-26 DIAGNOSIS — J9601 Acute respiratory failure with hypoxia: Secondary | ICD-10-CM | POA: Diagnosis not present

## 2023-04-26 DIAGNOSIS — J690 Pneumonitis due to inhalation of food and vomit: Secondary | ICD-10-CM | POA: Diagnosis not present

## 2023-04-26 DIAGNOSIS — I4891 Unspecified atrial fibrillation: Secondary | ICD-10-CM | POA: Diagnosis not present

## 2023-04-27 ENCOUNTER — Other Ambulatory Visit (HOSPITAL_COMMUNITY): Payer: Self-pay

## 2023-04-30 DIAGNOSIS — J690 Pneumonitis due to inhalation of food and vomit: Secondary | ICD-10-CM | POA: Diagnosis not present

## 2023-04-30 DIAGNOSIS — S72012D Unspecified intracapsular fracture of left femur, subsequent encounter for closed fracture with routine healing: Secondary | ICD-10-CM | POA: Diagnosis not present

## 2023-04-30 DIAGNOSIS — I2541 Coronary artery aneurysm: Secondary | ICD-10-CM | POA: Diagnosis not present

## 2023-04-30 DIAGNOSIS — J9601 Acute respiratory failure with hypoxia: Secondary | ICD-10-CM | POA: Diagnosis not present

## 2023-04-30 DIAGNOSIS — N183 Chronic kidney disease, stage 3 unspecified: Secondary | ICD-10-CM | POA: Diagnosis not present

## 2023-04-30 DIAGNOSIS — I4891 Unspecified atrial fibrillation: Secondary | ICD-10-CM | POA: Diagnosis not present

## 2023-05-02 ENCOUNTER — Encounter: Payer: Self-pay | Admitting: Physician Assistant

## 2023-05-02 ENCOUNTER — Ambulatory Visit (INDEPENDENT_AMBULATORY_CARE_PROVIDER_SITE_OTHER): Payer: Medicare Other | Admitting: Physician Assistant

## 2023-05-02 DIAGNOSIS — Z96649 Presence of unspecified artificial hip joint: Secondary | ICD-10-CM

## 2023-05-02 NOTE — Progress Notes (Signed)
HPI: Michelle Vazquez comes in today status post left hip hemiarthroplasty.  She states she is doing well.  She has no pain.  She describes what she feels in the hip is feels different.  She was taking some Tylenol but actually stopped this at least a day ago because she did not feel that she needed it.  She was ambulating with a cane outside the home.  She is asking when she can go back to driving.  She has no concerns in regards to the hip.  Physical exam: Left hip good range of motion without pain calf supple nontender.  Surgical incisions well-healed no signs of infection.  Impression: Status post left hip hemiarthroplasty 03/23/2023  Plan: Will have her follow-up with [place] at the 33-month mark we will obtain an AP pelvis lateral view left hip.  Sooner follow-up if she has any questions concerns.  Questions were encouraged and answered today patient and her son-in-law who accompanies her.

## 2023-05-03 DIAGNOSIS — S72012D Unspecified intracapsular fracture of left femur, subsequent encounter for closed fracture with routine healing: Secondary | ICD-10-CM | POA: Diagnosis not present

## 2023-05-03 DIAGNOSIS — I2541 Coronary artery aneurysm: Secondary | ICD-10-CM | POA: Diagnosis not present

## 2023-05-03 DIAGNOSIS — J690 Pneumonitis due to inhalation of food and vomit: Secondary | ICD-10-CM | POA: Diagnosis not present

## 2023-05-03 DIAGNOSIS — N183 Chronic kidney disease, stage 3 unspecified: Secondary | ICD-10-CM | POA: Diagnosis not present

## 2023-05-03 DIAGNOSIS — J9601 Acute respiratory failure with hypoxia: Secondary | ICD-10-CM | POA: Diagnosis not present

## 2023-05-03 DIAGNOSIS — I4891 Unspecified atrial fibrillation: Secondary | ICD-10-CM | POA: Diagnosis not present

## 2023-05-08 DIAGNOSIS — I2541 Coronary artery aneurysm: Secondary | ICD-10-CM | POA: Diagnosis not present

## 2023-05-08 DIAGNOSIS — I4891 Unspecified atrial fibrillation: Secondary | ICD-10-CM | POA: Diagnosis not present

## 2023-05-08 DIAGNOSIS — J690 Pneumonitis due to inhalation of food and vomit: Secondary | ICD-10-CM | POA: Diagnosis not present

## 2023-05-08 DIAGNOSIS — S72012D Unspecified intracapsular fracture of left femur, subsequent encounter for closed fracture with routine healing: Secondary | ICD-10-CM | POA: Diagnosis not present

## 2023-05-08 DIAGNOSIS — N183 Chronic kidney disease, stage 3 unspecified: Secondary | ICD-10-CM | POA: Diagnosis not present

## 2023-05-08 DIAGNOSIS — J9601 Acute respiratory failure with hypoxia: Secondary | ICD-10-CM | POA: Diagnosis not present

## 2023-05-09 DIAGNOSIS — N393 Stress incontinence (female) (male): Secondary | ICD-10-CM | POA: Diagnosis not present

## 2023-05-09 DIAGNOSIS — K219 Gastro-esophageal reflux disease without esophagitis: Secondary | ICD-10-CM | POA: Diagnosis not present

## 2023-05-09 DIAGNOSIS — I2541 Coronary artery aneurysm: Secondary | ICD-10-CM | POA: Diagnosis not present

## 2023-05-09 DIAGNOSIS — E785 Hyperlipidemia, unspecified: Secondary | ICD-10-CM | POA: Diagnosis not present

## 2023-05-09 DIAGNOSIS — G4700 Insomnia, unspecified: Secondary | ICD-10-CM | POA: Diagnosis not present

## 2023-05-09 DIAGNOSIS — S72012D Unspecified intracapsular fracture of left femur, subsequent encounter for closed fracture with routine healing: Secondary | ICD-10-CM | POA: Diagnosis not present

## 2023-05-09 DIAGNOSIS — K222 Esophageal obstruction: Secondary | ICD-10-CM | POA: Diagnosis not present

## 2023-05-09 DIAGNOSIS — Z556 Problems related to health literacy: Secondary | ICD-10-CM | POA: Diagnosis not present

## 2023-05-09 DIAGNOSIS — I471 Supraventricular tachycardia, unspecified: Secondary | ICD-10-CM | POA: Diagnosis not present

## 2023-05-09 DIAGNOSIS — N183 Chronic kidney disease, stage 3 unspecified: Secondary | ICD-10-CM | POA: Diagnosis not present

## 2023-05-09 DIAGNOSIS — I341 Nonrheumatic mitral (valve) prolapse: Secondary | ICD-10-CM | POA: Diagnosis not present

## 2023-05-09 DIAGNOSIS — Z96642 Presence of left artificial hip joint: Secondary | ICD-10-CM | POA: Diagnosis not present

## 2023-05-09 DIAGNOSIS — I4891 Unspecified atrial fibrillation: Secondary | ICD-10-CM | POA: Diagnosis not present

## 2023-05-09 DIAGNOSIS — D62 Acute posthemorrhagic anemia: Secondary | ICD-10-CM | POA: Diagnosis not present

## 2023-05-09 DIAGNOSIS — I493 Ventricular premature depolarization: Secondary | ICD-10-CM | POA: Diagnosis not present

## 2023-05-09 DIAGNOSIS — Z681 Body mass index (BMI) 19 or less, adult: Secondary | ICD-10-CM | POA: Diagnosis not present

## 2023-05-09 DIAGNOSIS — R1310 Dysphagia, unspecified: Secondary | ICD-10-CM | POA: Diagnosis not present

## 2023-05-09 DIAGNOSIS — R636 Underweight: Secondary | ICD-10-CM | POA: Diagnosis not present

## 2023-05-09 DIAGNOSIS — J9601 Acute respiratory failure with hypoxia: Secondary | ICD-10-CM | POA: Diagnosis not present

## 2023-05-09 DIAGNOSIS — Z9181 History of falling: Secondary | ICD-10-CM | POA: Diagnosis not present

## 2023-05-09 DIAGNOSIS — J90 Pleural effusion, not elsewhere classified: Secondary | ICD-10-CM | POA: Diagnosis not present

## 2023-05-09 DIAGNOSIS — Z85828 Personal history of other malignant neoplasm of skin: Secondary | ICD-10-CM | POA: Diagnosis not present

## 2023-05-09 DIAGNOSIS — M810 Age-related osteoporosis without current pathological fracture: Secondary | ICD-10-CM | POA: Diagnosis not present

## 2023-05-09 DIAGNOSIS — J690 Pneumonitis due to inhalation of food and vomit: Secondary | ICD-10-CM | POA: Diagnosis not present

## 2023-05-11 DIAGNOSIS — I4891 Unspecified atrial fibrillation: Secondary | ICD-10-CM | POA: Diagnosis not present

## 2023-05-11 DIAGNOSIS — J9601 Acute respiratory failure with hypoxia: Secondary | ICD-10-CM | POA: Diagnosis not present

## 2023-05-11 DIAGNOSIS — I2541 Coronary artery aneurysm: Secondary | ICD-10-CM | POA: Diagnosis not present

## 2023-05-11 DIAGNOSIS — N183 Chronic kidney disease, stage 3 unspecified: Secondary | ICD-10-CM | POA: Diagnosis not present

## 2023-05-11 DIAGNOSIS — J690 Pneumonitis due to inhalation of food and vomit: Secondary | ICD-10-CM | POA: Diagnosis not present

## 2023-05-11 DIAGNOSIS — S72012D Unspecified intracapsular fracture of left femur, subsequent encounter for closed fracture with routine healing: Secondary | ICD-10-CM | POA: Diagnosis not present

## 2023-05-23 DIAGNOSIS — R1314 Dysphagia, pharyngoesophageal phase: Secondary | ICD-10-CM | POA: Diagnosis not present

## 2023-05-23 DIAGNOSIS — K222 Esophageal obstruction: Secondary | ICD-10-CM | POA: Diagnosis not present

## 2023-05-23 DIAGNOSIS — R1310 Dysphagia, unspecified: Secondary | ICD-10-CM | POA: Diagnosis not present

## 2023-05-24 ENCOUNTER — Telehealth: Payer: Self-pay

## 2023-05-24 NOTE — Telephone Encounter (Signed)
Note completed and faxed

## 2023-05-24 NOTE — Telephone Encounter (Signed)
Dr. Elder Cyphers dental office would like pre medication note/letter faxed to [phone].  Cb# [phone].  Please advise.  Thank you

## 2023-05-28 ENCOUNTER — Encounter
Payer: Medicare Other | Attending: Physical Medicine and Rehabilitation | Admitting: Physical Medicine and Rehabilitation

## 2023-05-28 ENCOUNTER — Encounter: Payer: Self-pay | Admitting: Physical Medicine and Rehabilitation

## 2023-05-28 VITALS — BP 114/70 | HR 85 | Ht 61.0 in | Wt 101.0 lb

## 2023-05-28 DIAGNOSIS — Z79899 Other long term (current) drug therapy: Secondary | ICD-10-CM | POA: Insufficient documentation

## 2023-05-28 DIAGNOSIS — I480 Paroxysmal atrial fibrillation: Secondary | ICD-10-CM | POA: Insufficient documentation

## 2023-05-28 DIAGNOSIS — K047 Periapical abscess without sinus: Secondary | ICD-10-CM | POA: Insufficient documentation

## 2023-05-28 DIAGNOSIS — S72002S Fracture of unspecified part of neck of left femur, sequela: Secondary | ICD-10-CM | POA: Insufficient documentation

## 2023-05-28 DIAGNOSIS — I4891 Unspecified atrial fibrillation: Secondary | ICD-10-CM

## 2023-05-28 DIAGNOSIS — K222 Esophageal obstruction: Secondary | ICD-10-CM | POA: Diagnosis not present

## 2023-05-28 NOTE — Progress Notes (Signed)
Subjective:    Patient ID: Michelle Vazquez, female    DOB: [birth year], 89 y.o.   MRN: [id number]  HPI  1) Hip fracture: -rehab has been going well -not taking any pain medication for her hip  2) Tooth abscess: -has to this removed   -taking some tylenol  3) Polypharmacy: -medications reviewed for her  Pain Inventory Average Pain 0 Pain Right Now 0 My pain is  none  In the last 24 hours, has pain interfered with the following? General activity 0 Relation with others 0 Enjoyment of life 0 What TIME of day is your pain at its worst? varies Sleep (in general) Poor  Pain is worse with:  none Pain improves with:  no pain Relief from Meds:  no pain  walk without assistance how many minutes can you walk? 15 ability to climb steps?  yes do you drive?  yes Do you have any goals in this area?  yes  retired  bladder control problems  xray  Hospital follow up    Family History  Problem Relation Age of Onset   Arthritis Father    Heart disease Father    Hypertension Father    Stroke Father    Heart attack Father    Arthritis Mother    Stroke Mother    Stroke Brother    Diabetes Brother    Hypertension Brother    Colon cancer Sister        in her 66's   Hyperlipidemia Sister    Hypertension Sister    Hypertension Sister    Diabetes Sister    Esophageal cancer Neg Hx    Rectal cancer Neg Hx    Stomach cancer Neg Hx    Social History   Socioeconomic History   Marital status: Widowed    Spouse name: Not on file   Number of children: 4   Years of education: 12+   Highest education level: Not on file  Occupational History   Occupation: Retired    Comment: Regulatory affairs officer   Tobacco Use   Smoking status: Never   Smokeless tobacco: Never  Vaping Use   Vaping status: Never Used  Substance and Sexual Activity   Alcohol use: No   Drug use: No   Sexual activity: Not Currently  Other Topics Concern   Not on file  Social History Narrative   Regular  exercise-sometimes   Caffeine Use-no   married x 2, caregiver of 98yo husband (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) - retired Engineer, maintenance (IT)   Social Determinants of Health   Financial Resource Strain: Low Risk  (06/16/2022)   Overall Financial Resource Strain (CARDIA)    Difficulty of Paying Living Expenses: Not hard at all  Food Insecurity: No Food Insecurity (03/21/2023)   Hunger Vital Sign    Worried About Running Out of Food in the Last Year: Never true    Ran Out of Food in the Last Year: Never true  Transportation Needs: No Transportation Needs (03/21/2023)   PRAPARE - Administrator, Civil Service (Medical): No    Lack of Transportation (Non-Medical): No  Physical Activity: Sufficiently Active (06/16/2022)   Exercise Vital Sign    Days of Exercise per Week: 5 days    Minutes of Exercise per Session: 30 min  Stress: No Stress Concern Present (06/16/2022)   [employer] of Occupational Health - Occupational Stress Questionnaire    Feeling of Stress : Not at all  Social Connections: Moderately Integrated (06/16/2022)  Social Advertising account executive [NHANES]    Frequency of Communication with Friends and Family: More than three times a week    Frequency of Social Gatherings with Friends and Family: Once a week    Attends Religious Services: More than 4 times per year    Active Member of [employer] or Organizations: No    Attends Engineer, structural: More than 4 times per year    Marital Status: Widowed   Past Surgical History:  Procedure Laterality Date   ABDOMINAL HYSTERECTOMY  1989   BREAST BIOPSY  1979   left   CATARACT EXTRACTION     bilateral   ESOPHAGEAL MANOMETRY N/A 11/17/2015   Procedure: ESOPHAGEAL MANOMETRY (EM);  Surgeon: Beverley Fiedler, MD;  Location: [facility];  Service: Gastroenterology;  Laterality: N/A;   ESOPHAGOSCOPY  09/17/2019   Procedure: Cervical Esophagoscopy;  Surgeon: Osborn Coho, MD;  Location: [facility] OR;  Service: ENT;;   EYE SURGERY      LAPAROSCOPIC SMALL BOWEL RESECTION  2000   LARYNGOSCOPY AND ESOPHAGOSCOPY N/A 10/21/2021   Procedure: DIRECT LARYNGOSCOPY AND ESOPHAGOSCOPY WITH DILATION OF ESOPHAGEAL NARROWING;  Surgeon: Osborn Coho, MD;  Location: [facility] OR;  Service: ENT;  Laterality: N/A;   LARYNGOSCOPY AND ESOPHAGOSCOPY N/A 07/26/2022   Procedure: DIRECT LARYNGOSCOPY; ESOPHAGOSCOPY AND ESOPHAGEAL DILATION;  Surgeon: Osborn Coho, MD;  Location: [facility] OR;  Service: ENT;  Laterality: N/A;   MICROLARYNGOSCOPY WITH DILATION N/A 09/17/2019   Procedure: MICROLARYNGOSCOPY WITH DILATION;  Surgeon: Osborn Coho, MD;  Location: [facility] OR;  Service: ENT;  Laterality: N/A;   parathyroid surgery  2008?   left   SHOULDER SURGERY  02/1999   right   SKIN LESION EXCISION  08/1999   left side of face   TOTAL HIP ARTHROPLASTY Left 03/23/2023   Procedure: TOTAL HIP ARTHROPLASTY ANTERIOR APPROACH;  Surgeon: Kathryne Hitch, MD;  Location: [facility];  Service: Orthopedics;  Laterality: Left;   Past Medical History:  Diagnosis Date   Arthritis    Atrial fibrillation (HCC)    Bowel obstruction (HCC) 2000   Cataract    bilateral removed   Complication of anesthesia    hard to wake after hysterectomy, states heart stopped during a Esophagoscopy, but states she has had several surgeries and procedures with anesthesia and has never had that again   Dysrhythmia    HLD (hyperlipidemia)    Hypercontractile esophagus    Mitral valve prolapse    Osteoporosis    declines rx and declines DEXA f/u   Parathyroid adenoma    post parathyroid surgery in October of 2008   Paroxysmal supraventricular tachycardia    Pneumonia    as a child   Premature ventricular contractions (PVCs) (VPCs)    Schatzki's ring 08/2013   Dr. Rhea Belton, s/p dilation 08/2013, ?benefit   Squamous cell carcinoma in situ of skin    face   Urinary incontinence    Ht 5\' 1"  (1.549 m)   BMI 19.65 kg/m   Opioid Risk Score:   Fall Risk Score:  `1  Depression  screen [facility] 2/9     09/13/2022    9:23 AM 06/16/2022    8:52 AM 06/13/2021   11:24 AM 09/08/2020    8:16 AM 09/08/2019    8:00 AM 09/06/2018    9:33 AM 09/04/2017    8:57 AM  Depression screen PHQ 2/9  Decreased Interest 0 0 0 0 0 0 0  Down, Depressed, Hopeless 0 0 0 0 0 0 0  PHQ - 2 Score 0 0 0 0 0 0 0  Altered sleeping      0   Tired, decreased energy      0   Change in appetite      0   Feeling bad or failure about yourself       0   Trouble concentrating      0   Moving slowly or fidgety/restless      0   Suicidal thoughts      0   PHQ-9 Score      0       Review of Systems  All other systems reviewed and are negative.      Objective:   Physical Exam  Gen: no distress, normal appearing HEENT: oral mucosa pink and moist, NCAT Cardio: Reg rate Chest: normal effort, normal rate of breathing Abd: soft, non-distended Ext: no edema Psych: pleasant, normal affect Skin: intact Neuro: Alert and oriented      Assessment & Plan:   1) Hip fracture: -discussed her positive recovery  2) Polypharmacy: -discussed that she can stop her vitamin, iron supplement -recommended getting vitamin D from the sunlight -d/c pepcid  3) Atrial fibrillation; -continue flecanide  4) Tooth abscess: -discussed plan to see dentist tomorrow  5) Esophageal stenosis: -discussed her plan to get throat stretched  -advised patient to call medical records to transfer her notes over to GI

## 2023-06-05 ENCOUNTER — Encounter: Payer: Self-pay | Admitting: Internal Medicine

## 2023-06-05 ENCOUNTER — Ambulatory Visit: Payer: Medicare Other | Attending: Internal Medicine | Admitting: Internal Medicine

## 2023-06-05 VITALS — BP 106/70 | HR 87 | Ht 61.0 in | Wt 100.0 lb

## 2023-06-05 DIAGNOSIS — I493 Ventricular premature depolarization: Secondary | ICD-10-CM | POA: Insufficient documentation

## 2023-06-05 NOTE — Progress Notes (Signed)
HPI Michelle Vazquez returns today for followup of her PVC's and HTN. She is a pleasant 89 yo woman with a h/o palpitations who has been placed on flecainide. Her PVC's have resolved though we had to increase her dose. She had an echo a year ago which was reassuring as was her heart monitor which showed only NS SVT and rare PVC"s and PAC's. In the interim she notes she is working in her yard. She has trouble swallowing solid foods. She is considering another dilation of the esophagus. Allergies  Allergen Reactions   Diltiazem Hcl Other (See Comments)    Rapid heartbeat and elevated blood pressure   Inderal [Propranolol Hcl]     Pt does not remember reaction   Lopressor [Metoprolol Tartrate]     Per the pt' "I felt like I was going to pass out"   Penicillins Diarrhea    "That was 50 years ago"      Current Outpatient Medications  Medication Sig Dispense Refill   acetaminophen (TYLENOL) 325 MG tablet Take 1-2 tablets (325-650 mg total) by mouth every 4 (four) hours as needed for mild pain.     Calcium-Vitamin D-Vitamin K (VIACTIV CALCIUM PLUS D) 650-12.5-40 MG-MCG-MCG CHEW Chew 1 each by mouth daily.     flecainide (TAMBOCOR) 50 MG tablet Take 1.5 tablets (75 mg total) by mouth 2 (two) times daily. Please contact our office to schedule an overdue appointment for future refills. [phone]. Thank you. 1st attempt. 270 tablet 0   Multiple Vitamin (MULTIVITAMIN WITH MINERALS) TABS tablet Take 1 tablet by mouth daily. gummies     Multiple Vitamins-Minerals (PRESERVISION AREDS 2+MULTI VIT PO) Take 1 capsule by mouth in the morning and at bedtime.     cholecalciferol (VITAMIN D3) 10 MCG/ML LIQD oral liquid Take 1 mL (400 Units total) by mouth daily. (Patient not taking: Reported on 06/05/2023) 120 mL 0   Docusate Sodium (COLACE PO) Take by mouth. (Patient not taking: Reported on 06/05/2023)     famotidine (PEPCID) 20 MG tablet Take 1 tablet (20 mg total) by mouth 2 (two) times daily. (Patient  not taking: Reported on 06/05/2023) 60 tablet 0   ferrous sulfate 220 (44 Fe) MG/5ML solution Take 6.8 mLs (300 mg total) by mouth daily with breakfast. (Patient not taking: Reported on 06/05/2023) 204 mL 3   furosemide (LASIX) 20 MG tablet Take 1 tablet (20 mg total) by mouth daily as needed. (Patient not taking: Reported on 06/05/2023) 10 tablet 0   polyethylene glycol powder (GLYCOLAX/MIRALAX) 17 GM/SCOOP powder Take 17 g by mouth daily as needed. (Patient not taking: Reported on 06/05/2023) 238 g 0   No current facility-administered medications for this visit.     Past Medical History:  Diagnosis Date   Arthritis    Atrial fibrillation (HCC)    Bowel obstruction (HCC) 2000   Cataract    bilateral removed   Complication of anesthesia    hard to wake after hysterectomy, states heart stopped during a Esophagoscopy, but states she has had several surgeries and procedures with anesthesia and has never had that again   Dysrhythmia    HLD (hyperlipidemia)    Hypercontractile esophagus    Mitral valve prolapse    Osteoporosis    declines rx and declines DEXA f/u   Parathyroid adenoma    post parathyroid surgery in October of 2008   Paroxysmal supraventricular tachycardia    Pneumonia    as a child   Premature ventricular contractions (  PVCs) (VPCs)    Schatzki's ring 08/2013   Dr. Rhea Belton, s/p dilation 08/2013, ?benefit   Squamous cell carcinoma in situ of skin    face   Urinary incontinence     ROS:   All systems reviewed and negative except as noted in the HPI.   Past Surgical History:  Procedure Laterality Date   ABDOMINAL HYSTERECTOMY  1989   BREAST BIOPSY  1979   left   CATARACT EXTRACTION     bilateral   ESOPHAGEAL MANOMETRY N/A 11/17/2015   Procedure: ESOPHAGEAL MANOMETRY (EM);  Surgeon: Beverley Fiedler, MD;  Location: [facility];  Service: Gastroenterology;  Laterality: N/A;   ESOPHAGOSCOPY  09/17/2019   Procedure: Cervical Esophagoscopy;  Surgeon: Osborn Coho,  MD;  Location: [facility] OR;  Service: ENT;;   EYE SURGERY     LAPAROSCOPIC SMALL BOWEL RESECTION  2000   LARYNGOSCOPY AND ESOPHAGOSCOPY N/A 10/21/2021   Procedure: DIRECT LARYNGOSCOPY AND ESOPHAGOSCOPY WITH DILATION OF ESOPHAGEAL NARROWING;  Surgeon: Osborn Coho, MD;  Location: [facility] OR;  Service: ENT;  Laterality: N/A;   LARYNGOSCOPY AND ESOPHAGOSCOPY N/A 07/26/2022   Procedure: DIRECT LARYNGOSCOPY; ESOPHAGOSCOPY AND ESOPHAGEAL DILATION;  Surgeon: Osborn Coho, MD;  Location: [facility] OR;  Service: ENT;  Laterality: N/A;   MICROLARYNGOSCOPY WITH DILATION N/A 09/17/2019   Procedure: MICROLARYNGOSCOPY WITH DILATION;  Surgeon: Osborn Coho, MD;  Location: [facility] OR;  Service: ENT;  Laterality: N/A;   parathyroid surgery  2008?   left   SHOULDER SURGERY  02/1999   right   SKIN LESION EXCISION  08/1999   left side of face   TOTAL HIP ARTHROPLASTY Left 03/23/2023   Procedure: TOTAL HIP ARTHROPLASTY ANTERIOR APPROACH;  Surgeon: Kathryne Hitch, MD;  Location: [facility];  Service: Orthopedics;  Laterality: Left;     Family History  Problem Relation Age of Onset   Arthritis Father    Heart disease Father    Hypertension Father    Stroke Father    Heart attack Father    Arthritis Mother    Stroke Mother    Stroke Brother    Diabetes Brother    Hypertension Brother    Colon cancer Sister        in her 65's   Hyperlipidemia Sister    Hypertension Sister    Hypertension Sister    Diabetes Sister    Esophageal cancer Neg Hx    Rectal cancer Neg Hx    Stomach cancer Neg Hx      Social History   Socioeconomic History   Marital status: Widowed    Spouse name: Not on file   Number of children: 4   Years of education: 12+   Highest education level: Not on file  Occupational History   Occupation: Retired    Comment: Regulatory affairs officer   Tobacco Use   Smoking status: Never   Smokeless tobacco: Never  Vaping Use   Vaping status: Never Used  Substance and Sexual Activity   Alcohol  use: No   Drug use: No   Sexual activity: Not Currently  Other Topics Concern   Not on file  Social History Narrative   Regular exercise-sometimes   Caffeine Use-no   married x 2, caregiver of 98yo husband (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) - retired Engineer, maintenance (IT)   Social Determinants of Health   Financial Resource Strain: Low Risk  (06/16/2022)   Overall Financial Resource Strain (CARDIA)    Difficulty of Paying Living Expenses: Not hard at all  Food Insecurity: No  Food Insecurity (03/21/2023)   Hunger Vital Sign    Worried About Running Out of Food in the Last Year: Never true    Ran Out of Food in the Last Year: Never true  Transportation Needs: No Transportation Needs (03/21/2023)   PRAPARE - Administrator, Civil Service (Medical): No    Lack of Transportation (Non-Medical): No  Physical Activity: Sufficiently Active (06/16/2022)   Exercise Vital Sign    Days of Exercise per Week: 5 days    Minutes of Exercise per Session: 30 min  Stress: No Stress Concern Present (06/16/2022)   [employer] of Occupational Health - Occupational Stress Questionnaire    Feeling of Stress : Not at all  Social Connections: Moderately Integrated (06/16/2022)   Social Connection and Isolation Panel [NHANES]    Frequency of Communication with Friends and Family: More than three times a week    Frequency of Social Gatherings with Friends and Family: Once a week    Attends Religious Services: More than 4 times per year    Active Member of [employer] or Organizations: No    Attends Engineer, structural: More than 4 times per year    Marital Status: Widowed  Intimate Partner Violence: Not At Risk (03/21/2023)   Humiliation, Afraid, Rape, and Kick questionnaire    Fear of Current or Ex-Partner: No    Emotionally Abused: No    Physically Abused: No    Sexually Abused: No     BP 106/70   Pulse 87   Ht 5\' 1"  (1.549 m)   Wt 100 lb (45.4 kg)   SpO2 98%   BMI 18.89 kg/m   Physical  Exam:  Well appearing NAD HEENT: Unremarkable Neck:  No JVD, no thyromegally Lymphatics:  No adenopathy Back:  No CVA tenderness Lungs:  Clear with no wheezes HEART:  Regular rate rhythm, no murmurs, no rubs, no clicks Abd:  soft, positive bowel sounds, no organomegally, no rebound, no guarding Ext:  2 plus pulses, no edema, no cyanosis, no clubbing Skin:  No rashes no nodules Neuro:  CN II through XII intact, motor grossly intact  DEVICE  Normal device function.  See PaceArt for details.   Assess/Plan:  PVC's - she is still asymptomatic. We will continue her flecainide. 2. HTN - her bp is well controlled. She will continue her current meds.  3. Dysphagia - her weight is essentially unchanged. She will undergo watchful waiting. 4. Dyspnea - her symptoms have improved and she is working in her yard which I recommended she continue.   Michelle Gowda Ying Blankenhorn,MD

## 2023-06-05 NOTE — Patient Instructions (Signed)
Medication Instructions:  Your physician recommends that you continue on your current medications as directed. Please refer to the Current Medication list given to you today.  *If you need a refill on your cardiac medications before your next appointment, please call your pharmacy*  Follow-Up: At [facility], you and your health needs are our priority.  As part of our continuing mission to provide you with exceptional heart care, we have created designated Provider Care Teams.  These Care Teams include your primary Cardiologist (physician) and Advanced Practice Providers (APPs -  Physician Assistants and Nurse Practitioners) who all work together to provide you with the care you need, when you need it.  Your next appointment:   1 year(s)  Provider:   You may see Gregg Taylor, MD or one of the following Advanced Practice Providers on your designated Care Team:   Renee Ursuy, PA-C Michael "Andy" Tillery, PA-C Suzann Riddle, NP Brandi Ollis, NP    

## 2023-06-18 ENCOUNTER — Ambulatory Visit (INDEPENDENT_AMBULATORY_CARE_PROVIDER_SITE_OTHER): Payer: Medicare Other

## 2023-06-18 VITALS — Ht 61.5 in | Wt 100.0 lb

## 2023-06-18 DIAGNOSIS — Z0000 Encounter for general adult medical examination without abnormal findings: Secondary | ICD-10-CM

## 2023-06-18 NOTE — Patient Instructions (Signed)
Michelle Vazquez , Thank you for taking time to come for your Medicare Wellness Visit. I appreciate your ongoing commitment to your health goals. Please review the following plan we discussed and let me know if I can assist you in the future.   Referrals/Orders/Follow-Ups/Clinician Recommendations: Keep up the good work.  Remember you are due for your Tetanus vaccine and you will get this done at your up coming visit with Dr. Lawerance Bach.  You are also due for the 2 dose Shingles vaccine.  Each day, aim for 6 glasses of water, plenty of protein in your diet and try to get up and walk/ stretch every hour for 5-10 minutes at a time.    This is a list of the screening recommended for you and due dates:  Health Maintenance  Topic Date Due   DTaP/Tdap/Td vaccine (2 - Tdap) 11/07/2019   Zoster (Shingles) Vaccine (1 of 2) 11/06/2023*   Flu Shot  02/04/2024*   Medicare Annual Wellness Visit  06/17/2024   Pneumonia Vaccine  Completed   HPV Vaccine  Aged Out   DEXA scan (bone density measurement)  Discontinued   COVID-19 Vaccine  Discontinued  *Topic was postponed. The date shown is not the original due date.    Advanced directives: (Copy Requested) Please bring a copy of your health care power of attorney and living will to the office to be added to your chart at your convenience.  Next Medicare Annual Wellness Visit scheduled for next year: Yes  Preventive Care 41 Years and Older, Female Preventive care refers to lifestyle choices and visits with your health care provider that can promote health and wellness. What does preventive care include? A yearly physical exam. This is also called an annual well check. Dental exams once or twice a year. Routine eye exams. Ask your health care provider how often you should have your eyes checked. Personal lifestyle choices, including: Daily care of your teeth and gums. Regular physical activity. Eating a healthy diet. Avoiding tobacco and drug use. Limiting  alcohol use. Practicing safe sex. Taking low-dose aspirin every day. Taking vitamin and mineral supplements as recommended by your health care provider. What happens during an annual well check? The services and screenings done by your health care provider during your annual well check will depend on your age, overall health, lifestyle risk factors, and family history of disease. Counseling  Your health care provider may ask you questions about your: Alcohol use. Tobacco use. Drug use. Emotional well-being. Home and relationship well-being. Sexual activity. Eating habits. History of falls. Memory and ability to understand (cognition). Work and work Astronomer. Reproductive health. Screening  You may have the following tests or measurements: Height, weight, and BMI. Blood pressure. Lipid and cholesterol levels. These may be checked every 5 years, or more frequently if you are over 52 years old. Skin check. Lung cancer screening. You may have this screening every year starting at age 80 if you have a 30-pack-year history of smoking and currently smoke or have quit within the past 15 years. Fecal occult blood test (FOBT) of the stool. You may have this test every year starting at age 31. Flexible sigmoidoscopy or colonoscopy. You may have a sigmoidoscopy every 5 years or a colonoscopy every 10 years starting at age 46. Hepatitis C blood test. Hepatitis B blood test. Sexually transmitted disease (STD) testing. Diabetes screening. This is done by checking your blood sugar (glucose) after you have not eaten for a while (fasting). You may have this  done every 1-3 years. Bone density scan. This is done to screen for osteoporosis. You may have this done starting at age 45. Mammogram. This may be done every 1-2 years. Talk to your health care provider about how often you should have regular mammograms. Talk with your health care provider about your test results, treatment options, and if  necessary, the need for more tests. Vaccines  Your health care provider may recommend certain vaccines, such as: Influenza vaccine. This is recommended every year. Tetanus, diphtheria, and acellular pertussis (Tdap, Td) vaccine. You may need a Td booster every 10 years. Zoster vaccine. You may need this after age 76. Pneumococcal 13-valent conjugate (PCV13) vaccine. One dose is recommended after age 34. Pneumococcal polysaccharide (PPSV23) vaccine. One dose is recommended after age 4. Talk to your health care provider about which screenings and vaccines you need and how often you need them. This information is not intended to replace advice given to you by your health care provider. Make sure you discuss any questions you have with your health care provider. Document Released: 11/19/2015 Document Revised: 07/12/2016 Document Reviewed: 08/24/2015 Elsevier Interactive Patient Education  2017 [employer].  Fall Prevention in the Home Falls can cause injuries. They can happen to people of all ages. There are many things you can do to make your home safe and to help prevent falls. What can I do on the outside of my home? Regularly fix the edges of walkways and driveways and fix any cracks. Remove anything that might make you trip as you walk through a door, such as a raised step or threshold. Trim any bushes or trees on the path to your home. Use bright outdoor lighting. Clear any walking paths of anything that might make someone trip, such as rocks or tools. Regularly check to see if handrails are loose or broken. Make sure that both sides of any steps have handrails. Any raised decks and porches should have guardrails on the edges. Have any leaves, snow, or ice cleared regularly. Use sand or salt on walking paths during winter. Clean up any spills in your garage right away. This includes oil or grease spills. What can I do in the bathroom? Use night lights. Install grab bars by the toilet  and in the tub and shower. Do not use towel bars as grab bars. Use non-skid mats or decals in the tub or shower. If you need to sit down in the shower, use a plastic, non-slip stool. Keep the floor dry. Clean up any water that spills on the floor as soon as it happens. Remove soap buildup in the tub or shower regularly. Attach bath mats securely with double-sided non-slip rug tape. Do not have throw rugs and other things on the floor that can make you trip. What can I do in the bedroom? Use night lights. Make sure that you have a light by your bed that is easy to reach. Do not use any sheets or blankets that are too big for your bed. They should not hang down onto the floor. Have a firm chair that has side arms. You can use this for support while you get dressed. Do not have throw rugs and other things on the floor that can make you trip. What can I do in the kitchen? Clean up any spills right away. Avoid walking on wet floors. Keep items that you use a lot in easy-to-reach places. If you need to reach something above you, use a strong step stool that  has a grab bar. Keep electrical cords out of the way. Do not use floor polish or wax that makes floors slippery. If you must use wax, use non-skid floor wax. Do not have throw rugs and other things on the floor that can make you trip. What can I do with my stairs? Do not leave any items on the stairs. Make sure that there are handrails on both sides of the stairs and use them. Fix handrails that are broken or loose. Make sure that handrails are as long as the stairways. Check any carpeting to make sure that it is firmly attached to the stairs. Fix any carpet that is loose or worn. Avoid having throw rugs at the top or bottom of the stairs. If you do have throw rugs, attach them to the floor with carpet tape. Make sure that you have a light switch at the top of the stairs and the bottom of the stairs. If you do not have them, ask someone to add  them for you. What else can I do to help prevent falls? Wear shoes that: Do not have high heels. Have rubber bottoms. Are comfortable and fit you well. Are closed at the toe. Do not wear sandals. If you use a stepladder: Make sure that it is fully opened. Do not climb a closed stepladder. Make sure that both sides of the stepladder are locked into place. Ask someone to hold it for you, if possible. Clearly mark and make sure that you can see: Any grab bars or handrails. First and last steps. Where the edge of each step is. Use tools that help you move around (mobility aids) if they are needed. These include: Canes. Walkers. Scooters. Crutches. Turn on the lights when you go into a dark area. Replace any light bulbs as soon as they burn out. Set up your furniture so you have a clear path. Avoid moving your furniture around. If any of your floors are uneven, fix them. If there are any pets around you, be aware of where they are. Review your medicines with your doctor. Some medicines can make you feel dizzy. This can increase your chance of falling. Ask your doctor what other things that you can do to help prevent falls. This information is not intended to replace advice given to you by your health care provider. Make sure you discuss any questions you have with your health care provider. Document Released: 08/19/2009 Document Revised: 03/30/2016 Document Reviewed: 11/27/2014 Elsevier Interactive Patient Education  2017 [employer].

## 2023-06-18 NOTE — Progress Notes (Signed)
Subjective:   Michelle Vazquez is a 89 y.o. female who presents for Medicare Annual (Subsequent) preventive examination.  Visit Complete: Virtual  I connected with  Michelle Vazquez on 06/18/23 by a audio enabled telemedicine application and verified that I am speaking with the correct person using two identifiers.  Patient Location: Home  Provider Location: Office/Clinic  I discussed the limitations of evaluation and management by telemedicine. The patient expressed understanding and agreed to proceed.  Vital Signs: Vital signs are patient reported.   Review of Systems    Cardiac Risk Factors include: advanced age (>71men, >73 women);hypertension;Other (see comment);dyslipidemia, Risk factor comments: CKD, Osteoporosis,     Objective:    Today's Vitals   06/18/23 1118  Weight: 100 lb (45.4 kg)  Height: 5' 1.5" (1.562 m)   Body mass index is 18.59 kg/m.     06/18/2023   11:30 AM 03/27/2023    1:00 PM 03/23/2023    1:57 PM 03/21/2023    9:00 PM 03/21/2023    1:36 PM 07/26/2022    8:54 AM 06/16/2022    8:51 AM  Advanced Directives  Does Patient Have a Medical Advance Directive? Yes Yes Yes Yes Yes Yes Yes  Type of Estate agent of [place];Living will Healthcare Power of [employer] Power of [employer] Power of [employer] Power of Attorney Healthcare Power of Attorney Living will;Healthcare Power of Attorney  Does patient want to make changes to medical advance directive?  No - Patient declined No - Patient declined No - Patient declined No - Patient declined  No - Patient declined  Copy of Healthcare Power of Attorney in Chart? No - copy requested No - copy requested No - copy requested No - copy requested No - copy requested  No - copy requested    Current Medications (verified) Outpatient Encounter Medications as of 06/18/2023  Medication Sig   acetaminophen (TYLENOL) 325 MG tablet Take 1-2 tablets (325-650 mg total) by mouth every  4 (four) hours as needed for mild pain.   Calcium-Vitamin D-Vitamin K (VIACTIV CALCIUM PLUS D) 650-12.5-40 MG-MCG-MCG CHEW Chew 1 each by mouth daily.   flecainide (TAMBOCOR) 50 MG tablet Take 1.5 tablets (75 mg total) by mouth 2 (two) times daily. Please contact our office to schedule an overdue appointment for future refills. [phone]. Thank you. 1st attempt.   Multiple Vitamin (MULTIVITAMIN WITH MINERALS) TABS tablet Take 1 tablet by mouth daily. gummies   Multiple Vitamins-Minerals (PRESERVISION AREDS 2+MULTI VIT PO) Take 1 capsule by mouth in the morning and at bedtime.   cholecalciferol (VITAMIN D3) 10 MCG/ML LIQD oral liquid Take 1 mL (400 Units total) by mouth daily. (Patient not taking: Reported on 06/05/2023)   Docusate Sodium (COLACE PO) Take by mouth. (Patient not taking: Reported on 06/05/2023)   famotidine (PEPCID) 20 MG tablet Take 1 tablet (20 mg total) by mouth 2 (two) times daily. (Patient not taking: Reported on 06/05/2023)   ferrous sulfate 220 (44 Fe) MG/5ML solution Take 6.8 mLs (300 mg total) by mouth daily with breakfast. (Patient not taking: Reported on 06/05/2023)   furosemide (LASIX) 20 MG tablet Take 1 tablet (20 mg total) by mouth daily as needed. (Patient not taking: Reported on 06/05/2023)   polyethylene glycol powder (GLYCOLAX/MIRALAX) 17 GM/SCOOP powder Take 17 g by mouth daily as needed. (Patient not taking: Reported on 06/05/2023)   No facility-administered encounter medications on file as of 06/18/2023.    Allergies (verified) Diltiazem hcl, Inderal [propranolol hcl],  Lopressor [metoprolol tartrate], and Penicillins   History: Past Medical History:  Diagnosis Date   Arthritis    Atrial fibrillation (HCC)    Bowel obstruction (HCC) 2000   Cataract    bilateral removed   Complication of anesthesia    hard to wake after hysterectomy, states heart stopped during a Esophagoscopy, but states she has had several surgeries and procedures with anesthesia and has  never had that again   Dysrhythmia    HLD (hyperlipidemia)    Hypercontractile esophagus    Mitral valve prolapse    Osteoporosis    declines rx and declines DEXA f/u   Parathyroid adenoma    post parathyroid surgery in October of 2008   Paroxysmal supraventricular tachycardia    Pneumonia    as a child   Premature ventricular contractions (PVCs) (VPCs)    Schatzki's ring 08/2013   Dr. Rhea Belton, s/p dilation 08/2013, ?benefit   Squamous cell carcinoma in situ of skin    face   Urinary incontinence    Past Surgical History:  Procedure Laterality Date   ABDOMINAL HYSTERECTOMY  1989   BREAST BIOPSY  1979   left   CATARACT EXTRACTION     bilateral   ESOPHAGEAL MANOMETRY N/A 11/17/2015   Procedure: ESOPHAGEAL MANOMETRY (EM);  Surgeon: Beverley Fiedler, MD;  Location: [facility];  Service: Gastroenterology;  Laterality: N/A;   ESOPHAGOSCOPY  09/17/2019   Procedure: Cervical Esophagoscopy;  Surgeon: Osborn Coho, MD;  [facility] OR;  Service: ENT;;   EYE SURGERY     LAPAROSCOPIC SMALL BOWEL RESECTION  2000   LARYNGOSCOPY AND ESOPHAGOSCOPY N/A 10/21/2021   Procedure: DIRECT LARYNGOSCOPY AND ESOPHAGOSCOPY WITH DILATION OF ESOPHAGEAL NARROWING;  Surgeon: Osborn Coho, MD;  Location: [facility] OR;  Service: ENT;  Laterality: N/A;   LARYNGOSCOPY AND ESOPHAGOSCOPY N/A 07/26/2022   Procedure: DIRECT LARYNGOSCOPY; ESOPHAGOSCOPY AND ESOPHAGEAL DILATION;  Surgeon: Osborn Coho, MD;  Location: [facility] OR;  Service: ENT;  Laterality: N/A;   MICROLARYNGOSCOPY WITH DILATION N/A 09/17/2019   Procedure: MICROLARYNGOSCOPY WITH DILATION;  Surgeon: Osborn Coho, MD;  Location: [facility] OR;  Service: ENT;  Laterality: N/A;   parathyroid surgery  2008?   left   SHOULDER SURGERY  02/1999   right   SKIN LESION EXCISION  08/1999   left side of face   TOTAL HIP ARTHROPLASTY Left 03/23/2023   Procedure: TOTAL HIP ARTHROPLASTY ANTERIOR APPROACH;  Surgeon: Kathryne Hitch, MD;  Location: [facility];  Service:  Orthopedics;  Laterality: Left;   Family History  Problem Relation Age of Onset   Arthritis Father    Heart disease Father    Hypertension Father    Stroke Father    Heart attack Father    Arthritis Mother    Stroke Mother    Stroke Brother    Diabetes Brother    Hypertension Brother    Colon cancer Sister        in her 74's   Hyperlipidemia Sister    Hypertension Sister    Hypertension Sister    Diabetes Sister    Esophageal cancer Neg Hx    Rectal cancer Neg Hx    Stomach cancer Neg Hx    Social History   Socioeconomic History   Marital status: Widowed    Spouse name: Not on file   Number of children: 4   Years of education: 12+   Highest education level: Not on file  Occupational History   Occupation: Retired    Comment: Regulatory affairs officer  Tobacco Use   Smoking status: Never   Smokeless tobacco: Never  Vaping Use   Vaping status: Never Used  Substance and Sexual Activity   Alcohol use: No   Drug use: No   Sexual activity: Not Currently  Other Topics Concern   Not on file  Social History Narrative   Regular exercise-sometimes   Caffeine Use-no   married x 2, caregiver of 98yo husband (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) - retired Teacher, English as a foreign language daughter is now living with her.   Social Determinants of Health   Financial Resource Strain: Low Risk  (06/18/2023)   Overall Financial Resource Strain (CARDIA)    Difficulty of Paying Living Expenses: Not hard at all  Food Insecurity: No Food Insecurity (06/18/2023)   Hunger Vital Sign    Worried About Running Out of Food in the Last Year: Never true    Ran Out of Food in the Last Year: Never true  Transportation Needs: No Transportation Needs (06/18/2023)   PRAPARE - Administrator, Civil Service (Medical): No    Lack of Transportation (Non-Medical): No  Physical Activity: Inactive (06/18/2023)   Exercise Vital Sign    Days of Exercise per Week: 0 days    Minutes of Exercise per Session: 0 min  Stress: No  Stress Concern Present (06/18/2023)   [employer] of Occupational Health - Occupational Stress Questionnaire    Feeling of Stress : Not at all  Social Connections: Moderately Integrated (06/18/2023)   Social Connection and Isolation Panel [NHANES]    Frequency of Communication with Friends and Family: More than three times a week    Frequency of Social Gatherings with Friends and Family: Once a week    Attends Religious Services: More than 4 times per year    Active Member of [employer] or Organizations: Yes    Attends Banker Meetings: 1 to 4 times per year    Marital Status: Widowed    Tobacco Counseling Counseling given: Not Answered   Clinical Intake:  Pre-visit preparation completed: Yes  Pain : No/denies pain     BMI - recorded: 18.59 Nutritional Risks: None Diabetes: No  How often do you need to have someone help you when you read instructions, pamphlets, or other written materials from your doctor or pharmacy?: 1 - Never  Interpreter Needed?: No  Information entered by ::  , RMA   Activities of Daily Living    06/18/2023   11:23 AM 03/27/2023    1:00 PM  In your present state of health, do you have any difficulty performing the following activities:  Hearing? 0 0  Vision? 0 0  Difficulty concentrating or making decisions? 0 0  Walking or climbing stairs? 0 1  Dressing or bathing? 0 1  Doing errands, shopping? 0 0  Preparing Food and eating ? N   Using the Toilet? N   In the past six months, have you accidently leaked urine? Y   Do you have problems with loss of bowel control? N   Managing your Medications? N   Managing your Finances? N   Housekeeping or managing your Housekeeping? N     Patient Care Team: Pincus Sanes, MD as PCP - General (Internal Medicine) Marinus Maw, MD as PCP - Electrophysiology (Cardiology) Cassell Clement, MD (Cardiology) Pyrtle, Carie Caddy, MD (Gastroenterology) Marinus Maw, MD  (Cardiology) [facility] as Referring Physician (Ophthalmology) Genia Del Daisy Blossom, MD as Consulting Physician (Ophthalmology)  Indicate any  recent Medical Services you may have received from other than [facility] providers in the past year (date may be approximate).     Assessment:   This is a routine wellness examination for Araina.  Hearing/Vision screen Hearing Screening - Comments:: Denies hearing difficulties   Vision Screening - Comments:: Wears eyeglasses  Dietary issues and exercise activities discussed:     Goals Addressed             This Visit's Progress    Patient Stated   On track    Continue to maintain my independence and stay active.      Depression Screen    06/18/2023   11:37 AM 05/28/2023    1:55 PM 09/13/2022    9:23 AM 06/16/2022    8:52 AM 06/13/2021   11:24 AM 09/08/2020    8:16 AM 09/08/2019    8:00 AM  PHQ 2/9 Scores  PHQ - 2 Score 0 0 0 0 0 0 0  PHQ- 9 Score 6 6         Fall Risk    06/18/2023   11:30 AM 09/13/2022    9:23 AM 06/16/2022    8:52 AM 06/13/2021   11:21 AM 09/08/2020    8:12 AM  Fall Risk   Falls in the past year? 1 0 0 0 0  Number falls in past yr: 0 0 0 0 0  Injury with Fall? 1 0 0 0 0  Risk for fall due to : Other (Comment) No Fall Risks No Fall Risks No Fall Risks No Fall Risks  Risk for fall due to: Comment Tripped over vacum cord      Follow up Falls evaluation completed;Falls prevention discussed Falls evaluation completed Falls evaluation completed Falls evaluation completed Falls evaluation completed    MEDICARE RISK AT HOME:  Medicare Risk at Home - 06/18/23 1131     Any stairs in or around the home? No    Home free of loose throw rugs in walkways, pet beds, electrical cords, etc? Yes    Adequate lighting in your home to reduce risk of falls? Yes    Life alert? Yes    Use of a cane, walker or w/c? No    Grab bars in the bathroom? Yes    Shower chair or bench in shower? Yes    Elevated toilet  seat or a handicapped toilet? No             TIMED UP AND GO:  Was the test performed?  No    Cognitive Function:        06/18/2023   11:33 AM 06/16/2022    9:03 AM  6CIT Screen  What Year? 0 points 0 points  What month? 0 points 0 points  What time? 0 points 0 points  Count back from 20 0 points 0 points  Months in reverse 0 points 0 points  Repeat phrase 0 points 0 points  Total Score 0 points 0 points    Immunizations Immunization History  Administered Date(s) Administered   Fluad Quad(high Dose 65+) 09/08/2019, 09/08/2020, 09/12/2021   Influenza, High Dose Seasonal PF 08/04/2014, 08/28/2016, 09/04/2017, 09/06/2018   Influenza,inj,Quad PF,6+ Mos 08/04/2013, 08/25/2015   PFIZER(Purple Top)SARS-COV-2 Vaccination 11/27/2019, 12/18/2019, 08/25/2020   PNEUMOCOCCAL CONJUGATE-20 09/12/2021   Pneumococcal Conjugate-13 08/25/2015   Pneumococcal Polysaccharide-23 06/21/2012   Td 11/06/2009   Zoster, Live 06/30/2011    TDAP status: Due, Education has been provided regarding the importance of this vaccine. Advised may receive  this vaccine at local pharmacy or Health Dept. Aware to provide a copy of the vaccination record if obtained from local pharmacy or Health Dept. Verbalized acceptance and understanding.  Flu Vaccine status: Up to date  Pneumococcal vaccine status: Up to date  Covid-19 vaccine status: Completed vaccines  Qualifies for Shingles Vaccine? Yes   Zostavax completed Yes   Shingrix Completed?: No.    Education has been provided regarding the importance of this vaccine. Patient has been advised to call insurance company to determine out of pocket expense if they have not yet received this vaccine. Advised may also receive vaccine at local pharmacy or Health Dept. Verbalized acceptance and understanding.  Screening Tests Health Maintenance  Topic Date Due   DTaP/Tdap/Td (2 - Tdap) 11/07/2019   Zoster Vaccines- Shingrix (1 of 2) 11/06/2023 (Originally  10/18/1948)   INFLUENZA VACCINE  02/04/2024 (Originally 06/07/2023)   Medicare Annual Wellness (AWV)  06/17/2024   Pneumonia Vaccine 40+ Years old  Completed   HPV VACCINES  Aged Out   DEXA SCAN  Discontinued   COVID-19 Vaccine  Discontinued    Health Maintenance  Health Maintenance Due  Topic Date Due   DTaP/Tdap/Td (2 - Tdap) 11/07/2019    Colorectal cancer screening: No longer required.   Mammogram status: No longer required due to age.  Bone Density status: Completed 09/13/2022. Results reflect: Bone density results: OSTEOPOROSIS. Repeat every 2 years.  Lung Cancer Screening: (Low Dose CT Chest recommended if Age 69-80 years, 20 pack-year currently smoking OR have quit w/in 15years.) does not qualify.   Lung Cancer Screening Referral: N/A  Additional Screening:  Hepatitis C Screening: does not qualify;   Vision Screening: Recommended annual ophthalmology exams for early detection of glaucoma and other disorders of the eye. Is the patient up to date with their annual eye exam?  Yes  Who is the provider or what is the name of the office in which the patient attends annual eye exams? Dr. Genia Del If pt is not established with a provider, would they like to be referred to a provider to establish care? Yes .   Dental Screening: Recommended annual dental exams for proper oral hygiene   Community Resource Referral / Chronic Care Management: CRR required this visit?  No   CCM required this visit?  No     Plan:     I have personally reviewed and noted the following in the patient's chart:   Medical and social history Use of alcohol, tobacco or illicit drugs  Current medications and supplements including opioid prescriptions. Patient is not currently taking opioid prescriptions. Functional ability and status Nutritional status Physical activity Advanced directives List of other physicians Hospitalizations, surgeries, and ER visits in previous 12  months Vitals Screenings to include cognitive, depression, and falls Referrals and appointments  In addition, I have reviewed and discussed with patient certain preventive protocols, quality metrics, and best practice recommendations. A written personalized care plan for preventive services as well as general preventive health recommendations were provided to patient.      L , CMA   06/18/2023   After Visit Summary: (MyChart) Due to this being a telephonic visit, the after visit summary with patients personalized plan was offered to patient via MyChart   Nurse Notes: Patient had no concerns today.  She is due for a Tdap and will get that during her up coming visit in December.  Patient stated that she will think about getting her 2 dose Shingles vaccine.  She has  declined any further DEXA's.

## 2023-06-19 DIAGNOSIS — H43813 Vitreous degeneration, bilateral: Secondary | ICD-10-CM | POA: Diagnosis not present

## 2023-06-19 DIAGNOSIS — H35372 Puckering of macula, left eye: Secondary | ICD-10-CM | POA: Diagnosis not present

## 2023-06-19 DIAGNOSIS — H353132 Nonexudative age-related macular degeneration, bilateral, intermediate dry stage: Secondary | ICD-10-CM | POA: Diagnosis not present

## 2023-06-19 DIAGNOSIS — D3131 Benign neoplasm of right choroid: Secondary | ICD-10-CM | POA: Diagnosis not present

## 2023-06-19 DIAGNOSIS — Z961 Presence of intraocular lens: Secondary | ICD-10-CM | POA: Diagnosis not present

## 2023-07-16 DIAGNOSIS — H353131 Nonexudative age-related macular degeneration, bilateral, early dry stage: Secondary | ICD-10-CM | POA: Diagnosis not present

## 2023-07-16 DIAGNOSIS — D3131 Benign neoplasm of right choroid: Secondary | ICD-10-CM | POA: Diagnosis not present

## 2023-08-06 DIAGNOSIS — Z961 Presence of intraocular lens: Secondary | ICD-10-CM | POA: Diagnosis not present

## 2023-08-06 DIAGNOSIS — H353132 Nonexudative age-related macular degeneration, bilateral, intermediate dry stage: Secondary | ICD-10-CM | POA: Diagnosis not present

## 2023-08-06 DIAGNOSIS — H43813 Vitreous degeneration, bilateral: Secondary | ICD-10-CM | POA: Diagnosis not present

## 2023-08-06 DIAGNOSIS — D3131 Benign neoplasm of right choroid: Secondary | ICD-10-CM | POA: Diagnosis not present

## 2023-08-06 DIAGNOSIS — H35372 Puckering of macula, left eye: Secondary | ICD-10-CM | POA: Diagnosis not present

## 2023-08-09 ENCOUNTER — Other Ambulatory Visit: Payer: Self-pay | Admitting: Internal Medicine

## 2023-09-11 DIAGNOSIS — H35372 Puckering of macula, left eye: Secondary | ICD-10-CM | POA: Diagnosis not present

## 2023-09-11 DIAGNOSIS — D3131 Benign neoplasm of right choroid: Secondary | ICD-10-CM | POA: Diagnosis not present

## 2023-09-11 DIAGNOSIS — H353211 Exudative age-related macular degeneration, right eye, with active choroidal neovascularization: Secondary | ICD-10-CM | POA: Diagnosis not present

## 2023-09-11 DIAGNOSIS — Z961 Presence of intraocular lens: Secondary | ICD-10-CM | POA: Diagnosis not present

## 2023-09-11 DIAGNOSIS — H43813 Vitreous degeneration, bilateral: Secondary | ICD-10-CM | POA: Diagnosis not present

## 2023-09-11 DIAGNOSIS — H353122 Nonexudative age-related macular degeneration, left eye, intermediate dry stage: Secondary | ICD-10-CM | POA: Diagnosis not present

## 2023-09-17 DIAGNOSIS — D509 Iron deficiency anemia, unspecified: Secondary | ICD-10-CM | POA: Insufficient documentation

## 2023-09-17 NOTE — Patient Instructions (Addendum)
      Blood work was ordered.   The lab is on the first floor.    Medications changes include :   none    We will look into a medication to help with your bone - it is called Evenity.     Return in about 1 year (around 09/17/2024) for follow up.

## 2023-09-17 NOTE — Progress Notes (Unsigned)
      Subjective:    Patient ID: Michelle Vazquez, female    DOB: October 10, 1929, 89 y.o.   MRN: [id number]     HPI Michelle Vazquez is here for follow up of her chronic medical problems.  03/2023 - left hip fx    Medications and allergies reviewed with patient and updated if appropriate.  Current Outpatient Medications on File Prior to Visit  Medication Sig Dispense Refill   acetaminophen (TYLENOL) 325 MG tablet Take 1-2 tablets (325-650 mg total) by mouth every 4 (four) hours as needed for mild pain.     Calcium-Vitamin D-Vitamin K (VIACTIV CALCIUM PLUS D) 650-12.5-40 MG-MCG-MCG CHEW Chew 1 each by mouth daily.     cholecalciferol (VITAMIN D3) 10 MCG/ML LIQD oral liquid Take 1 mL (400 Units total) by mouth daily. (Patient not taking: Reported on 06/05/2023) 120 mL 0   Docusate Sodium (COLACE PO) Take by mouth. (Patient not taking: Reported on 06/05/2023)     famotidine (PEPCID) 20 MG tablet Take 1 tablet (20 mg total) by mouth 2 (two) times daily. (Patient not taking: Reported on 06/05/2023) 60 tablet 0   ferrous sulfate 220 (44 Fe) MG/5ML solution Take 6.8 mLs (300 mg total) by mouth daily with breakfast. (Patient not taking: Reported on 06/05/2023) 204 mL 3   flecainide (TAMBOCOR) 50 MG tablet TAKE 1 & 1/2 (ONE & ONE-HALF) TABLETS BY MOUTH TWICE DAILY . APPOINTMENT REQUIRED FOR FUTURE REFILLS 270 tablet 2   furosemide (LASIX) 20 MG tablet Take 1 tablet (20 mg total) by mouth daily as needed. (Patient not taking: Reported on 06/05/2023) 10 tablet 0   Multiple Vitamin (MULTIVITAMIN WITH MINERALS) TABS tablet Take 1 tablet by mouth daily. gummies     Multiple Vitamins-Minerals (PRESERVISION AREDS 2+MULTI VIT PO) Take 1 capsule by mouth in the morning and at bedtime.     polyethylene glycol powder (GLYCOLAX/MIRALAX) 17 GM/SCOOP powder Take 17 g by mouth daily as needed. (Patient not taking: Reported on 06/05/2023) 238 g 0   No current facility-administered medications on file prior to visit.     Review of  Systems     Objective:  There were no vitals filed for this visit. BP Readings from Last 3 Encounters:  06/05/23 106/70  05/28/23 114/70  04/11/23 118/74   Wt Readings from Last 3 Encounters:  06/18/23 100 lb (45.4 kg)  06/05/23 100 lb (45.4 kg)  05/28/23 101 lb (45.8 kg)   There is no height or weight on file to calculate BMI.    Physical Exam     Lab Results  Component Value Date   WBC 8.6 04/02/2023   HGB 9.8 (L) 04/02/2023   HCT 30.0 (L) 04/02/2023   PLT 370 04/02/2023   GLUCOSE 98 04/02/2023   CHOL 215 (H) 09/13/2022   TRIG 92.0 09/13/2022   HDL 79.60 09/13/2022   LDLDIRECT 130.1 06/24/2013   LDLCALC 117 (H) 09/13/2022   ALT 10 03/28/2023   AST 18 03/28/2023   NA 137 04/02/2023   K 4.3 04/02/2023   CL 100 04/02/2023   CREATININE 0.88 04/02/2023   BUN 23 04/02/2023   CO2 30 04/02/2023   TSH 1.11 09/13/2022   HGBA1C 5.6 09/13/2022     Assessment & Plan:    See Problem List for Assessment and Plan of chronic medical problems.

## 2023-09-18 ENCOUNTER — Encounter: Payer: Self-pay | Admitting: Internal Medicine

## 2023-09-18 ENCOUNTER — Ambulatory Visit (INDEPENDENT_AMBULATORY_CARE_PROVIDER_SITE_OTHER): Payer: Medicare Other | Admitting: Internal Medicine

## 2023-09-18 VITALS — BP 106/68 | HR 78 | Temp 98.0°F | Ht 61.5 in | Wt 95.0 lb

## 2023-09-18 DIAGNOSIS — I480 Paroxysmal atrial fibrillation: Secondary | ICD-10-CM | POA: Diagnosis not present

## 2023-09-18 DIAGNOSIS — R739 Hyperglycemia, unspecified: Secondary | ICD-10-CM | POA: Diagnosis not present

## 2023-09-18 DIAGNOSIS — Z23 Encounter for immunization: Secondary | ICD-10-CM | POA: Diagnosis not present

## 2023-09-18 DIAGNOSIS — M810 Age-related osteoporosis without current pathological fracture: Secondary | ICD-10-CM

## 2023-09-18 DIAGNOSIS — I10 Essential (primary) hypertension: Secondary | ICD-10-CM

## 2023-09-18 DIAGNOSIS — R5383 Other fatigue: Secondary | ICD-10-CM

## 2023-09-18 DIAGNOSIS — D509 Iron deficiency anemia, unspecified: Secondary | ICD-10-CM

## 2023-09-18 DIAGNOSIS — K222 Esophageal obstruction: Secondary | ICD-10-CM | POA: Diagnosis not present

## 2023-09-18 DIAGNOSIS — N1831 Chronic kidney disease, stage 3a: Secondary | ICD-10-CM

## 2023-09-18 DIAGNOSIS — D649 Anemia, unspecified: Secondary | ICD-10-CM | POA: Insufficient documentation

## 2023-09-18 DIAGNOSIS — E7849 Other hyperlipidemia: Secondary | ICD-10-CM | POA: Diagnosis not present

## 2023-09-18 LAB — CBC WITH DIFFERENTIAL/PLATELET
Basophils Absolute: 0 10*3/uL (ref 0.0–0.1)
Basophils Relative: 0.5 % (ref 0.0–3.0)
Eosinophils Absolute: 0 10*3/uL (ref 0.0–0.7)
Eosinophils Relative: 0.7 % (ref 0.0–5.0)
HCT: 42.2 % (ref 36.0–46.0)
Hemoglobin: 14.4 g/dL (ref 12.0–15.0)
Lymphocytes Relative: 44.7 % (ref 12.0–46.0)
Lymphs Abs: 2.4 10*3/uL (ref 0.7–4.0)
MCHC: 34.1 g/dL (ref 30.0–36.0)
MCV: 94.1 fL (ref 78.0–100.0)
Monocytes Absolute: 0.5 10*3/uL (ref 0.1–1.0)
Monocytes Relative: 9.1 % (ref 3.0–12.0)
Neutro Abs: 2.5 10*3/uL (ref 1.4–7.7)
Neutrophils Relative %: 45 % (ref 43.0–77.0)
Platelets: 271 10*3/uL (ref 150.0–400.0)
RBC: 4.49 Mil/uL (ref 3.87–5.11)
RDW: 13.4 % (ref 11.5–15.5)
WBC: 5.4 10*3/uL (ref 4.0–10.5)

## 2023-09-18 LAB — COMPREHENSIVE METABOLIC PANEL
ALT: 19 U/L (ref 0–35)
AST: 23 U/L (ref 0–37)
Albumin: 4.3 g/dL (ref 3.5–5.2)
Alkaline Phosphatase: 52 U/L (ref 39–117)
BUN: 26 mg/dL — ABNORMAL HIGH (ref 6–23)
CO2: 31 meq/L (ref 19–32)
Calcium: 9.5 mg/dL (ref 8.4–10.5)
Chloride: 99 meq/L (ref 96–112)
Creatinine, Ser: 0.97 mg/dL (ref 0.40–1.20)
GFR: 50.94 mL/min — ABNORMAL LOW (ref >60.00)
Glucose, Bld: 94 mg/dL (ref 70–99)
Potassium: 4.6 meq/L (ref 3.5–5.1)
Sodium: 141 meq/L (ref 135–145)
Total Bilirubin: 0.7 mg/dL (ref 0.2–1.2)
Total Protein: 7.4 g/dL (ref 6.0–8.3)

## 2023-09-18 LAB — LIPID PANEL
Cholesterol: 164 mg/dL (ref 0–200)
HDL: 61.8 mg/dL (ref >39.00)
LDL Cholesterol: 81 mg/dL (ref 0–99)
NonHDL: 102.48
Total CHOL/HDL Ratio: 3
Triglycerides: 106 mg/dL (ref 0.0–149.0)
VLDL: 21.2 mg/dL (ref 0.0–40.0)

## 2023-09-18 LAB — VITAMIN D 25 HYDROXY (VIT D DEFICIENCY, FRACTURES): VITD: 70 ng/mL (ref 30.00–100.00)

## 2023-09-18 LAB — IBC PANEL
Iron: 139 ug/dL (ref 42–145)
Saturation Ratios: 42.1 % (ref 20.0–50.0)
TIBC: 330.4 ug/dL (ref 250.0–450.0)
Transferrin: 236 mg/dL (ref 212.0–360.0)

## 2023-09-18 LAB — HEMOGLOBIN A1C: Hgb A1c MFr Bld: 5.6 % (ref 4.6–6.5)

## 2023-09-18 LAB — FERRITIN: Ferritin: 37 ng/mL (ref 10.0–291.0)

## 2023-09-18 NOTE — Assessment & Plan Note (Signed)
Chronic Check lipid panel Continue lifestyle control

## 2023-09-18 NOTE — Assessment & Plan Note (Signed)
Chronic Check a1c Low sugar / carb diet Stressed regular exercise  

## 2023-09-18 NOTE — Assessment & Plan Note (Signed)
Earlier this year had a hip fracture requiring surgery and was in the hospital Had iron deficiency anemia at that time Check CBC, iron panel

## 2023-09-18 NOTE — Assessment & Plan Note (Signed)
Chronic Was seeing Dr. Stasia Cavalier and having her esophagus stretched Last time was stretch there was no improvement On pured diet and still has difficulty eating Encouraged her to eat as many calories as possible any calorie dense foods Continue protein powder and smoothies

## 2023-09-18 NOTE — Assessment & Plan Note (Addendum)
Chronic Since she was here last she did have a hip fracture Discussed that she is high risk for another fracture Has had osteoporosis for years Continue calcium and vitamin D, multivitamin-advised increasing Viactiv to 2 tabs daily We will check vitamin D level Fall prevention discussed Agrees to try medication-start Evenity monthly injections for 1 year and then will transition to [employer]

## 2023-09-18 NOTE — Assessment & Plan Note (Signed)
Earlier this year-iron deficiency anemia Like related to hip fracture and surgery Check CBC, iron panel Not currently taking iron supplementation

## 2023-09-18 NOTE — Assessment & Plan Note (Addendum)
Chronic Following with cardiology On flecainide 75 mg twice daily CBC, CMP

## 2023-09-18 NOTE — Assessment & Plan Note (Addendum)
Chronic Has been stable CBC, CMP

## 2023-09-18 NOTE — Assessment & Plan Note (Signed)
Subacute Likely multifactorial She had a prolonged hospitalization earlier this year after hip fracture and had pneumonia She had iron deficiency anemia with this hospitalization from surgery She had a recent GI bug All of this is contributing to her fatigue Check basic blood work including CBC and iron levels

## 2023-09-18 NOTE — Assessment & Plan Note (Signed)
Chronic BP well controlled Currently not on any medication Monitor

## 2023-09-19 ENCOUNTER — Ambulatory Visit (INDEPENDENT_AMBULATORY_CARE_PROVIDER_SITE_OTHER): Payer: Medicare Other | Admitting: Orthopaedic Surgery

## 2023-09-19 ENCOUNTER — Other Ambulatory Visit (INDEPENDENT_AMBULATORY_CARE_PROVIDER_SITE_OTHER): Payer: Medicare Other

## 2023-09-19 ENCOUNTER — Encounter: Payer: Self-pay | Admitting: Orthopaedic Surgery

## 2023-09-19 DIAGNOSIS — Z96642 Presence of left artificial hip joint: Secondary | ICD-10-CM

## 2023-09-19 NOTE — Progress Notes (Signed)
The patient is now 6 months status post a left hip hemiarthroplasty secondary to mechanical fall in which she sustained a left femoral neck fracture.  She is 89 years old and will be 24.  She drives on her own and walks without an assistive device.  She reports good range of motion and strength and no issues with her left hip.  I can easily put her left hip through internal and external rotation with no difficulty at all.  Her leg lengths appear near equal.  She is walking with a normal gait.  An AP pelvis and lateral of her left hip shows a well-seated bipolar hip hemiarthroplasty with no complicating features.  At this point follow-up for her hip can be as needed.  She understands that if she does develop any issues not to hesitate to let us know.  All questions and concerns were addressed and answered.

## 2023-09-25 DIAGNOSIS — H353211 Exudative age-related macular degeneration, right eye, with active choroidal neovascularization: Secondary | ICD-10-CM | POA: Diagnosis not present

## 2023-10-01 ENCOUNTER — Encounter (HOSPITAL_BASED_OUTPATIENT_CLINIC_OR_DEPARTMENT_OTHER): Payer: Self-pay

## 2023-10-01 ENCOUNTER — Emergency Department (HOSPITAL_BASED_OUTPATIENT_CLINIC_OR_DEPARTMENT_OTHER): Payer: Medicare Other | Admitting: Radiology

## 2023-10-01 ENCOUNTER — Other Ambulatory Visit: Payer: Self-pay

## 2023-10-01 ENCOUNTER — Emergency Department (HOSPITAL_BASED_OUTPATIENT_CLINIC_OR_DEPARTMENT_OTHER)
Admission: EM | Admit: 2023-10-01 | Discharge: 2023-10-01 | Disposition: A | Discharge status: Home / Self Care | Payer: Medicare Other | Attending: Emergency Medicine | Admitting: Emergency Medicine

## 2023-10-01 DIAGNOSIS — S60410A Abrasion of right index finger, initial encounter: Secondary | ICD-10-CM | POA: Insufficient documentation

## 2023-10-01 DIAGNOSIS — M4184 Other forms of scoliosis, thoracic region: Secondary | ICD-10-CM | POA: Diagnosis not present

## 2023-10-01 DIAGNOSIS — M549 Dorsalgia, unspecified: Secondary | ICD-10-CM | POA: Diagnosis not present

## 2023-10-01 DIAGNOSIS — W19XXXA Unspecified fall, initial encounter: Secondary | ICD-10-CM

## 2023-10-01 DIAGNOSIS — D631 Anemia in chronic kidney disease: Secondary | ICD-10-CM | POA: Insufficient documentation

## 2023-10-01 DIAGNOSIS — S60311A Abrasion of right thumb, initial encounter: Secondary | ICD-10-CM | POA: Diagnosis not present

## 2023-10-01 DIAGNOSIS — S60511A Abrasion of right hand, initial encounter: Secondary | ICD-10-CM | POA: Diagnosis not present

## 2023-10-01 DIAGNOSIS — S60416A Abrasion of right little finger, initial encounter: Secondary | ICD-10-CM | POA: Diagnosis not present

## 2023-10-01 DIAGNOSIS — S6991XA Unspecified injury of right wrist, hand and finger(s), initial encounter: Secondary | ICD-10-CM | POA: Diagnosis present

## 2023-10-01 DIAGNOSIS — W1839XA Other fall on same level, initial encounter: Secondary | ICD-10-CM | POA: Diagnosis not present

## 2023-10-01 DIAGNOSIS — Z23 Encounter for immunization: Secondary | ICD-10-CM | POA: Insufficient documentation

## 2023-10-01 DIAGNOSIS — M79642 Pain in left hand: Secondary | ICD-10-CM | POA: Diagnosis not present

## 2023-10-01 DIAGNOSIS — S60412A Abrasion of right middle finger, initial encounter: Secondary | ICD-10-CM | POA: Insufficient documentation

## 2023-10-01 DIAGNOSIS — S60512A Abrasion of left hand, initial encounter: Secondary | ICD-10-CM | POA: Diagnosis not present

## 2023-10-01 DIAGNOSIS — S60414A Abrasion of right ring finger, initial encounter: Secondary | ICD-10-CM | POA: Insufficient documentation

## 2023-10-01 DIAGNOSIS — I509 Heart failure, unspecified: Secondary | ICD-10-CM | POA: Insufficient documentation

## 2023-10-01 DIAGNOSIS — M546 Pain in thoracic spine: Secondary | ICD-10-CM | POA: Diagnosis not present

## 2023-10-01 DIAGNOSIS — Z043 Encounter for examination and observation following other accident: Secondary | ICD-10-CM | POA: Diagnosis not present

## 2023-10-01 DIAGNOSIS — M19041 Primary osteoarthritis, right hand: Secondary | ICD-10-CM | POA: Diagnosis not present

## 2023-10-01 DIAGNOSIS — M4804 Spinal stenosis, thoracic region: Secondary | ICD-10-CM | POA: Diagnosis not present

## 2023-10-01 DIAGNOSIS — N189 Chronic kidney disease, unspecified: Secondary | ICD-10-CM | POA: Insufficient documentation

## 2023-10-01 DIAGNOSIS — R29890 Loss of height: Secondary | ICD-10-CM | POA: Diagnosis not present

## 2023-10-01 DIAGNOSIS — M1812 Unilateral primary osteoarthritis of first carpometacarpal joint, left hand: Secondary | ICD-10-CM | POA: Diagnosis not present

## 2023-10-01 DIAGNOSIS — M1811 Unilateral primary osteoarthritis of first carpometacarpal joint, right hand: Secondary | ICD-10-CM | POA: Diagnosis not present

## 2023-10-01 DIAGNOSIS — S60419A Abrasion of unspecified finger, initial encounter: Secondary | ICD-10-CM

## 2023-10-01 MED ORDER — ACETAMINOPHEN 325 MG PO TABS
650.0000 mg | ORAL_TABLET | Freq: Once | ORAL | Status: AC
Start: 2023-10-01 — End: 2023-10-01
  Administered 2023-10-01: 650 mg via ORAL
  Filled 2023-10-01: qty 2

## 2023-10-01 MED ORDER — TETANUS-DIPHTH-ACELL PERTUSSIS 5-2.5-18.5 LF-MCG/0.5 IM SUSY
0.5000 mL | PREFILLED_SYRINGE | Freq: Once | INTRAMUSCULAR | Status: AC
  Administered 2023-10-01: 0.5 mL via INTRAMUSCULAR
  Filled 2023-10-01: qty 0.5

## 2023-10-01 NOTE — Discharge Instructions (Signed)
Take Tylenol as needed for pain.  Keep areas of wounds clean and dry.  If areas of wounds become increasingly painful, swollen, red, or begin draining pus, return to the emergency department.  Return to the emergency department for any other new or worsening symptoms of concern.

## 2023-10-01 NOTE — ED Provider Notes (Signed)
[place] [facility]MENT AT [facility] Provider Note   CSN: [id number] Arrival date & time: 10/01/23  5366     History  Chief Complaint  Patient presents with   Mckalyn Willenberg is a 89 y.o. female.  HPI Patient presents for fall.  Medical history includes CKD, HLD, arthritis, SVT, atrial fibrillation, CHF, anemia.  Fall occurred yesterday.  At the time, she was pushing a trash bin full of leaves.  As she was pushing it, been got away from her.  The lid came down onto her fingers and she fell onto the concrete ground.  She sustained wounds to both hands.  She bandaged these at home.  Since that time, she has had pain in her fingers as well as pain in her thoracic back.  She denies any other recent areas of discomfort.  She states that she is due for her tetanus shot.  She is not on blood thinners.    Home Medications Prior to Admission medications   Medication Sig Start Date End Date Taking? Authorizing Provider  Calcium-Vitamin D-Vitamin K (VIACTIV CALCIUM PLUS D) 650-12.5-40 MG-MCG-MCG CHEW Chew 1 each by mouth daily.    [provider]  flecainide (TAMBOCOR) 50 MG tablet TAKE 1 & 1/2 (ONE & ONE-HALF) TABLETS BY MOUTH TWICE DAILY . APPOINTMENT REQUIRED FOR FUTURE REFILLS 08/09/23   Marinus Maw, MD  Multiple Vitamin (MULTIVITAMIN WITH MINERALS) TABS tablet Take 1 tablet by mouth daily. gummies    [provider]  Multiple Vitamins-Minerals (PRESERVISION AREDS 2+MULTI VIT PO) Take 1 capsule by mouth in the morning and at bedtime.    [provider]      Allergies    Diltiazem hcl, Inderal [propranolol hcl], Lopressor [metoprolol tartrate], and Penicillins    Review of Systems   Review of Systems  Musculoskeletal:  Positive for arthralgias and back pain.  Skin:  Positive for wound.  All other systems reviewed and are negative.   Physical Exam Updated Vital Signs BP (!) 164/90 (BP Location: Right Arm)   Temp 98.6 F (37 C)    Resp 18   Ht 5' 1.5" (1.562 m)   Wt 44 kg   BMI 18.03 kg/m  Physical Exam Vitals and nursing note reviewed.  Constitutional:      General: She is not in acute distress.    Appearance: Normal appearance. She is well-developed. She is not ill-appearing, toxic-appearing or diaphoretic.  HENT:     Head: Normocephalic and atraumatic.     Right Ear: External ear normal.     Left Ear: External ear normal.     Nose: Nose normal.     Mouth/Throat:     Mouth: Mucous membranes are moist.  Eyes:     Extraocular Movements: Extraocular movements intact.     Conjunctiva/sclera: Conjunctivae normal.  Cardiovascular:     Rate and Rhythm: Normal rate and regular rhythm.  Pulmonary:     Effort: Pulmonary effort is normal. No respiratory distress.  Abdominal:     General: There is no distension.     Palpations: Abdomen is soft.     Tenderness: There is no abdominal tenderness.  Musculoskeletal:        General: Signs of injury present. Normal range of motion.     Cervical back: Normal range of motion and neck supple.  Skin:    General: Skin is warm and dry.  Neurological:     General: No focal deficit present.  Mental Status: She is alert and oriented to person, place, and time.  Psychiatric:        Mood and Affect: Mood normal.        Behavior: Behavior normal.        ED Results / Procedures / Treatments   Labs (all labs ordered are listed, but only abnormal results are displayed) Labs Reviewed - No data to display  EKG None  Radiology DG Thoracic Spine 2 View  Result Date: 10/01/2023 CLINICAL DATA:  Fall onto concrete.  Back pain. EXAM: THORACIC SPINE 2 VIEWS COMPARISON:  Chest two views 03/29/2023, 03/27/2023; CT chest CT chest 10/17/2022 FINDINGS: The twelfth rib appears to be non-rib-bearing. Mild dextrocurvature centered at T5 with Cobb angle measuring 11 degrees. Mild levocurvature centered at T11 with Cobb angle measuring 11 degrees. There is diffuse decreased bone  mineralization. There is severe anterior T3 vertebral body height loss that appears to be increased from 03/29/2023 radiograph. This is new from 10/17/2022 CT. Mild anterior height loss of the T6 vertebral body is unchanged. Mild multilevel anterior disc space narrowing of the mid to upper thoracic spine. There is 5 mm grade 1 anterolisthesis of L3 on L4 IMPRESSION: 1. Severe anterior T3 vertebral body height loss that appears to be increased from 03/29/2023 radiograph. This is new from 10/17/2022 CT. Recommend correlation with point tenderness. 2. Mild anterior height loss of the T6 vertebral body is unchanged from 03/29/2023 and chronic. 3. Mild S-shaped scoliosis of the thoracic spine. Electronically Signed   By: Neita Garnet M.D.   On: 10/01/2023 11:40   DG Hand Complete Left  Result Date: 10/01/2023 CLINICAL DATA:  Fall onto concrete while holding onto handles of trash can. Pain. EXAM: LEFT HAND - COMPLETE 3+ VIEW COMPARISON:  None Available. FINDINGS: There is diffuse decreased bone mineralization. 2 mm ulnar positive variance. Moderate radiocarpal joint space narrowing. Severe triscaphe and moderate to severe thumb carpometacarpal joint space narrowing and peripheral osteophytosis. Moderate thumb carpometacarpal subchondral sclerosis. Moderate third DIP and second PIP and mild-to-moderate rest of the interphalangeal joint space narrowing and peripheral osteophytosis. Moderate to severe second metacarpophalangeal joint space narrowing and subchondral sclerosis. No acute fracture or dislocation. IMPRESSION: 1. No acute fracture. 2. Moderate to severe osteoarthritis, greatest within the triscaphe, thumb carpometacarpal, second metacarpophalangeal, third DIP, and second PIP joints. Electronically Signed   By: Neita Garnet M.D.   On: 10/01/2023 11:34   DG Hand Complete Right  Result Date: 10/01/2023 CLINICAL DATA:  Fall onto concrete while holding onto handles a trash can. Finger pain. EXAM: RIGHT HAND  - COMPLETE 3+ VIEW COMPARISON:  None Available. FINDINGS: There is diffuse decreased bone mineralization. Severe triscaphe thumb carpometacarpal joint space narrowing with triscaphe greater than thumb carpometacarpal subchondral sclerosis and thumb carpometacarpal greater than triscaphe peripheral degenerative osteophytosis. Moderate to severe second through fifth PIP, severe second and third DIP, and mild-to-moderate fourth and fifth DIP and thumb interphalangeal joint space narrowing, subchondral sclerosis, and peripheral osteophytosis. Mild to moderate radiocarpal joint space narrowing. 3 mm ulnar positive variance. Mild calcification within the triangular fibrocartilage complex. Moderate third and mild second metacarpophalangeal joint space narrowing. No acute fracture is seen. IMPRESSION: 1. No acute fracture is seen. 2. Severe triscaphe and thumb carpometacarpal osteoarthritis. 3. Moderate to severe second through fifth PIP, severe second and third DIP, and mild-to-moderate fourth and fifth DIP and thumb interphalangeal osteoarthritis. Electronically Signed   By: Neita Garnet M.D.   On: 10/01/2023 11:32    Procedures Procedures  Medications Ordered in ED Medications  acetaminophen (TYLENOL) tablet 650 mg (650 mg Oral Given 10/01/23 1002)  Tdap (BOOSTRIX) injection 0.5 mL (0.5 mLs Intramuscular Given 10/01/23 1024)    ED Course/ Medical Decision Making/ A&P                                 Medical Decision Making Amount and/or Complexity of Data Reviewed Radiology: ordered.  Risk OTC drugs. Prescription drug management.   Patient presents after a fall.  This fall occurred yesterday.  As she fell, she injured her hands on both the lid of a trash can as well as the ground.  She has since had pain to areas of her fingers and pain to mid thoracic back.  On arrival, patient is well-appearing.  Vital signs are normal.  Finger wounds were dressed in bandages.  These were soaked in water and  removed.  Wounds appear to be superficial.  She has maintained range of motion of her fingers.  X-ray imaging was ordered to assess for bony damage.  On palpation of her back, she does have tenderness in area of T6-7.  X-ray of thoracic spine was ordered.  Patient was ordered Tylenol for analgesia.  She did not want anything stronger than that.  X-ray imaging did not show any new findings on hands.  On thoracic spine x-ray, she does have a new anterior shortening of T3.  Of note, she has no pain or tenderness to this area.  There is a chronic T6 vertebral shortening that appears similar to imaging that she had a year ago.  This is where her new pain is.  Given no radiographic findings of change, we will avoid back brace.  Patient's wounds were dressed in Xeroform, gauze, and Coban.  She declined prescriptions for any pain medication.  She was discharged in stable condition.        Final Clinical Impression(s) / ED Diagnoses Final diagnoses:  Fall, initial encounter  Multiple abrasions of finger    Rx / DC Orders ED Discharge Orders     None         Gloris Manchester, MD 10/01/23 1307

## 2023-10-01 NOTE — ED Triage Notes (Signed)
Pt POV w/ family from home c/o injuries from a fall. Pt was pushing a trashcan that rolled away from her, causing her to fall onto concrete while still holding onto the handles. Pt c/o pain in her fingers. Pt presents w/ multiple bandaids on 3 of her fingers. Also c/o mid thoracic back pain. No LOC, No blood thinners

## 2023-10-12 ENCOUNTER — Telehealth: Payer: Self-pay | Admitting: Internal Medicine

## 2023-10-12 NOTE — Telephone Encounter (Signed)
Spoke with patient today. 

## 2023-10-12 NOTE — Telephone Encounter (Signed)
Patient had a fall on 11/25 and wants to know after you read her reports if you think she should travel by car for 6 hours to get to her daughters.  Please call patient and advise. [phone]

## 2023-10-12 NOTE — Telephone Encounter (Signed)
Reviewed ED records.  If she is feeling okay I think it is fine for her to travel.

## 2023-10-23 DIAGNOSIS — H353211 Exudative age-related macular degeneration, right eye, with active choroidal neovascularization: Secondary | ICD-10-CM | POA: Diagnosis not present

## 2023-11-20 DIAGNOSIS — Z961 Presence of intraocular lens: Secondary | ICD-10-CM | POA: Diagnosis not present

## 2023-11-20 DIAGNOSIS — H353122 Nonexudative age-related macular degeneration, left eye, intermediate dry stage: Secondary | ICD-10-CM | POA: Diagnosis not present

## 2023-11-20 DIAGNOSIS — H35372 Puckering of macula, left eye: Secondary | ICD-10-CM | POA: Diagnosis not present

## 2023-11-20 DIAGNOSIS — D3131 Benign neoplasm of right choroid: Secondary | ICD-10-CM | POA: Diagnosis not present

## 2023-11-20 DIAGNOSIS — H353211 Exudative age-related macular degeneration, right eye, with active choroidal neovascularization: Secondary | ICD-10-CM | POA: Diagnosis not present

## 2023-11-20 DIAGNOSIS — H43813 Vitreous degeneration, bilateral: Secondary | ICD-10-CM | POA: Diagnosis not present

## 2023-12-17 DIAGNOSIS — L821 Other seborrheic keratosis: Secondary | ICD-10-CM | POA: Diagnosis not present

## 2023-12-17 DIAGNOSIS — Z85828 Personal history of other malignant neoplasm of skin: Secondary | ICD-10-CM | POA: Diagnosis not present

## 2023-12-17 DIAGNOSIS — D485 Neoplasm of uncertain behavior of skin: Secondary | ICD-10-CM | POA: Diagnosis not present

## 2023-12-17 DIAGNOSIS — L814 Other melanin hyperpigmentation: Secondary | ICD-10-CM | POA: Diagnosis not present

## 2023-12-17 DIAGNOSIS — L570 Actinic keratosis: Secondary | ICD-10-CM | POA: Diagnosis not present

## 2023-12-17 DIAGNOSIS — D2261 Melanocytic nevi of right upper limb, including shoulder: Secondary | ICD-10-CM | POA: Diagnosis not present

## 2023-12-17 DIAGNOSIS — D692 Other nonthrombocytopenic purpura: Secondary | ICD-10-CM | POA: Diagnosis not present

## 2023-12-17 DIAGNOSIS — D1801 Hemangioma of skin and subcutaneous tissue: Secondary | ICD-10-CM | POA: Diagnosis not present

## 2023-12-17 DIAGNOSIS — D2239 Melanocytic nevi of other parts of face: Secondary | ICD-10-CM | POA: Diagnosis not present

## 2023-12-17 DIAGNOSIS — D230 Other benign neoplasm of skin of lip: Secondary | ICD-10-CM | POA: Diagnosis not present

## 2023-12-31 DIAGNOSIS — H353211 Exudative age-related macular degeneration, right eye, with active choroidal neovascularization: Secondary | ICD-10-CM | POA: Diagnosis not present

## 2024-02-25 DIAGNOSIS — H353211 Exudative age-related macular degeneration, right eye, with active choroidal neovascularization: Secondary | ICD-10-CM | POA: Diagnosis not present

## 2024-05-05 DIAGNOSIS — H353211 Exudative age-related macular degeneration, right eye, with active choroidal neovascularization: Secondary | ICD-10-CM | POA: Diagnosis not present

## 2024-05-05 DIAGNOSIS — H35372 Puckering of macula, left eye: Secondary | ICD-10-CM | POA: Diagnosis not present

## 2024-05-05 DIAGNOSIS — D3131 Benign neoplasm of right choroid: Secondary | ICD-10-CM | POA: Diagnosis not present

## 2024-05-05 DIAGNOSIS — H43813 Vitreous degeneration, bilateral: Secondary | ICD-10-CM | POA: Diagnosis not present

## 2024-05-05 DIAGNOSIS — Z961 Presence of intraocular lens: Secondary | ICD-10-CM | POA: Diagnosis not present

## 2024-05-05 DIAGNOSIS — H353122 Nonexudative age-related macular degeneration, left eye, intermediate dry stage: Secondary | ICD-10-CM | POA: Diagnosis not present

## 2024-05-16 ENCOUNTER — Other Ambulatory Visit: Payer: Self-pay | Admitting: Internal Medicine

## 2024-05-19 ENCOUNTER — Telehealth: Payer: Self-pay | Admitting: Cardiovascular Disease

## 2024-05-19 NOTE — Telephone Encounter (Signed)
*  STAT* If patient is at the pharmacy, call can be transferred to refill team.   1. Which medications need to be refilled? (please list name of each medication and dose if known) Flecainide    2. Would you like to learn more about the convenience, safety, & potential cost savings by using the [facility]?    3. Are you open to using the [facility] (Type [facility].    4. Which pharmacy/location (including street and city if local pharmacy) is medication to be sent to?[facility]  [address] [place], [place]   5. Do they need a 30 day or 90 day supply?  Need enough until her appointment on 06-19-24- please call today - out of medicine

## 2024-05-19 NOTE — Telephone Encounter (Signed)
 RX sent in on 05/19/24

## 2024-06-19 ENCOUNTER — Encounter: Payer: Self-pay | Admitting: Internal Medicine

## 2024-06-19 ENCOUNTER — Ambulatory Visit: Attending: Internal Medicine | Admitting: Internal Medicine

## 2024-06-19 VITALS — BP 117/67 | HR 96 | Ht 61.5 in | Wt 95.0 lb

## 2024-06-19 DIAGNOSIS — I493 Ventricular premature depolarization: Secondary | ICD-10-CM | POA: Insufficient documentation

## 2024-06-19 NOTE — Patient Instructions (Signed)

## 2024-06-19 NOTE — Progress Notes (Signed)
 HPI [name] returns today for followup of her PVC's and HTN. She is a pleasant 89 yo woman with a h/o palpitations who has been placed on flecainide . Her PVC's have resolved though we had to increase her dose to 75 bid. She had an echo 2 years ago which was reassuring as was her heart monitor which showed only NS SVT and rare PVCs and PAC's. In the interim she notes she is working in her yard. She has trouble swallowing solid foods. She has lost 5 lbs in the last year. Allergies  Allergen Reactions   Diltiazem  Hcl Other (See Comments)    Rapid heartbeat and elevated blood pressure   Inderal [Propranolol Hcl]     Pt does not remember reaction   Lopressor  [Metoprolol  Tartrate]     Per the pt' I felt like I was going to pass out   Penicillins Diarrhea    That was 50 years ago      Current Outpatient Medications  Medication Sig Dispense Refill   Calcium -Vitamin D -Vitamin K (VIACTIV CALCIUM  PLUS D) 650-12.5-40 MG-MCG-MCG CHEW Chew 1 each by mouth daily.     flecainide  (TAMBOCOR ) 50 MG tablet Take 1.5 tablets (75 mg total) by mouth 2 (two) times daily. 270 tablet 0   Multiple Vitamin (MULTIVITAMIN WITH MINERALS) TABS tablet Take 1 tablet by mouth daily. gummies     Multiple Vitamins-Minerals (PRESERVISION AREDS 2+MULTI VIT PO) Take 1 capsule by mouth in the morning and at bedtime.     No current facility-administered medications for this visit.     Past Medical History:  Diagnosis Date   Arthritis    Atrial fibrillation (HCC)    Bowel obstruction (HCC) 2000   Cataract    bilateral removed   Complication of anesthesia    hard to wake after hysterectomy, states heart stopped during a Esophagoscopy, but states she has had several surgeries and procedures with anesthesia and has never had that again   Dysrhythmia    HLD (hyperlipidemia)    Hypercontractile esophagus    Mitral valve prolapse    Osteoporosis    declines rx and declines DEXA f/u   Parathyroid  adenoma     post parathyroid  surgery in October of 2008   Paroxysmal supraventricular tachycardia (HCC)    Pneumonia    as a child   Premature ventricular contractions (PVCs) (VPCs)    Schatzki's ring 08/2013   Dr. Albertus, s/p dilation 08/2013, ?benefit   Squamous cell carcinoma in situ of skin    face   Urinary incontinence     ROS:   All systems reviewed and negative except as noted in the HPI.   Past Surgical History:  Procedure Laterality Date   ABDOMINAL HYSTERECTOMY  1989   BREAST BIOPSY  1979   left   CATARACT EXTRACTION     bilateral   ESOPHAGEAL MANOMETRY N/A 11/17/2015   Procedure: ESOPHAGEAL MANOMETRY (EM);  Surgeon: Gordy CHRISTELLA Albertus, MD;  Location: [facility];  Service: Gastroenterology;  Laterality: N/A;   ESOPHAGOSCOPY  09/17/2019   Procedure: Cervical Esophagoscopy;  Surgeon: Mable Lenis, MD;  Location: [facility] OR;  Service: ENT;;   EYE SURGERY     LAPAROSCOPIC SMALL BOWEL RESECTION  2000   LARYNGOSCOPY AND ESOPHAGOSCOPY N/A 10/21/2021   Procedure: DIRECT LARYNGOSCOPY AND ESOPHAGOSCOPY WITH DILATION OF ESOPHAGEAL NARROWING;  Surgeon: Mable Lenis, MD;  Location: [facility] OR;  Service: ENT;  Laterality: N/A;   LARYNGOSCOPY AND ESOPHAGOSCOPY N/A 07/26/2022   Procedure: DIRECT  LARYNGOSCOPY; ESOPHAGOSCOPY AND ESOPHAGEAL DILATION;  Surgeon: Mable Lenis, MD;  Location: [facility] OR;  Service: ENT;  Laterality: N/A;   MICROLARYNGOSCOPY WITH DILATION N/A 09/17/2019   Procedure: MICROLARYNGOSCOPY WITH DILATION;  Surgeon: Mable Lenis, MD;  Location: [facility] OR;  Service: ENT;  Laterality: N/A;   parathyroid  surgery  2008?   left   SHOULDER SURGERY  02/1999   right   SKIN LESION EXCISION  08/1999   left side of face   TOTAL HIP ARTHROPLASTY Left 03/23/2023   Procedure: TOTAL HIP ARTHROPLASTY ANTERIOR APPROACH;  Surgeon: Vernetta Lonni GRADE, MD;  Location: [facility];  Service: Orthopedics;  Laterality: Left;     Family History  Problem Relation Age of Onset   Arthritis Father     Heart disease Father    Hypertension Father    Stroke Father    Heart attack Father    Arthritis Mother    Stroke Mother    Stroke Brother    Diabetes Brother    Hypertension Brother    Colon cancer Sister        in her 43's   Hyperlipidemia Sister    Hypertension Sister    Hypertension Sister    Diabetes Sister    Esophageal cancer Neg Hx    Rectal cancer Neg Hx    Stomach cancer Neg Hx      Social History   Socioeconomic History   Marital status: Widowed    Spouse name: Not on file   Number of children: 4   Years of education: 12+   Highest education level: Not on file  Occupational History   Occupation: Retired    Comment: Regulatory affairs officer   Tobacco Use   Smoking status: Never   Smokeless tobacco: Never  Vaping Use   Vaping status: Never Used  Substance and Sexual Activity   Alcohol use: No   Drug use: No   Sexual activity: Not Currently  Other Topics Concern   Not on file  Social History Narrative   Regular exercise-sometimes   Caffeine Use-no   married x 2, caregiver of 98yo husband (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) - retired Teacher, English as a foreign language daughter is now living with her.   Social Drivers of Corporate investment banker Strain: Low Risk  (06/18/2023)   Overall Financial Resource Strain (CARDIA)    Difficulty of Paying Living Expenses: Not hard at all  Food Insecurity: No Food Insecurity (06/18/2023)   Hunger Vital Sign    Worried About Running Out of Food in the Last Year: Never true    Ran Out of Food in the Last Year: Never true  Transportation Needs: No Transportation Needs (06/18/2023)   PRAPARE - Administrator, Civil Service (Medical): No    Lack of Transportation (Non-Medical): No  Physical Activity: Inactive (06/18/2023)   Exercise Vital Sign    Days of Exercise per Week: 0 days    Minutes of Exercise per Session: 0 min  Stress: No Stress Concern Present (06/18/2023)   [employer] of Occupational Health - Occupational Stress  Questionnaire    Feeling of Stress : Not at all  Social Connections: Moderately Integrated (06/18/2023)   Social Connection and Isolation Panel    Frequency of Communication with Friends and Family: More than three times a week    Frequency of Social Gatherings with Friends and Family: Once a week    Attends Religious Services: More than 4 times per year    Active Member of [employer]  or Organizations: Yes    Attends Banker Meetings: 1 to 4 times per year    Marital Status: Widowed  Intimate Partner Violence: Not At Risk (06/18/2023)   Humiliation, Afraid, Rape, and Kick questionnaire    Fear of Current or Ex-Partner: No    Emotionally Abused: No    Physically Abused: No    Sexually Abused: No     BP 117/67   Pulse 96   Ht 5' 1.5 (1.562 m)   Wt 95 lb (43.1 kg)   SpO2 96%   BMI 17.66 kg/m   Physical Exam:  Well appearing elderly woman, NAD HEENT: Unremarkable Neck:  No JVD, no thyromegally Lymphatics:  No adenopathy Back:  No CVA tenderness Lungs:  Clear with no wheezes HEART:  Regular rate rhythm, no murmurs, no rubs, no clicks Abd:  soft, positive bowel sounds, no organomegally, no rebound, no guarding Ext:  2 plus pulses, no edema, no cyanosis, no clubbing Skin:  No rashes no nodules Neuro:  CN II through XII intact, motor grossly intact  EKG - nsr    Assess/Plan:  PVC's - she is still asymptomatic. We will continue her flecainide . 2. HTN - her bp is well controlled. She will continue her current meds.  3. Dysphagia - her weight is down 5 lbs and I asked her to eat full fat foods. She will undergo watchful waiting. 4. Dyspnea - her symptoms have improved and she is working in her yard which I recommended she continue.   Danelle Benjermin Korber,MD

## 2024-06-26 ENCOUNTER — Ambulatory Visit

## 2024-06-26 VITALS — Ht 61.0 in | Wt 94.0 lb

## 2024-06-26 DIAGNOSIS — Z0000 Encounter for general adult medical examination without abnormal findings: Secondary | ICD-10-CM

## 2024-06-26 NOTE — Patient Instructions (Addendum)
 Ms. Whiteman , Thank you for taking time out of your busy schedule to complete your Annual Wellness Visit with me. I enjoyed our conversation and look forward to speaking with you again next year. I, as well as your care team,  appreciate your ongoing commitment to your health goals. Please review the following plan we discussed and let me know if I can assist you in the future. Your Game plan/ To Do List    Referrals: If you haven't heard from the office you've been referred to, please reach out to them at the phone provided.   Follow up Visits: We will see or speak with you next year for your Next Medicare AWV with our clinical staff Have you seen your provider in the last 6 months (3 months if uncontrolled diabetes)? Yes  Clinician Recommendations:  Aim for 30 minutes of exercise or brisk walking, 6-8 glasses of water , and 5 servings of fruits and vegetables each day. Educated and advised on getting the Shingles vaccines.       This is a list of the screenings recommended for you:  Health Maintenance  Topic Date Due   Zoster (Shingles) Vaccine (1 of 2) 10/18/1948   Flu Shot  06/06/2024   Medicare Annual Wellness Visit  06/26/2025   DTaP/Tdap/Td vaccine (3 - Td or Tdap) 09/30/2033   Pneumococcal Vaccine for age over 9  Completed   HPV Vaccine  Aged Out   Meningitis B Vaccine  Aged Out   DEXA scan (bone density measurement)  Discontinued   COVID-19 Vaccine  Discontinued    Advanced directives: (Copy Requested) Please bring a copy of your health care power of attorney and living will to the office to be added to your chart at your convenience. You can mail to [facility] [address] [place], [place] [address] or email to [email or web address] Advance Care Planning is important because it:  [x]  Makes sure you receive the medical care that is consistent with your values, goals, and preferences  [x]  It provides guidance to your family and loved ones and reduces  their decisional burden about whether or not they are making the right decisions based on your wishes.  Follow the link provided in your after visit summary or read over the paperwork we have mailed to you to help you started getting your Advance Directives in place. If you need assistance in completing these, please reach out to us  so that we can help you!

## 2024-06-26 NOTE — Progress Notes (Signed)
 Subjective:   Michelle Vazquez is a 89 y.o. who presents for a Medicare Wellness preventive visit.  As a reminder, Annual Wellness Visits don't include a physical exam, and some assessments may be limited, especially if this visit is performed virtually. We may recommend an in-person follow-up visit with your provider if needed.  Visit Complete: Virtual I connected with  Michelle Vazquez on 06/26/24 by a audio enabled telemedicine application and verified that I am speaking with the correct person using two identifiers.  Patient Location: Home  Provider Location: Office/Clinic  I discussed the limitations of evaluation and management by telemedicine. The patient expressed understanding and agreed to proceed.  Vital Signs: Because this visit was a virtual/telehealth visit, some criteria may be missing or patient reported. Any vitals not documented were not able to be obtained and vitals that have been documented are patient reported.  VideoDeclined- This patient declined Librarian, academic. Therefore the visit was completed with audio only.  Persons Participating in Visit: Patient.  AWV Questionnaire: No: Patient Medicare AWV questionnaire was not completed prior to this visit.  Cardiac Risk Factors include: advanced age (>67men, >102 women);dyslipidemia;Other (see comment), Risk factor comments: A. Fib; CKD     Objective:    Today's Vitals   06/26/24 0853  Weight: 94 lb (42.6 kg)  Height: 5' 1 (1.549 m)   Body mass index is 17.76 kg/m.     06/26/2024    8:53 AM 10/01/2023    9:52 AM 06/18/2023   11:30 AM 03/27/2023    1:00 PM 03/23/2023    1:57 PM 03/21/2023    9:00 PM 03/21/2023    1:36 PM  Advanced Directives  Does Patient Have a Medical Advance Directive? Yes Yes;No Yes Yes Yes Yes Yes  Type of Estate agent of [place];Living will  Healthcare Power of [place];Living will Healthcare Power of [employer] Power of  [employer] Power of [employer] Power of Attorney  Does patient want to make changes to medical advance directive?    No - Patient declined No - Patient declined No - Patient declined No - Patient declined  Copy of Healthcare Power of Attorney in Chart? No - copy requested  No - copy requested No - copy requested No - copy requested No - copy requested No - copy requested    Current Medications (verified) Outpatient Encounter Medications as of 06/26/2024  Medication Sig   Calcium -Vitamin D -Vitamin K (VIACTIV CALCIUM  PLUS D) 650-12.5-40 MG-MCG-MCG CHEW Chew 1 each by mouth daily.   flecainide  (TAMBOCOR ) 50 MG tablet Take 1.5 tablets (75 mg total) by mouth 2 (two) times daily.   Multiple Vitamin (MULTIVITAMIN WITH MINERALS) TABS tablet Take 1 tablet by mouth daily. gummies   Multiple Vitamins-Minerals (PRESERVISION AREDS 2+MULTI VIT PO) Take 1 capsule by mouth in the morning and at bedtime.   No facility-administered encounter medications on file as of 06/26/2024.    Allergies (verified) Diltiazem  hcl, Inderal [propranolol hcl], Lopressor  [metoprolol  tartrate], and Penicillins   History: Past Medical History:  Diagnosis Date   Arthritis    Atrial fibrillation (HCC)    Bowel obstruction (HCC) 2000   Cataract    bilateral removed   Complication of anesthesia    hard to wake after hysterectomy, states heart stopped during a Esophagoscopy, but states she has had several surgeries and procedures with anesthesia and has never had that again   Dysrhythmia    HLD (hyperlipidemia)    Hypercontractile esophagus  Mitral valve prolapse    Osteoporosis    declines rx and declines DEXA f/u   Parathyroid  adenoma    post parathyroid  surgery in October of 2008   Paroxysmal supraventricular tachycardia (HCC)    Pneumonia    as a child   Premature ventricular contractions (PVCs) (VPCs)    Schatzki's ring 08/2013   Dr. Albertus, s/p dilation 08/2013, ?benefit   Squamous cell  carcinoma in situ of skin    face   Urinary incontinence    Past Surgical History:  Procedure Laterality Date   ABDOMINAL HYSTERECTOMY  1989   BREAST BIOPSY  1979   left   CATARACT EXTRACTION     bilateral   ESOPHAGEAL MANOMETRY N/A 11/17/2015   Procedure: ESOPHAGEAL MANOMETRY (EM);  Surgeon: Gordy CHRISTELLA Albertus, MD;  Location: [facility];  Service: Gastroenterology;  Laterality: N/A;   ESOPHAGOSCOPY  09/17/2019   Procedure: Cervical Esophagoscopy;  Surgeon: Mable Lenis, MD;  Location: [facility] OR;  Service: ENT;;   EYE SURGERY     LAPAROSCOPIC SMALL BOWEL RESECTION  2000   LARYNGOSCOPY AND ESOPHAGOSCOPY N/A 10/21/2021   Procedure: DIRECT LARYNGOSCOPY AND ESOPHAGOSCOPY WITH DILATION OF ESOPHAGEAL NARROWING;  Surgeon: Mable Lenis, MD;  Location: [facility];  Service: ENT;  Laterality: N/A;   LARYNGOSCOPY AND ESOPHAGOSCOPY N/A 07/26/2022   Procedure: DIRECT LARYNGOSCOPY; ESOPHAGOSCOPY AND ESOPHAGEAL DILATION;  Surgeon: Mable Lenis, MD;  Location: [facility] OR;  Service: ENT;  Laterality: N/A;   MICROLARYNGOSCOPY WITH DILATION N/A 09/17/2019   Procedure: MICROLARYNGOSCOPY WITH DILATION;  Surgeon: Mable Lenis, MD;  Location: [facility] OR;  Service: ENT;  Laterality: N/A;   parathyroid  surgery  2008?   left   SHOULDER SURGERY  02/1999   right   SKIN LESION EXCISION  08/1999   left side of face   TOTAL HIP ARTHROPLASTY Left 03/23/2023   Procedure: TOTAL HIP ARTHROPLASTY ANTERIOR APPROACH;  Surgeon: Vernetta Lonni GRADE, MD;  Location: [facility];  Service: Orthopedics;  Laterality: Left;   Family History  Problem Relation Age of Onset   Arthritis Father    Heart disease Father    Hypertension Father    Stroke Father    Heart attack Father    Arthritis Mother    Stroke Mother    Stroke Brother    Diabetes Brother    Hypertension Brother    Colon cancer Sister        in her 47's   Hyperlipidemia Sister    Hypertension Sister    Hypertension Sister    Diabetes Sister    Esophageal cancer  Neg Hx    Rectal cancer Neg Hx    Stomach cancer Neg Hx    Social History   Socioeconomic History   Marital status: Widowed    Spouse name: Not on file   Number of children: 4   Years of education: 12+   Highest education level: Not on file  Occupational History   Occupation: Retired    Comment: Regulatory affairs officer   Tobacco Use   Smoking status: Never   Smokeless tobacco: Never  Vaping Use   Vaping status: Never Used  Substance and Sexual Activity   Alcohol use: No   Drug use: No   Sexual activity: Not Currently  Other Topics Concern   Not on file  Social History Narrative   Regular exercise-sometimes   Caffeine Use-no   married x 2, caregiver of 98yo husband (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) - retired Teacher, English as a foreign language daughter is now living with  her.   Social Drivers of Health   Financial Resource Strain: Low Risk  (06/26/2024)   Overall Financial Resource Strain (CARDIA)    Difficulty of Paying Living Expenses: Not hard at all  Food Insecurity: No Food Insecurity (06/26/2024)   Hunger Vital Sign    Worried About Running Out of Food in the Last Year: Never true    Ran Out of Food in the Last Year: Never true  Transportation Needs: No Transportation Needs (06/26/2024)   PRAPARE - Administrator, Civil Service (Medical): No    Lack of Transportation (Non-Medical): No  Physical Activity: Inactive (06/26/2024)   Exercise Vital Sign    Days of Exercise per Week: 0 days    Minutes of Exercise per Session: 0 min  Stress: No Stress Concern Present (06/26/2024)   [employer] of Occupational Health - Occupational Stress Questionnaire    Feeling of Stress: Not at all  Social Connections: Moderately Integrated (06/26/2024)   Social Connection and Isolation Panel    Frequency of Communication with Friends and Family: More than three times a week    Frequency of Social Gatherings with Friends and Family: Once a week    Attends Religious Services: More than 4 times per year     Active Member of [employer] or Organizations: Yes    Attends Banker Meetings: 1 to 4 times per year    Marital Status: Widowed    Tobacco Counseling Counseling given: No    Clinical Intake:  Pre-visit preparation completed: Yes  Pain : No/denies pain     BMI - recorded: 17.76 Nutritional Status: BMI <19  Underweight Nutritional Risks: None Diabetes: No  Lab Results  Component Value Date   HGBA1C 5.6 09/18/2023   HGBA1C 5.6 09/13/2022   HGBA1C 5.7 09/12/2021     How often do you need to have someone help you when you read instructions, pamphlets, or other written materials from your doctor or pharmacy?: 1 - Never  Interpreter Needed?: No  Information entered by :: Verdie Saba, CMA   Activities of Daily Living     06/26/2024    8:56 AM  In your present state of health, do you have any difficulty performing the following activities:  Hearing? 0  Vision? 0  Difficulty concentrating or making decisions? 0  Walking or climbing stairs? 0  Dressing or bathing? 0  Doing errands, shopping? 0  Preparing Food and eating ? N  Using the Toilet? N  In the past six months, have you accidently leaked urine? Y  Comment wears a pad  Do you have problems with loss of bowel control? N  Managing your Medications? N  Managing your Finances? N  Housekeeping or managing your Housekeeping? N    Patient Care Team: Geofm Glade PARAS, MD as PCP - General (Internal Medicine) Waddell Danelle ORN, MD as PCP - Electrophysiology (Cardiology) Pyrtle, Gordy HERO, MD (Gastroenterology) Waddell Danelle ORN, MD (Cardiology) [facility] as Referring Physician (Ophthalmology) Regenia, Prentice Clack, MD as Consulting Physician (Ophthalmology)  I have updated your Care Teams any recent Medical Services you may have received from other providers in the past year.     Assessment:   This is a routine wellness examination for Michelle Vazquez.  Hearing/Vision screen Hearing Screening  - Comments:: Denies hearing difficulties   Vision Screening - Comments:: Wears rx glasses - up to date with routine eye exams with Prentice Regenia   Goals Addressed  This Visit's Progress     Patient Stated (pt-stated)        Patient stated she plans to gain more weight        Depression Screen     06/26/2024    8:58 AM 06/18/2023   11:37 AM 05/28/2023    1:55 PM 09/13/2022    9:23 AM 06/16/2022    8:52 AM 06/13/2021   11:24 AM 09/08/2020    8:16 AM  PHQ 2/9 Scores  PHQ - 2 Score 0 0 0 0 0 0 0  PHQ- 9 Score 3 6 6         Fall Risk     06/26/2024    8:57 AM 06/18/2023   11:30 AM 09/13/2022    9:23 AM 06/16/2022    8:52 AM 06/13/2021   11:21 AM  Fall Risk   Falls in the past year? 0 1 0 0 0  Number falls in past yr: 0 0 0 0 0  Injury with Fall? 0 1 0 0 0  Risk for fall due to : No Fall Risks;History of fall(s) Other (Comment) No Fall Risks No Fall Risks No Fall Risks  Risk for fall due to: Comment  Tripped over vacum cord     Follow up Falls evaluation completed;Falls prevention discussed Falls evaluation completed;Falls prevention discussed Falls evaluation completed  Falls evaluation completed  Falls evaluation completed      Data saved with a previous flowsheet row definition    MEDICARE RISK AT HOME:  Medicare Risk at Home Any stairs in or around the home?: No If so, are there any without handrails?: No Home free of loose throw rugs in walkways, pet beds, electrical cords, etc?: Yes Adequate lighting in your home to reduce risk of falls?: Yes Life alert?: No Use of a cane, walker or w/c?: No Grab bars in the bathroom?: Yes Shower chair or bench in shower?: Yes Elevated toilet seat or a handicapped toilet?: Yes  TIMED UP AND GO:  Was the test performed?  No  Cognitive Function: 6CIT completed        06/26/2024    9:01 AM 06/18/2023   11:33 AM 06/16/2022    9:03 AM  6CIT Screen  What Year? 0 points 0 points 0 points  What month? 0 points 0 points 0  points  What time? 0 points 0 points 0 points  Count back from 20 0 points 0 points 0 points  Months in reverse 0 points 0 points 0 points  Repeat phrase 0 points 0 points 0 points  Total Score 0 points 0 points 0 points    Immunizations Immunization History  Administered Date(s) Administered   Fluad Quad(high Dose 65+) 09/08/2019, 09/08/2020, 09/12/2021   Fluad Trivalent(High Dose 65+) 09/18/2023   Influenza, High Dose Seasonal PF 08/04/2014, 08/28/2016, 09/04/2017, 09/06/2018   Influenza,inj,Quad PF,6+ Mos 08/04/2013, 08/25/2015   PFIZER(Purple Top)SARS-COV-2 Vaccination 11/27/2019, 12/18/2019, 08/25/2020   PNEUMOCOCCAL CONJUGATE-20 09/12/2021   Pneumococcal Conjugate-13 08/25/2015   Pneumococcal Polysaccharide-23 06/21/2012   Td 11/06/2009   Tdap 10/01/2023   Zoster, Live 06/30/2011    Screening Tests Health Maintenance  Topic Date Due   Zoster Vaccines- Shingrix (1 of 2) 10/18/1948   INFLUENZA VACCINE  06/06/2024   Medicare Annual Wellness (AWV)  06/26/2025   DTaP/Tdap/Td (3 - Td or Tdap) 09/30/2033   Pneumococcal Vaccine: 50+ Years  Completed   HPV VACCINES  Aged Out   Meningococcal B Vaccine  Aged Out   DEXA SCAN  Discontinued  COVID-19 Vaccine  Discontinued    Health Maintenance  Health Maintenance Due  Topic Date Due   Zoster Vaccines- Shingrix (1 of 2) 10/18/1950   INFLUENZA VACCINE  06/06/2024   Health Maintenance Items Addressed:06/26/2024   Additional Screening:  Vision Screening: Recommended annual ophthalmology exams for early detection of glaucoma and other disorders of the eye. Would you like a referral to an eye doctor? No    Dental Screening: Recommended annual dental exams for proper oral hygiene  Community Resource Referral / Chronic Care Management: CRR required this visit?  No   CCM required this visit?  No   Plan:    I have personally reviewed and noted the following in the patient's chart:   Medical and social history Use of  alcohol, tobacco or illicit drugs  Current medications and supplements including opioid prescriptions. Patient is not currently taking opioid prescriptions. Functional ability and status Nutritional status Physical activity Advanced directives List of other physicians Hospitalizations, surgeries, and ER visits in previous 12 months Vitals Screenings to include cognitive, depression, and falls Referrals and appointments  In addition, I have reviewed and discussed with patient certain preventive protocols, quality metrics, and best practice recommendations. A written personalized care plan for preventive services as well as general preventive health recommendations were provided to patient.   Verdie CHRISTELLA Saba, CMA   06/26/2024   After Visit Summary: (MyChart) Due to this being a telephonic visit, the after visit summary with patients personalized plan was offered to patient via MyChart   Notes: Pt c/o losing weight.  Scheduled an appt for pt w/PCP for this Monday with PCP to discuss (06/30/24)

## 2024-06-29 ENCOUNTER — Encounter: Payer: Self-pay | Admitting: Internal Medicine

## 2024-06-29 NOTE — Patient Instructions (Incomplete)
    Work on increasing your calorie intake as we discussed.      Monitor your feet.

## 2024-06-29 NOTE — Progress Notes (Unsigned)
    Subjective:    Patient ID: Michelle Vazquez, female    DOB: 08-Oct-1929, 89 y.o.   MRN: [id number]      HPI Avril is here for No chief complaint on file.        Medications and allergies reviewed with patient and updated if appropriate.  Current Outpatient Medications on File Prior to Visit  Medication Sig Dispense Refill   Calcium -Vitamin D -Vitamin K (VIACTIV CALCIUM  PLUS D) 650-12.5-40 MG-MCG-MCG CHEW Chew 1 each by mouth daily.     flecainide  (TAMBOCOR ) 50 MG tablet Take 1.5 tablets (75 mg total) by mouth 2 (two) times daily. 270 tablet 0   Multiple Vitamin (MULTIVITAMIN WITH MINERALS) TABS tablet Take 1 tablet by mouth daily. gummies     Multiple Vitamins-Minerals (PRESERVISION AREDS 2+MULTI VIT PO) Take 1 capsule by mouth in the morning and at bedtime.     No current facility-administered medications on file prior to visit.    Review of Systems     Objective:  There were no vitals filed for this visit. BP Readings from Last 3 Encounters:  06/19/24 117/67  10/01/23 (!) 146/91  09/18/23 106/68   Wt Readings from Last 3 Encounters:  06/26/24 94 lb (42.6 kg)  06/19/24 95 lb (43.1 kg)  10/01/23 97 lb (44 kg)   There is no height or weight on file to calculate BMI.    Physical Exam         Assessment & Plan:    See Problem List for Assessment and Plan of chronic medical problems.

## 2024-06-30 ENCOUNTER — Ambulatory Visit: Admitting: Internal Medicine

## 2024-06-30 VITALS — BP 106/72 | HR 105 | Temp 98.1°F | Ht 61.0 in | Wt 96.0 lb

## 2024-06-30 DIAGNOSIS — R1314 Dysphagia, pharyngoesophageal phase: Secondary | ICD-10-CM | POA: Diagnosis not present

## 2024-06-30 DIAGNOSIS — K222 Esophageal obstruction: Secondary | ICD-10-CM | POA: Diagnosis not present

## 2024-06-30 DIAGNOSIS — R0989 Other specified symptoms and signs involving the circulatory and respiratory systems: Secondary | ICD-10-CM

## 2024-06-30 DIAGNOSIS — R636 Underweight: Secondary | ICD-10-CM

## 2024-06-30 NOTE — Assessment & Plan Note (Signed)
 Slight decreased pulses bilateral dorsalis pedis Her toes are cool to touch and she does state purpleish discoloration to the end of day Discussed possible vascular referral-she deferred for now and we will just monitor

## 2024-06-30 NOTE — Assessment & Plan Note (Signed)
 Chronic Was seeing Dr. Saintclair and had her esophagus stretched periodically Last time was stretch there was no improvement On pured diet/liquid diet  She is concerned about her weight because she did go down to 93 and weight has gone up a little bit Encouraged her to eat as many calories as possible any calorie dense foods Continue protein powder and smoothies Add heavy cream, whole milk, sour cream, cheese, peanut butter, avocado to what ever meal she is able to Encouraged her to try to eat just a little bit more or try to snack if possible Deferred nutrition referral at this time Follow-up in November

## 2024-06-30 NOTE — Assessment & Plan Note (Signed)
 Chronic Struggles to maintain her weight because of her esophageal disease Discussed trying to increase her calorie intake by adding certain things into her meals-high fat foods-cheese, sour cream, heavy cream, full fat milk, avocado, peanut butter Discussed nutrition referral-deferred for today Follow-up in November at her regularly scheduled appointment

## 2024-06-30 NOTE — Assessment & Plan Note (Signed)
 Chronic Esophageal dysmotility and hypercontractile esophagus and esophageal stricture Significant difficulty eating most foods-on pure/liquid diet Discussed ways to increase calorie intake

## 2024-07-21 DIAGNOSIS — H353131 Nonexudative age-related macular degeneration, bilateral, early dry stage: Secondary | ICD-10-CM | POA: Diagnosis not present

## 2024-07-28 DIAGNOSIS — H353211 Exudative age-related macular degeneration, right eye, with active choroidal neovascularization: Secondary | ICD-10-CM | POA: Diagnosis not present

## 2024-08-21 ENCOUNTER — Telehealth: Payer: Self-pay | Admitting: Internal Medicine

## 2024-08-21 MED ORDER — FLECAINIDE ACETATE 50 MG PO TABS: 75.0000 mg | ORAL_TABLET | Freq: Two times a day (BID) | ORAL | 3 refills | Status: AC

## 2024-08-21 NOTE — Telephone Encounter (Signed)
 RX sent in

## 2024-08-21 NOTE — Telephone Encounter (Signed)
*  STAT* If patient is at the pharmacy, call can be transferred to refill team.   1. Which medications need to be refilled? (please list name of each medication and dose if known) flecainide  (TAMBOCOR ) 50 MG tablet    2. Would you like to learn more about the convenience, safety, & potential cost savings by using the [facility]?     3. Are you open to using the [facility] (Type [facility]. ).   4. Which pharmacy/location (including street and city if local pharmacy) is medication to be sent to? [facility] [address], [place] - [address]    5. Do they need a 30 day or 90 day supply? 90 day

## 2024-09-08 ENCOUNTER — Encounter: Payer: Self-pay | Admitting: Radiology

## 2024-09-21 ENCOUNTER — Encounter: Payer: Self-pay | Admitting: Internal Medicine

## 2024-09-21 NOTE — Patient Instructions (Addendum)
      Blood work was ordered.       Medications changes include :   None    A referral was ordered for [facility] and someone will call you to schedule an appointment.     Return in about 1 year (around 09/24/2025) for follow up.

## 2024-09-21 NOTE — Progress Notes (Unsigned)
 Subjective:    Patient ID: Michelle Vazquez, female    DOB: Mar 08, 1929, 89 y.o.   MRN: [id number]     HPI Michelle Vazquez is here for follow up of her chronic medical problems.  She continues to have much difficulty swallowing and is very limited with what she can eat.  She has not been able to gain weight and think she has lost a little weight.  She does yard work and house work - no other exercise.    Medications and allergies reviewed with patient and updated if appropriate.  Current Outpatient Medications on File Prior to Visit  Medication Sig Dispense Refill   Calcium -Vitamin D -Vitamin K (VIACTIV CALCIUM  PLUS D) 650-12.5-40 MG-MCG-MCG CHEW Chew 1 each by mouth daily.     flecainide  (TAMBOCOR ) 50 MG tablet Take 1.5 tablets (75 mg total) by mouth 2 (two) times daily. 270 tablet 3   Multiple Vitamin (MULTIVITAMIN WITH MINERALS) TABS tablet Take 1 tablet by mouth daily. gummies     Multiple Vitamins-Minerals (PRESERVISION AREDS 2+MULTI VIT PO) Take 1 capsule by mouth in the morning and at bedtime.     No current facility-administered medications on file prior to visit.     Review of Systems  Constitutional:  Negative for fever.  HENT:  Positive for trouble swallowing.   Respiratory:  Positive for cough (chronic, wet - after she lays down). Negative for shortness of breath and wheezing.   Cardiovascular:  Positive for palpitations (HR up to 110). Negative for chest pain and leg swelling.  Gastrointestinal:  Negative for abdominal pain, constipation, diarrhea and nausea.  Neurological:  Negative for light-headedness and headaches.       Objective:   Vitals:   09/24/24 1000  BP: 108/74  Pulse: 95  Temp: 98 F (36.7 C)  SpO2: 100%   BP Readings from Last 3 Encounters:  09/24/24 108/74  06/30/24 106/72  06/19/24 117/67   Wt Readings from Last 3 Encounters:  09/24/24 94 lb (42.6 kg)  06/30/24 96 lb (43.5 kg)  06/26/24 94 lb (42.6 kg)   Body mass index is 17.76  kg/m.    Physical Exam Constitutional:      General: She is not in acute distress.    Appearance: Normal appearance.     Comments: Temporal wasting  HENT:     Head: Normocephalic and atraumatic.  Eyes:     Conjunctiva/sclera: Conjunctivae normal.  Cardiovascular:     Rate and Rhythm: Normal rate and regular rhythm.     Heart sounds: Normal heart sounds.  Pulmonary:     Effort: Pulmonary effort is normal. No respiratory distress.     Breath sounds: Normal breath sounds. No wheezing.  Musculoskeletal:     Cervical back: Neck supple.     Right lower leg: No edema.     Left lower leg: No edema.  Lymphadenopathy:     Cervical: No cervical adenopathy.  Skin:    General: Skin is warm and dry.     Findings: No rash.  Neurological:     Mental Status: She is alert. Mental status is at baseline.  Psychiatric:        Mood and Affect: Mood normal.        Behavior: Behavior normal.        Lab Results  Component Value Date   WBC 5.4 09/18/2023   HGB 14.4 09/18/2023   HCT 42.2 09/18/2023   PLT 271.0 09/18/2023   GLUCOSE 94 09/18/2023  CHOL 164 09/18/2023   TRIG 106.0 09/18/2023   HDL 61.80 09/18/2023   LDLDIRECT 130.1 06/24/2013   LDLCALC 81 09/18/2023   ALT 19 09/18/2023   AST 23 09/18/2023   NA 141 09/18/2023   K 4.6 09/18/2023   CL 99 09/18/2023   CREATININE 0.97 09/18/2023   BUN 26 (H) 09/18/2023   CO2 31 09/18/2023   TSH 1.11 09/13/2022   HGBA1C 5.6 09/18/2023     Assessment & Plan:   Flu vaccine given  See Problem List for Assessment and Plan of chronic medical problems.    I personally spent a total of 20 minutes in the care of the patient today including performing a medically appropriate exam/evaluation, counseling and educating, placing orders, and documenting clinical information in the EHR.

## 2024-09-24 ENCOUNTER — Ambulatory Visit: Admitting: Internal Medicine

## 2024-09-24 ENCOUNTER — Ambulatory Visit: Payer: Medicare Other | Admitting: Internal Medicine

## 2024-09-24 VITALS — BP 108/74 | HR 95 | Temp 98.0°F | Ht 61.0 in | Wt 94.0 lb

## 2024-09-24 DIAGNOSIS — M810 Age-related osteoporosis without current pathological fracture: Secondary | ICD-10-CM | POA: Diagnosis not present

## 2024-09-24 DIAGNOSIS — E7800 Pure hypercholesterolemia, unspecified: Secondary | ICD-10-CM

## 2024-09-24 DIAGNOSIS — I480 Paroxysmal atrial fibrillation: Secondary | ICD-10-CM

## 2024-09-24 DIAGNOSIS — E43 Unspecified severe protein-calorie malnutrition: Secondary | ICD-10-CM | POA: Diagnosis not present

## 2024-09-24 DIAGNOSIS — R1314 Dysphagia, pharyngoesophageal phase: Secondary | ICD-10-CM | POA: Diagnosis not present

## 2024-09-24 DIAGNOSIS — N1831 Chronic kidney disease, stage 3a: Secondary | ICD-10-CM

## 2024-09-24 DIAGNOSIS — Z23 Encounter for immunization: Secondary | ICD-10-CM

## 2024-09-24 LAB — TSH: TSH: 1.15 u[IU]/mL (ref 0.35–5.50)

## 2024-09-24 LAB — CBC WITH DIFFERENTIAL/PLATELET
Basophils Absolute: 0 K/uL (ref 0.0–0.1)
Basophils Relative: 0.5 % (ref 0.0–3.0)
Eosinophils Absolute: 0.1 K/uL (ref 0.0–0.7)
Eosinophils Relative: 1.1 % (ref 0.0–5.0)
HCT: 39.2 % (ref 36.0–46.0)
Hemoglobin: 13.2 g/dL (ref 12.0–15.0)
Lymphocytes Relative: 26.8 % (ref 12.0–46.0)
Lymphs Abs: 1.5 K/uL (ref 0.7–4.0)
MCHC: 33.8 g/dL (ref 30.0–36.0)
MCV: 93.6 fl (ref 78.0–100.0)
Monocytes Absolute: 0.6 K/uL (ref 0.1–1.0)
Monocytes Relative: 11.2 % (ref 3.0–12.0)
Neutro Abs: 3.3 K/uL (ref 1.4–7.7)
Neutrophils Relative %: 60.4 % (ref 43.0–77.0)
Platelets: 301 K/uL (ref 150.0–400.0)
RBC: 4.19 Mil/uL (ref 3.87–5.11)
RDW: 13.3 % (ref 11.5–15.5)
WBC: 5.4 K/uL (ref 4.0–10.5)

## 2024-09-24 LAB — LIPID PANEL
Cholesterol: 166 mg/dL (ref 0–200)
HDL: 65.2 mg/dL (ref >39.00)
LDL Cholesterol: 87 mg/dL (ref 0–99)
NonHDL: 100.94
Total CHOL/HDL Ratio: 3
Triglycerides: 69 mg/dL (ref 0.0–149.0)
VLDL: 13.8 mg/dL (ref 0.0–40.0)

## 2024-09-24 LAB — COMPREHENSIVE METABOLIC PANEL WITH GFR
ALT: 9 U/L (ref 0–35)
AST: 18 U/L (ref 0–37)
Albumin: 3.8 g/dL (ref 3.5–5.2)
Alkaline Phosphatase: 57 U/L (ref 39–117)
BUN: 27 mg/dL — ABNORMAL HIGH (ref 6–23)
CO2: 33 meq/L — ABNORMAL HIGH (ref 19–32)
Calcium: 9.5 mg/dL (ref 8.4–10.5)
Chloride: 98 meq/L (ref 96–112)
Creatinine, Ser: 0.91 mg/dL (ref 0.40–1.20)
GFR: 54.6 mL/min — ABNORMAL LOW (ref >60.00)
Glucose, Bld: 95 mg/dL (ref 70–99)
Potassium: 4.5 meq/L (ref 3.5–5.1)
Sodium: 137 meq/L (ref 135–145)
Total Bilirubin: 0.4 mg/dL (ref 0.2–1.2)
Total Protein: 7.5 g/dL (ref 6.0–8.3)

## 2024-09-24 LAB — VITAMIN B12: Vitamin B-12: 815 pg/mL (ref 211–911)

## 2024-09-24 LAB — VITAMIN D 25 HYDROXY (VIT D DEFICIENCY, FRACTURES): VITD: 55.13 ng/mL (ref 30.00–100.00)

## 2024-09-24 NOTE — Assessment & Plan Note (Signed)
 Chronic Check lipid panel, CMP, and TSH Continue lifestyle control

## 2024-09-24 NOTE — Assessment & Plan Note (Addendum)
 Chronic Esophageal dysmotility and hypercontractile esophagus and esophageal stricture Significant difficulty eating most foods-on pure/liquid diet She is using whole milk and adds cream to soups.  She is trying to increase her calories and eating I calorie foods, but is still not able to gain weight.  Gave her some suggestions ENT was doing dilations, but when he retired she was not able to find someone that was able to do what he was doing Has already been to GI, but I think she should see a different GI for second opinion-referred

## 2024-09-24 NOTE — Assessment & Plan Note (Signed)
 Chronic She is doing the best she can do on a pure/liquid diet, but has not been able to increase her weight-not able to eat enough protein or calories Check B12, vitamin D , CBC, CMP Discussed other ways of getting more calories in Continue to focus on protein intake

## 2024-09-24 NOTE — Assessment & Plan Note (Signed)
 Chronic Following with cardiology On flecainide 75 mg twice daily CBC, CMP

## 2024-09-24 NOTE — Addendum Note (Signed)
 Addended by: GEOFM GLADE PARAS on: 09/24/2024 12:34 PM   Modules accepted: Level of Service

## 2024-09-24 NOTE — Addendum Note (Signed)
 Addended by: CLAUDENE TOBIAS PARAS on: 09/24/2024 02:34 PM   Modules accepted: Orders

## 2024-09-24 NOTE — Assessment & Plan Note (Addendum)
 Chronic History of hip fracture Continue calcium  and vitamin D , multivitamin check vitamin D  level Fall prevention discussed Discussed Prolia-Will do

## 2024-09-24 NOTE — Assessment & Plan Note (Signed)
 Chronic Stage 3a Stable CBC, CMP, vitamin D  level

## 2024-09-25 ENCOUNTER — Ambulatory Visit: Payer: Self-pay | Admitting: Internal Medicine

## 2024-09-29 DIAGNOSIS — I4891 Unspecified atrial fibrillation: Secondary | ICD-10-CM | POA: Diagnosis not present

## 2024-09-29 DIAGNOSIS — K224 Dyskinesia of esophagus: Secondary | ICD-10-CM | POA: Diagnosis not present

## 2024-09-29 DIAGNOSIS — R1310 Dysphagia, unspecified: Secondary | ICD-10-CM | POA: Diagnosis not present

## 2024-09-30 ENCOUNTER — Other Ambulatory Visit (HOSPITAL_COMMUNITY): Payer: Self-pay | Admitting: Student

## 2024-09-30 ENCOUNTER — Other Ambulatory Visit: Payer: Self-pay | Admitting: Student

## 2024-09-30 DIAGNOSIS — K224 Dyskinesia of esophagus: Secondary | ICD-10-CM

## 2024-09-30 DIAGNOSIS — R1310 Dysphagia, unspecified: Secondary | ICD-10-CM

## 2024-10-10 ENCOUNTER — Ambulatory Visit (HOSPITAL_COMMUNITY)
Admission: RE | Admit: 2024-10-10 | Discharge: 2024-10-10 | Disposition: A | Source: Ambulatory Visit | Attending: Student

## 2024-10-10 DIAGNOSIS — K225 Diverticulum of esophagus, acquired: Secondary | ICD-10-CM | POA: Diagnosis not present

## 2024-10-10 DIAGNOSIS — K224 Dyskinesia of esophagus: Secondary | ICD-10-CM | POA: Diagnosis not present

## 2024-10-10 DIAGNOSIS — R1310 Dysphagia, unspecified: Secondary | ICD-10-CM | POA: Diagnosis not present

## 2024-10-10 DIAGNOSIS — R059 Cough, unspecified: Secondary | ICD-10-CM | POA: Diagnosis not present

## 2024-10-15 ENCOUNTER — Telehealth (HOSPITAL_COMMUNITY): Payer: Self-pay

## 2024-10-15 ENCOUNTER — Telehealth (INDEPENDENT_AMBULATORY_CARE_PROVIDER_SITE_OTHER): Payer: Self-pay | Admitting: Otolaryngology

## 2024-10-15 NOTE — Telephone Encounter (Signed)
 Sched pt with first available complaining about throat and unable to swallow and eat wants sooner please advise

## 2024-10-15 NOTE — Telephone Encounter (Signed)
 Whitney from [facility] left voicemail advising to cancel referral for the OP MBS as it is no longer needed. The ordering provider was Jacueline Thigpen.

## 2024-10-16 NOTE — Telephone Encounter (Signed)
 Called patient to let her know that all the Doctors are booked up but that we have her on a cancellation list so if there is a cancellation and we can get the patient in sooner we would give her a call. Patient had a couple questions about the appointment, I let the patient know about how the appointment would go. Patient understood.

## 2024-11-11 ENCOUNTER — Encounter (INDEPENDENT_AMBULATORY_CARE_PROVIDER_SITE_OTHER): Payer: Self-pay | Admitting: Otolaryngology

## 2024-11-11 ENCOUNTER — Ambulatory Visit (INDEPENDENT_AMBULATORY_CARE_PROVIDER_SITE_OTHER): Admitting: Otolaryngology

## 2024-11-11 VITALS — BP 125/76 | HR 96 | Temp 98.2°F | Ht 61.0 in | Wt 93.0 lb

## 2024-11-11 DIAGNOSIS — K225 Diverticulum of esophagus, acquired: Secondary | ICD-10-CM

## 2024-11-11 NOTE — Progress Notes (Signed)
 Reason for Consult: Zenker's diverticulum Referring Physician: Dr. Thigpen  Michelle Vazquez is an 89 y.o. female.  HPI: She is here for evaluation of dysphagia and aspiration.  She has had a dysphagia problem for years.  She has had previous esophageal dilatation by Dr. Mable and by GI.  She has had improvement with each dilation except the last 1.  They were progressively less benefit.  She has had a barium swallow that shows aspiration with thin liquids and she has had that problem for years.  She has noticed a 2-month worsening where she now can hardly take anything but it is liquid without coughing.  She takes thickened material and does okay with that.  She has seen speech therapy.  Her recent barium swallow shows a Zenker's diverticulum of moderate size.  Past Medical History:  Diagnosis Date   Arthritis    Atrial fibrillation (HCC)    Bowel obstruction (HCC) 2000   Cataract    bilateral removed   Complication of anesthesia    hard to wake after hysterectomy, states heart stopped during a Esophagoscopy, but states she has had several surgeries and procedures with anesthesia and has never had that again   Dysrhythmia    HLD (hyperlipidemia)    Hypercontractile esophagus    Mitral valve prolapse    Osteoporosis    declines rx and declines DEXA f/u   Parathyroid  adenoma    post parathyroid  surgery in October of 2008   Paroxysmal supraventricular tachycardia    Pneumonia    as a child   Premature ventricular contractions (PVCs) (VPCs)    Schatzki's ring 08/2013   Dr. Albertus, s/p dilation 08/2013, ?benefit   Squamous cell carcinoma in situ of skin    face   Urinary incontinence     Past Surgical History:  Procedure Laterality Date   ABDOMINAL HYSTERECTOMY  1989   BREAST BIOPSY  1979   left   CATARACT EXTRACTION     bilateral   ESOPHAGEAL MANOMETRY N/A 11/17/2015   Procedure: ESOPHAGEAL MANOMETRY (EM);  Surgeon: Gordy CHRISTELLA Albertus, MD;  Location: [facility];  Service:  Gastroenterology;  Laterality: N/A;   ESOPHAGOSCOPY  09/17/2019   Procedure: Cervical Esophagoscopy;  Surgeon: Mable Lenis, MD;  Location: [facility] OR;  Service: ENT;;   EYE SURGERY     LAPAROSCOPIC SMALL BOWEL RESECTION  2000   LARYNGOSCOPY AND ESOPHAGOSCOPY N/A 10/21/2021   Procedure: DIRECT LARYNGOSCOPY AND ESOPHAGOSCOPY WITH DILATION OF ESOPHAGEAL NARROWING;  Surgeon: Mable Lenis, MD;  Location: [facility] OR;  Service: ENT;  Laterality: N/A;   LARYNGOSCOPY AND ESOPHAGOSCOPY N/A 07/26/2022   Procedure: DIRECT LARYNGOSCOPY; ESOPHAGOSCOPY AND ESOPHAGEAL DILATION;  Surgeon: Mable Lenis, MD;  Location: [facility] OR;  Service: ENT;  Laterality: N/A;   MICROLARYNGOSCOPY WITH DILATION N/A 09/17/2019   Procedure: MICROLARYNGOSCOPY WITH DILATION;  Surgeon: Mable Lenis, MD;  Location: [facility] OR;  Service: ENT;  Laterality: N/A;   parathyroid  surgery  2008?   left   SHOULDER SURGERY  02/1999   right   SKIN LESION EXCISION  08/1999   left side of face   TOTAL HIP ARTHROPLASTY Left 03/23/2023   Procedure: TOTAL HIP ARTHROPLASTY ANTERIOR APPROACH;  Surgeon: Vernetta Lonni GRADE, MD;  Location: [facility];  Service: Orthopedics;  Laterality: Left;    Family History  Problem Relation Age of Onset   Arthritis Father    Heart disease Father    Hypertension Father    Stroke Father    Heart attack Father    Arthritis  Mother    Stroke Mother    Stroke Brother    Diabetes Brother    Hypertension Brother    Colon cancer Sister        in her 78's   Hyperlipidemia Sister    Hypertension Sister    Hypertension Sister    Diabetes Sister    Esophageal cancer Neg Hx    Rectal cancer Neg Hx    Stomach cancer Neg Hx     Social History:  reports that she has never smoked. She has never used smokeless tobacco. She reports that she does not drink alcohol and does not use drugs.  Allergies: Allergies[1]   No results found for this or any previous visit (from the past 48 hours).  No results  found.  ROS Blood pressure 125/76, pulse 96, temperature 98.2 F (36.8 C), height 5' 1 (1.549 m), weight 93 lb (42.2 kg), SpO2 93%. Physical Exam Constitutional:      Appearance: Normal appearance.  HENT:     Head: Normocephalic and atraumatic.     Right Ear: Tympanic membrane is without lesions and middle ear aerated, ear canal and external ear normal.     Left Ear: Tympanic membrane is without lesions and middle ear aerated, ear canal and external ear normal.     Nose: Nose without deviation of septum.  Turbinates with mild hypertrophy, No significant swelling or masses.     Oral cavity/oropharynx: Mucous membranes are moist. No lesions or masses    Larynx: normal voice. Mirror attempted without success    Eyes:     Extraocular Movements: Extraocular movements intact.     Conjunctiva/sclera: Conjunctivae normal.     Pupils: Pupils are equal, round, and reactive to light.  Cardiovascular:     Rate and Rhythm: Normal rate.  Pulmonary:     Effort: Pulmonary effort is normal.  Musculoskeletal:     Cervical back: Normal range of motion and neck supple. No rigidity.  Lymphadenopathy:     Cervical: No cervical adenopathy or masses.salivary glands without lesions. .     Salivary glands- no mass or swelling Neurological:     Mental Status: He is alert. CN 2-12 intact. No nystagmus      Assessment/Plan: Zenker's diverticulum-we talked about the options of dilation again, Botox injection, and diverticulectomy.  It probably would be ill-advised to dilate her again as she did not get any benefit out of the last 1 and minimal benefit from the 1 prior to that.  There is a risk of perforation.  She actually has seen Dr. At  [facility] and they recommended a serial dilation of progressively larger dilation..  After discussion she has decided she wants to proceed with a diverticulectomy.  I will need to refer her to one of our doctors here that does that procedure and we will call her to have  another appointment to discuss the details.  Turns out that no one in our practice nor [facility] does this procedure consistently so I am going to refer her to [facility] for care.  Norleen Notice 11/11/2024, 11:32 AM         [1]  Allergies Allergen Reactions   Diltiazem  Hcl Other (See Comments)    Rapid heartbeat and elevated blood pressure   Inderal [Propranolol Hcl]     Pt does not remember reaction   Lopressor  [Metoprolol  Tartrate]     Per the pt' I felt like I was going to pass out   Penicillins Diarrhea  That was 50 years ago

## 2024-11-12 ENCOUNTER — Telehealth (INDEPENDENT_AMBULATORY_CARE_PROVIDER_SITE_OTHER): Payer: Self-pay | Admitting: Otolaryngology

## 2024-11-12 NOTE — Addendum Note (Signed)
 Addended by: ROARK NORLEEN HERO on: 11/12/2024 08:05 AM   Modules accepted: Orders

## 2024-11-12 NOTE — Telephone Encounter (Signed)
 I discussed with her that fact that there is no doctor in [place] doing the endoscopic approach consistently so I referred her to [facility] and informed her of this.  She should expect a referral call in about a week and if not we will call us  back.

## 2024-11-18 ENCOUNTER — Telehealth (INDEPENDENT_AMBULATORY_CARE_PROVIDER_SITE_OTHER): Payer: Self-pay | Admitting: Otolaryngology

## 2024-11-18 NOTE — Telephone Encounter (Signed)
 The patient called in checking on her referral from Dr Roark to Dr Franchot Silvan at [facility].  They did not reach out to the patient because she already sees a different provider in that office.  I gave her the phone number to that office for her to request to switch doctors, but we have no control over the policies in that office.

## 2024-11-19 ENCOUNTER — Encounter (INDEPENDENT_AMBULATORY_CARE_PROVIDER_SITE_OTHER): Payer: Self-pay

## 2024-11-19 ENCOUNTER — Telehealth (INDEPENDENT_AMBULATORY_CARE_PROVIDER_SITE_OTHER): Payer: Self-pay | Admitting: Otolaryngology

## 2024-11-19 NOTE — Telephone Encounter (Signed)
 The patient called in requesting a call from Dr Roark. The [place] office told the patient that we need to call in the referral for her to see Dr Garnette Franchot Silvan.  The office gave phone number  [phone] and they told the patient that our office needs to call that number to set up the referral. The office told the patient they do not accept faxed referrals. See the existing referral for previous notes on this matter.

## 2025-06-30 ENCOUNTER — Ambulatory Visit

## 2025-09-28 ENCOUNTER — Ambulatory Visit: Admitting: Internal Medicine
